# Patient Record
Sex: Female | Born: 1940 | Race: White | Hispanic: No | Smoking: Former smoker
Health system: Southern US, Community
[De-identification: ages and names within clinical notes are randomized; demographics above are authoritative.]

## PROBLEM LIST (undated history)

## (undated) DIAGNOSIS — IMO0001 Reserved for inherently not codable concepts without codable children: Secondary | ICD-10-CM

## (undated) DIAGNOSIS — J449 Chronic obstructive pulmonary disease, unspecified: Secondary | ICD-10-CM

## (undated) DIAGNOSIS — E785 Hyperlipidemia, unspecified: Secondary | ICD-10-CM

## (undated) DIAGNOSIS — I10 Essential (primary) hypertension: Secondary | ICD-10-CM

## (undated) DIAGNOSIS — IMO0002 Reserved for concepts with insufficient information to code with codable children: Secondary | ICD-10-CM

## (undated) DIAGNOSIS — I739 Peripheral vascular disease, unspecified: Secondary | ICD-10-CM

## (undated) DIAGNOSIS — I5022 Chronic systolic (congestive) heart failure: Secondary | ICD-10-CM

## (undated) DIAGNOSIS — R7611 Nonspecific reaction to tuberculin skin test without active tuberculosis: Secondary | ICD-10-CM

## (undated) DIAGNOSIS — E119 Type 2 diabetes mellitus without complications: Secondary | ICD-10-CM

## (undated) DIAGNOSIS — R911 Solitary pulmonary nodule: Secondary | ICD-10-CM

## (undated) DIAGNOSIS — I2510 Atherosclerotic heart disease of native coronary artery without angina pectoris: Secondary | ICD-10-CM

## (undated) DIAGNOSIS — H269 Unspecified cataract: Secondary | ICD-10-CM

## (undated) DIAGNOSIS — C801 Malignant (primary) neoplasm, unspecified: Secondary | ICD-10-CM

## (undated) DIAGNOSIS — E875 Hyperkalemia: Secondary | ICD-10-CM

## (undated) DIAGNOSIS — C3490 Malignant neoplasm of unspecified part of unspecified bronchus or lung: Secondary | ICD-10-CM

## (undated) DIAGNOSIS — D494 Neoplasm of unspecified behavior of bladder: Secondary | ICD-10-CM

## (undated) DIAGNOSIS — Z973 Presence of spectacles and contact lenses: Secondary | ICD-10-CM

## (undated) DIAGNOSIS — Z8719 Personal history of other diseases of the digestive system: Secondary | ICD-10-CM

## (undated) DIAGNOSIS — I255 Ischemic cardiomyopathy: Secondary | ICD-10-CM

## (undated) DIAGNOSIS — C679 Malignant neoplasm of bladder, unspecified: Secondary | ICD-10-CM

## (undated) HISTORY — DX: Chronic systolic (congestive) heart failure: I50.22

## (undated) HISTORY — DX: Ischemic cardiomyopathy: I25.5

## (undated) HISTORY — DX: Atherosclerotic heart disease of native coronary artery without angina pectoris: I25.10

## (undated) HISTORY — DX: Solitary pulmonary nodule: R91.1

## (undated) HISTORY — PX: WRIST SURGERY: SHX841

## (undated) HISTORY — PX: ABDOMINAL HYSTERECTOMY: SHX81

## (undated) HISTORY — DX: Chronic obstructive pulmonary disease, unspecified: J44.9

## (undated) HISTORY — PX: TONSILLECTOMY: SUR1361

## (undated) HISTORY — PX: OTHER SURGICAL HISTORY: SHX169

## (undated) HISTORY — DX: Hyperlipidemia, unspecified: E78.5

## (undated) HISTORY — DX: Reserved for inherently not codable concepts without codable children: IMO0001

## (undated) HISTORY — DX: Hyperkalemia: E87.5

## (undated) HISTORY — DX: Malignant (primary) neoplasm, unspecified: C80.1

## (undated) HISTORY — DX: Malignant neoplasm of bladder, unspecified: C67.9

## (undated) HISTORY — PX: CHOLECYSTECTOMY: SHX55

## (undated) HISTORY — DX: Peripheral vascular disease, unspecified: I73.9

## (undated) HISTORY — DX: Reserved for concepts with insufficient information to code with codable children: IMO0002

## (undated) HISTORY — DX: Nonspecific reaction to tuberculin skin test without active tuberculosis: R76.11

## (undated) HISTORY — PX: HAMMER TOE SURGERY: SHX385

## (undated) HISTORY — PX: APPENDECTOMY: SHX54

## (undated) HISTORY — PX: HIATAL HERNIA REPAIR: SHX195

## (undated) HISTORY — PX: CARDIAC CATHETERIZATION: SHX172

## (undated) HISTORY — PX: COLONOSCOPY: SHX174

---
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
## 1991-02-24 HISTORY — PX: LUMBAR DISC SURGERY: SHX700

## 2000-11-08 ENCOUNTER — Emergency Department (HOSPITAL_COMMUNITY): Admission: EM | Admit: 2000-11-08 | Discharge: 2000-11-08 | Payer: Self-pay | Admitting: *Deleted

## 2000-11-17 ENCOUNTER — Ambulatory Visit (HOSPITAL_COMMUNITY): Admission: RE | Admit: 2000-11-17 | Discharge: 2000-11-17 | Payer: Self-pay | Admitting: Internal Medicine

## 2000-11-17 ENCOUNTER — Encounter: Payer: Self-pay | Admitting: Internal Medicine

## 2001-02-25 ENCOUNTER — Encounter: Payer: Self-pay | Admitting: Podiatry

## 2001-02-28 ENCOUNTER — Ambulatory Visit (HOSPITAL_COMMUNITY): Admission: RE | Admit: 2001-02-28 | Discharge: 2001-02-28 | Payer: Self-pay | Admitting: Podiatry

## 2011-10-02 ENCOUNTER — Encounter: Payer: Self-pay | Admitting: Internal Medicine

## 2011-10-02 DIAGNOSIS — Z0000 Encounter for general adult medical examination without abnormal findings: Secondary | ICD-10-CM | POA: Insufficient documentation

## 2011-10-07 ENCOUNTER — Other Ambulatory Visit: Payer: Self-pay | Admitting: Internal Medicine

## 2011-10-07 ENCOUNTER — Ambulatory Visit (INDEPENDENT_AMBULATORY_CARE_PROVIDER_SITE_OTHER)
Admission: RE | Admit: 2011-10-07 | Discharge: 2011-10-07 | Disposition: A | Discharge status: Home / Self Care | Payer: Medicare Other | Source: Ambulatory Visit | Attending: Internal Medicine | Admitting: Internal Medicine

## 2011-10-07 ENCOUNTER — Encounter: Payer: Self-pay | Admitting: Internal Medicine

## 2011-10-07 ENCOUNTER — Other Ambulatory Visit (INDEPENDENT_AMBULATORY_CARE_PROVIDER_SITE_OTHER): Payer: Medicare Other

## 2011-10-07 ENCOUNTER — Ambulatory Visit (INDEPENDENT_AMBULATORY_CARE_PROVIDER_SITE_OTHER): Payer: Medicare Other | Admitting: Internal Medicine

## 2011-10-07 VITALS — BP 140/88 | HR 81 | Temp 97.4°F | Ht 62.0 in | Wt 122.2 lb

## 2011-10-07 DIAGNOSIS — J309 Allergic rhinitis, unspecified: Secondary | ICD-10-CM | POA: Insufficient documentation

## 2011-10-07 DIAGNOSIS — R059 Cough, unspecified: Secondary | ICD-10-CM

## 2011-10-07 DIAGNOSIS — Z0000 Encounter for general adult medical examination without abnormal findings: Secondary | ICD-10-CM

## 2011-10-07 DIAGNOSIS — J449 Chronic obstructive pulmonary disease, unspecified: Secondary | ICD-10-CM | POA: Insufficient documentation

## 2011-10-07 DIAGNOSIS — F17200 Nicotine dependence, unspecified, uncomplicated: Secondary | ICD-10-CM

## 2011-10-07 DIAGNOSIS — R7611 Nonspecific reaction to tuberculin skin test without active tuberculosis: Secondary | ICD-10-CM | POA: Insufficient documentation

## 2011-10-07 DIAGNOSIS — Z87891 Personal history of nicotine dependence: Secondary | ICD-10-CM | POA: Insufficient documentation

## 2011-10-07 DIAGNOSIS — IMO0001 Reserved for inherently not codable concepts without codable children: Secondary | ICD-10-CM

## 2011-10-07 DIAGNOSIS — Z23 Encounter for immunization: Secondary | ICD-10-CM

## 2011-10-07 DIAGNOSIS — R05 Cough: Secondary | ICD-10-CM

## 2011-10-07 LAB — CBC WITH DIFFERENTIAL/PLATELET
Basophils Absolute: 0.1 10*3/uL (ref 0.0–0.1)
Eosinophils Relative: 1.6 % (ref 0.0–5.0)
HCT: 46.2 % — ABNORMAL HIGH (ref 36.0–46.0)
Lymphocytes Relative: 31.1 % (ref 12.0–46.0)
Monocytes Relative: 6.6 % (ref 3.0–12.0)
Neutrophils Relative %: 60.2 % (ref 43.0–77.0)
Platelets: 265 10*3/uL (ref 150.0–400.0)
RDW: 13.9 % (ref 11.5–14.6)
WBC: 13.8 10*3/uL — ABNORMAL HIGH (ref 4.5–10.5)

## 2011-10-07 LAB — HEPATIC FUNCTION PANEL
ALT: 12 U/L (ref 0–35)
AST: 21 U/L (ref 0–37)
Albumin: 4.2 g/dL (ref 3.5–5.2)
Total Protein: 8 g/dL (ref 6.0–8.3)

## 2011-10-07 LAB — URINALYSIS, ROUTINE W REFLEX MICROSCOPIC
Ketones, ur: NEGATIVE
Leukocytes, UA: NEGATIVE
Specific Gravity, Urine: 1.01 (ref 1.000–1.030)
Urobilinogen, UA: 0.2 (ref 0.0–1.0)

## 2011-10-07 LAB — BASIC METABOLIC PANEL
BUN: 15 mg/dL (ref 6–23)
CO2: 28 mEq/L (ref 19–32)
GFR: 90.39 mL/min (ref >60.00)
Glucose, Bld: 173 mg/dL — ABNORMAL HIGH (ref 70–99)
Potassium: 4.8 mEq/L (ref 3.5–5.1)

## 2011-10-07 LAB — TSH: TSH: 2.8 u[IU]/mL (ref 0.35–5.50)

## 2011-10-07 LAB — LIPID PANEL: HDL: 55.4 mg/dL (ref >39.00)

## 2011-10-07 MED ORDER — METFORMIN HCL 500 MG PO TABS: 500.0000 mg | ORAL_TABLET | Freq: Two times a day (BID) | ORAL | Status: DC

## 2011-10-07 MED ORDER — AZITHROMYCIN 250 MG PO TABS: ORAL_TABLET | ORAL | Status: AC

## 2011-10-07 MED ORDER — ASPIRIN 81 MG PO TBEC: 81.0000 mg | DELAYED_RELEASE_TABLET | Freq: Every day | ORAL | Status: DC

## 2011-10-07 NOTE — Patient Instructions (Addendum)
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  Otherwise, you will receive a letter about your results with an explanation. Please return in 1 year for your yearly visit, or sooner if needed, with Lab testing done 3-5 days before

## 2011-10-07 NOTE — Assessment & Plan Note (Signed)

## 2011-10-07 NOTE — Progress Notes (Signed)
Subjective:    Patient ID: Tonya Valdez, female    DOB: 1940-06-12, 71 y.o.   MRN: [id number]  HPI  Here for wellness and f/u;  Overall doing ok;  Pt denies CP, worsening SOB, DOE, wheezing, orthopnea, PND, worsening LE edema, palpitations, dizziness or syncope.  Pt denies neurological change such as new Headache, facial or extremity weakness.  Pt denies polydipsia, polyuria, or low sugar symptoms. Pt states overall good compliance with treatment and medications, good tolerability, and trying to follow lower cholesterol diet.  Pt denies worsening depressive symptoms, suicidal ideation or panic. No fever, wt loss, night sweats, loss of appetite, or other constitutional symptoms.  Pt states good ability with ADL's, low fall risk, home safety reviewed and adequate, no significant changes in hearing or vision, and occasionally active with exercise.  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  Mother had MI, has seen [facility]. Past Medical History  Diagnosis Date  . Positive TB test   . Allergy    Past Surgical History  Procedure Date  . Left foot surgury jan 2003    podiatry  . Gall bladder 1964  . Appendectomy 1964  . Tonsillectomy 1959  . Back surgery 1993  . Hiatal hernia repair 1964    reports that she has been smoking.  She has never used smokeless tobacco. She reports that she drinks alcohol. She reports that she does not use illicit drugs. family history includes Cancer in her other; Diabetes in her other; Heart disease in her other; Hypertension in her other; and Kidney disease in her other. No Known Allergies No current outpatient prescriptions on file prior to visit.   Review of Systems Review of Systems  Constitutional: Negative for diaphoresis, activity change, appetite change and unexpected weight change.  HENT: Negative for hearing loss, ear pain, facial swelling, mouth sores and neck stiffness.     Eyes: Negative for pain, redness and visual disturbance.  Respiratory: Negative for shortness of breath and wheezing.   Cardiovascular: Negative for chest pain and palpitations.  Gastrointestinal: Negative for diarrhea, blood in stool, abdominal distention and rectal pain.  Genitourinary: Negative for hematuria, flank pain and decreased urine volume.  Musculoskeletal: Negative for myalgias and joint swelling.  Skin: Negative for color change and wound.  Neurological: Negative for syncope and numbness.  Hematological: Negative for adenopathy.  Psychiatric/Behavioral: Negative for hallucinations, self-injury, decreased concentration and agitation.     Objective:   Physical Exam BP 140/88  Pulse 81  Temp 97.4 F (36.3 C) (Oral)  Ht 5\' 2"  (1.575 m)  Wt 122 lb 4 oz (55.452 kg)  BMI 22.36 kg/m2  SpO2 96% Physical Exam  VS noted Constitutional: Pt is oriented to person, place, and time. Appears well-developed and well-nourished.  Head: Normocephalic and atraumatic.  Right Ear: External ear normal.  Left Ear: External ear normal.  Nose: Nose normal.  Mouth/Throat: Oropharynx is clear and moist.  Eyes: Conjunctivae and EOM are normal. Pupils are equal, round, and reactive to light.  Neck: Normal range of motion. Neck supple. No JVD present. No tracheal deviation present.  Cardiovascular: Normal rate, regular rhythm, normal heart sounds and intact distal pulses.   Pulmonary/Chest: Effort normal and breath sounds normal.  Abdominal: Soft. Bowel sounds are normal. There is no tenderness.  Musculoskeletal: Normal range of motion. Exhibits no edema.  Lymphadenopathy:  Has no cervical adenopathy.  Neurological: Pt is alert and oriented to  person, place, and time. Pt has normal reflexes. No cranial nerve deficit. Motor/dtr/gait intact Skin: Skin is warm and dry. No rash noted.  Psychiatric:  Has  normal mood and affect. Behavior is normal.     Assessment & Plan:

## 2011-10-07 NOTE — Assessment & Plan Note (Signed)
?   Related to alleriges and post nasal gtt? - for allegra otc prn, but also cxr today

## 2011-10-10 ENCOUNTER — Encounter: Payer: Self-pay | Admitting: Internal Medicine

## 2011-10-10 DIAGNOSIS — E119 Type 2 diabetes mellitus without complications: Secondary | ICD-10-CM | POA: Insufficient documentation

## 2011-10-10 NOTE — Assessment & Plan Note (Signed)
Urged to quit 

## 2011-10-16 ENCOUNTER — Telehealth: Payer: Self-pay | Admitting: Internal Medicine

## 2011-10-16 NOTE — Telephone Encounter (Signed)
Called the patient back and she wanted to  to go somewhere else for Diabetic education classes. INformed would be ok as long as she goes to the classes.

## 2011-11-04 ENCOUNTER — Telehealth: Payer: Self-pay | Admitting: Internal Medicine

## 2011-11-04 ENCOUNTER — Encounter: Payer: Medicare Other | Attending: Internal Medicine | Admitting: Dietician

## 2011-11-04 ENCOUNTER — Encounter: Payer: Self-pay | Admitting: Gastroenterology

## 2011-11-04 ENCOUNTER — Other Ambulatory Visit: Payer: Self-pay

## 2011-11-04 ENCOUNTER — Encounter: Payer: Self-pay | Admitting: Dietician

## 2011-11-04 VITALS — Ht 62.0 in | Wt 116.6 lb

## 2011-11-04 DIAGNOSIS — E119 Type 2 diabetes mellitus without complications: Secondary | ICD-10-CM | POA: Insufficient documentation

## 2011-11-04 DIAGNOSIS — Z713 Dietary counseling and surveillance: Secondary | ICD-10-CM | POA: Insufficient documentation

## 2011-11-04 MED ORDER — ACCU-CHEK FASTCLIX LANCETS MISC: 1.0000 | Freq: Four times a day (QID) | Status: DC

## 2011-11-04 MED ORDER — GLUCOSE BLOOD VI STRP: ORAL_STRIP | Status: DC

## 2011-11-04 MED ORDER — ACCU-CHEK SMARTVIEW CONTROL VI LIQD: 1.0000 | Status: DC | PRN

## 2011-11-04 NOTE — Progress Notes (Signed)
  Medical Nutrition Therapy:  Appt start time: 0800 end time:  0900.   Assessment:  Primary concerns today: New onset DM type 2.  Is concerned that she had no S/S of the DM until her lab work came back.  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 Enjoys her work and continues to enjoy traveling when possible. Wants to monitor blood glucose levels.  Will approach MD for this process.  Provided an Accu-Chek Smartview meter kit : lot=[id number] EXP=06/04/2014 with the Accu-Chek Softclix lancets/lancing device LOT= [id number] EXP= 2017/02. Has seen an RD in [facility], [place] who has placed her on a carb restricted diet.  Diet prescription was for 30-45 gm of CHO for meals and 15 gm for snacks and a calorie level at 1500 calories.  Of concern to me is the fact that she is losing weight.  Today she is at 116.6 lb with a loss of 5.6 lb since her MD appointment on 10/07/2011.  She notes that her cholesterol is slightly elevated and she is about limiting her fat intake.  She is missing some calories and at her recall she  is most days probably at 1200-1300 calories.  Her current  BMI is at 21.4 and I would like to see her stay there.  MEDICATIONS: Medication review completed   DIETARY INTAKE:  Usual eating pattern includes generally 3 meals and 1-2 snacks per day.  Everyday foods include meats, vegetables, fruits, some starchse.  Avoided foods include concentrated sweets.    24-hr recall: Work Day  B ( AM): 11:00 Coffee then frozen pancakes and sugar free syrup, OR bacon, 1-2 slice and egg beaters, and a pancake.   Snk ( AM): 1:00 =/- snack   L ( PM): 2: 30 maybe meal, chicken salad crackers 5 or veggies ( bussel sprouts or broccoli, or sweet potato or baked potato.  or chef salad. Snk ( PM): 12:00 popcorn, individual bag 100 calories or pack of the Lance crackers D ( PM): 6:3:00 shrimp, fried, salad part of baked potato and 2 biscuits.  Water Snk (  PM): 4:00 popcorn (rest) or fruit (apple/orange/1/2 of banana) aiming for 15 gm. Beverages: coffee, water, diet soda. On the day off will have the 3 meal pattern and breakfast will be an omelett, and maybe grits for the breakfast meal.   Usual physical activity: No set pattern for the exercise.  Estimated energy needs:  HT: 62 in  WT: 116.6 lb  BMI:  21.4 kg/m2   1500 calories 30-45 gm per meal carbohydrates 110-115 g protein 40-42 g fat  Progress Towards Goal(s):  In progress.   Nutritional Diagnosis:  [place]-2.1 Inpaired nutrition utilization As related to glucose.  As evidenced by diagnosis of type 2 diabetes with A1C at 8.3%..    Intervention:  Nutrition brief review of nutrition and reinforcement of previous content..  Handouts given during visit include:  Living Well with Diabetes  Controlling Blood Glucose  Monitoring/Evaluation:  Dietary intake, exercise, blood glucose levels, and body weight in 8-12 weeks.  She is to call with questions and for follow-up appointment.

## 2011-11-04 NOTE — Telephone Encounter (Signed)
Patient informed prescriptions requested have been sent in. 

## 2011-11-04 NOTE — Telephone Encounter (Signed)
Needs rx called into pharmacy, lancets for Accu-chek softclix and needs the solution for the meter. Please call into [facility]

## 2011-11-06 ENCOUNTER — Telehealth: Payer: Self-pay | Admitting: Internal Medicine

## 2011-11-06 NOTE — Telephone Encounter (Signed)
Faxed rx request to pharmacy.

## 2011-11-06 NOTE — Telephone Encounter (Signed)
The pt called the triage line hoping to get a shingles rx sent to [facility]. Thanks!

## 2011-11-06 NOTE — Telephone Encounter (Signed)
Ok - rx done hardcopy to [facility]

## 2011-11-30 ENCOUNTER — Ambulatory Visit (AMBULATORY_SURGERY_CENTER): Payer: Medicare Other | Admitting: *Deleted

## 2011-11-30 VITALS — Ht 62.0 in | Wt 116.2 lb

## 2011-11-30 DIAGNOSIS — Z1211 Encounter for screening for malignant neoplasm of colon: Secondary | ICD-10-CM

## 2011-11-30 MED ORDER — MOVIPREP 100 G PO SOLR: ORAL | Status: DC

## 2011-12-14 ENCOUNTER — Encounter: Payer: Medicare Other | Admitting: Gastroenterology

## 2011-12-21 ENCOUNTER — Encounter: Payer: Medicare Other | Admitting: Gastroenterology

## 2011-12-30 ENCOUNTER — Encounter: Payer: Medicare Other | Admitting: Gastroenterology

## 2012-01-01 ENCOUNTER — Ambulatory Visit (AMBULATORY_SURGERY_CENTER): Payer: Medicare Other | Admitting: Gastroenterology

## 2012-01-01 ENCOUNTER — Encounter: Payer: Self-pay | Admitting: Gastroenterology

## 2012-01-01 VITALS — BP 157/62 | HR 66 | Temp 96.7°F | Resp 14 | Ht 62.0 in | Wt 116.0 lb

## 2012-01-01 DIAGNOSIS — Z1211 Encounter for screening for malignant neoplasm of colon: Secondary | ICD-10-CM

## 2012-01-01 DIAGNOSIS — K5730 Diverticulosis of large intestine without perforation or abscess without bleeding: Secondary | ICD-10-CM

## 2012-01-01 DIAGNOSIS — D126 Benign neoplasm of colon, unspecified: Secondary | ICD-10-CM

## 2012-01-01 MED ORDER — SODIUM CHLORIDE 0.9 % IV SOLN: 500.0000 mL | INTRAVENOUS | Status: DC

## 2012-01-01 NOTE — Patient Instructions (Addendum)
YOU HAD AN ENDOSCOPIC PROCEDURE TODAY AT THE [facility]: Refer to the procedure report that was given to you for any specific questions about what was found during the examination.  If the procedure report does not answer your questions, please call your gastroenterologist to clarify.  If you requested that your care partner not be given the details of your procedure findings, then the procedure report has been included in a sealed envelope for you to review at your convenience later.  YOU SHOULD EXPECT: Some feelings of bloating in the abdomen. Passage of more gas than usual.  Walking can help get rid of the air that was put into your GI tract during the procedure and reduce the bloating. If you had a lower endoscopy (such as a colonoscopy or flexible sigmoidoscopy) you may notice spotting of blood in your stool or on the toilet paper. If you underwent a bowel prep for your procedure, then you may not have a normal bowel movement for a few days.  DIET: Your first meal following the procedure should be a light meal and then it is ok to progress to your normal diet.  A half-sandwich or bowl of soup is an example of a good first meal.  Heavy or fried foods are harder to digest and may make you feel nauseous or bloated.  Likewise meals heavy in dairy and vegetables can cause extra gas to form and this can also increase the bloating.  Drink plenty of fluids but you should avoid alcoholic beverages for 24 hours.  ACTIVITY: Your care partner should take you home directly after the procedure.  You should plan to take it easy, moving slowly for the rest of the day.  You can resume normal activity the day after the procedure however you should NOT DRIVE or use heavy machinery for 24 hours (because of the sedation medicines used during the test).    SYMPTOMS TO REPORT IMMEDIATELY: A gastroenterologist can be reached at any hour.  During normal business hours, 8:30 AM to 5:00 PM Monday through Friday,  call [phone].  After hours and on weekends, please call the GI answering service at [phone] who will take a message and have the physician on call contact you.   Following lower endoscopy (colonoscopy or flexible sigmoidoscopy):  Excessive amounts of blood in the stool  Significant tenderness or worsening of abdominal pains  Swelling of the abdomen that is new, acute  Fever of 100F or higher   FOLLOW UP: If any biopsies were taken you will be contacted by phone or by letter within the next 1-3 weeks.  Call your gastroenterologist if you have not heard about the biopsies in 3 weeks.  Our staff will call the home number listed on your records the next business day following your procedure to check on you and address any questions or concerns that you may have at that time regarding the information given to you following your procedure. This is a courtesy call and so if there is no answer at the home number and we have not heard from you through the emergency physician on call, we will assume that you have returned to your regular daily activities without incident.  SIGNATURES/CONFIDENTIALITY: You and/or your care partner have signed paperwork which will be entered into your electronic medical record.  These signatures attest to the fact that that the information above on your After Visit Summary has been reviewed and is understood.  Full responsibility of the confidentiality of   this discharge information lies with you and/or your care-partner.   Resume medications. Information given on polyps,diverticulosis and high fiber diet with discharge instructions. 

## 2012-01-01 NOTE — Progress Notes (Signed)
Patient did not experience any of the following events: a burn prior to discharge; a fall within the facility; wrong site/side/patient/procedure/implant event; or a hospital transfer or hospital admission upon discharge from the facility. (G8907) Patient did not have preoperative order for IV antibiotic SSI prophylaxis. (G8918)  

## 2012-01-01 NOTE — Op Note (Signed)
[facility] [address] [place], [address]   COLONOSCOPY PROCEDURE REPORT  PATIENT: Natika, Heyen  MR#: [id number] BIRTHDATE: 06/30/1940 , 71  yrs. old GENDER: Female ENDOSCOPIST: Mardella Layman, MD, [facility] REFERRED BY: PROCEDURE DATE:  01/01/2012 PROCEDURE:   Colonoscopy with snare polypectomy ASA CLASS:   Class II INDICATIONS:average risk patient for colon cancer. MEDICATIONS: propofol (Diprivan) 200mg  IV  DESCRIPTION OF PROCEDURE:   After the risks and benefits and of the procedure were explained, informed consent was obtained.  A digital rectal exam revealed no abnormalities of the rectum.    The LB [id number]  endoscope was introduced through the anus and advanced to the cecum, which was identified by both the appendix and ileocecal valve .  The quality of the prep was good, using MoviPrep .  The instrument was then slowly withdrawn as the colon was fully examined.     COLON FINDINGS: Moderate diverticulosis was noted in the descending colon and sigmoid colon.   Two polypoid shaped pedunculated polyps ranging between 5-38mm in size were found in the descending colon and sigmoid colon.  A polypectomy was performed using snare cautery.  The resection was complete and the polyp tissue was partially retrieved.     Retroflexed views revealed internal hemorrhoids.     The scope was then withdrawn from the patient and the procedure completed.  COMPLICATIONS: There were no complications. ENDOSCOPIC IMPRESSION: 1.   Moderate diverticulosis was noted in the descending colon and sigmoid colon 2.   Two pedunculated polyps ranging between 5-58mm in size were found in the descending colon and sigmoid colon; polypectomy was performed using snare cautery  RECOMMENDATIONS: 1.  Await pathology results 2.  High fiber diet 3.  Given your age, you will not need another colonoscopy for colon cancer screening or polyp surveillance.  These types of  tests usually stop around the age 10. 4.  Continue surveillance   REPEAT EXAM:  cc:  _______________________________ eSigned:  Mardella Layman, MD, [facility] 01/01/2012 2:33 PM     PATIENT NAME:  Maryline, Schmittou MR#: [id number]

## 2012-01-04 ENCOUNTER — Telehealth: Payer: Self-pay | Admitting: *Deleted

## 2012-01-04 NOTE — Telephone Encounter (Signed)
No answer, left message to call if questions or concerns. 

## 2012-01-06 ENCOUNTER — Ambulatory Visit: Payer: Medicare Other | Admitting: Internal Medicine

## 2012-01-08 ENCOUNTER — Encounter: Payer: Self-pay | Admitting: Gastroenterology

## 2012-01-11 ENCOUNTER — Other Ambulatory Visit (INDEPENDENT_AMBULATORY_CARE_PROVIDER_SITE_OTHER): Payer: Medicare Other

## 2012-01-11 ENCOUNTER — Telehealth: Payer: Self-pay | Admitting: *Deleted

## 2012-01-11 DIAGNOSIS — Z79899 Other long term (current) drug therapy: Secondary | ICD-10-CM

## 2012-01-11 DIAGNOSIS — Z0000 Encounter for general adult medical examination without abnormal findings: Secondary | ICD-10-CM

## 2012-01-11 LAB — HEMOGLOBIN A1C: Hgb A1c MFr Bld: 6.9 % — ABNORMAL HIGH (ref 4.6–6.5)

## 2012-01-11 LAB — HEPATIC FUNCTION PANEL
AST: 27 U/L (ref 0–37)
Albumin: 4 g/dL (ref 3.5–5.2)
Total Protein: 7.1 g/dL (ref 6.0–8.3)

## 2012-01-11 LAB — BASIC METABOLIC PANEL
BUN: 16 mg/dL (ref 6–23)
Calcium: 9.4 mg/dL (ref 8.4–10.5)
GFR: 73.81 mL/min (ref >60.00)
Glucose, Bld: 133 mg/dL — ABNORMAL HIGH (ref 70–99)

## 2012-01-11 LAB — LIPID PANEL
Cholesterol: 185 mg/dL (ref 0–200)
HDL: 44.9 mg/dL (ref >39.00)
VLDL: 36.8 mg/dL (ref 0.0–40.0)

## 2012-01-11 NOTE — Telephone Encounter (Signed)
Pt was told in 10/07/2011 letter to come back for 3 month f/u and labs-what labwork does she need done.  She is here today for labwork.

## 2012-01-11 NOTE — Telephone Encounter (Signed)
Put lab in °

## 2012-01-15 ENCOUNTER — Encounter: Payer: Self-pay | Admitting: Internal Medicine

## 2012-01-15 ENCOUNTER — Ambulatory Visit (INDEPENDENT_AMBULATORY_CARE_PROVIDER_SITE_OTHER)
Admission: RE | Admit: 2012-01-15 | Discharge: 2012-01-15 | Disposition: A | Discharge status: Home / Self Care | Payer: Medicare Other | Source: Ambulatory Visit | Attending: Internal Medicine | Admitting: Internal Medicine

## 2012-01-15 ENCOUNTER — Ambulatory Visit: Payer: Medicare Other | Admitting: Internal Medicine

## 2012-01-15 ENCOUNTER — Ambulatory Visit (INDEPENDENT_AMBULATORY_CARE_PROVIDER_SITE_OTHER): Payer: Medicare Other | Admitting: Internal Medicine

## 2012-01-15 VITALS — BP 170/70 | HR 76 | Temp 97.8°F | Ht 62.0 in | Wt 109.0 lb

## 2012-01-15 DIAGNOSIS — R634 Abnormal weight loss: Secondary | ICD-10-CM

## 2012-01-15 DIAGNOSIS — R030 Elevated blood-pressure reading, without diagnosis of hypertension: Secondary | ICD-10-CM

## 2012-01-15 DIAGNOSIS — J449 Chronic obstructive pulmonary disease, unspecified: Secondary | ICD-10-CM

## 2012-01-15 DIAGNOSIS — J4489 Other specified chronic obstructive pulmonary disease: Secondary | ICD-10-CM

## 2012-01-15 DIAGNOSIS — IMO0001 Reserved for inherently not codable concepts without codable children: Secondary | ICD-10-CM

## 2012-01-15 DIAGNOSIS — Z0000 Encounter for general adult medical examination without abnormal findings: Secondary | ICD-10-CM

## 2012-01-15 NOTE — Assessment & Plan Note (Signed)
Currently not taking any OHA, has recent wt loss and a1c improved, Continue all other medications as before Lab Results  Component Value Date   HGBA1C 6.9* 01/11/2012

## 2012-01-15 NOTE — Assessment & Plan Note (Signed)
stable overall by hx and exam, most recent data reviewed with pt, and pt to continue medical treatment as before SpO2 Readings from Last 3 Encounters:  01/15/12 98%  01/01/12 99%  10/07/11 96%

## 2012-01-15 NOTE — Progress Notes (Signed)
Subjective:    Patient ID: Tonya Valdez, female    DOB: 07/03/1940, 71 y.o.   MRN: [id number]  HPI  Here to f/u; overall doing ok,  Pt denies chest pain, increased sob or doe, wheezing, orthopnea, PND, increased LE swelling, palpitations, dizziness or syncope.  Pt denies new neurological symptoms such as new headache, or facial or extremity weakness or numbness   Pt denies polydipsia, polyuria, or low sugar symptoms such as weakness or confusion improved with po intake.  Pt states overall good compliance with meds, trying to follow lower cholesterol, diabetic diet, wt overall stable but little exercise however.  Could not tolerate metformin due to dizziness, fatigue.  Has had significant wt loss with better diet from 122 to 109 lbs, trying hard to avoid carbs after attending her nutrition class..  Not tobacco use, no increased ETOH, no GI symtpoms - Denies worsening reflux, dysphagia, abd pain, n/v, bowel change or blood.  Denies worsening depressive symptoms, suicidal ideation, or panic.  BP at home has been much better than today, very hesitant to consider further tx today.   Still smoking occasional cig. Past Medical History  Diagnosis Date  . Positive TB test   . Allergy   . COPD (chronic obstructive pulmonary disease) 10/07/2011  . Type II or unspecified type diabetes mellitus without mention of complication, uncontrolled 10/10/2011  . Cataract    Past Surgical History  Procedure Date  . Left foot surgury jan 2003    podiatry  . Gall bladder 1964  . Appendectomy 1964  . Tonsillectomy 1959  . Back surgery 1993  . Hiatal hernia repair 1964  . Cholecystectomy     reports that she has been smoking Cigarettes.  She has a 12.5 pack-year smoking history. She has never used smokeless tobacco. She reports that she drinks alcohol. She reports that she does not use illicit drugs. family history includes Cancer in her other; Cervical cancer in her mother; Colon cancer in her father; Diabetes in her  other; Heart disease in her other; Hypertension in her other; Kidney disease in her other; and Lung cancer (age of onset:54) in her father. Allergies  Allergen Reactions  . Metformin And Related Other (See Comments)    Dizzy and fatigue, lower appetitie  . Rice Nausea And Vomiting   Current Outpatient Prescriptions on File Prior to Visit  Medication Sig Dispense Refill  . ACCU-CHEK FASTCLIX LANCETS MISC Inject 1 each as directed 4 (four) times daily. Use as directed four times daily.  Diagnosis code 250.02Use as directed four times daily.  Diagnosis code 250.02  102 each  11  . aspirin 81 MG EC tablet Take 1 tablet (81 mg total) by mouth daily. Swallow whole.  30 tablet  12  . Blood Glucose Calibration (ACCU-CHEK SMARTVIEW CONTROL) LIQD 1 Bottle by Other route as needed. Use as directed Use as directed  1 each  0  . glucose blood (ACCU-CHEK SMARTVIEW) test strip Test four times daily as instructed.  Diagnosis  250.02  102 each  11  . metFORMIN (GLUCOPHAGE) 500 MG tablet Take 1 tablet (500 mg total) by mouth 2 (two) times daily with a meal.  60 tablet  11   Review of Systems  Constitutional: Negative for diaphoresis and unexpected weight change.  HENT: Negative for tinnitus.   Eyes: Negative for photophobia and visual disturbance.  Respiratory: Negative for choking and stridor.   Gastrointestinal: Negative for vomiting and blood in stool.  Genitourinary: Negative for hematuria and decreased  urine volume.  Musculoskeletal: Negative for gait problem.  Skin: Negative for color change and wound.  Neurological: Negative for tremors and numbness.  Psychiatric/Behavioral: Negative for decreased concentration. The patient is not hyperactive.      Objective:   Physical Exam BP 170/70  Pulse 76  Temp 97.8 F (36.6 C) (Oral)  Ht 5\' 2"  (1.575 m)  Wt 109 lb (49.442 kg)  BMI 19.94 kg/m2  SpO2 98% Physical Exam  VS noted Constitutional: Pt appears thin for age HENT: Head: Normocephalic.    Right Ear: External ear normal.  Left Ear: External ear normal.  Eyes: Conjunctivae and EOM are normal. Pupils are equal, round, and reactive to light.  Neck: Normal range of motion. Neck supple.  Cardiovascular: Normal rate and regular rhythm.   Pulmonary/Chest: Effort normal and breath sounds normal.  Abd:  Soft, NT, non-distended, + BS Neurological: Pt is alert. Not confused  Skin: Skin is warm. No erythema.  Psychiatric: Pt behavior is normal. Thought content normal.     Assessment & Plan:

## 2012-01-15 NOTE — Assessment & Plan Note (Signed)
Most likely seems to related to diligent dietary stringence with respect to cards, for f/u cxr however with hx of smoking, declines further labs today

## 2012-01-15 NOTE — Patient Instructions (Addendum)
OK to stay off medication for Diabetes for now Continue all other medications as before Please go to XRAY in the Basement for the x-ray test You will be contacted by phone if any changes need to be made immediately.  Otherwise, you will receive a letter about your results with an explanation, but please check with MyChart first. Thank you for enrolling in MyChart. Please follow the instructions below to securely access your online medical record. MyChart allows you to send messages to your doctor, view your test results, renew your prescriptions, schedule appointments, and more. To Log into MyChart, please go to [email or web address], and your Username is:  [email or web address] Please continue to monitor your blood pressure at home on a regular basis; your goal is to be less than 140/90 Please stop smoking completely Please return in 6 mo with Lab testing done 3-5 days before, or sooner if needed

## 2012-01-15 NOTE — Assessment & Plan Note (Signed)
Rather high today, pt adamant BP at home is far better, to cont monitor, decliens change in tx today

## 2012-01-27 ENCOUNTER — Encounter: Payer: Self-pay | Admitting: Dietician

## 2012-01-27 ENCOUNTER — Encounter: Payer: Medicare Other | Attending: Internal Medicine | Admitting: Dietician

## 2012-01-27 VITALS — Ht 62.0 in | Wt 106.5 lb

## 2012-01-27 DIAGNOSIS — E119 Type 2 diabetes mellitus without complications: Secondary | ICD-10-CM | POA: Insufficient documentation

## 2012-01-27 DIAGNOSIS — Z713 Dietary counseling and surveillance: Secondary | ICD-10-CM | POA: Insufficient documentation

## 2012-01-27 NOTE — Progress Notes (Signed)
  Medical Nutrition Therapy:  Appt start time: 1230 end time:  1300.  Assessment:  Primary concerns today: Has continued to lose weight.  Currently at 109 lbs.  This is a weight that she feels comfortable at and is not anxious to lose more weight.  This is a total of 7.6 lb weight loss since her appointment on 11/04/2011.  Her goal is to maintain her current weight. She saw Okey Regal the RD in [place] and has been trying to increase her intake of nuts and other healthy fats to help with maintaining this weight.  Today, given this weight, she feels she is on track.  Her most recent A1C is at 6.9% without the Metformin.    MEDICATIONS: Medication review completed.  Had some problems taking Metformin and has managed to maintain her blood glucose levels using diet, walking and weight loss.  BLOOD GLUCOSE LEVELS:    Fasting:  113,105,110,89,105  2 hours following a meal: 114,97,96120,135,135,123  HYPOGLYCEMIA:  C/O having the S/S of low blood glucose when taking the metformin.  Since stopping the medication has had none of the symptoms of low blood glucose.  HYPERGLYCEMIA:  Denies and S/S of high blood glucose.  DIETARY INTAKE:  24-hr recall:  B (11:30 AM): ham, egg, grits, bagel, (100 calories) and some cream cheese with blue berries.  Then walked.  Snk ( AM) :none  L (3:00 PM): veggies, cabbage, okra, and a meat.    Snk 4:00 A   M): Popcorn, 100 calorie bag. D (6:00 PM): veggies and hamburger steak, Maybe a salad.  Often has a salad with every meal  Snk (12:00 AM): nuts and maybe a pork rinds. Beverages: Water, coffee, milk.  Recent physical activity: Increasing activity when shopping, will park further away.  Daily walks to the mailbox which is 1/2 miles.      Estimated energy needs:  HT: 62 in  WT: 109 lb  BMI:19.5 kg/m2   1400-1500 calories  To maintain current weight 155-160 g carbohydrates 105-108 g protein 38-40 g fat  Progress Towards Goal(s):  In progress.   Nutritional  Diagnosis:  Keiser-2.1 Inpaired nutrition utilization As related to blood glucose.  As evidenced by diagnosis of type 2 diabetes..    Intervention:  Nutrition In her diet recall, she maybe coming up short on the calories on some days.  She is using more nuts and is using measures to help increase the calories without increasing the carb.  She is fearful that the increased walking will use up the energy needed for weight maintenance.  Agree with the interventions suggested by Okey Regal.  Advised to on occasion, do a 24 hour food diary to see just what she is doing.  Continue to weigh herself at least weekly, to keep an eye on her current weight.  Agree that she needs to have some reserves should she experience an illness or need to have a surgery.  Consider going to monitoring 1 time per day.  Vary testing times, check more often if diet or routine changes.  Never stop testing.   Monitoring/Evaluation:  Dietary intake, exercise, blood glucose levels, and body weight In 6 months.

## 2012-01-27 NOTE — Patient Instructions (Addendum)
   When checking post-meal blood glucose levels aim for: 1 hr after the first bite level of 140 mg or less and at 2 hr after the first bite of 120 mg or less.   Try to not lose more weight.  Keep aiming for 1500 calories each day.  The nuts and flax meal are good no carb sources.

## 2012-04-04 ENCOUNTER — Telehealth: Payer: Self-pay | Admitting: Internal Medicine

## 2012-04-04 MED ORDER — METFORMIN HCL 500 MG PO TABS: 500.0000 mg | ORAL_TABLET | Freq: Two times a day (BID) | ORAL | Status: DC

## 2012-04-04 MED ORDER — METFORMIN HCL 500 MG PO TABS: 500.0000 mg | ORAL_TABLET | Freq: Every day | ORAL | Status: DC

## 2012-04-04 NOTE — Telephone Encounter (Signed)
Medication refill

## 2012-04-04 NOTE — Telephone Encounter (Signed)
Ok to re-start metformin at 1 pill in the am, done erx to [facility]

## 2012-04-04 NOTE — Telephone Encounter (Signed)
Patient Information:  Caller Name: Tonya Valdez  Phone: [phone]  Patient: Tonya Valdez, Tonya Valdez  Gender: Female  DOB: [birth year]  Age: 72 Years  PCP: Oliver Barre (Adults only)  Office Follow Up:  Does the office need to follow up with this patient?: Yes  Instructions For The Office: Please inform patient if she needs to resume Metformin or if there are other instructions regarding her control of diabetes.  Thank you.  RN Note:  Tonya Valdez has been to Diabetes Clinic/ classes taught by Tonya Valdez Tonya Valdez and reports that she follows instructions given regarding her diet, medications and exercise.  [facility] is requested for any new Rx;  Phone [phone].    Symptoms  Reason For Call & Symptoms: Before breakfast  the past week, FSBG 120-125.  Formerly on Metformin.  Discussed with RPh and Dietitian who both suggested she Tonya Valdez need to be back on Metformin. Relates she was out of town when this started, she returned to her usual routines for a week and FSBG has not returned to baseline.  She denies any symptoms such as frequent urination, weakness or weight loss.  At conclusion of call FSBG 159.  Reviewed Health History In EMR: Yes  Reviewed Medications In EMR: Yes  Reviewed Allergies In EMR: Yes  Reviewed Surgeries / Procedures: Yes  Date of Onset of Symptoms: 03/27/2012  Treatments Tried: See above.  Treatments Tried Worked: No  Guideline(s) Used:  Diabetes - High Blood Sugar  Disposition Per Guideline:   Discuss with PCP and Callback by Nurse Today  Reason For Disposition Reached:   Caller has NON-URGENT medication question about med that PCP prescribed and triager unable to answer question  Advice Given:  General  Definition of hyperglycemia: - Fasting blood glucose more than 140 mg/dL (7.5 mmol/l) or random blood glucose more than 200 mg/dL (11 mmol/l).  General  Definition of hyperglycemia: - Fasting blood glucose more than 140 mg/dL (7.5 mmol/l) or random blood glucose more than 200  mg/dL (11 mmol/l).  Treatment - Liquids  Drink at least one glass (8 oz or 240 ml) of water per hour for the next 4 hours. (Reason: adequate hydration will reduce hyperglycemia).  Generally, you should try to drink 6-8 glasses of water each day.  Measure and Record Your Blood Glucose  Every day you should measure your blood glucose before breakfast and before going to bed.  Daily Blood Glucose Goals  Pre-prandial (before meal): 70-130 mg/dL (9.5-6.2 mmol/l)  Post-prandial (2-3 hours after a meal): Less than 180 mg/dL (10 mmol/l)

## 2012-04-09 ENCOUNTER — Other Ambulatory Visit: Payer: Self-pay

## 2012-04-25 ENCOUNTER — Ambulatory Visit: Payer: Self-pay | Admitting: Internal Medicine

## 2012-04-25 ENCOUNTER — Telehealth: Payer: Self-pay | Admitting: Internal Medicine

## 2012-04-25 NOTE — Telephone Encounter (Signed)
Patient Information:  Caller Name: Tonya Valdez  Phone: [phone]  Patient: Tonya Valdez  Gender: Female  DOB: Oct 18, 1940  Age: 72 Years  PCP: Oliver Barre (Adults only)  Office Follow Up:  Does the office need to follow up with this patient?: No  Instructions For The Office: N/A   Symptoms  Reason For Call & Symptoms: She states she thinks she has a UTI. Onset last night 04/24/12.  Slight burning and blood in urine.  Last UOP 1 hour ago.  No urgency or frequency noted  Reviewed Health History In EMR: Yes  Reviewed Medications In EMR: Yes  Reviewed Allergies In EMR: Yes  Reviewed Surgeries / Procedures: No  Date of Onset of Symptoms: 04/24/2012  Guideline(s) Used:  Urination Pain - Female  Disposition Per Guideline:   See Today in Office  Reason For Disposition Reached:   Age > 50 years  Advice Given:  Fluids:   Drink extra fluids. Drink 8-10 glasses of liquids a day (Reason: to produce a dilute, non-irritating urine).  Cranberry Juice:   Some people think that drinking cranberry juice may help in fighting urinary tract infections. However, there is no good research that has ever proved this.  Call Back If:  You become worse.  Appointment Scheduled:  04/25/2012 15:45:00 Appointment Scheduled Provider:  Oliver Barre (Adults only)

## 2012-04-25 NOTE — Telephone Encounter (Signed)
To call pt for re-do appt if not already called

## 2012-04-26 ENCOUNTER — Other Ambulatory Visit (INDEPENDENT_AMBULATORY_CARE_PROVIDER_SITE_OTHER): Payer: Medicare Other

## 2012-04-26 ENCOUNTER — Ambulatory Visit (INDEPENDENT_AMBULATORY_CARE_PROVIDER_SITE_OTHER): Payer: Medicare Other | Admitting: Internal Medicine

## 2012-04-26 ENCOUNTER — Encounter: Payer: Self-pay | Admitting: Internal Medicine

## 2012-04-26 ENCOUNTER — Other Ambulatory Visit: Payer: Medicare Other

## 2012-04-26 VITALS — BP 140/62 | HR 91 | Temp 97.1°F | Ht 62.0 in | Wt 105.1 lb

## 2012-04-26 LAB — URINALYSIS, ROUTINE W REFLEX MICROSCOPIC
Nitrite: NEGATIVE
Specific Gravity, Urine: 1.01 (ref 1.000–1.030)
pH: 6 (ref 5.0–8.0)

## 2012-04-26 LAB — POCT URINALYSIS DIPSTICK
Bilirubin, UA: NEGATIVE
Ketones, UA: NEGATIVE
Leukocytes, UA: NEGATIVE

## 2012-04-26 MED ORDER — CEPHALEXIN 500 MG PO CAPS: 500.0000 mg | ORAL_CAPSULE | Freq: Four times a day (QID) | ORAL | Status: DC

## 2012-04-26 NOTE — Assessment & Plan Note (Signed)
stable overall by history and exam, recent data reviewed with pt, and pt to continue medical treatment as before,  to f/u any worsening symptoms or concerns Lab Results  Component Value Date   HGBA1C 6.9* 01/11/2012

## 2012-04-26 NOTE — Assessment & Plan Note (Signed)
stable overall by history and exam, recent data reviewed with pt, and pt to continue medical treatment as before,  to f/u any worsening symptoms or concerns SpO2 Readings from Last 3 Encounters:  04/26/12 95%  01/15/12 98%  01/01/12 99%

## 2012-04-26 NOTE — Assessment & Plan Note (Signed)
If culture negative, will need urology referral for further evaluation

## 2012-04-26 NOTE — Assessment & Plan Note (Signed)
Again urged to quit

## 2012-04-26 NOTE — Assessment & Plan Note (Signed)
Mild, ? Clinical signficance, will give benefit for doubt may be UTI even though UA dip in office neg, for antibx, f/u cx results

## 2012-04-26 NOTE — Telephone Encounter (Signed)
PT HAS AN APPT TODAY.

## 2012-04-26 NOTE — Progress Notes (Signed)
Subjective:    Patient ID: Tonya Valdez, female    DOB: [birth year], 72 y.o.   MRN: [id number]  HPI  Here with 3 days onset gross hematuria that seemed worse the first day with rusty colored urine, now only few flecks of blood today with graudal improvement, also with mild discomfort on urination but no fever and Denies urinary symptoms such as dysuria, frequency, urgency, flank pain, or n/v, fever, chills.  Incidentally has re-started metformin wtihout side effect that she attributed to metformin previously, so wants to continue.  Has been able to decrease her smoking to 6 cigs per day.  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  Pt denies chest pain, increased sob or doe, wheezing, orthopnea, PND, increased LE swelling, palpitations, dizziness or syncope.   Past Medical History  Diagnosis Date  . Positive TB test   . Allergy   . COPD (chronic obstructive pulmonary disease) 10/07/2011  . Type II or unspecified type diabetes mellitus without mention of complication, uncontrolled 10/10/2011  . Cataract    Past Surgical History  Procedure Laterality Date  . Left foot surgury  jan 2003    podiatry  . Gall bladder  1964  . Appendectomy  1964  . Tonsillectomy  1959  . Back surgery  1993  . Hiatal hernia repair  1964  . Cholecystectomy      reports that she has been smoking Cigarettes.  She has a 12.5 pack-year smoking history. She has never used smokeless tobacco. She reports that  drinks alcohol. She reports that she does not use illicit drugs. family history includes Cancer in her other; Cervical cancer in her mother; Colon cancer in her father; Diabetes in her other; Heart disease in her other; Hypertension in her other; Kidney disease in her other; and Lung cancer (age of onset: 64) in her father. Allergies  Allergen Reactions  . Metformin And Related Other (See Comments)    Dizzy and fatigue, lower appetitie  . Rice Nausea And Vomiting   Current Outpatient Prescriptions on File Prior to Visit   Medication Sig Dispense Refill  . ACCU-CHEK FASTCLIX LANCETS MISC Inject 1 each as directed 4 (four) times daily. Use as directed four times daily.  Diagnosis code 250.02Use as directed four times daily.  Diagnosis code 250.02  102 each  11  . aspirin 81 MG EC tablet Take 1 tablet (81 mg total) by mouth daily. Swallow whole.  30 tablet  12  . Blood Glucose Calibration (ACCU-CHEK SMARTVIEW CONTROL) LIQD 1 Bottle by Other route as needed. Use as directed Use as directed  1 each  0  . glucose blood (ACCU-CHEK SMARTVIEW) test strip Test four times daily as instructed.  Diagnosis  250.02  102 each  11  . metFORMIN (GLUCOPHAGE) 500 MG tablet Take 1 tablet (500 mg total) by mouth daily with breakfast.  90 tablet  3   No current facility-administered medications on file prior to visit.   Review of Systems  Constitutional: Negative for unexpected weight change, or unusual diaphoresis  HENT: Negative for tinnitus.   Eyes: Negative for photophobia and visual disturbance.  Respiratory: Negative for choking and stridor.   Gastrointestinal: Negative for vomiting and blood in stool.  Genitourinary: Negative for decreased urine volume.  Musculoskeletal: Negative for acute joint swelling Skin: Negative for color change and wound.  Neurological: Negative for tremors and numbness other than noted  Psychiatric/Behavioral: Negative for decreased concentration or  hyperactivity.       Objective:   Physical Exam  BP 140/62  Pulse 91  Temp(Src) 97.1 F (36.2 C) (Oral)  Ht 5\' 2"  (1.575 m)  Wt 105 lb 2 oz (47.684 kg)  BMI 19.22 kg/m2  SpO2 95% VS noted, not ill appearing Constitutional: Pt appears well-developed and well-nourished.  HENT: Head: NCAT.  Right Ear: External ear normal.  Left Ear: External ear normal.  Eyes: Conjunctivae and EOM are normal. Pupils are equal, round, and reactive to light.  Neck: Normal range of motion. Neck supple.  Cardiovascular: Normal rate and regular rhythm.    Pulmonary/Chest: Effort normal and breath sounds normal.  Abd:  Soft, non-distended, + BS with mild right/mid low suprapubic tender Neurological: Pt is alert. Not confused , motor intact Skin: Skin is warm. No erythema.  Psychiatric: Pt behavior is normal. Thought content normal.         Assessment & Plan:

## 2012-04-26 NOTE — Patient Instructions (Signed)
Please take all new medication as prescribed Please continue all other medications as before, and refills have been done if requested. The urine will be sent for culture

## 2012-05-03 ENCOUNTER — Other Ambulatory Visit: Payer: Self-pay | Admitting: Internal Medicine

## 2012-05-03 DIAGNOSIS — R310 Gross hematuria: Secondary | ICD-10-CM

## 2012-06-16 ENCOUNTER — Other Ambulatory Visit: Payer: Self-pay | Admitting: Urology

## 2012-06-22 ENCOUNTER — Encounter (HOSPITAL_BASED_OUTPATIENT_CLINIC_OR_DEPARTMENT_OTHER): Payer: Self-pay | Admitting: *Deleted

## 2012-06-22 NOTE — Progress Notes (Signed)
NPO AFTER MN. ARRIVES AT 0900. NEEDS ISTAT. CURRENT CXR AND EKG IN EPIC AND CHART. PT IS FULL-TIME RN.

## 2012-06-24 ENCOUNTER — Ambulatory Visit (HOSPITAL_BASED_OUTPATIENT_CLINIC_OR_DEPARTMENT_OTHER): Payer: Medicare Other | Admitting: Anesthesiology

## 2012-06-24 ENCOUNTER — Ambulatory Visit (HOSPITAL_BASED_OUTPATIENT_CLINIC_OR_DEPARTMENT_OTHER)
Admission: RE | Admit: 2012-06-24 | Discharge: 2012-06-24 | Disposition: A | Discharge status: Home / Self Care | Payer: Medicare Other | Source: Ambulatory Visit | Attending: Urology | Admitting: Urology

## 2012-06-24 ENCOUNTER — Encounter (HOSPITAL_BASED_OUTPATIENT_CLINIC_OR_DEPARTMENT_OTHER)
Admission: RE | Disposition: A | Discharge status: Home / Self Care | Payer: Self-pay | Source: Ambulatory Visit | Attending: Urology

## 2012-06-24 ENCOUNTER — Encounter (HOSPITAL_BASED_OUTPATIENT_CLINIC_OR_DEPARTMENT_OTHER): Payer: Self-pay | Admitting: *Deleted

## 2012-06-24 ENCOUNTER — Encounter (HOSPITAL_BASED_OUTPATIENT_CLINIC_OR_DEPARTMENT_OTHER): Payer: Self-pay | Admitting: Anesthesiology

## 2012-06-24 DIAGNOSIS — Z800 Family history of malignant neoplasm of digestive organs: Secondary | ICD-10-CM | POA: Insufficient documentation

## 2012-06-24 DIAGNOSIS — E119 Type 2 diabetes mellitus without complications: Secondary | ICD-10-CM | POA: Insufficient documentation

## 2012-06-24 DIAGNOSIS — J449 Chronic obstructive pulmonary disease, unspecified: Secondary | ICD-10-CM | POA: Insufficient documentation

## 2012-06-24 DIAGNOSIS — R319 Hematuria, unspecified: Secondary | ICD-10-CM

## 2012-06-24 DIAGNOSIS — Z87891 Personal history of nicotine dependence: Secondary | ICD-10-CM | POA: Insufficient documentation

## 2012-06-24 DIAGNOSIS — R911 Solitary pulmonary nodule: Secondary | ICD-10-CM | POA: Insufficient documentation

## 2012-06-24 DIAGNOSIS — C674 Malignant neoplasm of posterior wall of bladder: Secondary | ICD-10-CM | POA: Insufficient documentation

## 2012-06-24 DIAGNOSIS — J4489 Other specified chronic obstructive pulmonary disease: Secondary | ICD-10-CM | POA: Insufficient documentation

## 2012-06-24 DIAGNOSIS — Z801 Family history of malignant neoplasm of trachea, bronchus and lung: Secondary | ICD-10-CM | POA: Insufficient documentation

## 2012-06-24 DIAGNOSIS — Z8249 Family history of ischemic heart disease and other diseases of the circulatory system: Secondary | ICD-10-CM | POA: Insufficient documentation

## 2012-06-24 DIAGNOSIS — Z79899 Other long term (current) drug therapy: Secondary | ICD-10-CM | POA: Insufficient documentation

## 2012-06-24 HISTORY — DX: Neoplasm of unspecified behavior of bladder: D49.4

## 2012-06-24 HISTORY — DX: Type 2 diabetes mellitus without complications: E11.9

## 2012-06-24 HISTORY — PX: TRANSURETHRAL RESECTION OF BLADDER TUMOR: SHX2575

## 2012-06-24 LAB — POCT I-STAT 4, (NA,K, GLUC, HGB,HCT)
Glucose, Bld: 147 mg/dL — ABNORMAL HIGH (ref 70–99)
HCT: 42 % (ref 36.0–46.0)
Hemoglobin: 14.3 g/dL (ref 12.0–15.0)
Potassium: 3.9 mEq/L (ref 3.5–5.1)
Sodium: 142 mEq/L (ref 135–145)

## 2012-06-24 LAB — GLUCOSE, CAPILLARY: Glucose-Capillary: 147 mg/dL — ABNORMAL HIGH (ref 70–99)

## 2012-06-24 SURGERY — TURBT (TRANSURETHRAL RESECTION OF BLADDER TUMOR)
Anesthesia: General | Site: Bladder | Wound class: Clean Contaminated

## 2012-06-24 MED ORDER — LACTATED RINGERS IV SOLN
INTRAVENOUS | Status: DC
  Administered 2012-06-24 (×3): via INTRAVENOUS
  Filled 2012-06-24: qty 1000

## 2012-06-24 MED ORDER — EPHEDRINE SULFATE 50 MG/ML IJ SOLN
INTRAMUSCULAR | Status: DC | PRN
  Administered 2012-06-24: 5 mg via INTRAVENOUS
  Administered 2012-06-24 (×2): 10 mg via INTRAVENOUS

## 2012-06-24 MED ORDER — CIPROFLOXACIN IN D5W 200 MG/100ML IV SOLN
200.0000 mg | INTRAVENOUS | Status: AC
  Administered 2012-06-24: 200 mg via INTRAVENOUS
  Filled 2012-06-24: qty 100

## 2012-06-24 MED ORDER — FENTANYL CITRATE 0.05 MG/ML IJ SOLN
25.0000 ug | INTRAMUSCULAR | Status: DC | PRN
  Filled 2012-06-24: qty 1

## 2012-06-24 MED ORDER — HYDROCODONE-ACETAMINOPHEN 10-325 MG PO TABS: 1.0000 | ORAL_TABLET | Freq: Four times a day (QID) | ORAL | Status: DC | PRN

## 2012-06-24 MED ORDER — SODIUM CHLORIDE 0.9 % IR SOLN
Status: DC | PRN
  Administered 2012-06-24: 9000 mL

## 2012-06-24 MED ORDER — FENTANYL CITRATE 0.05 MG/ML IJ SOLN
INTRAMUSCULAR | Status: DC | PRN
  Administered 2012-06-24 (×2): 25 ug via INTRAVENOUS
  Administered 2012-06-24: 50 ug via INTRAVENOUS

## 2012-06-24 MED ORDER — PHENAZOPYRIDINE HCL 200 MG PO TABS: 200.0000 mg | ORAL_TABLET | Freq: Three times a day (TID) | ORAL | Status: DC | PRN

## 2012-06-24 MED ORDER — PROPOFOL 10 MG/ML IV BOLUS
INTRAVENOUS | Status: DC | PRN
  Administered 2012-06-24: 180 mg via INTRAVENOUS

## 2012-06-24 MED ORDER — LACTATED RINGERS IV SOLN
INTRAVENOUS | Status: DC
  Filled 2012-06-24: qty 1000

## 2012-06-24 MED ORDER — LIDOCAINE HCL (CARDIAC) 20 MG/ML IV SOLN
INTRAVENOUS | Status: DC | PRN
  Administered 2012-06-24: 75 mg via INTRAVENOUS

## 2012-06-24 MED ORDER — MITOMYCIN CHEMO FOR BLADDER INSTILLATION 40 MG
40.0000 mg | Freq: Once | INTRAVENOUS | Status: AC
  Administered 2012-06-24: 40 mg via INTRAVESICAL
  Filled 2012-06-24: qty 40

## 2012-06-24 SURGICAL SUPPLY — 36 items
BAG DRAIN URO-CYSTO SKYTR STRL (DRAIN) ×2 IMPLANT
BAG DRN ANRFLXCHMBR STRAP LEK (BAG)
BAG DRN UROCATH (DRAIN) ×1
BAG URINE DRAINAGE (UROLOGICAL SUPPLIES) IMPLANT
BAG URINE LEG 19OZ MD ST LTX (BAG) IMPLANT
CANISTER SUCT LVC 12 LTR MEDI- (MISCELLANEOUS) ×2 IMPLANT
CATH FOLEY 2WAY SLVR  5CC 20FR (CATHETERS) ×1
CATH FOLEY 2WAY SLVR  5CC 22FR (CATHETERS)
CATH FOLEY 2WAY SLVR  5CC 24FR (CATHETERS) ×1
CATH FOLEY 2WAY SLVR 5CC 20FR (CATHETERS) IMPLANT
CATH FOLEY 2WAY SLVR 5CC 22FR (CATHETERS) IMPLANT
CATH FOLEY 2WAY SLVR 5CC 24FR (CATHETERS) ×1 IMPLANT
CLOTH BEACON ORANGE TIMEOUT ST (SAFETY) ×2 IMPLANT
DRAPE CAMERA CLOSED 9X96 (DRAPES) ×2 IMPLANT
ELECT BUTTON HF 24-28F 2 30DE (ELECTRODE) IMPLANT
ELECT LOOP MED HF 24F 12D (CUTTING LOOP) ×1 IMPLANT
ELECT REM PT RETURN 9FT ADLT (ELECTROSURGICAL) ×2
ELECT RESECT VAPORIZE 12D CBL (ELECTRODE) ×2 IMPLANT
ELECTRODE REM PT RTRN 9FT ADLT (ELECTROSURGICAL) ×1 IMPLANT
EVACUATOR MICROVAS BLADDER (UROLOGICAL SUPPLIES) ×1 IMPLANT
GLOVE BIO SURGEON STRL SZ 6.5 (GLOVE) ×2 IMPLANT
GLOVE BIO SURGEON STRL SZ8 (GLOVE) ×2 IMPLANT
GLOVE ECLIPSE 6.0 STRL STRAW (GLOVE) ×1 IMPLANT
GLOVE ECLIPSE 6.5 STRL STRAW (GLOVE) ×1 IMPLANT
GLOVE INDICATOR 6.5 STRL GRN (GLOVE) ×1 IMPLANT
GOWN PREVENTION PLUS LG XLONG (DISPOSABLE) ×3 IMPLANT
GOWN STRL REIN XL XLG (GOWN DISPOSABLE) ×2 IMPLANT
HOLDER FOLEY CATH W/STRAP (MISCELLANEOUS) ×1 IMPLANT
IV NS IRRIG 3000ML ARTHROMATIC (IV SOLUTION) ×3 IMPLANT
KIT ASPIRATION TUBING (SET/KITS/TRAYS/PACK) IMPLANT
NS IRRIG 500ML POUR BTL (IV SOLUTION) ×2 IMPLANT
PACK CYSTOSCOPY (CUSTOM PROCEDURE TRAY) ×2 IMPLANT
PLUG CATH AND CAP STER (CATHETERS) IMPLANT
SET ASPIRATION TUBING (TUBING) ×1 IMPLANT
WATER STERILE IRR 3000ML UROMA (IV SOLUTION) IMPLANT
WATER STERILE IRR 500ML POUR (IV SOLUTION) ×1 IMPLANT

## 2012-06-24 NOTE — H&P (Signed)
History of Present Illness            Gross hematuria: The patient reported having an episode of gross hematuria that may have been associated with mild dysuria but no other voiding symptoms.  She has, as a risk factor for urothelial carcinoma, a history of cigarette smoking.  She reports that she has never had gross hematuria previously. Her first episode occurred about 6 weeks ago and then a second time about a week after that and has not seen any further gross hematuria.   Interval history: She has seen no further gross hematuria. No new complaints are noted today.   Past Medical History Problems  1. History of  Chronic Obstructive Pulmonary Disease 496 2. History of  Diabetes Mellitus 250.00  Surgical History Problems  1. History of  Appendectomy 2. History of  Back Surgery 3. History of  Cholecystectomy 4. History of  Foot Surgery Left 5. History of  Hernia Repair 6. History of  Tonsillectomy  Current Meds 1. MetFORMIN HCl 500 MG Oral Tablet; Therapy: 10Feb2014 to  Allergies Medication  1. No Known Drug Allergies  Family History Problems  1. Family history of  Acute Myocardial Infarction V17.3 2. Family history of  Colon Cancer V16.0 3. Family history of  Lung Cancer V16.1  Social History Problems  1. Being A Social Drinker 2. Caffeine Use 3. Former Smoker V15.82 1/2 ppdx50 years 4. Marital History - Widowed 5. Occupation:   Review of Systems Genitourinary, constitutional, skin, eye, otolaryngeal, hematologic/lymphatic, cardiovascular, pulmonary, endocrine, musculoskeletal, gastrointestinal, neurological and psychiatric system(s) were reviewed and pertinent findings if present are noted.  Genitourinary: urinary urgency and dysuria.  Constitutional: recent weight loss.    Physical Exam Constitutional: Well nourished and well developed . No acute distress.  ENT:. The ears and nose are normal in appearance.  Neck: The appearance of the neck is normal and no  neck mass is present.  Pulmonary: No respiratory distress and normal respiratory rhythm and effort.  Cardiovascular: Heart rate and rhythm are normal . No peripheral edema.  Abdomen: The abdomen is soft and nontender. No masses are palpated. No CVA tenderness. No hernias are palpable. No hepatosplenomegaly noted.  Lymphatics: The femoral and inguinal nodes are not enlarged or tender.  Skin: Normal skin turgor, no visible rash and no visible skin lesions.  Neuro/Psych:. Mood and affect are appropriate.   Assessment Assessed  1. 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 Gross Hematuria 599.71 3. CT Lung Pulmonary Nodule Solitary, Right Only      I went over the results of the CT scan as well as my cystoscopic findings today which have revealed a bladder mass consistent with transitional cell carcinoma. She had one area of papillary tumor that was noted on the posterior wall of the bladder near the floor as well as a second area near the bladder neck in the trigonal region but not involving either of the ureteral orifices.   We discussed the fact that currently there is no evidence of extravesical extension or pelvic adenopathy based on CT scan findings. Further characterization of the lesion is required for grading and staging purposes. We discussed proceeding with evaluation using transurethral resection of the lesion. I have discussed the procedure in detail as well as the potential risks and complications associated with this form of surgery. We also discussed the probability of successful resection of the intravesical portion of this lesion. I have recommended, as long as  there is no contraindication at the time of surgery, the placement of intravesical mitomycin-C in order to reduce the risk of recurrence. We did discuss the potential side effects of this form of intravesical chemotherapy. The procedure will be performed under anesthesia as an  outpatient.  We also discussed the fact that her CT scan has revealed an 8 mm right pulmonary nodule. Because she has been a smoker this places her at increased risk for bronchogenic carcinoma and she will therefore need to have this followed up. I told her I would supply Dr. Jonny Ruiz with a copy of her CT scan report.   Plan    She is scheduled for an outpatient TURBT and instillation of mitomycin-C postoperatively.

## 2012-06-24 NOTE — Op Note (Signed)
PATIENT:  Tonya Valdez  PRE-OPERATIVE DIAGNOSIS: Bladder tumors  POST-OPERATIVE DIAGNOSIS: Same  PROCEDURE:  Procedure(s): 1. TRANSURETHRAL RESECTION OF BLADDER TUMOR (TURBT) (3cm.) 2. instillation of intravesical chemotherapy  SURGEON:  Surgeon(s): Garnett Farm  ANESTHESIA:   General  EBL:  Minimal  DRAINS: Urinary Catheter (20 Fr. Foley)   SPECIMEN:  Source of Specimen:  Bladder tumor  DISPOSITION OF SPECIMEN:  PATHOLOGY  Indication: [name] is a 72 year old female who expressed gross hematuria and was evaluated initially with a CT scan that revealed no abnormality of the upper tract. Cystoscopically I found bladder tumors and have discussed transurethral resectional with her. She understands and elected to proceed.  Description of operation: The patient was taken to the operating room and administered general anesthesia. She was then placed on the table and moved to the dorsal lithotomy position after which her genitalia was sterilely prepped and draped. An official timeout was then performed.  I initially placed the 22 French cystoscope with 12 lens and the bladder and did a full inspection of the bladder. I noted the ureteral orifices were of normal configuration and position. There was a fairly large tumor on the posterior wall the bladder as well as a second tumor on the floor of the bladder on the right-hand side near but not involving the bladder neck. I did identify 2 smaller tumors on the floor of the bladder on the left-hand side. These were well away from the ureteral orifices.  I then introduced the 26 [place] resectoscope with [place]lake obturator was then introduced into the bladder and the obturator was removed. The resectoscope element with 12 lens was then inserted and the bladder. I first began by resecting the bladder tumor on the posterior wall. I then resected the tumor on the floor of the bladder on the right-hand side and then those on the left-hand  side. I removed all of this resected material using the Microvasive evacuator and this was sent to pathology as bladder tumor. I then resected the base of the bladder tumor posteriorly and also from the right-hand side and sent the specimen as base of bladder tumor. Reinspection of the bladder revealed all obvious tumor had been fully resected and there was no evidence of perforation. I then removed the resectoscope.  A 20 French Foley catheter was then inserted in the bladder and irrigated. The irrigant returned clear. The patient was awakened and taken to the recovery room.  While in the recovery room 40 mg of mitomycin-C in 40 cc of water were instilled in the bladder through the catheter and the catheter was plugged. This will remain indwelling for approximately one hour. It will then be drained from the bladder and the catheter will be removed and the patient discharged home.  PLAN OF CARE: Discharge to home after PACU  PATIENT DISPOSITION:  PACU - hemodynamically stable.

## 2012-06-24 NOTE — Anesthesia Preprocedure Evaluation (Addendum)
Anesthesia Evaluation  Patient identified by MRN, date of birth, ID band Patient awake    Reviewed: Allergy & Precautions, H&P , NPO status , Patient's Chart, lab work & pertinent test results  Airway Mallampati: II TM Distance: >3 FB Neck ROM: full    Dental  (+) Edentulous Upper and Edentulous Lower   Pulmonary COPD Mild COPD breath sounds clear to auscultation  Pulmonary exam normal       Cardiovascular Exercise Tolerance: Good negative cardio ROS  Rhythm:regular Rate:Normal     Neuro/Psych negative neurological ROS  negative psych ROS   GI/Hepatic negative GI ROS, Neg liver ROS,   Endo/Other  diabetes, Well Controlled, Type 2, Oral Hypoglycemic AgentsMainly diet and exercise controlled  Renal/GU negative Renal ROS  negative genitourinary   Musculoskeletal   Abdominal   Peds  Hematology negative hematology ROS (+)   Anesthesia Other Findings   Reproductive/Obstetrics negative OB ROS                          Anesthesia Physical Anesthesia Plan  ASA: III  Anesthesia Plan: General   Post-op Pain Management:    Induction: Intravenous  Airway Management Planned: LMA  Additional Equipment:   Intra-op Plan:   Post-operative Plan:   Informed Consent: I have reviewed the patients History and Physical, chart, labs and discussed the procedure including the risks, benefits and alternatives for the proposed anesthesia with the patient or authorized representative who has indicated his/her understanding and acceptance.   Dental Advisory Given  Plan Discussed with: CRNA and Surgeon  Anesthesia Plan Comments:         Anesthesia Quick Evaluation

## 2012-06-24 NOTE — Anesthesia Postprocedure Evaluation (Signed)
  Anesthesia Post-op Note  Patient: Tonya Valdez  Procedure(s) Performed: Procedure(s) (LRB): TRANSURETHRAL RESECTION OF BLADDER TUMOR  WITH GYRUS AND INSTILLATION OF MITOMYCIN C  (TURBT) (N/A)  Patient Location: PACU  Anesthesia Type: General  Level of Consciousness: awake and alert   Airway and Oxygen Therapy: Patient Spontanous Breathing  Post-op Pain: mild  Post-op Assessment: Post-op Vital signs reviewed, Patient's Cardiovascular Status Stable, Respiratory Function Stable, Patent Airway and No signs of Nausea or vomiting  Last Vitals:  Filed Vitals:   06/24/12 1145  BP: 120/90  Pulse: 85  Temp:   Resp: 12    Post-op Vital Signs: stable   Complications: No apparent anesthesia complications

## 2012-06-24 NOTE — Transfer of Care (Signed)
Immediate Anesthesia Transfer of Care Note  Patient: Tonya Valdez  Procedure(s) Performed: Procedure(s) (LRB): TRANSURETHRAL RESECTION OF BLADDER TUMOR  WITH GYRUS AND INSTILLATION OF MITOMYCIN C  (TURBT) (N/A)  Patient Location: Patient transported to PACU with oxygen via face mask at 4 Liters / Min  Anesthesia Type: General  Level of Consciousness: awake and alert   Airway & Oxygen Therapy: Patient Spontanous Breathing and Patient connected to face mask oxygen  Post-op Assessment: Report given to PACU RN and Post -op Vital signs reviewed and stable  Post vital signs: Reviewed and stable  Dentition: Teeth and oropharynx remain in pre-op condition  Complications: No apparent anesthesia complications

## 2012-06-24 NOTE — Anesthesia Procedure Notes (Signed)
Procedure Name: LMA Insertion Date/Time: 06/24/2012 10:27 AM Performed by: Fran Lowes Pre-anesthesia Checklist: Patient identified, Emergency Drugs available, Suction available and Patient being monitored Patient Re-evaluated:Patient Re-evaluated prior to inductionOxygen Delivery Method: Circle System Utilized Preoxygenation: Pre-oxygenation with 100% oxygen Intubation Type: IV induction Ventilation: Mask ventilation without difficulty LMA: LMA inserted LMA Size: 3.0 Number of attempts: 1 Airway Equipment and Method: bite block Placement Confirmation: positive ETCO2 Tube secured with: Tape Dental Injury: Teeth and Oropharynx as per pre-operative assessment

## 2012-06-27 ENCOUNTER — Encounter (HOSPITAL_BASED_OUTPATIENT_CLINIC_OR_DEPARTMENT_OTHER): Payer: Self-pay | Admitting: Urology

## 2012-08-04 ENCOUNTER — Other Ambulatory Visit: Payer: Self-pay | Admitting: Internal Medicine

## 2012-08-04 ENCOUNTER — Ambulatory Visit (INDEPENDENT_AMBULATORY_CARE_PROVIDER_SITE_OTHER)
Admission: RE | Admit: 2012-08-04 | Discharge: 2012-08-04 | Disposition: A | Discharge status: Home / Self Care | Payer: Medicare Other | Source: Ambulatory Visit | Attending: Internal Medicine | Admitting: Internal Medicine

## 2012-08-04 ENCOUNTER — Encounter: Payer: Self-pay | Admitting: Internal Medicine

## 2012-08-04 ENCOUNTER — Other Ambulatory Visit (INDEPENDENT_AMBULATORY_CARE_PROVIDER_SITE_OTHER): Payer: Medicare Other

## 2012-08-04 ENCOUNTER — Ambulatory Visit (INDEPENDENT_AMBULATORY_CARE_PROVIDER_SITE_OTHER): Payer: Medicare Other | Admitting: Internal Medicine

## 2012-08-04 VITALS — BP 150/88 | HR 96 | Temp 98.3°F | Wt 105.0 lb

## 2012-08-04 DIAGNOSIS — R0989 Other specified symptoms and signs involving the circulatory and respiratory systems: Secondary | ICD-10-CM

## 2012-08-04 DIAGNOSIS — R609 Edema, unspecified: Secondary | ICD-10-CM

## 2012-08-04 DIAGNOSIS — IMO0001 Reserved for inherently not codable concepts without codable children: Secondary | ICD-10-CM

## 2012-08-04 DIAGNOSIS — R600 Localized edema: Secondary | ICD-10-CM | POA: Insufficient documentation

## 2012-08-04 DIAGNOSIS — R0609 Other forms of dyspnea: Secondary | ICD-10-CM

## 2012-08-04 DIAGNOSIS — R0600 Dyspnea, unspecified: Secondary | ICD-10-CM

## 2012-08-04 DIAGNOSIS — J4489 Other specified chronic obstructive pulmonary disease: Secondary | ICD-10-CM

## 2012-08-04 DIAGNOSIS — I5022 Chronic systolic (congestive) heart failure: Secondary | ICD-10-CM

## 2012-08-04 DIAGNOSIS — J449 Chronic obstructive pulmonary disease, unspecified: Secondary | ICD-10-CM

## 2012-08-04 DIAGNOSIS — I509 Heart failure, unspecified: Secondary | ICD-10-CM | POA: Insufficient documentation

## 2012-08-04 DIAGNOSIS — R911 Solitary pulmonary nodule: Secondary | ICD-10-CM

## 2012-08-04 HISTORY — DX: Chronic systolic (congestive) heart failure: I50.22

## 2012-08-04 LAB — BASIC METABOLIC PANEL
Calcium: 9.4 mg/dL (ref 8.4–10.5)
GFR: 96.71 mL/min (ref >60.00)
Potassium: 4.1 mEq/L (ref 3.5–5.1)
Sodium: 139 mEq/L (ref 135–145)

## 2012-08-04 LAB — CBC WITH DIFFERENTIAL/PLATELET
Basophils Absolute: 0.1 10*3/uL (ref 0.0–0.1)
Eosinophils Relative: 1.5 % (ref 0.0–5.0)
HCT: 39 % (ref 36.0–46.0)
Hemoglobin: 12.9 g/dL (ref 12.0–15.0)
Lymphs Abs: 2.8 10*3/uL (ref 0.7–4.0)
MCV: 93.7 fl (ref 78.0–100.0)
Monocytes Absolute: 0.8 10*3/uL (ref 0.1–1.0)
Monocytes Relative: 7.4 % (ref 3.0–12.0)
Neutro Abs: 6.7 10*3/uL (ref 1.4–7.7)
Platelets: 231 10*3/uL (ref 150.0–400.0)
RDW: 14.6 % (ref 11.5–14.6)

## 2012-08-04 LAB — HEMOGLOBIN A1C: Hgb A1c MFr Bld: 7.1 % — ABNORMAL HIGH (ref 4.6–6.5)

## 2012-08-04 LAB — HEPATIC FUNCTION PANEL
AST: 24 U/L (ref 0–37)
Alkaline Phosphatase: 80 U/L (ref 39–117)
Total Bilirubin: 0.9 mg/dL (ref 0.3–1.2)

## 2012-08-04 LAB — URINALYSIS, ROUTINE W REFLEX MICROSCOPIC
Bilirubin Urine: NEGATIVE
Ketones, ur: NEGATIVE
Specific Gravity, Urine: 1.01 (ref 1.000–1.030)
Urine Glucose: NEGATIVE
pH: 6 (ref 5.0–8.0)

## 2012-08-04 LAB — MICROALBUMIN / CREATININE URINE RATIO: Microalb, Ur: 9.9 mg/dL — ABNORMAL HIGH (ref 0.0–1.9)

## 2012-08-04 LAB — LIPID PANEL
HDL: 45.4 mg/dL (ref >39.00)
LDL Cholesterol: 60 mg/dL (ref 0–99)
Total CHOL/HDL Ratio: 3
VLDL: 16.8 mg/dL (ref 0.0–40.0)

## 2012-08-04 LAB — TSH: TSH: 3.26 u[IU]/mL (ref 0.35–5.50)

## 2012-08-04 MED ORDER — FUROSEMIDE 20 MG PO TABS: 20.0000 mg | ORAL_TABLET | Freq: Every day | ORAL | Status: DC | PRN

## 2012-08-04 NOTE — Assessment & Plan Note (Signed)
Unclear etiology, ? Venous insuff vs other, chest without rales, volume status unclear, for cxr, labs as ordered, for lasix 20 qd prn

## 2012-08-04 NOTE — Patient Instructions (Addendum)
Please take all new medication as prescribed - the lasix - Only as needed for swelling Please continue all other medications as before, and refills have been done if requested. Please have the pharmacy call with any other refills you may need. Please go to the XRAY Department in the Basement (go straight as you get off the elevator) for the x-ray testing Please go to the LAB in the Basement (turn left off the elevator) for the tests to be done today You will be contacted by phone if any changes need to be made immediately.  Otherwise, you will receive a letter about your results with an explanation, but please check with MyChart first.  Thank you for enrolling in MyChart. Please follow the instructions below to securely access your online medical record. MyChart allows you to send messages to your doctor, view your test results, renew your prescriptions, schedule appointments, and more.

## 2012-08-04 NOTE — Progress Notes (Signed)
Subjective:    Patient ID: RANITA STJULIEN, female    DOB: 03/05/1940, 72 y.o.   MRN: [id number]  HPI  Here prior to scheduled appt with 2-3 wks onset worsening ankle edema, slightly worse right than left, Pt denies  wheezing, orthopnea, PND, increased LE swelling, palpitations, dizziness or syncope, but also with occasional cough and doe.  No sob at rest.  ECG 2013 NSR. No prior hx of CHF. Has COPD, PA pressure unknown.  No other specific hx of CV disease, hepatorenal, anemia, thyroid or other.  Has mult LE varicosities.  Edema now will not resolve overnight as when it started.  Pt denies fever, wt loss, night sweats, loss of appetite, or other constitutional symptoms   Pt denies new neurological symptoms such as new headache, or facial or extremity weakness or numbness  Pt denies polydipsia, polyuria. Also reminds me today a RLL pulm nodule was found on Ct per urology, has been long term smoker > 50 yrs, will need f/u CT at 3-6 mo. Past Medical History  Diagnosis Date  . Positive TB test   . Bladder tumor   . COPD (chronic obstructive pulmonary disease)     PT DENIES SOB  . Diabetes mellitus TYPE II--  PER PCP NOTE DR Oliver Barre    PER PT DOES NOT TAKES METFORMIN PRESCRIBED, SHE WATCHES DIET AND EXERCISES  . Congestive heart failure, unspecified 08/04/2012   Past Surgical History  Procedure Laterality Date  . Tonsillectomy  1959  . Hiatal hernia repair  1964  (AGE 10)    CHOLECYSTECTOMY AND APPENDECTOMY  . Left elbow surgery    . Lumbar disc surgery  1993  . Abdominal hysterectomy  1981 (APPROX)    BILATERAL SALPINGO-OOPHORECTOMY WITH EXCEPTION A SMALL PIECE OF OVARY REMAINS  . Transurethral resection of bladder tumor N/A 06/24/2012    Procedure: TRANSURETHRAL RESECTION OF BLADDER TUMOR  WITH GYRUS AND INSTILLATION OF MITOMYCIN C  (TURBT);  Surgeon: Garnett Farm, MD;  Location: [facility];  Service: Urology;  Laterality: N/A;    reports that she has been smoking  Cigarettes.  She has a 10 pack-year smoking history. She has never used smokeless tobacco. She reports that  drinks alcohol. She reports that she does not use illicit drugs. family history includes Cancer in her other; Cervical cancer in her mother; Colon cancer in her father; Diabetes in her other; Heart disease in her other; Hypertension in her other; Kidney disease in her other; and Lung cancer (age of onset: 20) in her father. No Known Allergies Current Outpatient Prescriptions on File Prior to Visit  Medication Sig Dispense Refill  . aspirin 81 MG tablet Take 81 mg by mouth daily.      [name] HYDROcodone-acetaminophen (NORCO) 10-325 MG per tablet Take 1 tablet by mouth every 6 (six) hours as needed for pain.  30 tablet  0  . phenazopyridine (PYRIDIUM) 200 MG tablet Take 1 tablet (200 mg total) by mouth 3 (three) times daily as needed for pain.  30 tablet  0   No current facility-administered medications on file prior to visit.   Review of Systems  Constitutional: Negative for unexpected weight change, or unusual diaphoresis  HENT: Negative for tinnitus.   Eyes: Negative for photophobia and visual disturbance.  Respiratory: Negative for choking and stridor.   Gastrointestinal: Negative for vomiting and blood in stool.  Genitourinary: Negative for hematuria and decreased urine volume.  Musculoskeletal: Negative for acute joint swelling Skin: Negative for color  change and wound.  Neurological: Negative for tremors and numbness other than noted  Psychiatric/Behavioral: Negative for decreased concentration or  hyperactivity.       Objective:   Physical Exam BP 150/88  Pulse 96  Temp(Src) 98.3 F (36.8 C) (Oral)  Wt 105 lb (47.628 kg)  BMI 19.2 kg/m2  SpO2 97% VS noted,  Constitutional: Pt appears well-developed and well-nourished.  HENT: Head: NCAT.  Right Ear: External ear normal.  Left Ear: External ear normal.  Eyes: Conjunctivae and EOM are normal. Pupils are equal, round, and  reactive to light.  Neck: Normal range of motion. Neck supple.  Cardiovascular: Normal rate and regular rhythm.   Pulmonary/Chest: Effort normal and breath sounds decreased, no rales or wheezing  Abd:  Soft, NT, non-distended, + BS Neurological: Pt is alert. Not confused  Skin: Skin is warm. No erythema. 1+ ankle edema bialt Psychiatric: Pt behavior is normal. Thought content normal.   Aug 2013 ECG - NSR, no acute, pt declines repeat today    Assessment & Plan:

## 2012-08-04 NOTE — Assessment & Plan Note (Signed)
stable overall by history and exam, recent data reviewed with pt, and pt to continue medical treatment as before,  to f/u any worsening symptoms or concerns Lab Results  Component Value Date   HGBA1C 7.1* 08/04/2012

## 2012-08-04 NOTE — Assessment & Plan Note (Addendum)
Consider echo, o/w stable overall by history and exam, recent data reviewed with pt, and pt to continue medical treatment as before,  to f/u any worsening symptoms or concerns SpO2 Readings from Last 3 Encounters:  08/04/12 97%  06/24/12 94%  06/24/12 94%   Note:  Total time for pt hx, exam, review of record with pt in the room, determination of diagnoses and plan for further eval and tx is > 40 min, with over 50% spent in coordination and counseling of patient

## 2012-08-04 NOTE — Assessment & Plan Note (Signed)
Will plan on CT with next f/u appt

## 2012-08-17 ENCOUNTER — Ambulatory Visit (HOSPITAL_COMMUNITY): Payer: Medicare Other | Attending: Internal Medicine | Admitting: Radiology

## 2012-08-17 DIAGNOSIS — R0989 Other specified symptoms and signs involving the circulatory and respiratory systems: Secondary | ICD-10-CM | POA: Insufficient documentation

## 2012-08-17 DIAGNOSIS — J4489 Other specified chronic obstructive pulmonary disease: Secondary | ICD-10-CM | POA: Insufficient documentation

## 2012-08-17 DIAGNOSIS — E119 Type 2 diabetes mellitus without complications: Secondary | ICD-10-CM | POA: Insufficient documentation

## 2012-08-17 DIAGNOSIS — I079 Rheumatic tricuspid valve disease, unspecified: Secondary | ICD-10-CM | POA: Insufficient documentation

## 2012-08-17 DIAGNOSIS — R609 Edema, unspecified: Secondary | ICD-10-CM | POA: Insufficient documentation

## 2012-08-17 DIAGNOSIS — I509 Heart failure, unspecified: Secondary | ICD-10-CM

## 2012-08-17 DIAGNOSIS — I059 Rheumatic mitral valve disease, unspecified: Secondary | ICD-10-CM | POA: Insufficient documentation

## 2012-08-17 DIAGNOSIS — J449 Chronic obstructive pulmonary disease, unspecified: Secondary | ICD-10-CM | POA: Insufficient documentation

## 2012-08-17 DIAGNOSIS — R0609 Other forms of dyspnea: Secondary | ICD-10-CM | POA: Insufficient documentation

## 2012-08-17 DIAGNOSIS — I379 Nonrheumatic pulmonary valve disorder, unspecified: Secondary | ICD-10-CM | POA: Insufficient documentation

## 2012-08-17 NOTE — Progress Notes (Signed)
Echocardiogram performed.  

## 2012-08-22 ENCOUNTER — Telehealth: Payer: Self-pay

## 2012-08-22 NOTE — Telephone Encounter (Signed)
The patient would like results of her echo.  She is concerned as f/u with cardiology is not until August.

## 2012-08-22 NOTE — Telephone Encounter (Signed)
Patient informed and agreed to schedule appt.

## 2012-08-22 NOTE — Telephone Encounter (Signed)
Echo does show markedly reduced overall heart function with EF (ejection fraction) of 30% (normal is about 60-65%); so she should be taking her lasix every day to avoid worsening swelling;  Since her card appt is not until aug, we should see her this week to further go over her tx, and make med adjustment that may be needed

## 2012-08-24 ENCOUNTER — Encounter: Payer: Self-pay | Admitting: Internal Medicine

## 2012-08-24 ENCOUNTER — Ambulatory Visit (INDEPENDENT_AMBULATORY_CARE_PROVIDER_SITE_OTHER): Payer: Medicare Other | Admitting: Internal Medicine

## 2012-08-24 VITALS — BP 102/72 | HR 89 | Temp 98.8°F | Ht 62.0 in | Wt 100.2 lb

## 2012-08-24 DIAGNOSIS — I959 Hypotension, unspecified: Secondary | ICD-10-CM | POA: Insufficient documentation

## 2012-08-24 DIAGNOSIS — I509 Heart failure, unspecified: Secondary | ICD-10-CM

## 2012-08-24 NOTE — Patient Instructions (Signed)
Please continue all other medications as before Please keep your appointments with your specialists as you have planned No further testing for now  Please remember to sign up for My Chart if you have not done so, as this will be important to you in the future with finding out test results, communicating by private email, and scheduling acute appointments online when needed.

## 2012-08-24 NOTE — Progress Notes (Signed)
Subjective:    Patient ID: Tonya Valdez, female    DOB: 1940/11/16, 72 y.o.   MRN: [id number]  HPI   Here for 2 wk f/u after recent CHF; Wt down from 105 to 100.4 today; peak wt has been as much as 193 per pt years ago, concerned about ongoing wt loss recent years, Pt denies chest pain, increased sob or doe, wheezing, orthopnea, PND, increased LE swelling, palpitations, dizziness or syncope. Has been quite diligent about low sodium diet since last seen. Pt denies new neurological symptoms such as new headache, or facial or extremity weakness or numbness   Pt denies polydipsia, polyuria Still smoking but down to 4 cigs per day.  Has known incidental RLL 8 mm nodule per CT abd/pelvis per urology , due for planned f/u aug 2014.   Pt denies fever, wt loss, night sweats, loss of appetite, or other constitutional symptoms - Denies urinary symptoms such as dysuria, frequency, urgency, flank pain, hematuria or n/v, fever, chills.  Has lower BP today but states BP at home usually increased. Past Medical History  Diagnosis Date  . Positive TB test   . Bladder tumor   . COPD (chronic obstructive pulmonary disease)     PT DENIES SOB  . Diabetes mellitus TYPE II--  PER PCP NOTE DR Oliver Barre    PER PT DOES NOT TAKES METFORMIN PRESCRIBED, SHE WATCHES DIET AND EXERCISES  . Congestive heart failure, unspecified 08/04/2012   Past Surgical History  Procedure Laterality Date  . Tonsillectomy  1959  . Hiatal hernia repair  1964  (AGE 77)    CHOLECYSTECTOMY AND APPENDECTOMY  . Left elbow surgery    . Lumbar disc surgery  1993  . Abdominal hysterectomy  1981 (APPROX)    BILATERAL SALPINGO-OOPHORECTOMY WITH EXCEPTION A SMALL PIECE OF OVARY REMAINS  . Transurethral resection of bladder tumor N/A 06/24/2012    Procedure: TRANSURETHRAL RESECTION OF BLADDER TUMOR  WITH GYRUS AND INSTILLATION OF MITOMYCIN C  (TURBT);  Surgeon: Garnett Farm, MD;  Location: [facility];  Service: Urology;  Laterality:  N/A;    reports that she has been smoking Cigarettes.  She has a 10 pack-year smoking history. She has never used smokeless tobacco. She reports that  drinks alcohol. She reports that she does not use illicit drugs. family history includes Cancer in her other; Cervical cancer in her mother; Colon cancer in her father; Diabetes in her other; Heart disease in her other; Hypertension in her other; Kidney disease in her other; and Lung cancer (age of onset: 72) in her father. No Known Allergies Current Outpatient Prescriptions on File Prior to Visit  Medication Sig Dispense Refill  . aspirin 81 MG tablet Take 81 mg by mouth daily.      . furosemide (LASIX) 20 MG tablet Take 1 tablet (20 mg total) by mouth daily as needed.  90 tablet  3  . Multiple Vitamin (MULTIVITAMIN) tablet Take 1 tablet by mouth daily.      . Omega-3 Fatty Acids (FISH OIL) 1000 MG CAPS Take 1 each by mouth daily.       No current facility-administered medications on file prior to visit.   Review of Systems  Constitutional: Negative for unexpected weight change, or unusual diaphoresis  HENT: Negative for tinnitus.   Eyes: Negative for photophobia and visual disturbance.  Respiratory: Negative for choking and stridor.   Gastrointestinal: Negative for vomiting and blood in stool.  Genitourinary: Negative for hematuria and decreased urine  volume.  Musculoskeletal: Negative for acute joint swelling Skin: Negative for color change and wound.  Neurological: Negative for tremors and numbness other than noted  Psychiatric/Behavioral: Negative for decreased concentration or  hyperactivity.       Objective:   Physical Exam BP 102/72  Pulse 89  Temp(Src) 98.8 F (37.1 C) (Oral)  Ht 5\' 2"  (1.575 m)  Wt 100 lb 4 oz (45.473 kg)  BMI 18.33 kg/m2  SpO2 95% VS noted,  Constitutional: Pt appears well-developed and well-nourished.  HENT: Head: NCAT.  Right Ear: External ear normal.  Left Ear: External ear normal.  Eyes:  Conjunctivae and EOM are normal. Pupils are equal, round, and reactive to light.  Neck: Normal range of motion. Neck supple.  Cardiovascular: Normal rate and regular rhythm.   Pulmonary/Chest: Effort normal and breath sounds decreased, no rales or wheezing.  Neurological: Pt is alert. Not confused  Skin: Skin is warm. No erythema. No LE edema Psychiatric: Pt behavior is normal. Thought content normal.     Assessment & Plan:

## 2012-08-26 NOTE — Assessment & Plan Note (Signed)
stable overall by history and exam, recent data reviewed with pt, and pt to continue medical treatment as before,  to f/u any worsening symptoms or concerns Lab Results  Component Value Date   HGBA1C 7.1* 08/04/2012

## 2012-08-26 NOTE — Assessment & Plan Note (Signed)
Due to lower BP will hold on adding coreg today,  to f/u any worsening symptoms or concerns

## 2012-08-26 NOTE — Assessment & Plan Note (Addendum)
Volume improved now clinically euvolemic, to cont current lasix, echo reviewed with pt, f/u card as planned

## 2012-09-26 ENCOUNTER — Encounter: Payer: Self-pay | Admitting: *Deleted

## 2012-09-26 ENCOUNTER — Encounter: Payer: Self-pay | Admitting: Cardiology

## 2012-09-26 ENCOUNTER — Other Ambulatory Visit (HOSPITAL_COMMUNITY): Payer: Self-pay | Admitting: Cardiology

## 2012-09-26 ENCOUNTER — Ambulatory Visit (INDEPENDENT_AMBULATORY_CARE_PROVIDER_SITE_OTHER): Payer: Medicare Other | Admitting: Cardiology

## 2012-09-26 VITALS — BP 144/77 | HR 90 | Wt 99.0 lb

## 2012-09-26 DIAGNOSIS — I779 Disorder of arteries and arterioles, unspecified: Secondary | ICD-10-CM

## 2012-09-26 DIAGNOSIS — I428 Other cardiomyopathies: Secondary | ICD-10-CM

## 2012-09-26 DIAGNOSIS — I509 Heart failure, unspecified: Secondary | ICD-10-CM

## 2012-09-26 HISTORY — DX: Disorder of arteries and arterioles, unspecified: I77.9

## 2012-09-26 LAB — CBC WITH DIFFERENTIAL/PLATELET
Basophils Relative: 0.5 % (ref 0.0–3.0)
Eosinophils Absolute: 0.3 10*3/uL (ref 0.0–0.7)
Eosinophils Relative: 2.3 % (ref 0.0–5.0)
HCT: 41.5 % (ref 36.0–46.0)
Hemoglobin: 13.7 g/dL (ref 12.0–15.0)
Lymphs Abs: 3.5 10*3/uL (ref 0.7–4.0)
MCHC: 32.9 g/dL (ref 30.0–36.0)
MCV: 91.6 fl (ref 78.0–100.0)
Monocytes Absolute: 0.7 10*3/uL (ref 0.1–1.0)
Neutro Abs: 6.9 10*3/uL (ref 1.4–7.7)
RBC: 4.53 Mil/uL (ref 3.87–5.11)
WBC: 11.4 10*3/uL — ABNORMAL HIGH (ref 4.5–10.5)

## 2012-09-26 LAB — PROTIME-INR: INR: 1 ratio (ref 0.8–1.0)

## 2012-09-26 LAB — BASIC METABOLIC PANEL
CO2: 30 mEq/L (ref 19–32)
Chloride: 102 mEq/L (ref 96–112)
Creatinine, Ser: 0.8 mg/dL (ref 0.4–1.2)
Potassium: 4.4 mEq/L (ref 3.5–5.1)

## 2012-09-26 MED ORDER — PRAVASTATIN SODIUM 40 MG PO TABS: 40.0000 mg | ORAL_TABLET | Freq: Every evening | ORAL | Status: DC

## 2012-09-26 MED ORDER — CARVEDILOL 3.125 MG PO TABS: 3.1250 mg | ORAL_TABLET | Freq: Two times a day (BID) | ORAL | Status: DC

## 2012-09-26 NOTE — Assessment & Plan Note (Signed)
Patient counseled on discontinuing. 

## 2012-09-26 NOTE — Progress Notes (Signed)
HPI: 72 year old female for evaluation of new onset systolic congestive heart failure. Abdominal CT in April of 2014 showed a filling defect in the bladder, bilateral pleural effusions and a right lower lobe nodule. followup recommended. Recently seen by primary care with complaints of lower extremity edema. Echocardiogram in June of 2014 showed an ejection fraction of 30% and severe left ventricular enlargement. There was mild biatrial enlargement and mild mitral regurgitation. Patient first noted mild pedal edema in May. There is no dyspnea on exertion, orthopnea, PND, chest pain, palpitations or syncope.  Current Outpatient Prescriptions  Medication Sig Dispense Refill  . aspirin 81 MG tablet Take 81 mg by mouth daily.      . furosemide (LASIX) 20 MG tablet Take 20 mg by mouth daily.      . metFORMIN (GLUCOPHAGE) 500 MG tablet Take 1 tablet by mouth as needed.      . Multiple Vitamin (MULTIVITAMIN) tablet Take 1 tablet by mouth daily.      . Omega-3 Fatty Acids (FISH OIL) 1000 MG CAPS Take 1 each by mouth daily.       No current facility-administered medications for this visit.    No Known Allergies  Past Medical History  Diagnosis Date  . Positive TB test   . Bladder tumor   . COPD (chronic obstructive pulmonary disease)   . Diabetes mellitus TYPE II--  PER PCP NOTE DR Oliver Barre    PER PT DOES NOT TAKES METFORMIN PRESCRIBED, SHE WATCHES DIET AND EXERCISES  . Congestive heart failure, unspecified 08/04/2012    Past Surgical History  Procedure Laterality Date  . Tonsillectomy  1959  . Hiatal hernia repair  1964  (AGE 66)    CHOLECYSTECTOMY AND APPENDECTOMY  . Left elbow surgery    . Lumbar disc surgery  1993  . Abdominal hysterectomy  1981 (APPROX)    BILATERAL SALPINGO-OOPHORECTOMY WITH EXCEPTION A SMALL PIECE OF OVARY REMAINS  . Transurethral resection of bladder tumor N/A 06/24/2012    Procedure: TRANSURETHRAL RESECTION OF BLADDER TUMOR  WITH GYRUS AND INSTILLATION OF  MITOMYCIN C  (TURBT);  Surgeon: Garnett Farm, MD;  Location: [facility];  Service: Urology;  Laterality: N/A;    History   Social History  . Marital Status: Widowed    Spouse Name: N/A    Number of Children: 2  . Years of Education: 16   Occupational History  . RN, BSN    Social History Main Topics  . Smoking status: Current Every Day Smoker -- 0.20 packs/day for 50 years    Types: Cigarettes  . Smokeless tobacco: Never Used     Comment: PT HAS BEEN QUITTING--  SMOKES 4 CIG'S PER DAY,  DOWN FROM 1PPD  . Alcohol Use: Yes     Comment: occasional wine not often  . Drug Use: No  . Sexually Active: Not on file   Other Topics Concern  . Not on file   Social History Narrative  . No narrative on file    Family History  Problem Relation Age of Onset  . Cancer Other     colon cancer  . Heart disease Mother     MI at age 46  . Hypertension Other   . Kidney disease Other   . Diabetes Other   . Cervical cancer Mother   . Lung cancer Father 57    primary site lung CA then colon and bone  . Colon cancer Father     ROS: no fevers  or chills, productive cough, hemoptysis, dysphasia, odynophagia, melena, hematochezia, dysuria, hematuria, rash, seizure activity, orthopnea, PND, pedal edema, claudication. Remaining systems are negative.  Physical Exam:   Blood pressure 144/77, pulse 90, weight 99 lb (44.906 kg).  General:  Well developed/thin in NAD Skin warm/dry Patient not depressed No peripheral clubbing Back-normal HEENT-normal/normal eyelids Neck supple/normal carotid upstroke bilaterally; bilateral bruits; no JVD; no thyromegaly chest - CTA/ normal expansion CV - RRR/normal S1 and S2; no rubs or gallops;  PMI nondisplaced, 1/6 systolic ejection murmur Abdomen -NT/ND, no HSM, no mass, + bowel sounds, positive bruit 2+ femoral pulse on the left and 1+ on the right, bilateral bruits Ext-no edema, chords, 2+ DP Neuro-grossly nonfocal  ECG Sinus rhythm,  nonspecific ST changes

## 2012-09-26 NOTE — Assessment & Plan Note (Addendum)
Patient presents with newly diagnosed congestive heart failure. Her ejection fraction is 30%. Etiology of cardiomyopathy unclear. No history of alcohol use. Recent TSH normal. She does not have a history of uncontrolled hypertension. She has diffuse bruits and I am concerned about the possibility of coronary artery disease. Plan to schedule left and right cardiac catheterization. The risks and benefits were discussed and the patient agrees to proceed. Note diminished pulse in the right femoral area. Continue aspirin. Add Pravachol 40 mg daily. Check lipids and liver in 6 weeks. Add carvedilol 3.125 mg by mouth twice a day. Check potassium and renal function given the recent addition of Lasix. If renal function preserved add lisinopril. Titrate medications as tolerated by pulse and blood pressure. Hold Lasix the day before her procedure and day of procedure. Hold Glucophage for 48 hours following procedure. Once medications are fully titrated she will need a repeat echocardiogram. His ejection fraction less than 35% would consider ICD.

## 2012-09-26 NOTE — Assessment & Plan Note (Signed)
She will need followup CT as arranged by primary care.

## 2012-09-26 NOTE — Assessment & Plan Note (Signed)
Patient has bilateral carotid bruits, abdominal bruit and femoral bruits. She's not having claudication. Check carotid Dopplers and abdominal ultrasound to exclude aneurysm.

## 2012-09-26 NOTE — Patient Instructions (Addendum)
Your physician recommends that you schedule a follow-up appointment in: 6 WEEKS WITH DR Jens Som  Your physician has requested that you have a cardiac catheterization. Cardiac catheterization is used to diagnose and/or treat various heart conditions. Doctors may recommend this procedure for a number of different reasons. The most common reason is to evaluate chest pain. Chest pain can be a symptom of coronary artery disease (CAD), and cardiac catheterization can show whether plaque is narrowing or blocking your heart's arteries. This procedure is also used to evaluate the valves, as well as measure the blood flow and oxygen levels in different parts of your heart. For further information please visit [email or web address] Please follow instruction sheet, as given.   Your physician has requested that you have a carotid duplex. This test is an ultrasound of the carotid arteries in your neck. It looks at blood flow through these arteries that supply the brain with blood. Allow one hour for this exam. There are no restrictions or special instructions.   Your physician has requested that you have an abdominal aorta duplex. During this test, an ultrasound is used to evaluate the aorta. Allow 30 minutes for this exam. Do not eat after midnight the day before and avoid carbonated beverages   START PRAVACHOL 40 MG ONCE DAILY  Your physician recommends that you return for lab work TODAY AND IN 6 WEEKS  START CARVEDILOL 3.125 MG ONE TABLET TWICE DAILY

## 2012-09-29 ENCOUNTER — Telehealth: Payer: Self-pay | Admitting: Cardiology

## 2012-09-29 ENCOUNTER — Encounter (INDEPENDENT_AMBULATORY_CARE_PROVIDER_SITE_OTHER): Payer: Medicare Other

## 2012-09-29 DIAGNOSIS — I700 Atherosclerosis of aorta: Secondary | ICD-10-CM

## 2012-09-29 DIAGNOSIS — I509 Heart failure, unspecified: Secondary | ICD-10-CM

## 2012-09-29 DIAGNOSIS — R0989 Other specified symptoms and signs involving the circulatory and respiratory systems: Secondary | ICD-10-CM

## 2012-09-29 NOTE — Telephone Encounter (Signed)
New prob  Pt rtned call about lab work

## 2012-09-29 NOTE — Telephone Encounter (Signed)
Spoke with pt, aware of normal labs 

## 2012-10-05 ENCOUNTER — Encounter (INDEPENDENT_AMBULATORY_CARE_PROVIDER_SITE_OTHER): Payer: Medicare Other

## 2012-10-05 DIAGNOSIS — I509 Heart failure, unspecified: Secondary | ICD-10-CM

## 2012-10-05 DIAGNOSIS — I6529 Occlusion and stenosis of unspecified carotid artery: Secondary | ICD-10-CM

## 2012-10-05 DIAGNOSIS — R0989 Other specified symptoms and signs involving the circulatory and respiratory systems: Secondary | ICD-10-CM

## 2012-10-06 ENCOUNTER — Other Ambulatory Visit: Payer: Self-pay | Admitting: *Deleted

## 2012-10-06 ENCOUNTER — Telehealth: Payer: Self-pay | Admitting: Cardiology

## 2012-10-06 ENCOUNTER — Ambulatory Visit (INDEPENDENT_AMBULATORY_CARE_PROVIDER_SITE_OTHER): Payer: Medicare Other | Admitting: Internal Medicine

## 2012-10-06 ENCOUNTER — Encounter: Payer: Self-pay | Admitting: Internal Medicine

## 2012-10-06 ENCOUNTER — Encounter: Payer: Self-pay | Admitting: *Deleted

## 2012-10-06 ENCOUNTER — Ambulatory Visit (INDEPENDENT_AMBULATORY_CARE_PROVIDER_SITE_OTHER)
Admission: RE | Admit: 2012-10-06 | Discharge: 2012-10-06 | Disposition: A | Discharge status: Home / Self Care | Payer: Medicare Other | Source: Ambulatory Visit | Attending: Internal Medicine | Admitting: Internal Medicine

## 2012-10-06 VITALS — BP 120/82 | HR 74 | Temp 98.9°F | Ht 62.0 in | Wt 102.4 lb

## 2012-10-06 DIAGNOSIS — IMO0001 Reserved for inherently not codable concepts without codable children: Secondary | ICD-10-CM

## 2012-10-06 DIAGNOSIS — I2510 Atherosclerotic heart disease of native coronary artery without angina pectoris: Secondary | ICD-10-CM

## 2012-10-06 DIAGNOSIS — F17200 Nicotine dependence, unspecified, uncomplicated: Secondary | ICD-10-CM

## 2012-10-06 DIAGNOSIS — R911 Solitary pulmonary nodule: Secondary | ICD-10-CM

## 2012-10-06 DIAGNOSIS — Z0000 Encounter for general adult medical examination without abnormal findings: Secondary | ICD-10-CM

## 2012-10-06 NOTE — Telephone Encounter (Signed)
New problem   Michelle/[facility] need to know what the diagnosis is for the scan. Please call  Marcelino Duster

## 2012-10-06 NOTE — Assessment & Plan Note (Signed)
For f/u CT today if possible, since she has other eval and tx for vascular dz starting tomorrow, urged to quit smoking

## 2012-10-06 NOTE — Telephone Encounter (Signed)
Office is currently closed 

## 2012-10-06 NOTE — Patient Instructions (Addendum)
Please remember to followup with your GYN for the mammogram (please call [facility] on [address], or [facility] on [address]) Please continue all other medications as before, and refills have been done if requested. Please have the pharmacy call with any other refills you may need. Please keep your appointments with your specialists as you have planned You will be contacted regarding the referral for: CT chest (to see [facility] now) Please continue your efforts at being more active, low cholesterol diet, and weight control. Please stop smoking completely You are otherwise up to date with prevention measures today. No further lab work needed today  Please return in 6 months, or sooner if needed, with Lab testing done 3-5 days before

## 2012-10-06 NOTE — Assessment & Plan Note (Signed)

## 2012-10-06 NOTE — Assessment & Plan Note (Signed)
stable overall by history and exam, recent data reviewed with pt, and pt to continue medical treatment as before,  to f/u any worsening symptoms or concerns Lab Results  Component Value Date   HGBA1C 7.1* 08/04/2012   F/u 6 mo

## 2012-10-06 NOTE — Assessment & Plan Note (Signed)
Urged to quit 

## 2012-10-06 NOTE — Progress Notes (Signed)
Subjective:    Patient ID: Tonya Valdez, female    DOB: [birth year], 72 y.o.   MRN: [id number]  HPI  Here for wellness and f/u;  Overall doing ok;  Pt denies CP, worsening SOB, DOE, wheezing, orthopnea, PND, worsening LE edema, palpitations, dizziness or syncope.  Pt denies neurological change such as new headache, facial or extremity weakness.  Pt denies polydipsia, polyuria, or low sugar symptoms. Pt states overall good compliance with treatment and medications, good tolerability, and has been trying to follow lower cholesterol diet.  Pt denies worsening depressive symptoms, suicidal ideation or panic. No fever, night sweats, wt loss, loss of appetite, or other constitutional symptoms.  Pt states good ability with ADL's, has low fall risk, home safety reviewed and adequate, no other significant changes in hearing or vision, and only occasionally active with exercise. No claudications symptoms or any other pain.  Still smoking but trying to cut back to few cigs per day.   Declines tetanus today.  Due for f/u cysto with urology soon, sp BCG tx (weekly for 6 wks) per Dr Vernie Ammons.  CBG's in the AM ave about 118.  Due for cath tomorrow in prep for carotid dz tx.  Reminds me today she had 8 mm pulm nodule noted RLL on Ct per urology in April 2014, due now for f/u.  Past Medical History  Diagnosis Date  . Positive TB test   . Bladder tumor   . COPD (chronic obstructive pulmonary disease)   . Diabetes mellitus TYPE II--  PER PCP NOTE DR Oliver Barre    PER PT DOES NOT TAKES METFORMIN PRESCRIBED, SHE WATCHES DIET AND EXERCISES  . Congestive heart failure, unspecified 08/04/2012   Past Surgical History  Procedure Laterality Date  . Tonsillectomy  1959  . Hiatal hernia repair  1964  (AGE 57)    CHOLECYSTECTOMY AND APPENDECTOMY  . Left elbow surgery    . Lumbar disc surgery  1993  . Abdominal hysterectomy  1981 (APPROX)    BILATERAL SALPINGO-OOPHORECTOMY WITH EXCEPTION A SMALL PIECE OF OVARY REMAINS  .  Transurethral resection of bladder tumor N/A 06/24/2012    Procedure: TRANSURETHRAL RESECTION OF BLADDER TUMOR  WITH GYRUS AND INSTILLATION OF MITOMYCIN C  (TURBT);  Surgeon: Garnett Farm, MD;  Location: [facility];  Service: Urology;  Laterality: N/A;    reports that she has been smoking Cigarettes.  She has a 10 pack-year smoking history. She has never used smokeless tobacco. She reports that  drinks alcohol. She reports that she does not use illicit drugs. family history includes Cancer in her other; Cervical cancer in her mother; Colon cancer in her father; Diabetes in her other; Heart disease in her mother; Hypertension in her other; Kidney disease in her other; Lung cancer (age of onset: 7) in her father. No Known Allergies Current Outpatient Prescriptions on File Prior to Visit  Medication Sig Dispense Refill  . aspirin 81 MG tablet Take 81 mg by mouth daily.      . carvedilol (COREG) 3.125 MG tablet Take 1 tablet (3.125 mg total) by mouth 2 (two) times daily.  60 tablet  12  . furosemide (LASIX) 20 MG tablet Take 20 mg by mouth daily.      . metFORMIN (GLUCOPHAGE) 500 MG tablet Take 1 tablet by mouth as needed.      . Multiple Vitamin (MULTIVITAMIN) tablet Take 1 tablet by mouth daily.      . Omega-3 Fatty Acids (FISH OIL) 1000  MG CAPS Take 1 each by mouth daily.      . pravastatin (PRAVACHOL) 40 MG tablet Take 1 tablet (40 mg total) by mouth every evening.  30 tablet  11   No current facility-administered medications on file prior to visit.   Review of Systems Constitutional: Negative for diaphoresis, activity change, appetite change or unexpected weight change.  HENT: Negative for hearing loss, ear pain, facial swelling, mouth sores and neck stiffness.   Eyes: Negative for pain, redness and visual disturbance.  Respiratory: Negative for shortness of breath and wheezing.   Cardiovascular: Negative for chest pain and palpitations.  Gastrointestinal: Negative for  diarrhea, blood in stool, abdominal distention or other pain Genitourinary: Negative for hematuria, flank pain or change in urine volume.  Musculoskeletal: Negative for myalgias and joint swelling.  Skin: Negative for color change and wound.  Neurological: Negative for syncope and numbness. other than noted Hematological: Negative for adenopathy.  Psychiatric/Behavioral: Negative for hallucinations, self-injury, decreased concentration and agitation.      Objective:   Physical Exam BP 120/82  Pulse 74  Temp(Src) 98.9 F (37.2 C) (Oral)  Ht 5\' 2"  (1.575 m)  Wt 102 lb 6 oz (46.437 kg)  BMI 18.72 kg/m2  SpO2 98% VS noted, thin but not frail, seems vigorous today Constitutional: Pt is oriented to person, place, and time. Appears well-developed and well-nourished.  Head: Normocephalic and atraumatic.  Right Ear: External ear normal.  Left Ear: External ear normal.  Nose: Nose normal.  Mouth/Throat: Oropharynx is clear and moist.  Eyes: Conjunctivae and EOM are normal. Pupils are equal, round, and reactive to light.  Neck: Normal range of motion. Neck supple. No JVD present. No tracheal deviation present.  Cardiovascular: Normal rate, regular rhythm, normal heart sounds and intact distal pulses.   Pulmonary/Chest: Effort normal and breath sounds decreased.  Abdominal: Soft. Bowel sounds are normal. There is no tenderness. No HSM  Musculoskeletal: Normal range of motion. Exhibits no edema.  Lymphadenopathy:  Has no cervical adenopathy.  Neurological: Pt is alert and oriented to person, place, and time. Pt has normal reflexes. No cranial nerve deficit.  Skin: Skin is warm and dry. No rash noted.  Psychiatric:  Has  normal mood and affect. Behavior is normal. Concerned about her health but not dysphoric or overly nervous     Assessment & Plan:

## 2012-10-07 ENCOUNTER — Encounter (HOSPITAL_BASED_OUTPATIENT_CLINIC_OR_DEPARTMENT_OTHER): Payer: Self-pay

## 2012-10-07 ENCOUNTER — Encounter (HOSPITAL_BASED_OUTPATIENT_CLINIC_OR_DEPARTMENT_OTHER)
Admission: RE | Disposition: A | Discharge status: Home / Self Care | Payer: Self-pay | Source: Ambulatory Visit | Attending: Internal Medicine

## 2012-10-07 ENCOUNTER — Encounter: Payer: Self-pay | Admitting: Surgery

## 2012-10-07 ENCOUNTER — Other Ambulatory Visit: Payer: Self-pay | Admitting: *Deleted

## 2012-10-07 ENCOUNTER — Encounter: Payer: Medicare Other | Admitting: Internal Medicine

## 2012-10-07 ENCOUNTER — Inpatient Hospital Stay (HOSPITAL_BASED_OUTPATIENT_CLINIC_OR_DEPARTMENT_OTHER)
Admission: RE | Admit: 2012-10-07 | Discharge: 2012-10-07 | Disposition: A | Discharge status: Home / Self Care | Payer: Medicare Other | Source: Ambulatory Visit | Attending: Internal Medicine | Admitting: Internal Medicine

## 2012-10-07 DIAGNOSIS — I2510 Atherosclerotic heart disease of native coronary artery without angina pectoris: Secondary | ICD-10-CM

## 2012-10-07 DIAGNOSIS — I6529 Occlusion and stenosis of unspecified carotid artery: Secondary | ICD-10-CM | POA: Insufficient documentation

## 2012-10-07 DIAGNOSIS — I5020 Unspecified systolic (congestive) heart failure: Secondary | ICD-10-CM | POA: Insufficient documentation

## 2012-10-07 DIAGNOSIS — F17200 Nicotine dependence, unspecified, uncomplicated: Secondary | ICD-10-CM | POA: Insufficient documentation

## 2012-10-07 DIAGNOSIS — I2589 Other forms of chronic ischemic heart disease: Secondary | ICD-10-CM | POA: Insufficient documentation

## 2012-10-07 DIAGNOSIS — I658 Occlusion and stenosis of other precerebral arteries: Secondary | ICD-10-CM | POA: Insufficient documentation

## 2012-10-07 DIAGNOSIS — I428 Other cardiomyopathies: Secondary | ICD-10-CM

## 2012-10-07 DIAGNOSIS — R911 Solitary pulmonary nodule: Secondary | ICD-10-CM | POA: Insufficient documentation

## 2012-10-07 DIAGNOSIS — I739 Peripheral vascular disease, unspecified: Secondary | ICD-10-CM

## 2012-10-07 DIAGNOSIS — E119 Type 2 diabetes mellitus without complications: Secondary | ICD-10-CM | POA: Insufficient documentation

## 2012-10-07 DIAGNOSIS — J4489 Other specified chronic obstructive pulmonary disease: Secondary | ICD-10-CM | POA: Insufficient documentation

## 2012-10-07 DIAGNOSIS — I509 Heart failure, unspecified: Secondary | ICD-10-CM | POA: Insufficient documentation

## 2012-10-07 DIAGNOSIS — I2789 Other specified pulmonary heart diseases: Secondary | ICD-10-CM | POA: Insufficient documentation

## 2012-10-07 DIAGNOSIS — J449 Chronic obstructive pulmonary disease, unspecified: Secondary | ICD-10-CM | POA: Insufficient documentation

## 2012-10-07 LAB — POCT I-STAT 3, VENOUS BLOOD GAS (G3P V)
Acid-base deficit: 2 mmol/L (ref 0.0–2.0)
Bicarbonate: 23.9 mEq/L (ref 20.0–24.0)
O2 Saturation: 50 %
TCO2: 25 mmol/L (ref 0–100)
TCO2: 25 mmol/L (ref 0–100)
pCO2, Ven: 42.6 mmHg — ABNORMAL LOW (ref 45.0–50.0)
pO2, Ven: 27 mmHg — CL (ref 30.0–45.0)

## 2012-10-07 LAB — POCT I-STAT GLUCOSE: Glucose, Bld: 123 mg/dL — ABNORMAL HIGH (ref 70–99)

## 2012-10-07 LAB — POCT I-STAT 3, ART BLOOD GAS (G3+)
Bicarbonate: 23.5 mEq/L (ref 20.0–24.0)
O2 Saturation: 94 %
TCO2: 25 mmol/L (ref 0–100)
pCO2 arterial: 37.1 mmHg (ref 35.0–45.0)
pO2, Arterial: 69 mmHg — ABNORMAL LOW (ref 80.0–100.0)

## 2012-10-07 SURGERY — JV LEFT AND RIGHT HEART CATHETERIZATION WITH CORONARY ANGIOGRAM
Anesthesia: Moderate Sedation

## 2012-10-07 MED ORDER — SODIUM CHLORIDE 0.9 % IV SOLN: INTRAVENOUS | Status: DC

## 2012-10-07 MED ORDER — ONDANSETRON HCL 4 MG/2ML IJ SOLN: 4.0000 mg | Freq: Four times a day (QID) | INTRAMUSCULAR | Status: DC | PRN

## 2012-10-07 MED ORDER — SODIUM CHLORIDE 0.9 % IJ SOLN: 3.0000 mL | INTRAMUSCULAR | Status: DC | PRN

## 2012-10-07 MED ORDER — SODIUM CHLORIDE 0.9 % IV SOLN: 1.0000 mL/kg/h | INTRAVENOUS | Status: DC

## 2012-10-07 MED ORDER — SODIUM CHLORIDE 0.9 % IJ SOLN: 3.0000 mL | Freq: Two times a day (BID) | INTRAMUSCULAR | Status: DC

## 2012-10-07 MED ORDER — ASPIRIN 81 MG PO CHEW: 324.0000 mg | CHEWABLE_TABLET | ORAL | Status: DC

## 2012-10-07 MED ORDER — ACETAMINOPHEN 325 MG PO TABS: 650.0000 mg | ORAL_TABLET | ORAL | Status: DC | PRN

## 2012-10-07 MED ORDER — SODIUM CHLORIDE 0.9 % IV SOLN: 250.0000 mL | INTRAVENOUS | Status: DC | PRN

## 2012-10-07 MED ORDER — POTASSIUM CHLORIDE ER 10 MEQ PO TBCR: 20.0000 meq | EXTENDED_RELEASE_TABLET | Freq: Two times a day (BID) | ORAL | Status: DC

## 2012-10-07 MED ORDER — FUROSEMIDE 20 MG PO TABS: 40.0000 mg | ORAL_TABLET | Freq: Every day | ORAL | Status: DC

## 2012-10-07 MED ORDER — DIAZEPAM 2 MG PO TABS
2.0000 mg | ORAL_TABLET | ORAL | Status: AC
  Administered 2012-10-07: 2 mg via ORAL

## 2012-10-07 NOTE — Progress Notes (Signed)
7FR sheath removed form right femoral vein and 4 fr sheath removed from the right femoral artery.  Pt alert and denies any discomfort at this time.

## 2012-10-07 NOTE — OR Nursing (Signed)
Discharge instructions reviewed and signed, pt stated understanding, ambulated in hall without difficulty, site level 0, transported to friend's car via wheelchair 

## 2012-10-07 NOTE — OR Nursing (Signed)
Tegaderm dressing applied, site level 0, bedrest begins at 1135 

## 2012-10-07 NOTE — OR Nursing (Signed)
Dr Bensimhon at bedside to discuss results and treatment plan with pt and family 

## 2012-10-07 NOTE — Interval H&P Note (Signed)
History and Physical Interval Note:  10/07/2012 9:42 AM  Tonya Valdez  has presented today for surgery, with the diagnosis of CHF.  The various methods of treatment have been discussed with the patient and family. After consideration of risks, benefits and other options for treatment, the patient has consented to  Procedure(s): JV LEFT AND RIGHT HEART CATHETERIZATION WITH CORONARY ANGIOGRAM (N/A) as a surgical intervention .  The patient's history has been reviewed, patient examined, no change in status, stable for surgery.  I have reviewed the patient's chart and labs.  Questions were answered to the patient's satisfaction.     Daniel Bensimhon

## 2012-10-07 NOTE — CV Procedure (Addendum)
Cardiac Cath Procedure Note  Indication: Heart failure  Procedures performed:  1) Right heart cathererization 2) Selective coronary angiography 3) Left heart catheterization 4) Left ventriculogram 5) Abdominal aortogram 6) L subclavian angiography (needs CABG)   Description of procedure:     The risks and indication of the procedure were explained. Consent was signed and placed on the chart. An appropriate timeout was taken prior to the procedure. The right groin was prepped and draped in the routine sterile fashion and anesthetized with 1% local lidocaine.    A 5 FR arterial sheath was placed in the right femoral artery using a modified Seldinger technique.  The femoral artery was heavily calcified and required a Wholley wire to navigate into the central aorta. We did high wire exchanges after that.Standard catheters including a JL4, JR4 and angled pigtail were used. All catheter exchanges were made over a wire. A 7 FR venous sheath was placed in the right femoral vein using a modified Seldinger technique. A standard Swan-Ganz catheter was used for the procedure.   Complications:  None apparent  Findings:  RA =  5 RV = 55/6/7 PA =  60/22 (39) PCW = 30/41/27 Fick cardiac output/index = 2.1/1.5 Thermo CO/CI = 3.0/2.1 PVR = 5.8 FA sat = 94% PA sat = 46% 50%  Ao Pressure: 150/59/96 LV Pressure: 154/33/20 There was no signficant gradient across the aortic valve on pullback.  Left main: Heavily calcified with 60% ostial lesion. Probably 40-50% distal  LAD: Heavily calcified throughout with probable 40-50% ostial lesion contiguous with ML plaque. Diffuse 40-50% throughout with 70-80% mid . Severe disease at the apex. Extensive septal arcade providing collaterals to distal RCA.   LCX:. Heavily caclified. Subtotal occlusion proximally. Mid LCX diffuse 80-90 lesion. Distal vessel fills with TIMI-1 flow  RCA: Totally occluded ostially. Mid to distal vessel fills well from LAD  collaterals  L subclavian: 40% ostially with moderate diffuse disease  LV-gram done in the RAO projection: Ejection fraction = 25-30% diffuse HK  Abdomina aorta: Abdominal aorta is diffusely disease with severe ulcerated plaquing. Both iliacs are diseased. R>L. The R iliac has ostial 95% lesion. Left renal long 60-70% lesion. Right ok.   Assessment: 1. 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 Severe PAD as described above 4. 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 COPD with ongoing tobacco use and pulmonary nodule 6. Recently discovered bilateral carotid stenosis > 90%  Plan/Discussion:  She has severe 3-V CAD (and PAD) with DM2 and significant LV dysfunction. Ideally will need CABG but I am not sure she will be a candidate due to comorbidities. Will increase lasix to 40mg  daily and add KCl 20 daily. Given that she is symptom-free, I think she is table for d/c home with close outpatient f/u. I have called for outpatient [facility] referral.  Reuel Boom Tyra Gural,MD 11:06 AM

## 2012-10-07 NOTE — Telephone Encounter (Signed)
Spoke with michelle, she was questioning what the diagnosis for the pt having a carotid doppler in their office. Explained the pt does not need a doppler the pt needs consult for carotid stenosis. The doppler should have been faxed to their office. She voiced understanding.

## 2012-10-07 NOTE — H&P (View-Only) (Signed)
HPI: 72-year-old female for evaluation of new onset systolic congestive heart failure. Abdominal CT in April of 2014 showed a filling defect in the bladder, bilateral pleural effusions and a right lower lobe nodule. followup recommended. Recently seen by primary care with complaints of lower extremity edema. Echocardiogram in June of 2014 showed an ejection fraction of 30% and severe left ventricular enlargement. There was mild biatrial enlargement and mild mitral regurgitation. Patient first noted mild pedal edema in May. There is no dyspnea on exertion, orthopnea, PND, chest pain, palpitations or syncope.  Current Outpatient Prescriptions  Medication Sig Dispense Refill  . aspirin 81 MG tablet Take 81 mg by mouth daily.      . furosemide (LASIX) 20 MG tablet Take 20 mg by mouth daily.      . metFORMIN (GLUCOPHAGE) 500 MG tablet Take 1 tablet by mouth as needed.      . Multiple Vitamin (MULTIVITAMIN) tablet Take 1 tablet by mouth daily.      . Omega-3 Fatty Acids (FISH OIL) 1000 MG CAPS Take 1 each by mouth daily.       No current facility-administered medications for this visit.    No Known Allergies  Past Medical History  Diagnosis Date  . Positive TB test   . Bladder tumor   . COPD (chronic obstructive pulmonary disease)   . Diabetes mellitus TYPE II--  PER PCP NOTE DR JAMES JOHN    PER PT DOES NOT TAKES METFORMIN PRESCRIBED, SHE WATCHES DIET AND EXERCISES  . Congestive heart failure, unspecified 08/04/2012    Past Surgical History  Procedure Laterality Date  . Tonsillectomy  1959  . Hiatal hernia repair  1964  (AGE 22)    CHOLECYSTECTOMY AND APPENDECTOMY  . Left elbow surgery    . Lumbar disc surgery  1993  . Abdominal hysterectomy  1981 (APPROX)    BILATERAL SALPINGO-OOPHORECTOMY WITH EXCEPTION A SMALL PIECE OF OVARY REMAINS  . Transurethral resection of bladder tumor N/A 06/24/2012    Procedure: TRANSURETHRAL RESECTION OF BLADDER TUMOR  WITH GYRUS AND INSTILLATION OF  MITOMYCIN C  (TURBT);  Surgeon: Mark C Ottelin, MD;  Location: [facility];  Service: Urology;  Laterality: N/A;    History   Social History  . Marital Status: Widowed    Spouse Name: N/A    Number of Children: 2  . Years of Education: 16   Occupational History  . RN, BSN    Social History Main Topics  . Smoking status: Current Every Day Smoker -- 0.20 packs/day for 50 years    Types: Cigarettes  . Smokeless tobacco: Never Used     Comment: PT HAS BEEN QUITTING--  SMOKES 4 CIG'S PER DAY,  DOWN FROM 1PPD  . Alcohol Use: Yes     Comment: occasional wine not often  . Drug Use: No  . Sexually Active: Not on file   Other Topics Concern  . Not on file   Social History Narrative  . No narrative on file    Family History  Problem Relation Age of Onset  . Cancer Other     colon cancer  . Heart disease Mother     MI at age 70  . Hypertension Other   . Kidney disease Other   . Diabetes Other   . Cervical cancer Mother   . Lung cancer Father 54    primary site lung CA then colon and bone  . Colon cancer Father     ROS: no fevers   or chills, productive cough, hemoptysis, dysphasia, odynophagia, melena, hematochezia, dysuria, hematuria, rash, seizure activity, orthopnea, PND, pedal edema, claudication. Remaining systems are negative.  Physical Exam:   Blood pressure 144/77, pulse 90, weight 99 lb (44.906 kg).  General:  Well developed/thin in NAD Skin warm/dry Patient not depressed No peripheral clubbing Back-normal HEENT-normal/normal eyelids Neck supple/normal carotid upstroke bilaterally; bilateral bruits; no JVD; no thyromegaly chest - CTA/ normal expansion CV - RRR/normal S1 and S2; no rubs or gallops;  PMI nondisplaced, 1/6 systolic ejection murmur Abdomen -NT/ND, no HSM, no mass, + bowel sounds, positive bruit 2+ femoral pulse on the left and 1+ on the right, bilateral bruits Ext-no edema, chords, 2+ DP Neuro-grossly nonfocal  ECG Sinus rhythm,  nonspecific ST changes   

## 2012-10-10 ENCOUNTER — Telehealth: Payer: Self-pay | Admitting: Cardiology

## 2012-10-10 ENCOUNTER — Other Ambulatory Visit: Payer: Medicare Other

## 2012-10-10 ENCOUNTER — Encounter: Payer: Medicare Other | Admitting: Surgery

## 2012-10-10 ENCOUNTER — Other Ambulatory Visit: Payer: Self-pay | Admitting: *Deleted

## 2012-10-10 NOTE — Telephone Encounter (Signed)
error 

## 2012-10-11 ENCOUNTER — Encounter: Payer: Self-pay | Admitting: Surgery

## 2012-10-11 ENCOUNTER — Institutional Professional Consult (permissible substitution) (INDEPENDENT_AMBULATORY_CARE_PROVIDER_SITE_OTHER): Payer: Medicare Other | Admitting: Surgery

## 2012-10-11 VITALS — BP 123/74 | HR 79 | Resp 16 | Ht 62.0 in | Wt 99.0 lb

## 2012-10-11 DIAGNOSIS — I2510 Atherosclerotic heart disease of native coronary artery without angina pectoris: Secondary | ICD-10-CM

## 2012-10-11 DIAGNOSIS — I779 Disorder of arteries and arterioles, unspecified: Secondary | ICD-10-CM

## 2012-10-12 ENCOUNTER — Encounter: Payer: Self-pay | Admitting: Surgery

## 2012-10-12 NOTE — Progress Notes (Signed)
[address]       [name] [address]             [phone]      Cardiothoracic Surgical Consultation:   PCP is Oliver Barre, MD Referring Provider is Arvilla Meres, MD  Chief Complaint  Patient presents with  . Coronary Artery Disease    eval for CABG....referred by Dr. 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 She underwent TURBT by Dr. 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 She has been undergoing BCG treatments. She also developed some swelling in her feet and was treated for CHF. An echo showed and LVEF of 30% with severe LV dilatation. She was referred to Dr. Jens Som and underwent cath by Dr. Gala Romney recently showing moderate pulmonary HTN with low cardiac index, moderate LM and severe 3-vessel coronary disease with an EF of 25-30%. She has severe aorto-iliac disease. Carotid dopplers showed bilateral high grade carotid stenosis with 80-99% RICA (high end of range) and 59-80% (high end of range) LICA stenosis. She also was found to have a right lower lobe nodule on a CT of the abdomen in April and a CT of the chest on 10/06/2012 shows bilateral pulmonary nodules. She says she feels fine and denies any symptoms and is very active, still working as a Patent examiner.  Past Medical History  Diagnosis Date  . Positive TB test   . Bladder tumor   . COPD (chronic obstructive pulmonary disease)   . Diabetes mellitus TYPE II--  PER PCP NOTE DR Oliver Barre    PER PT DOES NOT TAKES METFORMIN PRESCRIBED, SHE WATCHES DIET AND EXERCISES  . Congestive heart failure, unspecified 08/04/2012  . Bilateral carotid artery disease 09/26/2012    Past Surgical History  Procedure Laterality Date  . Tonsillectomy  1959  . Hiatal hernia repair  1964  (AGE 39)    CHOLECYSTECTOMY AND  APPENDECTOMY  . Left elbow surgery    . Lumbar disc surgery  1993  . Abdominal hysterectomy  1981 (APPROX)    BILATERAL SALPINGO-OOPHORECTOMY WITH EXCEPTION A SMALL PIECE OF OVARY REMAINS  . Transurethral resection of bladder tumor N/A 06/24/2012    Procedure: TRANSURETHRAL RESECTION OF BLADDER TUMOR  WITH GYRUS AND INSTILLATION OF MITOMYCIN C  (TURBT);  Surgeon: Garnett Farm, MD;  Location: [facility];  Service: Urology;  Laterality: N/A;    Family History  Problem Relation Age of Onset  . Cancer Other     colon cancer  . Heart disease Mother     MI at age 82  . Hypertension Other   . Kidney disease Other   . Diabetes Other   . Cervical cancer Mother   . Lung cancer Father 66    primary site lung CA then colon and bone  . Colon cancer Father     Social History History  Substance Use Topics  . Smoking status: Current Every Day Smoker -- 0.20 packs/day for 50 years    Types: Cigarettes  . Smokeless tobacco: Never Used     Comment: PT HAS BEEN QUITTING--  SMOKES 4 CIG'S PER DAY,  DOWN FROM 1PPD  . Alcohol Use: Yes     Comment: occasional wine not often    Current Outpatient Prescriptions  Medication Sig Dispense Refill  .  aspirin 81 MG tablet Take 81 mg by mouth daily.      . carvedilol (COREG) 3.125 MG tablet Take 1 tablet (3.125 mg total) by mouth 2 (two) times daily.  60 tablet  12  . furosemide (LASIX) 20 MG tablet Take 2 tablets (40 mg total) by mouth daily.  30 tablet  3  . metFORMIN (GLUCOPHAGE) 500 MG tablet Take 1 tablet by mouth as needed.      . Multiple Vitamin (MULTIVITAMIN) tablet Take 1 tablet by mouth daily.      . Omega-3 Fatty Acids (FISH OIL) 1000 MG CAPS Take 1 each by mouth daily.      . potassium chloride (K-DUR) 10 MEQ tablet Take 2 tablets (20 mEq total) by mouth 2 (two) times daily.  30 tablet  3  . pravastatin (PRAVACHOL) 40 MG tablet Take 1 tablet (40 mg total) by mouth every evening.  30 tablet  11   No current  facility-administered medications for this visit.    No Known Allergies  Review of Systems  Constitutional: Positive for unexpected weight change. Negative for fever, appetite change and fatigue.       Lost 25 lbs over the past year but says she has been watching her diet closely.  HENT: Negative.        Wears dentures.  Eyes: Negative.        Wears glasses  Respiratory: Negative for cough, chest tightness and shortness of breath.   Cardiovascular: Positive for leg swelling. Negative for chest pain and palpitations.       Had leg swelling but that has resolved with treatment.  Gastrointestinal: Negative.   Endocrine: Negative.   Genitourinary: Positive for hematuria.       Bladder cancer  No hematuria since TURBT  Musculoskeletal: Negative.   Skin: Negative.   Allergic/Immunologic: Negative.   Neurological: Negative.   Hematological: Negative.   Psychiatric/Behavioral: Negative.     BP 123/74  Pulse 79  Resp 16  Ht 5\' 2"  (1.575 m)  Wt 99 lb (44.906 kg)  BMI 18.1 kg/m2  SpO2 98% Physical Exam  Constitutional: She is oriented to person, place, and time.  Thin WF in no distress.  HENT:  Head: Normocephalic and atraumatic.  Mouth/Throat: Oropharynx is clear and moist.  Eyes: Conjunctivae and EOM are normal. Pupils are equal, round, and reactive to light.  Neck: Normal range of motion. Neck supple. No JVD present. No tracheal deviation present. No thyromegaly present.  Bilateral cervical bruits  Cardiovascular: Normal rate and regular rhythm.   Murmur heard. 1/6 systolic murmur over aorta  Bilateral femoral bruits.  Pedal pulses palpable but diminished.  Pulmonary/Chest: Effort normal. She has no wheezes. She has no rales.  Distant breath sounds throughout  Abdominal: Soft. Bowel sounds are normal. She exhibits no distension and no mass. There is no tenderness.  Abdominal bruit over aorta  Musculoskeletal: Normal range of motion. She exhibits no edema and no  tenderness.  Lymphadenopathy:    She has no cervical adenopathy.  Neurological: She is alert and oriented to person, place, and time. She has normal strength. No cranial nerve deficit or sensory deficit.  Skin: Skin is warm and dry.  Psychiatric: She has a normal mood and affect.     Diagnostic Tests:   [facility]                    [address]  [place], [place] [address]                         [phone]   ------------------------------------------------------------ Transthoracic Echocardiography  Patient:    Shanaia, Sievers MR #:       [id number] Study Date: 08/17/2012 Gender:     F Age:        71 Height:     157.5cm Weight:     47.6kg BSA:        1.76m^2 Pt. Status: Room:    SONOGRAPHER  Luvenia Redden, RDCS  ATTENDING    Mariann Laster  REFERRING    Corwin Levins  PERFORMING   [facility] cc:  ------------------------------------------------------------ LV EF: 30%  ------------------------------------------------------------ Indications:      Dyspnea 786.09.  ------------------------------------------------------------ History:   PMH:  Acquired from the patient and from the patient's chart.  Dyspnea and bilateral lower extremity edema.  Chronic obstructive pulmonary disease.  Risk factors:  Diabetes mellitus.  ------------------------------------------------------------ Study Conclusions  - Left ventricle: The cavity size was severely dilated. Wall   thickness was normal. The estimated ejection fraction was   30%. Diffuse hypokinesis. - Mitral valve: Mild regurgitation. - Left atrium: The atrium was mildly dilated. - Right atrium: The atrium was mildly dilated. - Atrial septum: No defect or patent foramen ovale was   identified. - Pulmonary arteries: PA peak pressure: 31mm Hg (S). Transthoracic echocardiography.  M-mode, complete 2D, spectral Doppler, and color Doppler.  Height:   Height: 157.5cm. Height: 62in.  Weight:  Weight: 47.6kg. Weight: 104.8lb.  Body mass index:  BMI: 19.2kg/m^2.  Body surface area:    BSA: 1.74m^2.  Blood pressure:     150/88.  Patient status:  Outpatient.  Location:  [facility]  ------------------------------------------------------------  ------------------------------------------------------------ Left ventricle:  The cavity size was severely dilated. Wall thickness was normal. The estimated ejection fraction was 30%. Diffuse hypokinesis.  ------------------------------------------------------------ Aortic valve:   Mildly calcified leaflets.  ------------------------------------------------------------ Mitral valve:   Doppler:   Mild regurgitation.    Peak gradient: 7mm Hg (D).  ------------------------------------------------------------ Left atrium:  The atrium was mildly dilated.  ------------------------------------------------------------ Atrial septum:  No defect or patent foramen ovale was identified.  ------------------------------------------------------------ Right ventricle:  The cavity size was normal. Wall thickness was normal. Systolic function was normal.  ------------------------------------------------------------ Pulmonic valve:    Doppler:   Mild regurgitation.  ------------------------------------------------------------ Tricuspid valve:   Doppler:   Mild regurgitation.  ------------------------------------------------------------ Right atrium:  The atrium was mildly dilated.  ------------------------------------------------------------ Pericardium:  The pericardium was normal in appearance.  ------------------------------------------------------------  2D measurements        Normal  Doppler               Normal Left ventricle                 measurements LVID ED,   49.5 mm     43-52   Main pulmonary chord,                         artery PLAX                           Pressure, S    31 mm   =30 LVID ES,   44.4 mm     23-38  Hg chord,                         Left ventricle PLAX                           Ea, lat      4.61 cm/ ------- FS, chord,   10 %      >29     ann, tiss         s PLAX                           DP LVPW, ED    8.9 mm     ------  E/Ea, lat   28.63     ------- IVS/LVPW   0.97        <1.3    ann, tiss ratio, ED                      DP Ventricular septum             Ea, med      3.51 cm/ ------- IVS, ED    8.65 mm     ------  ann, tiss         s Aorta                          DP Root diam,   30 mm     ------  E/Ea, med   37.61     ------- ED                             ann, tiss Left atrium                    DP AP dim       30 mm     ------  LVOT AP dim     2.07 cm/m^2 <2.2    Peak vel, S  51.1 cm/ ------- index                                            s                                VTI, S       8.89 cm  -------                                Mitral valve                                Peak E vel    132 cm/ -------                                                  s  Peak A vel   44.4 cm/ -------                                                  s                                Deceleratio   155 ms  150-230                                n time                                Peak            7 mm  -------                                gradient, D       Hg                                Peak E/A        3     -------                                ratio                                Regurg       23.1 cm/ -------                                alias vel,        s                                PISA                                Max regurg    499 cm/ -------                                vel               s                                Regurg VTI    148 cm  -------                                ERO, PISA    0.14 cm^ -------  2                                 Regurg vol,    21 ml  -------                                PISA                                Tricuspid valve                                Regurg peak   255 cm/ -------                                vel               s                                Peak RV-RA     26 mm  -------                                gradient, S       Hg                                Systemic veins                                Estimated       5 mm  -------                                CVP               Hg                                Right ventricle                                Pressure, S    31 mm  <30                                                  Hg                                Sa vel, lat   9.1 cm/ -------                                ann, tiss         s  DP   ------------------------------------------------------------ Prepared and Electronically Authenticated by  Charlton Haws 2014-06-25T15:13:20.560      Cardiac Cath:  Findings:  RA =  5 RV = 55/6/7 PA =  60/22 (39) PCW = 30/41/27 Fick cardiac output/index = 2.1/1.5 Thermo CO/CI = 3.0/2.1 PVR = 5.8 FA sat = 94% PA sat = 46% 50%  Ao Pressure: 150/59/96 LV Pressure: 154/33/20 There was no signficant gradient across the aortic valve on pullback.  Left main: Heavily calcified with 60% ostial lesion. Probably 40-50% distal  LAD: Heavily calcified throughout with probable 40-50% ostial lesion contiguous with ML plaque. Diffuse 40-50% throughout with 70-80% mid . Severe disease at the apex. Extensive septal arcade providing collaterals to distal RCA.   LCX:. Heavily caclified. Subtotal occlusion proximally. Mid LCX diffuse 80-90 lesion. Distal vessel fills with TIMI-1 flow  RCA: Totally occluded ostially. Mid to distal vessel fills well from LAD collaterals  L subclavian: 40% ostially with moderate diffuse disease  LV-gram done in the RAO projection: Ejection fraction = 25-30%  diffuse HK  Abdomina aorta: Abdominal aorta is diffusely disease with severe ulcerated plaquing. Both iliacs are diseased. R>L. The R iliac has ostial 95% lesion. Left renal long 60-70% lesion. Right ok.   Assessment: 1. 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 Severe PAD as described above 4. 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 COPD with ongoing tobacco use and pulmonary nodule 6. Recently discovered bilateral carotid stenosis > 90%  Plan/Discussion:  She has severe 3-V CAD (and PAD) with DM2 and significant LV dysfunction. Ideally will need CABG but I am not sure she will be a candidate due to comorbidities. Will increase lasix to 40mg  daily and add KCl 20 daily. Given that she is symptom-free, I think she is table for d/c home with close outpatient f/u. I have called for outpatient [facility] referral.    Impression:  She has significant LM and severe 3-vessel coronary artery disease with severe LV dysfunction presenting with mild lower extremity edema but no other symptoms. With an EF of 25-30% I think CABG is probably indicated to prevent further deterioration of cardiac function and progressive congestive heart failure. She also has other problems including bilateral carotid disease that may be critical on the right. This will need to be evaluated by vascular surgery. She has severe aortoiliac disease that may preclude use of a IABP if needed. She has bilateral pulmonary nodules that are too small to biopsy at this time and I think a PET is probably premature at this point. She is at increased risk for lung cancer given her smoking history so these will require close followup with a repeat CT scan in 4-6 months. I discussed CABG surgery with her and her daughter including alternatives, benefits and risks. I answered all of their questions. She would like to see the vascular surgeon and think about this a little more before deciding  about surgery. I stressed the importance of quitting smoking.  Plan:  She will keep her appt with vascular surgery and will let me know what she decides about CABG after that. She may require concomitant CEA and CABG. I will arrange follow-up of the bilateral lung nodules with CT scan in 6 months.

## 2012-10-17 ENCOUNTER — Encounter: Payer: Self-pay | Admitting: Vascular Surgery

## 2012-10-18 ENCOUNTER — Other Ambulatory Visit: Payer: Self-pay

## 2012-10-18 ENCOUNTER — Other Ambulatory Visit: Payer: Self-pay | Admitting: *Deleted

## 2012-10-18 ENCOUNTER — Encounter: Payer: Self-pay | Admitting: Vascular Surgery

## 2012-10-18 ENCOUNTER — Ambulatory Visit (INDEPENDENT_AMBULATORY_CARE_PROVIDER_SITE_OTHER): Payer: Medicare Other | Admitting: Vascular Surgery

## 2012-10-18 DIAGNOSIS — I6529 Occlusion and stenosis of unspecified carotid artery: Secondary | ICD-10-CM | POA: Insufficient documentation

## 2012-10-18 DIAGNOSIS — I2510 Atherosclerotic heart disease of native coronary artery without angina pectoris: Secondary | ICD-10-CM

## 2012-10-18 NOTE — Progress Notes (Signed)
[facility]   Patient name: Tonya Valdez MRN: [id number] DOB: 03/09/1940 Sex: female   Referred by: John  Reason for referral:  Chief Complaint  Patient presents with  . New Evaluation    Dr. Bartle wants to do combination surgery for carotid stenosis     HISTORY OF PRESENT ILLNESS: The patient presents today for evaluation of asymptomatic severe carotid disease. 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 She was found to have some pulmonary symptoms of congestive failure and underwent evaluation to include the stress test and eventual cardiac catheterization. She was found to have severe coronary disease and is scheduled for coronary bypass grafting. She does not have any history of prior stroke amaurosis fugax or transient ischemic attack. She underwent carotid duplex at her cardiology office and this showed severe right and moderate to severe left carotid stenosis. She is right-handed. The internal carotid artery becomes normal above the level of the plaque. She also was found to have calcified iliac arteries with moderate distal aortic stenosis and severe bilateral common iliac artery stenoses. She has no symptoms of claudication and no history of prior atheroemboli or tissue loss.  Past Medical History  Diagnosis Date  . Positive TB test   . Bladder tumor   . COPD (chronic obstructive pulmonary disease)   . Diabetes mellitus TYPE II--  PER PCP NOTE DR JAMES JOHN    PER PT DOES NOT TAKES METFORMIN PRESCRIBED, SHE WATCHES DIET AND EXERCISES  . Congestive heart failure, unspecified 08/04/2012  . Bilateral carotid artery disease 09/26/2012  . CAD (coronary artery disease)   . Cancer     bladder    Past Surgical History  Procedure Laterality Date  . Tonsillectomy  1959  . Hiatal hernia repair  1964  (AGE 22)    CHOLECYSTECTOMY AND APPENDECTOMY  . Left elbow surgery    . Lumbar disc surgery  1993   . Abdominal hysterectomy  1981 (APPROX)    BILATERAL SALPINGO-OOPHORECTOMY WITH EXCEPTION A SMALL PIECE OF OVARY REMAINS  . Transurethral resection of bladder tumor N/A 06/24/2012    Procedure: TRANSURETHRAL RESECTION OF BLADDER TUMOR  WITH GYRUS AND INSTILLATION OF MITOMYCIN C  (TURBT);  Surgeon: Mark C Ottelin, MD;  Location: [facility];  Service: Urology;  Laterality: N/A;  . Appendectomy    . Wrist surgery      History   Social History  . Marital Status: Widowed    Spouse Name: N/A    Number of Children: 2  . Years of Education: 16   Occupational History  . RN, BSN    Social History Main Topics  . Smoking status: Current Every Day Smoker -- 0.20 packs/day for 50 years    Types: Cigarettes  . Smokeless tobacco: Never Used     Comment: PT HAS BEEN QUITTING--  SMOKES 4 CIG'S PER DAY,  DOWN FROM 1PPD  . Alcohol Use: No     Comment: occasional wine not often  . Drug Use: No  . Sexual Activity: Not on file   Other Topics Concern  . Not on file   Social History Narrative  . No narrative on file    Family History  Problem Relation Age of Onset  . Cancer Other     colon cancer  . Hypertension Other   . Kidney disease Other   . Diabetes Other   . Heart disease   Mother     MI at age 70  . Cervical cancer Mother   . Cancer Mother   . Diabetes Mother   . Hypertension Mother   . Lung cancer Father 54    primary site lung CA then colon and bone  . Colon cancer Father   . Cancer Father   . Hypertension Father     Allergies as of 10/18/2012  . (No Known Allergies)    Current Outpatient Prescriptions on File Prior to Visit  Medication Sig Dispense Refill  . aspirin 81 MG tablet Take 81 mg by mouth daily.      . carvedilol (COREG) 3.125 MG tablet Take 1 tablet (3.125 mg total) by mouth 2 (two) times daily.  60 tablet  12  . furosemide (LASIX) 20 MG tablet Take 2 tablets (40 mg total) by mouth daily.  30 tablet  3  . metFORMIN (GLUCOPHAGE) 500 MG tablet  Take 1 tablet by mouth as needed.      . Multiple Vitamin (MULTIVITAMIN) tablet Take 1 tablet by mouth daily.      . Omega-3 Fatty Acids (FISH OIL) 1000 MG CAPS Take 1 each by mouth daily.      . potassium chloride (K-DUR) 10 MEQ tablet Take 2 tablets (20 mEq total) by mouth 2 (two) times daily.  30 tablet  3  . pravastatin (PRAVACHOL) 40 MG tablet Take 1 tablet (40 mg total) by mouth every evening.  30 tablet  11   No current facility-administered medications on file prior to visit.     REVIEW OF SYSTEMS:  Positives indicated with an "X"  CARDIOVASCULAR:  [ ] chest pain   [ ] chest pressure   [ ] palpitations   [ ] orthopnea   [ ] dyspnea on exertion   [ ] claudication   [ ] rest pain   [ ] DVT   [ ] phlebitis PULMONARY:   [ ] productive cough   [ ] asthma   [ ] wheezing NEUROLOGIC:   [ ] weakness  [ ] paresthesias  [ ] aphasia  [ ] amaurosis  [ ] dizziness HEMATOLOGIC:   [ ] bleeding problems   [ ] clotting disorders MUSCULOSKELETAL:  [ ] joint pain   [ ] joint swelling GASTROINTESTINAL: [ ]  blood in stool  [ ]  hematemesis GENITOURINARY:  [ ]  dysuria  [x ]  hematuria PSYCHIATRIC:  [ ] history of major depression INTEGUMENTARY:  [ ] rashes  [ ] ulcers CONSTITUTIONAL:  [ ] fever   [ ] chills  PHYSICAL EXAMINATION:  General: The patient is a well-nourished female, in no acute distress. Vital signs are BP 153/53  Pulse 72  Ht 5' 2" (1.575 m)  Wt 97 lb 6.4 oz (44.18 kg)  BMI 17.81 kg/m2  SpO2 100% Pulmonary: There is a good air exchange bilaterally without wheezing or rales. Abdomen: Soft and non-tender with normal pitch bowel sounds. No aneurysm is palpable Musculoskeletal: There are no major deformities.  There is no significant extremity pain. Neurologic: No focal weakness or paresthesias are detected, Skin: There are no ulcer or rashes noted. Psychiatric: The patient has normal affect. Cardiovascular: There is a regular rate and rhythm without significant murmur  appreciated. Carotid arteries without bruits bilaterally 2+ radial and 2+ femoral pulses. 1+ dorsalis pedis pulses bilaterally  Vascular Lab Studies: Severe carotid disease with velocities of 620/220 diastolic on the right internal carotid artery. Impression and Plan:  Severe right   and moderate to severe left carotid stenosis in the setting of coronary artery disease including coronary bypass grafting. I discussed this at length with the patient and her family present. I have recommended combined right carotid endarterectomy and coronary bypass grafting with Dr. Bartle. I explained the procedure and potential risk to include stroke with surgery at about 1-1-1/2% and also the very slight risk of cranial nerve injury. She understands and wishes to proceed with one-week coordinate this    Arie Powell [facility] Office: [phone]         

## 2012-11-01 ENCOUNTER — Encounter (HOSPITAL_COMMUNITY): Payer: Self-pay

## 2012-11-07 ENCOUNTER — Other Ambulatory Visit: Payer: Self-pay | Admitting: *Deleted

## 2012-11-07 ENCOUNTER — Other Ambulatory Visit: Payer: Self-pay | Admitting: Internal Medicine

## 2012-11-07 MED ORDER — GLUCOSE BLOOD VI STRP: ORAL_STRIP | Status: DC

## 2012-11-07 NOTE — Telephone Encounter (Signed)
A user error has taken place open by mistake

## 2012-11-07 NOTE — Telephone Encounter (Signed)
Received fax stating need dx code on bs strips for insurance purpose...lmb

## 2012-11-08 ENCOUNTER — Other Ambulatory Visit: Payer: Self-pay | Admitting: Internal Medicine

## 2012-11-09 ENCOUNTER — Encounter (HOSPITAL_COMMUNITY): Payer: Self-pay

## 2012-11-09 ENCOUNTER — Ambulatory Visit (HOSPITAL_COMMUNITY)
Admission: RE | Admit: 2012-11-09 | Discharge: 2012-11-09 | Disposition: A | Discharge status: Home / Self Care | Payer: Medicare Other | Source: Ambulatory Visit | Attending: Surgery | Admitting: Surgery

## 2012-11-09 ENCOUNTER — Encounter: Payer: Self-pay | Admitting: Surgery

## 2012-11-09 ENCOUNTER — Encounter (HOSPITAL_COMMUNITY)
Admission: RE | Admit: 2012-11-09 | Discharge: 2012-11-09 | Disposition: A | Discharge status: Home / Self Care | Payer: Medicare Other | Source: Ambulatory Visit | Attending: Surgery | Admitting: Surgery

## 2012-11-09 ENCOUNTER — Ambulatory Visit (INDEPENDENT_AMBULATORY_CARE_PROVIDER_SITE_OTHER): Payer: Medicare Other | Admitting: Surgery

## 2012-11-09 VITALS — BP 158/68 | HR 62 | Temp 97.9°F | Resp 20 | Ht 62.0 in | Wt 96.3 lb

## 2012-11-09 VITALS — BP 125/63 | HR 72 | Resp 20 | Ht 62.0 in | Wt 95.0 lb

## 2012-11-09 DIAGNOSIS — I2510 Atherosclerotic heart disease of native coronary artery without angina pectoris: Secondary | ICD-10-CM

## 2012-11-09 DIAGNOSIS — Z01812 Encounter for preprocedural laboratory examination: Secondary | ICD-10-CM | POA: Insufficient documentation

## 2012-11-09 DIAGNOSIS — J449 Chronic obstructive pulmonary disease, unspecified: Secondary | ICD-10-CM | POA: Insufficient documentation

## 2012-11-09 DIAGNOSIS — Z01810 Encounter for preprocedural cardiovascular examination: Secondary | ICD-10-CM | POA: Insufficient documentation

## 2012-11-09 DIAGNOSIS — E119 Type 2 diabetes mellitus without complications: Secondary | ICD-10-CM | POA: Insufficient documentation

## 2012-11-09 DIAGNOSIS — J4489 Other specified chronic obstructive pulmonary disease: Secondary | ICD-10-CM | POA: Insufficient documentation

## 2012-11-09 DIAGNOSIS — Z01811 Encounter for preprocedural respiratory examination: Secondary | ICD-10-CM | POA: Insufficient documentation

## 2012-11-09 HISTORY — DX: Personal history of other diseases of the digestive system: Z87.19

## 2012-11-09 LAB — APTT: aPTT: 27 seconds (ref 24–37)

## 2012-11-09 LAB — BLOOD GAS, ARTERIAL
Acid-Base Excess: 1.3 mmol/L (ref 0.0–2.0)
Drawn by: 181601
O2 Saturation: 96.3 %
Patient temperature: 98.6
TCO2: 26.7 mmol/L (ref 0–100)

## 2012-11-09 LAB — URINE MICROSCOPIC-ADD ON

## 2012-11-09 LAB — URINALYSIS, ROUTINE W REFLEX MICROSCOPIC
Bilirubin Urine: NEGATIVE
Glucose, UA: NEGATIVE mg/dL
Ketones, ur: NEGATIVE mg/dL
pH: 5 (ref 5.0–8.0)

## 2012-11-09 LAB — HEMOGLOBIN A1C
Hgb A1c MFr Bld: 7.7 % — ABNORMAL HIGH (ref <5.7)
Mean Plasma Glucose: 174 mg/dL — ABNORMAL HIGH (ref <117)

## 2012-11-09 LAB — PROTIME-INR: Prothrombin Time: 12.8 seconds (ref 11.6–15.2)

## 2012-11-09 LAB — COMPREHENSIVE METABOLIC PANEL
Albumin: 3.8 g/dL (ref 3.5–5.2)
BUN: 15 mg/dL (ref 6–23)
Calcium: 10.1 mg/dL (ref 8.4–10.5)
Chloride: 96 mEq/L (ref 96–112)
Creatinine, Ser: 0.64 mg/dL (ref 0.50–1.10)
Total Bilirubin: 0.4 mg/dL (ref 0.3–1.2)

## 2012-11-09 LAB — CBC
HCT: 37.9 % (ref 36.0–46.0)
MCH: 31.3 pg (ref 26.0–34.0)
MCHC: 36.1 g/dL — ABNORMAL HIGH (ref 30.0–36.0)
MCV: 86.5 fL (ref 78.0–100.0)
Platelets: 255 10*3/uL (ref 150–400)
RDW: 14.2 % (ref 11.5–15.5)

## 2012-11-09 LAB — SURGICAL PCR SCREEN: MRSA, PCR: NEGATIVE

## 2012-11-09 LAB — PULMONARY FUNCTION TEST

## 2012-11-09 MED ORDER — ALBUTEROL SULFATE (5 MG/ML) 0.5% IN NEBU
2.5000 mg | INHALATION_SOLUTION | Freq: Once | RESPIRATORY_TRACT | Status: AC
  Administered 2012-11-09: 2.5 mg via RESPIRATORY_TRACT

## 2012-11-09 NOTE — Progress Notes (Signed)
      [address]       [name] [address]             [phone]        HPI:  The patient returns today to review the operative plans for her combined right carotid endarterectomy by Dr. Arbie Cookey and CABG by me scheduled for 11/14/2012. She says she continues to do well and denies any symptoms. Her PFT's show a mild reversible obstructive defect with a severe diffusion defect, although her room air ABG looks ok. She denies any dyspnea.  Current Outpatient Prescriptions  Medication Sig Dispense Refill  . ACCU-CHEK FASTCLIX LANCETS MISC USE TO CHECK BLOOD SUGAR FOUR TIMES DAILY  102 each  11  . ACCU-CHEK SMARTVIEW test strip USE TO CHECK BLOOD SUGAR FOUR TIMES DAILY AS DIRECTED  100 each  11  . aspirin 81 MG tablet Take 81 mg by mouth daily.      . carvedilol (COREG) 3.125 MG tablet Take 1 tablet (3.125 mg total) by mouth 2 (two) times daily.  60 tablet  12  . furosemide (LASIX) 20 MG tablet Take 2 tablets (40 mg total) by mouth daily.  30 tablet  3  . metFORMIN (GLUCOPHAGE) 500 MG tablet Take 1 tablet by mouth 2 (two) times daily with a meal.       . Multiple Vitamin (MULTIVITAMIN) tablet Take 1 tablet by mouth daily.      . Omega-3 Fatty Acids (FISH OIL) 1000 MG CAPS Take 1 each by mouth daily.      . potassium chloride (K-DUR) 10 MEQ tablet Take 2 tablets (20 mEq total) by mouth 2 (two) times daily.  30 tablet  3  . pravastatin (PRAVACHOL) 40 MG tablet Take 1 tablet (40 mg total) by mouth every evening.  30 tablet  11   No current facility-administered medications for this visit.     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  Plan:  Combined right CEA and CABG on Monday next week with Dr. Arbie Cookey.  I discussed the operative procedure with the patient including alternatives, benefits and risks; including but not limited to bleeding, blood transfusion, infection, stroke, myocardial infarction, graft failure, heart block requiring a permanent pacemaker, organ dysfunction, and death.  Tonya Valdez [facility] understands and agrees to proceed.

## 2012-11-09 NOTE — Pre-Procedure Instructions (Addendum)
Tonya Valdez [facility]  11/09/2012   Your procedure is scheduled on:  9.22.14  Report to [facility] at 530 AM.  Call this number if you have problems the morning of surgery: [phone]   Remember:   Do not eat food or drink liquids after midnight.   Take these medicines the morning of surgery with A SIP OF WATER: carvediolol          STOP omega 3 now   Do not wear jewelry, make-up or nail polish.  Do not wear lotions, powders, or perfumes. You may wear deodorant.  Do not shave 48 hours prior to surgery. Men may shave face and neck.  Do not bring valuables to the hospital.  [facility] is not responsible                   for any belongings or valuables.  Contacts, dentures or bridgework may not be worn into surgery.  Leave suitcase in the car. After surgery it may be brought to your room.  For patients admitted to the hospital, checkout time is 11:00 AM the day of  discharge.   Patients discharged the day of surgery will not be allowed to drive  home.  Name and phone number of your driver:   Special Instructions: Incentive Spirometry - Practice and bring it with you on the day of surgery. Shower using CHG 2 nights before surgery and the night before surgery.  If you shower the day of surgery use CHG.  Use special wash - you have one bottle of CHG for all showers.  You should use approximately 1/3 of the bottle for each shower.   Please read over the following fact sheets that you were given: Pain Booklet, Coughing and Deep Breathing, Blood Transfusion Information, Open Heart Packet, MRSA Information and Surgical Site Infection Prevention

## 2012-11-09 NOTE — Progress Notes (Addendum)
Patient refused wear bracelet for type and screen. Draw dos  Wbc count called to ryan at office also ? uti

## 2012-11-09 NOTE — Progress Notes (Signed)
VASCULAR LAB PRELIMINARY  PRELIMINARY  PRELIMINARY  PRELIMINARY  Pre-op Cardiac Surgery  Carotid Findings:    Upper Extremity Right Left  Brachial Pressures 159 biphasic 162 biphasic  Radial Waveforms biphasic biphasic  Ulnar Waveforms monophasic biphasic  Palmar Arch (Allen's Test) * **   Findings:  *Right:  Doppler waveforms remain normal with ulnar and radial compressions.  **Left:  Doppler waveforms decrease 50% with ulnar and remain normal with radial compressions.    Lower  Extremity Right Left  Dorsalis Pedis    Anterior Tibial 153 Severely dampened monophasic 140 severely dampened monophasic  Posterior Tibial 152 biphasic 151 biphasic  Ankle/Brachial Indices      Findings:  ABI is within normal limits with abnormal Doppler waveforms bilaterally.   Tonya Valdez, RVT 11/09/2012, 11:13 AM

## 2012-11-10 ENCOUNTER — Ambulatory Visit (INDEPENDENT_AMBULATORY_CARE_PROVIDER_SITE_OTHER): Payer: Medicare Other | Admitting: Cardiology

## 2012-11-10 ENCOUNTER — Other Ambulatory Visit (INDEPENDENT_AMBULATORY_CARE_PROVIDER_SITE_OTHER): Payer: Medicare Other

## 2012-11-10 ENCOUNTER — Encounter: Payer: Self-pay | Admitting: Cardiology

## 2012-11-10 VITALS — BP 142/62 | HR 66 | Ht 62.0 in | Wt 98.0 lb

## 2012-11-10 DIAGNOSIS — I739 Peripheral vascular disease, unspecified: Secondary | ICD-10-CM

## 2012-11-10 DIAGNOSIS — I255 Ischemic cardiomyopathy: Secondary | ICD-10-CM | POA: Insufficient documentation

## 2012-11-10 DIAGNOSIS — I509 Heart failure, unspecified: Secondary | ICD-10-CM

## 2012-11-10 DIAGNOSIS — I2589 Other forms of chronic ischemic heart disease: Secondary | ICD-10-CM

## 2012-11-10 DIAGNOSIS — I779 Disorder of arteries and arterioles, unspecified: Secondary | ICD-10-CM

## 2012-11-10 DIAGNOSIS — I2510 Atherosclerotic heart disease of native coronary artery without angina pectoris: Secondary | ICD-10-CM | POA: Insufficient documentation

## 2012-11-10 DIAGNOSIS — F17200 Nicotine dependence, unspecified, uncomplicated: Secondary | ICD-10-CM

## 2012-11-10 LAB — LIPID PANEL
Total CHOL/HDL Ratio: 2
Triglycerides: 84 mg/dL (ref 0.0–149.0)

## 2012-11-10 LAB — HEPATIC FUNCTION PANEL
AST: 30 U/L (ref 0–37)
Alkaline Phosphatase: 86 U/L (ref 39–117)
Total Bilirubin: 0.4 mg/dL (ref 0.3–1.2)

## 2012-11-10 MED ORDER — LISINOPRIL 2.5 MG PO TABS: 2.5000 mg | ORAL_TABLET | Freq: Every day | ORAL | Status: DC

## 2012-11-10 MED ORDER — CARVEDILOL 6.25 MG PO TABS: 6.2500 mg | ORAL_TABLET | Freq: Two times a day (BID) | ORAL | Status: DC

## 2012-11-10 NOTE — Assessment & Plan Note (Signed)
Symptoms improved. Continue present dose of Lasix.

## 2012-11-10 NOTE — Assessment & Plan Note (Signed)
Continue aspirin and statin. Scheduled for right carotid endarterectomy next week.

## 2012-11-10 NOTE — Assessment & Plan Note (Signed)
Patient counseled on discontinuing. 

## 2012-11-10 NOTE — Progress Notes (Signed)
HPI: fu CAD and CHF. Abdominal CT in April of 2014 showed a filling defect in the bladder, bilateral pleural effusions and a right lower lobe nodule. Followup recommended. Echocardiogram in June of 2014 showed an ejection fraction of 30% and severe left ventricular enlargement. There was mild biatrial enlargement and mild mitral regurgitation. Abdominal ultrasound in August of 2014 showed moderate stenosis of the mid to distal aorta and severe bilateral common iliac disease. Carotid Dopplers in August of 2014 showed 80-99% right and 60-79% left stenosis. Cardiac catheterization in August of 2014 showed a pulmonary capillary wedge pressure of 30. There was a 60% ostial left main and 40-50% distal. There was a 40-50% ostial LAD and 70-80% mid. There was severe disease at the apex of the LAD. There was an 80-90% mid circumflex. The right coronary was totally occluded. Ejection fraction 25-30%. There was a 95% right iliac and a 60-70% left renal.patient has been scheduled for coronary artery bypassing graft and right carotid endarterectomy on 11/14/2012. Since I last saw her, the patient denies any dyspnea on exertion, orthopnea, PND, pedal edema, palpitations, syncope or chest pain.    Current Outpatient Prescriptions  Medication Sig Dispense Refill  . ACCU-CHEK FASTCLIX LANCETS MISC USE TO CHECK BLOOD SUGAR FOUR TIMES DAILY  102 each  11  . ACCU-CHEK SMARTVIEW test strip USE TO CHECK BLOOD SUGAR FOUR TIMES DAILY AS DIRECTED  100 each  11  . aspirin 81 MG tablet Take 81 mg by mouth daily.      . carvedilol (COREG) 3.125 MG tablet Take 1 tablet (3.125 mg total) by mouth 2 (two) times daily.  60 tablet  12  . furosemide (LASIX) 20 MG tablet Take 2 tablets (40 mg total) by mouth daily.  30 tablet  3  . metFORMIN (GLUCOPHAGE) 500 MG tablet Take 1 tablet by mouth 2 (two) times daily with a meal.       . Multiple Vitamin (MULTIVITAMIN) tablet Take 1 tablet by mouth daily.      . Omega-3 Fatty Acids (FISH  OIL) 1000 MG CAPS Take 1 each by mouth daily.      . potassium chloride (K-DUR) 10 MEQ tablet Take 2 tablets (20 mEq total) by mouth 2 (two) times daily.  30 tablet  3  . pravastatin (PRAVACHOL) 40 MG tablet Take 1 tablet (40 mg total) by mouth every evening.  30 tablet  11   No current facility-administered medications for this visit.     Past Medical History  Diagnosis Date  . Positive TB test   . Bladder tumor   . COPD (chronic obstructive pulmonary disease)   . Diabetes mellitus TYPE II--  PER PCP NOTE DR Oliver Barre    PER PT DOES NOT TAKES METFORMIN PRESCRIBED, SHE WATCHES DIET AND EXERCISES  . Congestive heart failure, unspecified 08/04/2012  . Bilateral carotid artery disease 09/26/2012  . CAD (coronary artery disease)   . Cancer     bladder  . H/O hiatal hernia     s/p  72 yrs old    Past Surgical History  Procedure Laterality Date  . Tonsillectomy  1959  . Hiatal hernia repair  1964  (AGE 7)    CHOLECYSTECTOMY AND APPENDECTOMY  . Left elbow surgery    . Lumbar disc surgery  1993  . Abdominal hysterectomy  1981 (APPROX)    BILATERAL SALPINGO-OOPHORECTOMY WITH EXCEPTION A SMALL PIECE OF OVARY REMAINS  . Transurethral resection of bladder tumor N/A 06/24/2012    Procedure:  TRANSURETHRAL RESECTION OF BLADDER TUMOR  WITH GYRUS AND INSTILLATION OF MITOMYCIN C  (TURBT);  Surgeon: Garnett Farm, MD;  Location: [facility];  Service: Urology;  Laterality: N/A;  . Appendectomy    . Wrist surgery Left   . Cholecystectomy    . Tonsillectomy    . Cardiac catheterization    . Hammer toe surgery Left     History   Social History  . Marital Status: Widowed    Spouse Name: N/A    Number of Children: 2  . Years of Education: 16   Occupational History  . RN, BSN    Social History Main Topics  . Smoking status: Current Every Day Smoker -- 0.20 packs/day for 50 years    Types: Cigarettes  . Smokeless tobacco: Never Used     Comment: PT HAS BEEN QUITTING--   SMOKES 4 CIG'S PER DAY,  DOWN FROM 1PPD  . Alcohol Use: No     Comment: occasional wine not often  . Drug Use: No  . Sexual Activity: Not on file   Other Topics Concern  . Not on file   Social History Narrative  . No narrative on file    ROS: no fevers or chills, productive cough, hemoptysis, dysphasia, odynophagia, melena, hematochezia, dysuria, hematuria, rash, seizure activity, orthopnea, PND, pedal edema, claudication. Remaining systems are negative.  Physical Exam: Well-developed well-nourished in no acute distress.  Skin is warm and dry.  HEENT is normal.  Neck is supple.  Chest is clear to auscultation with normal expansion.  Cardiovascular exam is regular rate and rhythm.  Abdominal exam nontender or distended. No masses palpated. Extremities show no edema. neuro grossly intact

## 2012-11-10 NOTE — Assessment & Plan Note (Signed)
Continue aspirin and statin. 

## 2012-11-10 NOTE — Patient Instructions (Addendum)
Your physician recommends that you schedule a follow-up appointment in: TBD  INCREASE CARVEDILOL 6.25 MG ONE TABLET TWICE DAILY  START LISINOPRIL 2.5 MG ONCE DAILY

## 2012-11-10 NOTE — Assessment & Plan Note (Signed)
Continue aspirin and statin. For coronary artery bypassing graft next week.

## 2012-11-10 NOTE — Assessment & Plan Note (Signed)
Increase carvedilol to 6.25 mg by mouth twice a day. Add lisinopril 2.5 mg daily. She will need a followup echocardiogram 3 months following her surgery to see if LV function has improved. Otherwise we would need to consider ICD.

## 2012-11-13 MED ORDER — VANCOMYCIN HCL 1000 MG IV SOLR
1000.0000 mg | INTRAVENOUS | Status: AC
  Administered 2012-11-14: 1000 mg via INTRAVENOUS
  Filled 2012-11-13: qty 1000

## 2012-11-13 MED ORDER — NITROGLYCERIN IN D5W 200-5 MCG/ML-% IV SOLN
2.0000 ug/min | INTRAVENOUS | Status: DC
  Filled 2012-11-13: qty 250

## 2012-11-13 MED ORDER — SODIUM CHLORIDE 0.9 % IV SOLN
INTRAVENOUS | Status: AC
  Administered 2012-11-14: 1 [IU]/h via INTRAVENOUS
  Filled 2012-11-13: qty 1

## 2012-11-13 MED ORDER — PLASMA-LYTE 148 IV SOLN
INTRAVENOUS | Status: AC
  Administered 2012-11-14: 09:00:00
  Filled 2012-11-13: qty 2.5

## 2012-11-13 MED ORDER — PHENYLEPHRINE HCL 10 MG/ML IJ SOLN
30.0000 ug/min | INTRAVENOUS | Status: DC
  Filled 2012-11-13: qty 2

## 2012-11-13 MED ORDER — DEXMEDETOMIDINE HCL IN NACL 400 MCG/100ML IV SOLN
0.1000 ug/kg/h | INTRAVENOUS | Status: AC
  Administered 2012-11-14: 0.3 ug/kg/h via INTRAVENOUS
  Filled 2012-11-13: qty 100

## 2012-11-13 MED ORDER — DOPAMINE-DEXTROSE 3.2-5 MG/ML-% IV SOLN
2.0000 ug/kg/min | INTRAVENOUS | Status: DC
  Filled 2012-11-13: qty 250

## 2012-11-13 MED ORDER — SODIUM CHLORIDE 0.9 % IV SOLN
INTRAVENOUS | Status: DC
  Filled 2012-11-13: qty 30

## 2012-11-13 MED ORDER — EPINEPHRINE HCL 1 MG/ML IJ SOLN
0.5000 ug/min | INTRAVENOUS | Status: DC
  Filled 2012-11-13: qty 4

## 2012-11-13 MED ORDER — POTASSIUM CHLORIDE 2 MEQ/ML IV SOLN
80.0000 meq | INTRAVENOUS | Status: DC
  Filled 2012-11-13: qty 40

## 2012-11-13 MED ORDER — MAGNESIUM SULFATE 50 % IJ SOLN
40.0000 meq | INTRAMUSCULAR | Status: DC
  Filled 2012-11-13: qty 10

## 2012-11-13 MED ORDER — DEXTROSE 5 % IV SOLN
1.5000 g | INTRAVENOUS | Status: AC
  Administered 2012-11-14: 1.5 g via INTRAVENOUS
  Administered 2012-11-14: .75 g via INTRAVENOUS
  Filled 2012-11-13: qty 1.5

## 2012-11-13 MED ORDER — DEXTROSE 5 % IV SOLN
750.0000 mg | INTRAVENOUS | Status: DC
  Filled 2012-11-13: qty 750

## 2012-11-13 MED ORDER — SODIUM CHLORIDE 0.9 % IV SOLN
INTRAVENOUS | Status: DC
  Filled 2012-11-13: qty 40

## 2012-11-14 ENCOUNTER — Encounter (HOSPITAL_COMMUNITY): Payer: Self-pay | Admitting: *Deleted

## 2012-11-14 ENCOUNTER — Encounter (HOSPITAL_COMMUNITY)
Admission: RE | Disposition: A | Discharge status: Home / Self Care | Payer: Medicare Other | Source: Ambulatory Visit | Attending: Surgery

## 2012-11-14 ENCOUNTER — Inpatient Hospital Stay (HOSPITAL_COMMUNITY): Payer: Medicare Other

## 2012-11-14 ENCOUNTER — Encounter (HOSPITAL_COMMUNITY): Payer: Self-pay | Admitting: Anesthesiology

## 2012-11-14 ENCOUNTER — Ambulatory Visit (HOSPITAL_COMMUNITY): Payer: Medicare Other | Admitting: Anesthesiology

## 2012-11-14 ENCOUNTER — Encounter: Payer: Self-pay | Admitting: *Deleted

## 2012-11-14 ENCOUNTER — Inpatient Hospital Stay (HOSPITAL_COMMUNITY)
Admission: RE | Admit: 2012-11-14 | Discharge: 2012-11-19 | DRG: 236 | Disposition: A | Discharge status: Home / Self Care | Payer: Medicare Other | Source: Ambulatory Visit | Attending: Surgery | Admitting: Surgery

## 2012-11-14 DIAGNOSIS — I2510 Atherosclerotic heart disease of native coronary artery without angina pectoris: Secondary | ICD-10-CM

## 2012-11-14 DIAGNOSIS — I2589 Other forms of chronic ischemic heart disease: Secondary | ICD-10-CM | POA: Diagnosis present

## 2012-11-14 DIAGNOSIS — I255 Ischemic cardiomyopathy: Secondary | ICD-10-CM

## 2012-11-14 DIAGNOSIS — Z841 Family history of disorders of kidney and ureter: Secondary | ICD-10-CM

## 2012-11-14 DIAGNOSIS — J9819 Other pulmonary collapse: Secondary | ICD-10-CM | POA: Diagnosis not present

## 2012-11-14 DIAGNOSIS — Z7982 Long term (current) use of aspirin: Secondary | ICD-10-CM

## 2012-11-14 DIAGNOSIS — I739 Peripheral vascular disease, unspecified: Secondary | ICD-10-CM | POA: Diagnosis present

## 2012-11-14 DIAGNOSIS — Z9089 Acquired absence of other organs: Secondary | ICD-10-CM

## 2012-11-14 DIAGNOSIS — R911 Solitary pulmonary nodule: Secondary | ICD-10-CM | POA: Diagnosis present

## 2012-11-14 DIAGNOSIS — I6529 Occlusion and stenosis of unspecified carotid artery: Secondary | ICD-10-CM

## 2012-11-14 DIAGNOSIS — Z8249 Family history of ischemic heart disease and other diseases of the circulatory system: Secondary | ICD-10-CM

## 2012-11-14 DIAGNOSIS — Z833 Family history of diabetes mellitus: Secondary | ICD-10-CM

## 2012-11-14 DIAGNOSIS — F17200 Nicotine dependence, unspecified, uncomplicated: Secondary | ICD-10-CM | POA: Diagnosis present

## 2012-11-14 DIAGNOSIS — J988 Other specified respiratory disorders: Secondary | ICD-10-CM | POA: Diagnosis not present

## 2012-11-14 DIAGNOSIS — Z800 Family history of malignant neoplasm of digestive organs: Secondary | ICD-10-CM

## 2012-11-14 DIAGNOSIS — Z79899 Other long term (current) drug therapy: Secondary | ICD-10-CM

## 2012-11-14 DIAGNOSIS — E8779 Other fluid overload: Secondary | ICD-10-CM | POA: Diagnosis not present

## 2012-11-14 DIAGNOSIS — J449 Chronic obstructive pulmonary disease, unspecified: Secondary | ICD-10-CM | POA: Diagnosis present

## 2012-11-14 DIAGNOSIS — E785 Hyperlipidemia, unspecified: Secondary | ICD-10-CM | POA: Diagnosis present

## 2012-11-14 DIAGNOSIS — J4489 Other specified chronic obstructive pulmonary disease: Secondary | ICD-10-CM | POA: Diagnosis present

## 2012-11-14 DIAGNOSIS — I658 Occlusion and stenosis of other precerebral arteries: Secondary | ICD-10-CM | POA: Diagnosis present

## 2012-11-14 DIAGNOSIS — D62 Acute posthemorrhagic anemia: Secondary | ICD-10-CM | POA: Diagnosis not present

## 2012-11-14 DIAGNOSIS — Z8049 Family history of malignant neoplasm of other genital organs: Secondary | ICD-10-CM

## 2012-11-14 DIAGNOSIS — Z801 Family history of malignant neoplasm of trachea, bronchus and lung: Secondary | ICD-10-CM

## 2012-11-14 DIAGNOSIS — Z951 Presence of aortocoronary bypass graft: Secondary | ICD-10-CM

## 2012-11-14 DIAGNOSIS — E119 Type 2 diabetes mellitus without complications: Secondary | ICD-10-CM | POA: Diagnosis present

## 2012-11-14 DIAGNOSIS — I2789 Other specified pulmonary heart diseases: Secondary | ICD-10-CM | POA: Diagnosis present

## 2012-11-14 DIAGNOSIS — J309 Allergic rhinitis, unspecified: Secondary | ICD-10-CM

## 2012-11-14 HISTORY — PX: INTRAOPERATIVE TRANSESOPHAGEAL ECHOCARDIOGRAM: SHX5062

## 2012-11-14 HISTORY — PX: CORONARY ARTERY BYPASS GRAFT: SHX141

## 2012-11-14 HISTORY — PX: ENDARTERECTOMY: SHX5162

## 2012-11-14 LAB — CBC
Hemoglobin: 9.7 g/dL — ABNORMAL LOW (ref 12.0–15.0)
MCH: 30.6 pg (ref 26.0–34.0)
MCHC: 34.9 g/dL (ref 30.0–36.0)
MCV: 86.1 fL (ref 78.0–100.0)
Platelets: 130 10*3/uL — ABNORMAL LOW (ref 150–400)
RBC: 3.5 MIL/uL — ABNORMAL LOW (ref 3.87–5.11)
RBC: 3.17 MIL/uL — ABNORMAL LOW (ref 3.87–5.11)
RDW: 14.6 % (ref 11.5–15.5)
WBC: 16.4 10*3/uL — ABNORMAL HIGH (ref 4.0–10.5)

## 2012-11-14 LAB — POCT I-STAT 4, (NA,K, GLUC, HGB,HCT)
Glucose, Bld: 86 mg/dL (ref 70–99)
Glucose, Bld: 89 mg/dL (ref 70–99)
Glucose, Bld: 132 mg/dL — ABNORMAL HIGH (ref 70–99)
Glucose, Bld: 66 mg/dL — ABNORMAL LOW (ref 70–99)
HCT: 31 % — ABNORMAL LOW (ref 36.0–46.0)
HCT: 28 % — ABNORMAL LOW (ref 36.0–46.0)
HCT: 22 % — ABNORMAL LOW (ref 36.0–46.0)
HCT: 21 % — ABNORMAL LOW (ref 36.0–46.0)
Hemoglobin: 10.5 g/dL — ABNORMAL LOW (ref 12.0–15.0)
Hemoglobin: 9.5 g/dL — ABNORMAL LOW (ref 12.0–15.0)
Hemoglobin: 6.8 g/dL — CL (ref 12.0–15.0)
Hemoglobin: 7.5 g/dL — ABNORMAL LOW (ref 12.0–15.0)
Hemoglobin: 7.1 g/dL — ABNORMAL LOW (ref 12.0–15.0)
Hemoglobin: 8.8 g/dL — ABNORMAL LOW (ref 12.0–15.0)
Potassium: 4.2 mEq/L (ref 3.5–5.1)
Potassium: 4.9 mEq/L (ref 3.5–5.1)
Potassium: 5 mEq/L (ref 3.5–5.1)
Sodium: 138 mEq/L (ref 135–145)
Sodium: 138 mEq/L (ref 135–145)
Sodium: 131 mEq/L — ABNORMAL LOW (ref 135–145)
Sodium: 133 mEq/L — ABNORMAL LOW (ref 135–145)
Sodium: 134 mEq/L — ABNORMAL LOW (ref 135–145)
Sodium: 138 mEq/L (ref 135–145)

## 2012-11-14 LAB — POCT I-STAT 3, ART BLOOD GAS (G3+)
Acid-base deficit: 3 mmol/L — ABNORMAL HIGH (ref 0.0–2.0)
Acid-base deficit: 4 mmol/L — ABNORMAL HIGH (ref 0.0–2.0)
Bicarbonate: 24.6 mEq/L — ABNORMAL HIGH (ref 20.0–24.0)
Bicarbonate: 22.4 mEq/L (ref 20.0–24.0)
O2 Saturation: 100 %
TCO2: 25 mmol/L (ref 0–100)
TCO2: 24 mmol/L (ref 0–100)
TCO2: 24 mmol/L (ref 0–100)
pCO2 arterial: 40.6 mmHg (ref 35.0–45.0)
pCO2 arterial: 47.5 mmHg — ABNORMAL HIGH (ref 35.0–45.0)
pCO2 arterial: 44.8 mmHg (ref 35.0–45.0)
pCO2 arterial: 48.6 mmHg — ABNORMAL HIGH (ref 35.0–45.0)
pH, Arterial: 7.39 (ref 7.350–7.450)
pH, Arterial: 7.303 — ABNORMAL LOW (ref 7.350–7.450)
pH, Arterial: 7.305 — ABNORMAL LOW (ref 7.350–7.450)
pH, Arterial: 7.275 — ABNORMAL LOW (ref 7.350–7.450)
pO2, Arterial: 239 mmHg — ABNORMAL HIGH (ref 80.0–100.0)
pO2, Arterial: 185 mmHg — ABNORMAL HIGH (ref 80.0–100.0)

## 2012-11-14 LAB — POCT I-STAT, CHEM 8
BUN: 14 mg/dL (ref 6–23)
Calcium, Ion: 1.2 mmol/L (ref 1.13–1.30)
Chloride: 103 mEq/L (ref 96–112)
Potassium: 4.5 mEq/L (ref 3.5–5.1)
Sodium: 137 mEq/L (ref 135–145)

## 2012-11-14 LAB — GLUCOSE, CAPILLARY
Glucose-Capillary: 127 mg/dL — ABNORMAL HIGH (ref 70–99)
Glucose-Capillary: 61 mg/dL — ABNORMAL LOW (ref 70–99)
Glucose-Capillary: 102 mg/dL — ABNORMAL HIGH (ref 70–99)

## 2012-11-14 LAB — PROTIME-INR
INR: 1.47 (ref 0.00–1.49)
Prothrombin Time: 17.4 seconds — ABNORMAL HIGH (ref 11.6–15.2)

## 2012-11-14 LAB — CREATININE, SERUM: GFR calc Af Amer: >90 mL/min (ref >90)

## 2012-11-14 LAB — APTT: aPTT: 36 seconds (ref 24–37)

## 2012-11-14 LAB — PLATELET COUNT: Platelets: 129 10*3/uL — ABNORMAL LOW (ref 150–400)

## 2012-11-14 LAB — POCT ACTIVATED CLOTTING TIME: Activated Clotting Time: 140 seconds

## 2012-11-14 LAB — PREPARE RBC (CROSSMATCH)

## 2012-11-14 LAB — HEMOGLOBIN AND HEMATOCRIT, BLOOD: HCT: 18.6 % — ABNORMAL LOW (ref 36.0–46.0)

## 2012-11-14 SURGERY — CORONARY ARTERY BYPASS GRAFTING (CABG)
Anesthesia: General | Site: Chest | Laterality: Right | Wound class: Clean

## 2012-11-14 MED ORDER — SODIUM CHLORIDE 0.45 % IV SOLN: INTRAVENOUS | Status: DC

## 2012-11-14 MED ORDER — PROTAMINE SULFATE 10 MG/ML IV SOLN
INTRAVENOUS | Status: DC | PRN
  Administered 2012-11-14: 20 mg via INTRAVENOUS
  Administered 2012-11-14: 50 mg via INTRAVENOUS
  Administered 2012-11-14: 30 mg via INTRAVENOUS
  Administered 2012-11-14 (×2): 50 mg via INTRAVENOUS

## 2012-11-14 MED ORDER — PHENYLEPHRINE HCL 10 MG/ML IJ SOLN
10.0000 mg | INTRAVENOUS | Status: DC | PRN
  Administered 2012-11-14: 10 ug/min via INTRAVENOUS

## 2012-11-14 MED ORDER — BISACODYL 5 MG PO TBEC
10.0000 mg | DELAYED_RELEASE_TABLET | Freq: Every day | ORAL | Status: DC
  Administered 2012-11-15: 10 mg via ORAL
  Filled 2012-11-14: qty 2

## 2012-11-14 MED ORDER — INSULIN REGULAR BOLUS VIA INFUSION
0.0000 [IU] | Freq: Three times a day (TID) | INTRAVENOUS | Status: DC
  Filled 2012-11-14: qty 10

## 2012-11-14 MED ORDER — VANCOMYCIN HCL IN DEXTROSE 1-5 GM/200ML-% IV SOLN
1000.0000 mg | Freq: Once | INTRAVENOUS | Status: AC
  Administered 2012-11-14: 1000 mg via INTRAVENOUS
  Filled 2012-11-14: qty 200

## 2012-11-14 MED ORDER — DEXTROSE 50 % IV SOLN
INTRAVENOUS | Status: AC
  Administered 2012-11-14: 14 mL
  Filled 2012-11-14: qty 50

## 2012-11-14 MED ORDER — ASPIRIN 81 MG PO CHEW: 324.0000 mg | CHEWABLE_TABLET | Freq: Every day | ORAL | Status: DC

## 2012-11-14 MED ORDER — POTASSIUM CHLORIDE 10 MEQ/50ML IV SOLN: 10.0000 meq | INTRAVENOUS | Status: AC

## 2012-11-14 MED ORDER — PHENYLEPHRINE HCL 10 MG/ML IJ SOLN
INTRAMUSCULAR | Status: DC | PRN
  Administered 2012-11-14: 80 ug via INTRAVENOUS
  Administered 2012-11-14: 40 ug via INTRAVENOUS
  Administered 2012-11-14: 80 ug via INTRAVENOUS
  Administered 2012-11-14: 40 ug via INTRAVENOUS
  Administered 2012-11-14: 80 ug via INTRAVENOUS
  Administered 2012-11-14: 40 ug via INTRAVENOUS

## 2012-11-14 MED ORDER — ACETAMINOPHEN 160 MG/5ML PO SOLN
1000.0000 mg | Freq: Four times a day (QID) | ORAL | Status: DC
  Filled 2012-11-14: qty 40

## 2012-11-14 MED ORDER — SODIUM CHLORIDE 0.9 % IV SOLN: INTRAVENOUS | Status: DC

## 2012-11-14 MED ORDER — SODIUM CHLORIDE 0.9 % IV SOLN
INTRAVENOUS | Status: DC
  Filled 2012-11-14 (×2): qty 1

## 2012-11-14 MED ORDER — THROMBIN 20000 UNITS EX SOLR
OROMUCOSAL | Status: DC | PRN
  Administered 2012-11-14: 09:00:00 via TOPICAL

## 2012-11-14 MED ORDER — METOPROLOL TARTRATE 12.5 MG HALF TABLET: 12.5000 mg | ORAL_TABLET | Freq: Once | ORAL | Status: DC

## 2012-11-14 MED ORDER — OXYCODONE HCL 5 MG PO TABS
5.0000 mg | ORAL_TABLET | ORAL | Status: DC | PRN
  Administered 2012-11-16: 5 mg via ORAL
  Filled 2012-11-14 (×2): qty 1
  Filled 2012-11-14: qty 2

## 2012-11-14 MED ORDER — SODIUM CHLORIDE 0.9 % IJ SOLN
3.0000 mL | Freq: Two times a day (BID) | INTRAMUSCULAR | Status: DC
  Administered 2012-11-15 – 2012-11-16 (×3): 3 mL via INTRAVENOUS

## 2012-11-14 MED ORDER — MIDAZOLAM HCL 5 MG/5ML IJ SOLN
INTRAMUSCULAR | Status: DC | PRN
  Administered 2012-11-14 (×5): 1 mg via INTRAVENOUS
  Administered 2012-11-14: 3 mg via INTRAVENOUS
  Administered 2012-11-14: 2 mg via INTRAVENOUS

## 2012-11-14 MED ORDER — SODIUM CHLORIDE 0.9 % IV SOLN
10.0000 g | INTRAVENOUS | Status: DC | PRN
  Administered 2012-11-14: 5 g/h via INTRAVENOUS

## 2012-11-14 MED ORDER — BISACODYL 10 MG RE SUPP: 10.0000 mg | Freq: Every day | RECTAL | Status: DC

## 2012-11-14 MED ORDER — HEMOSTATIC AGENTS (NO CHARGE) OPTIME
TOPICAL | Status: DC | PRN
  Administered 2012-11-14 (×2): 1 via TOPICAL

## 2012-11-14 MED ORDER — FAMOTIDINE IN NACL 20-0.9 MG/50ML-% IV SOLN
20.0000 mg | Freq: Two times a day (BID) | INTRAVENOUS | Status: AC
  Administered 2012-11-14: 20 mg via INTRAVENOUS

## 2012-11-14 MED ORDER — HYDROMORPHONE HCL PF 1 MG/ML IJ SOLN: 0.2500 mg | INTRAMUSCULAR | Status: DC | PRN

## 2012-11-14 MED ORDER — LACTATED RINGERS IV SOLN: 500.0000 mL | Freq: Once | INTRAVENOUS | Status: AC | PRN

## 2012-11-14 MED ORDER — LACTATED RINGERS IV SOLN
INTRAVENOUS | Status: DC | PRN
  Administered 2012-11-14 (×2): via INTRAVENOUS

## 2012-11-14 MED ORDER — 0.9 % SODIUM CHLORIDE (POUR BTL) OPTIME
TOPICAL | Status: DC | PRN
  Administered 2012-11-14: 1000 mL

## 2012-11-14 MED ORDER — ACETAMINOPHEN 160 MG/5ML PO SOLN: 650.0000 mg | Freq: Once | ORAL | Status: AC

## 2012-11-14 MED ORDER — MIDAZOLAM HCL 2 MG/2ML IJ SOLN: 2.0000 mg | INTRAMUSCULAR | Status: DC | PRN

## 2012-11-14 MED ORDER — ACETAMINOPHEN 500 MG PO TABS
1000.0000 mg | ORAL_TABLET | Freq: Four times a day (QID) | ORAL | Status: DC
  Administered 2012-11-15 (×4): 1000 mg via ORAL
  Filled 2012-11-14 (×9): qty 2

## 2012-11-14 MED ORDER — ONDANSETRON HCL 4 MG/2ML IJ SOLN
4.0000 mg | Freq: Four times a day (QID) | INTRAMUSCULAR | Status: DC | PRN
  Administered 2012-11-15 – 2012-11-16 (×2): 4 mg via INTRAVENOUS
  Filled 2012-11-14 (×2): qty 2

## 2012-11-14 MED ORDER — HEPARIN SODIUM (PORCINE) 1000 UNIT/ML IJ SOLN
INTRAMUSCULAR | Status: DC | PRN
  Administered 2012-11-14: 22000 [IU] via INTRAVENOUS
  Administered 2012-11-14: 5000 [IU] via INTRAVENOUS

## 2012-11-14 MED ORDER — SODIUM CHLORIDE 0.9 % IR SOLN
Status: DC | PRN
  Administered 2012-11-14: 09:00:00

## 2012-11-14 MED ORDER — EPHEDRINE SULFATE 50 MG/ML IJ SOLN
INTRAMUSCULAR | Status: DC | PRN
  Administered 2012-11-14: 10 mg via INTRAVENOUS
  Administered 2012-11-14 (×3): 5 mg via INTRAVENOUS

## 2012-11-14 MED ORDER — DEXMEDETOMIDINE HCL IN NACL 200 MCG/50ML IV SOLN: 0.1000 ug/kg/h | INTRAVENOUS | Status: DC

## 2012-11-14 MED ORDER — PANTOPRAZOLE SODIUM 40 MG PO TBEC
40.0000 mg | DELAYED_RELEASE_TABLET | Freq: Every day | ORAL | Status: DC
  Filled 2012-11-14: qty 1

## 2012-11-14 MED ORDER — DEXTROSE 50 % IV SOLN
15.0000 mL | Freq: Once | INTRAVENOUS | Status: AC
  Administered 2012-11-14 – 2012-11-15 (×2): 15 mL via INTRAVENOUS

## 2012-11-14 MED ORDER — NITROGLYCERIN IN D5W 200-5 MCG/ML-% IV SOLN: 0.0000 ug/min | INTRAVENOUS | Status: DC

## 2012-11-14 MED ORDER — METOPROLOL TARTRATE 25 MG/10 ML ORAL SUSPENSION
12.5000 mg | Freq: Two times a day (BID) | ORAL | Status: DC
  Filled 2012-11-14 (×3): qty 5

## 2012-11-14 MED ORDER — ROCURONIUM BROMIDE 100 MG/10ML IV SOLN
INTRAVENOUS | Status: DC | PRN
  Administered 2012-11-14: 50 mg via INTRAVENOUS

## 2012-11-14 MED ORDER — METOCLOPRAMIDE HCL 5 MG/ML IJ SOLN
10.0000 mg | Freq: Four times a day (QID) | INTRAMUSCULAR | Status: AC
  Administered 2012-11-14 – 2012-11-15 (×4): 10 mg via INTRAVENOUS
  Filled 2012-11-14 (×5): qty 2

## 2012-11-14 MED ORDER — ETOMIDATE 2 MG/ML IV SOLN
INTRAVENOUS | Status: DC | PRN
  Administered 2012-11-14: 12 mg via INTRAVENOUS

## 2012-11-14 MED ORDER — ACETAMINOPHEN 650 MG RE SUPP
650.0000 mg | Freq: Once | RECTAL | Status: AC
  Administered 2012-11-14: 650 mg via RECTAL

## 2012-11-14 MED ORDER — THROMBIN 20000 UNITS EX KIT
PACK | CUTANEOUS | Status: DC | PRN
  Administered 2012-11-14: 20000 [IU] via TOPICAL

## 2012-11-14 MED ORDER — METOPROLOL TARTRATE 12.5 MG HALF TABLET
12.5000 mg | ORAL_TABLET | Freq: Two times a day (BID) | ORAL | Status: DC
  Filled 2012-11-14 (×3): qty 1

## 2012-11-14 MED ORDER — ASPIRIN EC 325 MG PO TBEC
325.0000 mg | DELAYED_RELEASE_TABLET | Freq: Every day | ORAL | Status: DC
  Administered 2012-11-15: 325 mg via ORAL
  Filled 2012-11-14 (×2): qty 1

## 2012-11-14 MED ORDER — ALBUMIN HUMAN 5 % IV SOLN
250.0000 mL | INTRAVENOUS | Status: AC | PRN
  Administered 2012-11-14 (×4): 250 mL via INTRAVENOUS
  Filled 2012-11-14 (×2): qty 250

## 2012-11-14 MED ORDER — METOPROLOL TARTRATE 1 MG/ML IV SOLN: 2.5000 mg | INTRAVENOUS | Status: DC | PRN

## 2012-11-14 MED ORDER — MORPHINE SULFATE 2 MG/ML IJ SOLN
2.0000 mg | INTRAMUSCULAR | Status: DC | PRN
  Administered 2012-11-14 – 2012-11-15 (×6): 4 mg via INTRAVENOUS
  Filled 2012-11-14 (×4): qty 2
  Filled 2012-11-14: qty 1
  Filled 2012-11-14: qty 2
  Filled 2012-11-14: qty 1

## 2012-11-14 MED ORDER — THROMBIN 20000 UNITS EX SOLR
CUTANEOUS | Status: AC
  Filled 2012-11-14: qty 20000

## 2012-11-14 MED ORDER — SIMVASTATIN 20 MG PO TABS
20.0000 mg | ORAL_TABLET | Freq: Every day | ORAL | Status: DC
  Administered 2012-11-15 – 2012-11-18 (×3): 20 mg via ORAL
  Filled 2012-11-14 (×6): qty 1

## 2012-11-14 MED ORDER — SODIUM CHLORIDE 0.9 % IJ SOLN
3.0000 mL | INTRAMUSCULAR | Status: DC | PRN
  Administered 2012-11-15: 3 mL via INTRAVENOUS

## 2012-11-14 MED ORDER — SODIUM CHLORIDE 0.9 % IV SOLN
250.0000 mL | INTRAVENOUS | Status: DC
  Administered 2012-11-15: 250 mL via INTRAVENOUS

## 2012-11-14 MED ORDER — DEXTROSE 50 % IV SOLN
INTRAVENOUS | Status: AC
  Filled 2012-11-14: qty 50

## 2012-11-14 MED ORDER — MORPHINE SULFATE 2 MG/ML IJ SOLN: 1.0000 mg | INTRAMUSCULAR | Status: AC | PRN

## 2012-11-14 MED ORDER — DEXTROSE 5 % IV SOLN
1.5000 g | Freq: Two times a day (BID) | INTRAVENOUS | Status: AC
  Administered 2012-11-14 – 2012-11-16 (×4): 1.5 g via INTRAVENOUS
  Filled 2012-11-14 (×4): qty 1.5

## 2012-11-14 MED ORDER — MAGNESIUM SULFATE 40 MG/ML IJ SOLN
4.0000 g | Freq: Once | INTRAMUSCULAR | Status: AC
  Administered 2012-11-14: 4 g via INTRAVENOUS
  Filled 2012-11-14: qty 100

## 2012-11-14 MED ORDER — GLYCOPYRROLATE 0.2 MG/ML IJ SOLN
INTRAMUSCULAR | Status: DC | PRN
  Administered 2012-11-14: 0.2 mg via INTRAVENOUS

## 2012-11-14 MED ORDER — FENTANYL CITRATE 0.05 MG/ML IJ SOLN
INTRAMUSCULAR | Status: DC | PRN
  Administered 2012-11-14: 50 ug via INTRAVENOUS
  Administered 2012-11-14: 100 ug via INTRAVENOUS
  Administered 2012-11-14: 25 ug via INTRAVENOUS
  Administered 2012-11-14: 50 ug via INTRAVENOUS
  Administered 2012-11-14: 250 ug via INTRAVENOUS
  Administered 2012-11-14: 100 ug via INTRAVENOUS
  Administered 2012-11-14: 50 ug via INTRAVENOUS
  Administered 2012-11-14: 100 ug via INTRAVENOUS
  Administered 2012-11-14: 250 ug via INTRAVENOUS
  Administered 2012-11-14: 100 ug via INTRAVENOUS
  Administered 2012-11-14: 250 ug via INTRAVENOUS
  Administered 2012-11-14: 150 ug via INTRAVENOUS
  Administered 2012-11-14: 25 ug via INTRAVENOUS

## 2012-11-14 MED ORDER — PHENYLEPHRINE HCL 10 MG/ML IJ SOLN
0.0000 ug/min | INTRAVENOUS | Status: DC
  Administered 2012-11-14: 45 ug/min via INTRAVENOUS
  Administered 2012-11-15: 20 ug/min via INTRAVENOUS
  Administered 2012-11-15 (×2): 60 ug/min via INTRAVENOUS
  Filled 2012-11-14 (×5): qty 2

## 2012-11-14 MED ORDER — LACTATED RINGERS IV SOLN: INTRAVENOUS | Status: DC

## 2012-11-14 MED ORDER — DOCUSATE SODIUM 100 MG PO CAPS
200.0000 mg | ORAL_CAPSULE | Freq: Every day | ORAL | Status: DC
  Administered 2012-11-15: 200 mg via ORAL
  Filled 2012-11-14: qty 2

## 2012-11-14 MED ORDER — ARTIFICIAL TEARS OP OINT
TOPICAL_OINTMENT | OPHTHALMIC | Status: DC | PRN
  Administered 2012-11-14: 1 via OPHTHALMIC

## 2012-11-14 MED ORDER — ONDANSETRON HCL 4 MG/2ML IJ SOLN: 4.0000 mg | Freq: Once | INTRAMUSCULAR | Status: DC | PRN

## 2012-11-14 MED ORDER — VECURONIUM BROMIDE 10 MG IV SOLR
INTRAVENOUS | Status: DC | PRN
  Administered 2012-11-14: 5 mg via INTRAVENOUS
  Administered 2012-11-14: 10 mg via INTRAVENOUS

## 2012-11-14 MED FILL — Heparin Sodium (Porcine) Inj 1000 Unit/ML: INTRAMUSCULAR | Qty: 10 | Status: AC

## 2012-11-14 MED FILL — Electrolyte-R (PH 7.4) Solution: INTRAVENOUS | Qty: 3000 | Status: AC

## 2012-11-14 MED FILL — Sodium Chloride Irrigation Soln 0.9%: Qty: 3000 | Status: AC

## 2012-11-14 MED FILL — Mannitol IV Soln 20%: INTRAVENOUS | Qty: 500 | Status: AC

## 2012-11-14 MED FILL — Lidocaine HCl IV Inj 20 MG/ML: INTRAVENOUS | Qty: 5 | Status: AC

## 2012-11-14 MED FILL — Sodium Bicarbonate IV Soln 8.4%: INTRAVENOUS | Qty: 50 | Status: AC

## 2012-11-14 SURGICAL SUPPLY — 129 items
APL SKNCLS STERI-STRIP NONHPOA (GAUZE/BANDAGES/DRESSINGS) ×3
ATTRACTOMAT 16X20 MAGNETIC DRP (DRAPES) ×4 IMPLANT
BAG DECANTER FOR FLEXI CONT (MISCELLANEOUS) ×5 IMPLANT
BANDAGE ELASTIC 4 VELCRO ST LF (GAUZE/BANDAGES/DRESSINGS) ×4 IMPLANT
BANDAGE ELASTIC 6 VELCRO ST LF (GAUZE/BANDAGES/DRESSINGS) ×4 IMPLANT
BANDAGE GAUZE ELAST BULKY 4 IN (GAUZE/BANDAGES/DRESSINGS) ×4 IMPLANT
BASKET HEART (ORDER IN 25'S) (MISCELLANEOUS) ×1
BASKET HEART (ORDER IN 25S) (MISCELLANEOUS) ×3 IMPLANT
BENZOIN TINCTURE PRP APPL 2/3 (GAUZE/BANDAGES/DRESSINGS) ×4 IMPLANT
BLADE STERNUM SYSTEM 6 (BLADE) ×4 IMPLANT
CANISTER SUCTION 2500CC (MISCELLANEOUS) ×8 IMPLANT
CANNULA VENOUS LOW PROF 34X46 (CANNULA) ×4 IMPLANT
CANNULA VESSEL 3MM 2 BLNT TIP (CANNULA) ×1 IMPLANT
CATH ROBINSON RED A/P 18FR (CATHETERS) ×12 IMPLANT
CATH THORACIC 28FR (CATHETERS) ×5 IMPLANT
CATH THORACIC 28FR RT ANG (CATHETERS) ×1 IMPLANT
CATH THORACIC 36FR (CATHETERS) ×4 IMPLANT
CATH THORACIC 36FR RT ANG (CATHETERS) ×4 IMPLANT
CLIP LIGATING EXTRA MED SLVR (CLIP) ×4 IMPLANT
CLIP LIGATING EXTRA SM BLUE (MISCELLANEOUS) ×4 IMPLANT
CLIP TI MEDIUM 24 (CLIP) IMPLANT
CLIP TI WIDE RED SMALL 24 (CLIP) IMPLANT
CLSR STERI-STRIP ANTIMIC 1/2X4 (GAUZE/BANDAGES/DRESSINGS) ×1 IMPLANT
CONN Y 3/8X3/8X3/8  BEN (MISCELLANEOUS) ×1
CONN Y 3/8X3/8X3/8 BEN (MISCELLANEOUS) IMPLANT
COVER SURGICAL LIGHT HANDLE (MISCELLANEOUS) ×8 IMPLANT
CRADLE DONUT ADULT HEAD (MISCELLANEOUS) ×8 IMPLANT
DECANTER SPIKE VIAL GLASS SM (MISCELLANEOUS) IMPLANT
DRAIN HEMOVAC 1/8 X 5 (WOUND CARE) IMPLANT
DRAIN WOUND SNY 15 RND (WOUND CARE) ×1 IMPLANT
DRAPE CARDIOVASCULAR INCISE (DRAPES) ×4
DRAPE INCISE IOBAN 66X45 STRL (DRAPES) ×1 IMPLANT
DRAPE SLUSH/WARMER DISC (DRAPES) IMPLANT
DRAPE SRG 135X102X78XABS (DRAPES) ×3 IMPLANT
DRAPE WARM FLUID 44X44 (DRAPE) ×4 IMPLANT
DRSG COVADERM 4X14 (GAUZE/BANDAGES/DRESSINGS) ×4 IMPLANT
DRSG COVADERM 4X6 (GAUZE/BANDAGES/DRESSINGS) ×1 IMPLANT
ELECT CAUTERY BLADE 6.4 (BLADE) ×4 IMPLANT
ELECT REM PT RETURN 9FT ADLT (ELECTROSURGICAL) ×12
ELECTRODE REM PT RTRN 9FT ADLT (ELECTROSURGICAL) ×9 IMPLANT
EVACUATOR SILICONE 100CC (DRAIN) ×1 IMPLANT
GEL ULTRASOUND 20GR AQUASONIC (MISCELLANEOUS) IMPLANT
GLOVE BIO SURGEON STRL SZ 6 (GLOVE) IMPLANT
GLOVE BIO SURGEON STRL SZ 6.5 (GLOVE) ×7 IMPLANT
GLOVE BIO SURGEON STRL SZ7 (GLOVE) IMPLANT
GLOVE BIO SURGEON STRL SZ7.5 (GLOVE) IMPLANT
GLOVE BIOGEL PI IND STRL 6 (GLOVE) IMPLANT
GLOVE BIOGEL PI IND STRL 6.5 (GLOVE) IMPLANT
GLOVE BIOGEL PI IND STRL 7.0 (GLOVE) IMPLANT
GLOVE BIOGEL PI INDICATOR 6 (GLOVE) ×1
GLOVE BIOGEL PI INDICATOR 6.5 (GLOVE) ×4
GLOVE BIOGEL PI INDICATOR 7.0 (GLOVE)
GLOVE EUDERMIC 7 POWDERFREE (GLOVE) ×8 IMPLANT
GLOVE ORTHO TXT STRL SZ7.5 (GLOVE) IMPLANT
GLOVE SS BIOGEL STRL SZ 7.5 (GLOVE) ×3 IMPLANT
GLOVE SUPERSENSE BIOGEL SZ 7.5 (GLOVE) ×1
GOWN PREVENTION PLUS XLARGE (GOWN DISPOSABLE) ×5 IMPLANT
GOWN STRL NON-REIN LRG LVL3 (GOWN DISPOSABLE) ×28 IMPLANT
HEMOSTAT POWDER SURGIFOAM 1G (HEMOSTASIS) ×12 IMPLANT
HEMOSTAT SURGICEL 2X14 (HEMOSTASIS) ×4 IMPLANT
INSERT FOGARTY 61MM (MISCELLANEOUS) IMPLANT
INSERT FOGARTY XLG (MISCELLANEOUS) IMPLANT
KIT BASIN OR (CUSTOM PROCEDURE TRAY) ×8 IMPLANT
KIT CATH CPB BARTLE (MISCELLANEOUS) ×4 IMPLANT
KIT ROOM TURNOVER OR (KITS) ×8 IMPLANT
KIT SUCTION CATH 14FR (SUCTIONS) ×4 IMPLANT
KIT VASOVIEW W/TROCAR VH 2000 (KITS) ×4 IMPLANT
LOOP VESSEL MINI RED (MISCELLANEOUS) ×1 IMPLANT
NEEDLE 22X1 1/2 (OR ONLY) (NEEDLE) IMPLANT
NS IRRIG 1000ML POUR BTL (IV SOLUTION) ×28 IMPLANT
PACK CAROTID (CUSTOM PROCEDURE TRAY) ×4 IMPLANT
PACK OPEN HEART (CUSTOM PROCEDURE TRAY) ×4 IMPLANT
PAD ARMBOARD 7.5X6 YLW CONV (MISCELLANEOUS) ×16 IMPLANT
PAD ELECT DEFIB RADIOL ZOLL (MISCELLANEOUS) ×4 IMPLANT
PATCH HEMASHIELD 8X75 (Vascular Products) ×1 IMPLANT
PENCIL BUTTON HOLSTER BLD 10FT (ELECTRODE) ×4 IMPLANT
PUNCH AORTIC ROTATE 4.0MM (MISCELLANEOUS) ×1 IMPLANT
PUNCH AORTIC ROTATE 4.5MM 8IN (MISCELLANEOUS) ×4 IMPLANT
PUNCH AORTIC ROTATE 5MM 8IN (MISCELLANEOUS) IMPLANT
SET CARDIOPLEGIA MPS 5001102 (MISCELLANEOUS) ×1 IMPLANT
SHUNT CAROTID BYPASS 10 (VASCULAR PRODUCTS) ×1 IMPLANT
SHUNT CAROTID BYPASS 12FRX15.5 (VASCULAR PRODUCTS) IMPLANT
SPONGE GAUZE 4X4 12PLY (GAUZE/BANDAGES/DRESSINGS) ×9 IMPLANT
SPONGE INTESTINAL PEANUT (DISPOSABLE) IMPLANT
SPONGE LAP 18X18 X RAY DECT (DISPOSABLE) ×1 IMPLANT
SPONGE LAP 4X18 X RAY DECT (DISPOSABLE) ×4 IMPLANT
STRIP CLOSURE SKIN 1/2X4 (GAUZE/BANDAGES/DRESSINGS) ×5 IMPLANT
SUT BONE WAX W31G (SUTURE) ×4 IMPLANT
SUT ETHILON 3 0 FSL (SUTURE) ×1 IMPLANT
SUT ETHILON 3 0 PS 1 (SUTURE) ×1 IMPLANT
SUT MNCRL AB 4-0 PS2 18 (SUTURE) ×1 IMPLANT
SUT PROLENE 3 0 SH DA (SUTURE) IMPLANT
SUT PROLENE 3 0 SH1 36 (SUTURE) ×4 IMPLANT
SUT PROLENE 4 0 RB 1 (SUTURE)
SUT PROLENE 4 0 SH DA (SUTURE) IMPLANT
SUT PROLENE 4-0 RB1 .5 CRCL 36 (SUTURE) IMPLANT
SUT PROLENE 5 0 C 1 36 (SUTURE) IMPLANT
SUT PROLENE 6 0 C 1 30 (SUTURE) ×1 IMPLANT
SUT PROLENE 6 0 CC (SUTURE) ×5 IMPLANT
SUT PROLENE 7 0 BV 1 (SUTURE) ×2 IMPLANT
SUT PROLENE 7 0 BV1 MDA (SUTURE) ×4 IMPLANT
SUT PROLENE 8 0 BV175 6 (SUTURE) ×1 IMPLANT
SUT SILK  1 MH (SUTURE) ×1
SUT SILK 1 MH (SUTURE) IMPLANT
SUT SILK 3 0 (SUTURE) ×4
SUT SILK 3-0 18XBRD TIE 12 (SUTURE) IMPLANT
SUT STEEL STERNAL CCS#1 18IN (SUTURE) IMPLANT
SUT STEEL SZ 6 DBL 3X14 BALL (SUTURE) IMPLANT
SUT VIC AB 1 CTX 36 (SUTURE) ×8
SUT VIC AB 1 CTX36XBRD ANBCTR (SUTURE) ×6 IMPLANT
SUT VIC AB 2-0 CT1 27 (SUTURE) ×4
SUT VIC AB 2-0 CT1 TAPERPNT 27 (SUTURE) IMPLANT
SUT VIC AB 2-0 CTX 27 (SUTURE) IMPLANT
SUT VIC AB 3-0 SH 27 (SUTURE) ×16
SUT VIC AB 3-0 SH 27X BRD (SUTURE) ×6 IMPLANT
SUT VIC AB 3-0 X1 27 (SUTURE) IMPLANT
SUT VICRYL 4-0 PS2 18IN ABS (SUTURE) ×5 IMPLANT
SUTURE E-PAK OPEN HEART (SUTURE) ×4 IMPLANT
SYR CONTROL 10ML LL (SYRINGE) IMPLANT
SYSTEM SAHARA CHEST DRAIN ATS (WOUND CARE) ×4 IMPLANT
TAPE CLOTH SURG 4X10 WHT LF (GAUZE/BANDAGES/DRESSINGS) ×1 IMPLANT
TAPE STRIPS DRAPE STRL (GAUZE/BANDAGES/DRESSINGS) ×1 IMPLANT
TOWEL OR 17X24 6PK STRL BLUE (TOWEL DISPOSABLE) ×8 IMPLANT
TOWEL OR 17X26 10 PK STRL BLUE (TOWEL DISPOSABLE) ×8 IMPLANT
TRAY FOLEY IC TEMP SENS 14FR (CATHETERS) ×4 IMPLANT
TUBE SUCT INTRACARD DLP 20F (MISCELLANEOUS) ×4 IMPLANT
TUBING INSUFFLATION 10FT LAP (TUBING) ×4 IMPLANT
UNDERPAD 30X30 INCONTINENT (UNDERPADS AND DIAPERS) ×4 IMPLANT
WATER STERILE IRR 1000ML POUR (IV SOLUTION) ×12 IMPLANT

## 2012-11-14 NOTE — Progress Notes (Signed)
[facility] BRIEF SICU PROGRESS NOTE  Day of Surgery  S/P Procedure(s) (LRB): CORONARY ARTERY BYPASS GRAFTING (CABG) times three using left internal mammary artery and left saphenous vein (N/A) INTRAOPERATIVE TRANSESOPHAGEAL ECHOCARDIOGRAM (N/A) RIGHT ENDARTERECTOMY CAROTID with patch angioplasty. (Right)   Starting to wake on vent NSR/AAI paced w/ stable hemodynamics on low dose Neo Chest tube output low UOP excellent Labs okay  Plan: Continue routine early postop  OWEN,CLARENCE H 11/14/2012 6:11 PM

## 2012-11-14 NOTE — Op Note (Signed)
[facility]  Patient name: Tonya Valdez MRN: [id number] DOB: 01-04-1941 Sex: female  11/14/2012 Pre-operative Diagnosis: Asymptomatic right carotid stenosis Post-operative diagnosis:  Same Surgeon:  Larina Earthly, M.D. Assistants:  Vedia Pereyra, [facility] Procedure:    right carotid Endarterectomy with Dacron patch angioplasty Anesthesia:  General Blood Loss:  See anesthesia record   Indications for surgery:  Severe asymptomatic right internal carotid artery stenosis  Procedure in detail:  The patient was taken to the operating and placed in the supine position. The neck was prepped and draped in the usual sterile fashion. She is going take time to mid coronary bypass grafting was also prepped in the usual fashion for this. An incision was made anterior to the sternocleidomastoid muscle and continued with electrocautery through the platysma muscle. The muscle was retracted posteriorly and the carotid sheath was opened. The facial vein was ligated with 2-0 silk ties and divided. The common carotid artery was encircled with an umbilical tape and Rummel tourniquet. Dissection was continued onto the carotid bifurcation. The superior thyroid artery was controlled with a 2-0 silk Potts tie. The external carotid organ was encircled with a vessel loop and the internal carotid was encircled with umbilical tape and Rummel tourniquet. The hypoglossal and vagus nerves were identified and preserved.  The patient was given systemic heparinization. After adequate circulation time, the internal,external and common carotid arteries were occluded. The common carotid was opened with an 11 blade and the arteriotomy was continued with Potts scissors onto the internal carotid artery. A 10 shunt was passed up the internal carotid artery, allowed to back bleed, and then passed down the common carotid artery. The shunt was secured with Rummel tourniquet. The endarterectomy was begun on the  common carotid artery  plaque was divided proximally with Potts scissors. The endarterectomy was continued onto the carotid bifurcation. The external carotid was endarterectomized by eversion technique and the internal carotid artery was endarterectomized in an open fashion. Remaining debris was removed from the endarterectomy plane. A Dacron patch was brought to the field and sewn as a patch angioplasty. Prior to completing the anastomosis, the shunt was removed and the usual flushing maneuvers were undertaken. The anastomosis was then completed and flow was restored first to the external and then the internal carotid artery. Excellent flow characteristics were noted with hand-held Doppler in the internal and external carotid arteries.  The wound is not formally closed. Surgicel and a Ray-Tec gauze were placed in the base of the wound and 3-0 nylon sutures were used for temporary closure. The patient does and undergo coronary artery bypass and will have a closure at the end of this procedure. This procedure will be dictated as a separate note by Dr. Laneta Simmers  Carotid stenosis at surgery: Greater than 80  Disposition:  To PACU in stable condition,neurologically intact  Relevant Operative Details:  Bifurcation at the mid neck  Larina Earthly, M.D. [facility] Office: [phone] Pager:  [phone]

## 2012-11-14 NOTE — Progress Notes (Signed)
11/14/12 1330  Vitals  Temp 97.2 F (36.2 C)  Temp src Core  BP ! 75/49 mmHg  MAP (mmHg) 55  BP Location Right arm  BP Method Automatic  Patient Position, if appropriate Lying  Pulse Rate 80  Pulse Rate Source Monitor  ECG Heart Rate 80  Cardiac Rhythm Atrial paced  Resp 14  Oxygen Therapy  SpO2 99 %  O2 Device Ventilator  Pre-WUA / WUA Start  [place] Agitation Sedation Scale (RASS) -3  RASS Goal -3  Art Line  Arterial Line BP 64/37 mmHg  Arterial Line MAP (mmHg) 46 mmHg  Invasive Hemodynamic Monitoring  PAP 16/8 mmHg  PAP (Mean) 11 mmHg  PCA/Epidural/Spinal Assessment  Respiratory Pattern Regular;Unlabored  Glasgow Coma Scale  Unable to Complete GCS due to  Medication  Height and Weight  Height 5\' 2"  (1.575 m)  Weight 44 kg (97 lb)  BSA (Calculated - sq m) 1.39 sq meters  BMI (Calculated) 17.8  Weight in (lb) to have BMI = 25 136.4  Albumin 5% 250 ml administered

## 2012-11-14 NOTE — Progress Notes (Signed)
11/14/12 1415  Vitals  Temp ! 96.8 F (36 C)  Temp src Core  Pulse Rate 79  Pulse Rate Source Monitor  ECG Heart Rate 80  Cardiac Rhythm Atrial paced  Resp 14  Oxygen Therapy  SpO2 100 %  O2 Device Ventilator  Art Line  Arterial Line BP 103/46 mmHg  Arterial Line MAP (mmHg) 63 mmHg  Invasive Hemodynamic Monitoring  PAP 19/9 mmHg  PAP (Mean) 13 mmHg  CO (L/min) 2.1 L/min  CI (L/min/m2) 1.5 L/min/m2  Albumin 5% 250 ml administered

## 2012-11-14 NOTE — OR Nursing (Signed)
2nd call SICU. 1245

## 2012-11-14 NOTE — Procedures (Signed)
Extubation Procedure Note  Patient Details:   Name: Tonya Valdez DOB: 02-27-1940 MRN: [id number]   Airway Documentation:     Evaluation  O2 sats: stable throughout Complications: No apparent complications Patient did tolerate procedure well. Bilateral Breath Sounds: Clear   Yes  Pt tolerated SICU rapid wean, achieved 1.7L VC, -40 NIF, was positive for cuff leak and extubated to 4lpm [place]. No dyspnea or stridor noted after extubation. Pt appears more comfortable and all vitals are within normal limits at this time. RT Will continue to monitor.   Beatris Si 11/14/2012, 7:16 PM

## 2012-11-14 NOTE — OR Nursing (Signed)
1st call to [facility] SICU 1230.

## 2012-11-14 NOTE — Transfer of Care (Signed)
Immediate Anesthesia Transfer of Care Note  Patient: Tonya Valdez  Procedure(s) Performed: Procedure(s): CORONARY ARTERY BYPASS GRAFTING (CABG) times three using left internal mammary artery and left saphenous vein (N/A) INTRAOPERATIVE TRANSESOPHAGEAL ECHOCARDIOGRAM (N/A) RIGHT ENDARTERECTOMY CAROTID with patch angioplasty. (Right)  Patient Location: SICU  Anesthesia Type:General  Level of Consciousness: Patient remains intubated per anesthesia plan  Airway & Oxygen Therapy: Patient remains intubated per anesthesia plan and Patient placed on Ventilator (see vital sign flow sheet for setting)  Post-op Assessment: Report given to PACU RN  Post vital signs: Reviewed and stable  Complications: No apparent anesthesia complications

## 2012-11-14 NOTE — Progress Notes (Signed)
11/14/12 1800  Vitals  Temp 97.7 F (36.5 C)  Temp src Core  BP ! 81/48 mmHg  MAP (mmHg) 55  BP Location Right arm  BP Method Automatic  Patient Position, if appropriate Lying  Pulse Rate 80  Pulse Rate Source Monitor  ECG Heart Rate 80  Cardiac Rhythm Atrial paced  Resp 11  Oxygen Therapy  SpO2 100 %  O2 Device Ventilator  Pre-WUA / WUA Start  [place] Agitation Sedation Scale (RASS) -1  Art Line  Arterial Line BP 86/42 mmHg  Arterial Line MAP (mmHg) 56 mmHg  Invasive Hemodynamic Monitoring  PAP 31/18 mmHg  PAP (Mean) 23 mmHg  CO (L/min) 2.2 L/min  CI (L/min/m2) 1.6 L/min/m2  Pain Assessment  Pain Assessment No/denies pain  Glasgow Coma Scale  Eye Opening 4  Best Verbal Response 0  Modified Verbal Response 5  Best Motor Response 6  Glasgow Coma Scale Score 15  Albumin 5% 250 ml administered

## 2012-11-14 NOTE — Interval H&P Note (Signed)
History and Physical Interval Note:  11/14/2012 6:44 AM  Tonya Valdez  has presented today for surgery, with the diagnosis of cad  The various methods of treatment have been discussed with the patient and family. After consideration of risks, benefits and other options for treatment, the patient has consented to  Procedure(s): CORONARY ARTERY BYPASS GRAFTING (CABG) (N/A) INTRAOPERATIVE TRANSESOPHAGEAL ECHOCARDIOGRAM (N/A) RIGHT ENDARTERECTOMY CAROTID (Right) as a surgical intervention .  The patient's history has been reviewed, patient examined, no change in status, stable for surgery.  I have reviewed the patient's chart and labs.  Questions were answered to the patient's satisfaction.     Mischa Brittingham

## 2012-11-14 NOTE — CV Procedure (Signed)
Intra-operative Transesophageal Echocardiography Report:  Tonya Valdez is a 72 year old female with a history of bladder cancer who developed signs of CHF. She subsequently underwent cardiac catheterization which revealed severe three-vessel coronary disease and moderate left main stenosis with left ventricular dysfunction. The EF was 30% by echo. She was also found to have bilateral high-grade carotid stenosis with 80-99% right carotid stenosis. She is now scheduled to undergo coronary artery bypass grafting by Dr. Laneta Simmers and right carotid endarterectomy by Dr. Arbie Cookey. Intraoperative transesophageal echocardiography was indicated to evaluate the left and right ventricular function , to determine if there was any valvular pathology, and to assess for intracardiac air.  The patient was brought to the operating room at [facility] and general anesthesia was induced without difficulty. Following endotracheal intubation and orogastric suctioning, the transesophageal echocardiography probe was inserted into the esophagus without difficulty.  Impression: Pre-bypass findings:  1. Aortic valve: The aortic valve was trileaflet. The leaflets were mildly thickened but opened normally. There was no aortic insufficiency.  2. Mitral Valve: There was moderate mitral annular calcification. However the leaflets opened normally and coapted well without prolapsing, billowing or flail segments noted. There was trace mitral insufficiency  3. Left ventricle: The Left ventricular cavity was dilated and measured 4.5 cm at end diastole at the mid-papillary level in the transgastric short axis view. Left ventricular wall thickness measured 0.80 cm of the inferior wall and 1.12 cm of the anterior wall. There appeared to be inferior wall akinesis at the basilar and mid levels. There appeared to be normal contractility of the anterior wall, anterior septum, and lateral wall. The inferior wall was  thinned compared to the  anterior and lateral walls but was not dyskinetic. There was no thrombus noted in the left ventricular apex. Left ventricular ejection fraction was estimated at 40-45%.  4. Right ventricle: The right ventricular cavity was of normal size and there was normal contractility the right ventricular free wall.  5. Tricuspid valve: Tricuspid valve appeared structurally normal there was trace tricuspid insufficiency  6. 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  7. Left atrium:  There was no thrombus noted in the left atrium or left atrial appendage  8. Ascending aorta: The walls of the ascending aorta were heavily calcified.There was no aneurysmal dilatation of the aorta. There was a well-defined aortic root and sinotubular ridge without effacement. There were no obvious protruding atheromatous or mobile plaques noted in the ascending aorta.  9. Descending aorta the descending aorta showed grade 3 plaque scattered throughout and  the diameter of the descending aorta was 1.90 cm  Post-bypass findings:  1. Aortic valve: The aortic valve appeared unchanged from the pre-bypass study. The leaflets opened normally and there was no aortic insufficiency.  2. Mitral valve: The mitral leaflets opened normally and there was trace mitral insufficiency.  3. Left ventricle: There again appeared to be akinesis in the basilar and mid inferior wall. There appeared to be normal contractility of the anterior wall and lateral walls and anterior septum. Ejection fraction was estimated at 45-50%  4. Right ventricle: The right ventricular cavity appeared to be in normal limits in size and there was good contractility the right ventricular free wall  5. Tricuspid valve: The tricuspid valve appeared structurally normal and there was trace tricuspid insufficiency  Kipp Brood, M.D.

## 2012-11-14 NOTE — Brief Op Note (Signed)
11/14/2012  11:20 AM  PATIENT:  Tonya Valdez  72 y.o. female  PRE-OPERATIVE DIAGNOSIS:  Coronary artery disease, Right internal carotid artery stenosis  POST-OPERATIVE DIAGNOSIS:  Coronary artery disease, Right internal carotid artery stenosis  PROCEDURE:    CORONARY ARTERY BYPASS GRAFTING x 3(LIMA-LAD, SVG-OM, SVG-PD)   ENDOSCOPIC VEIN HARVEST LEFT THIGH  RIGHT CAROTID ENDARTERECTOMY WITH PATCH ANGIOPLASTY  SURGEON:  Alleen Borne, MD                       Larina Earthly, MD  ASSISTANT: Coral Ceo, PA-C                       Fabienne Bruns, MD  ANESTHESIA:   general  PATIENT CONDITION:  ICU - intubated and hemodynamically stable.  PRE-OPERATIVE WEIGHT: 44 kg

## 2012-11-14 NOTE — OR Nursing (Signed)
On the way call made to SICU @ 1313

## 2012-11-14 NOTE — H&P (View-Only) (Signed)
[facility] [place]   Patient name: Tonya Valdez MRN: [id number] DOB: 02/22/1941 Sex: female   Referred by: Jonny Ruiz  Reason for referral:  Chief Complaint  Patient presents with  . New Evaluation    Dr. Laneta Simmers wants to do combination surgery for carotid stenosis     HISTORY OF PRESENT ILLNESS: The patient presents today for evaluation of asymptomatic severe carotid disease. 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 She was found to have some pulmonary symptoms of congestive failure and underwent evaluation to include the stress test and eventual cardiac catheterization. She was found to have severe coronary disease and is scheduled for coronary bypass grafting. She does not have any history of prior stroke amaurosis fugax or transient ischemic attack. She underwent carotid duplex at her cardiology office and this showed severe right and moderate to severe left carotid stenosis. She is right-handed. The internal carotid artery becomes normal above the level of the plaque. She also was found to have calcified iliac arteries with moderate distal aortic stenosis and severe bilateral common iliac artery stenoses. She has no symptoms of claudication and no history of prior atheroemboli or tissue loss.  Past Medical History  Diagnosis Date  . Positive TB test   . Bladder tumor   . COPD (chronic obstructive pulmonary disease)   . Diabetes mellitus TYPE II--  PER PCP NOTE DR Oliver Barre    PER PT DOES NOT TAKES METFORMIN PRESCRIBED, SHE WATCHES DIET AND EXERCISES  . Congestive heart failure, unspecified 08/04/2012  . Bilateral carotid artery disease 09/26/2012  . CAD (coronary artery disease)   . Cancer     bladder    Past Surgical History  Procedure Laterality Date  . Tonsillectomy  1959  . Hiatal hernia repair  1964  (AGE 73)    CHOLECYSTECTOMY AND APPENDECTOMY  . Left elbow surgery    . Lumbar disc surgery  1993   . Abdominal hysterectomy  1981 (APPROX)    BILATERAL SALPINGO-OOPHORECTOMY WITH EXCEPTION A SMALL PIECE OF OVARY REMAINS  . Transurethral resection of bladder tumor N/A 06/24/2012    Procedure: TRANSURETHRAL RESECTION OF BLADDER TUMOR  WITH GYRUS AND INSTILLATION OF MITOMYCIN C  (TURBT);  Surgeon: Garnett Farm, MD;  Location: [facility];  Service: Urology;  Laterality: N/A;  . Appendectomy    . Wrist surgery      History   Social History  . Marital Status: Widowed    Spouse Name: N/A    Number of Children: 2  . Years of Education: 16   Occupational History  . RN, BSN    Social History Main Topics  . Smoking status: Current Every Day Smoker -- 0.20 packs/day for 50 years    Types: Cigarettes  . Smokeless tobacco: Never Used     Comment: PT HAS BEEN QUITTING--  SMOKES 4 CIG'S PER DAY,  DOWN FROM 1PPD  . Alcohol Use: No     Comment: occasional wine not often  . Drug Use: No  . Sexual Activity: Not on file   Other Topics Concern  . Not on file   Social History Narrative  . No narrative on file    Family History  Problem Relation Age of Onset  . Cancer Other     colon cancer  . Hypertension Other   . Kidney disease Other   . Diabetes Other   . Heart disease  Mother     MI at age 14  . Cervical cancer Mother   . Cancer Mother   . Diabetes Mother   . Hypertension Mother   . Lung cancer Father 58    primary site lung CA then colon and bone  . Colon cancer Father   . Cancer Father   . Hypertension Father     Allergies as of 10/18/2012  . (No Known Allergies)    Current Outpatient Prescriptions on File Prior to Visit  Medication Sig Dispense Refill  . aspirin 81 MG tablet Take 81 mg by mouth daily.      . carvedilol (COREG) 3.125 MG tablet Take 1 tablet (3.125 mg total) by mouth 2 (two) times daily.  60 tablet  12  . furosemide (LASIX) 20 MG tablet Take 2 tablets (40 mg total) by mouth daily.  30 tablet  3  . metFORMIN (GLUCOPHAGE) 500 MG tablet  Take 1 tablet by mouth as needed.      . Multiple Vitamin (MULTIVITAMIN) tablet Take 1 tablet by mouth daily.      . Omega-3 Fatty Acids (FISH OIL) 1000 MG CAPS Take 1 each by mouth daily.      . potassium chloride (K-DUR) 10 MEQ tablet Take 2 tablets (20 mEq total) by mouth 2 (two) times daily.  30 tablet  3  . pravastatin (PRAVACHOL) 40 MG tablet Take 1 tablet (40 mg total) by mouth every evening.  30 tablet  11   No current facility-administered medications on file prior to visit.     REVIEW OF SYSTEMS:  Positives indicated with an "X"  CARDIOVASCULAR:  [ ]  chest pain   [ ]  chest pressure   [ ]  palpitations   [ ]  orthopnea   [ ]  dyspnea on exertion   [ ]  claudication   [ ]  rest pain   [ ]  DVT   [ ]  phlebitis PULMONARY:   [ ]  productive cough   [ ]  asthma   [ ]  wheezing NEUROLOGIC:   [ ]  weakness  [ ]  paresthesias  [ ]  aphasia  [ ]  amaurosis  [ ]  dizziness HEMATOLOGIC:   [ ]  bleeding problems   [ ]  clotting disorders MUSCULOSKELETAL:  [ ]  joint pain   [ ]  joint swelling GASTROINTESTINAL: [ ]   blood in stool  [ ]   hematemesis GENITOURINARY:  [ ]   dysuria  [x ]  hematuria PSYCHIATRIC:  [ ]  history of major depression INTEGUMENTARY:  [ ]  rashes  [ ]  ulcers CONSTITUTIONAL:  [ ]  fever   [ ]  chills  PHYSICAL EXAMINATION:  General: The patient is a well-nourished female, in no acute distress. Vital signs are BP 153/53  Pulse 72  Ht 5\' 2"  (1.575 m)  Wt 97 lb 6.4 oz (44.18 kg)  BMI 17.81 kg/m2  SpO2 100% Pulmonary: There is a good air exchange bilaterally without wheezing or rales. Abdomen: Soft and non-tender with normal pitch bowel sounds. No aneurysm is palpable Musculoskeletal: There are no major deformities.  There is no significant extremity pain. Neurologic: No focal weakness or paresthesias are detected, Skin: There are no ulcer or rashes noted. Psychiatric: The patient has normal affect. Cardiovascular: There is a regular rate and rhythm without significant murmur  appreciated. Carotid arteries without bruits bilaterally 2+ radial and 2+ femoral pulses. 1+ dorsalis pedis pulses bilaterally  Vascular Lab Studies: Severe carotid disease with velocities of 620/220 diastolic on the right internal carotid artery. Impression and Plan:  Severe right  and moderate to severe left carotid stenosis in the setting of coronary artery disease including coronary bypass grafting. I discussed this at length with the patient and her family present. I have recommended combined right carotid endarterectomy and coronary bypass grafting with Dr. Laneta Simmers. I explained the procedure and potential risk to include stroke with surgery at about 1-1-1/2% and also the very slight risk of cranial nerve injury. She understands and wishes to proceed with one-week coordinate this    EARLY, TODD [facility] [place] Office: [phone]

## 2012-11-14 NOTE — Op Note (Signed)
CARDIOVASCULAR SURGERY OPERATIVE NOTE  11/14/2012  Surgeon:  Alleen Borne, MD  First Assistant: Coral Ceo, [facility]   Preoperative Diagnosis:  Left main and severe multi-vessel coronary artery disease   Postoperative Diagnosis:  Same   Procedure:  1. Median Sternotomy 2. Extracorporeal circulation 3.   Coronary artery bypass grafting x 3   Left internal mammary graft to the LAD  SVG to OM  SVG to PDA  4.   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 She underwent TURBT by Dr. 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 She has been undergoing BCG treatments. She also developed some swelling in her feet and was treated for CHF. An echo showed and LVEF of 30% with severe LV dilatation. She was referred to Dr. Jens Som and underwent cath by Dr. Gala Romney recently showing moderate pulmonary HTN with low cardiac index, moderate LM and severe 3-vessel coronary disease with an EF of 25-30%. She has severe aorto-iliac disease. Carotid dopplers showed bilateral high grade carotid stenosis with 80-99% RICA (high end of range) and 59-80% (high end of range) LICA stenosis. She also was found to have a right lower lobe nodule on a CT of the abdomen in April and a CT of the chest on 10/06/2012 shows bilateral pulmonary nodules. She says she feels fine and denies any symptoms and is very active, still working as a home . She has significant LM and severe 3-vessel coronary artery disease with severe LV dysfunction presenting with mild lower extremity edema but no other symptoms. She also has high grade right internal carotid stenosis. With an EF  of 25-30% I think CABG is probably indicated to prevent further deterioration of cardiac function and progressive congestive heart failure. Dr. Arbie Cookey plans right carotid endarterectomy at the same time. She has severe aortoiliac disease that may preclude use of a IABP if needed. She has bilateral pulmonary nodules that are too small to biopsy at this time and I think a PET is probably premature at this point. She is at increased risk for lung cancer given her smoking history so these will require close followup with a repeat CT scan in 4-6 months. I discussed CABG surgery with her and her daughter including alternatives, benefits and risks. I answered all of their questions.    Preparation:  The patient was seen in the preoperative holding area and the correct patient, correct operation were confirmed with the patient after reviewing the medical record and catheterization. The consent was signed by me. Preoperative antibiotics were given. A pulmonary arterial line and radial arterial line were placed by the anesthesia team. The patient was taken back to the operating room and positioned supine on the operating room table. After being placed under general  endotracheal anesthesia by the anesthesia team a foley catheter was placed. The neck, chest, abdomen, and both legs were prepped with betadine soap and solution and draped in the usual sterile manner. A surgical time-out was taken and the correct patient and operative procedure were confirmed with the nursing and anesthesia staff.   Cardiopulmonary Bypass:  A median sternotomy was performed. The pericardium was opened in the midline. Right ventricular function appeared normal. The ascending aorta was of normal size and had no palpable plaque. There were no contraindications to aortic cannulation or cross-clamping. The patient was fully systemically heparinized and the ACT was maintained > 400 sec. The proximal aortic arch was cannulated with a 22 F aortic  cannula for arterial inflow. Venous cannulation was performed via the right atrial appendage using a two-staged venous cannula. An antegrade cardioplegia/vent cannula was inserted into the mid-ascending aorta. Aortic occlusion was performed with a single cross-clamp. Systemic cooling to 32 degrees Centigrade and topical cooling of the heart with iced saline were used. Hyperkalemic antegrade cold blood cardioplegia was used to induce diastolic arrest and was then given at about 20 minute intervals throughout the period of arrest to maintain myocardial temperature at or below 10 degrees centigrade. A temperature probe was inserted into the interventricular septum and an insulating pad was placed in the pericardium.   Left internal mammary harvest:  The left side of the sternum was retracted using the Rultract retractor. The left internal mammary artery was harvested as a pedicle graft. All side branches were clipped. It was a medium-sized vessel of good quality with excellent blood flow. It was ligated distally and divided. It was sprayed with topical papaverine solution to prevent vasospasm.   Endoscopic vein harvest:  We initially examined the right greater saphenous vein adjacent to the right knee and it was very small and not felt to be suitable. Therefore the left greater saphenous vein was harvested endoscopically through a 2 cm incision medial to the left knee. It was harvested from the upper thigh to below the knee. It was a small vein of good quality and looked a little larger than the right side. The side branches were all ligated with 4-0 silk ties.    Coronary arteries:  The coronary arteries were examined.   LAD:  Severe distal disease but the mid portion only had mild disease and was graftable  LCX:  OM branch was a medium size vessel with mild disease  RCA:  Diffusely and severely diseased with calcific plaque. The distal PDA was the only graftable location.   Grafts:  1. LIMA  to the LAD: 1.75 mm. It was sewn end to side using 8-0 prolene continuous suture. 2. SVG to OM:  1.6 mm. It was sewn end to side using 7-0 prolene continuous suture. 3. SVG to PDA:  1.6 mm. It was sewn end to side using 7-0 prolene continuous suture.  The proximal vein graft anastomoses were performed to the mid-ascending aorta using continuous 6-0 prolene suture. Graft markers were placed around the proximal anastomoses.   Completion:  The patient was rewarmed to 37 degrees Centigrade. The clamp was removed from the LIMA pedicle and there was rapid warming of the septum and return of ventricular fibrillation. The crossclamp was removed with a time of 66 minutes. There was spontaneous return of sinus rhythm. The distal and proximal anastomoses were checked for hemostasis. The position of the grafts was satisfactory. Two temporary epicardial pacing wires were placed on the right atrium and two  on the right ventricle. The patient was weaned from CPB without difficulty on no inotropes. CPB time was 84 minutes. Cardiac output was 4 LPM. Heparin was fully reversed with protamine and the aortic and venous cannulas removed. Hemostasis was achieved. Mediastinal and left pleural drainage tubes were placed. The sternum was closed with double #6 stainless steel wires. The fascia was closed with continuous # 1 vicryl suture. The subcutaneous tissue was closed with 2-0 vicryl continuous suture. The skin was closed with 3-0 vicryl subcuticular suture. All sponge, needle, and instrument counts were reported correct at the end of the case. Dry sterile dressings were placed over the incisions and around the chest tubes which were connected to pleurevac suction. The patient was then transported to the surgical intensive care unit in critical but stable condition.

## 2012-11-14 NOTE — Anesthesia Preprocedure Evaluation (Addendum)
Anesthesia Evaluation  Patient identified by MRN, date of birth, ID band Patient awake    Reviewed: Allergy & Precautions, H&P , NPO status , Patient's Chart, lab work & pertinent test results, reviewed documented beta blocker date and time   History of Anesthesia Complications Negative for: history of anesthetic complications  Airway Mallampati: I TM Distance: >3 FB Neck ROM: Full    Dental  (+) Edentulous Upper and Edentulous Lower   Pulmonary COPDCurrent Smoker,  breath sounds clear to auscultation        Cardiovascular + CAD (severe 3V disease), + Peripheral Vascular Disease (PAD) and +CHF (EF 25-30%) Rhythm:Regular Rate:Bradycardia     Neuro/Psych Bilateral carotid artery stenosis    GI/Hepatic   Endo/Other  diabetes, Well Controlled, Type 2, Oral Hypoglycemic Agents  Renal/GU      Musculoskeletal   Abdominal   Peds  Hematology   Anesthesia Other Findings   Reproductive/Obstetrics                          Anesthesia Physical Anesthesia Plan  ASA: IV  Anesthesia Plan: General   Post-op Pain Management:    Induction: Intravenous  Airway Management Planned: Oral ETT  Additional Equipment: Arterial line, CVP, PA Cath, 3D TEE and Ultrasound Guidance Line Placement  Intra-op Plan:   Post-operative Plan: Post-operative intubation/ventilation  Informed Consent: I have reviewed the patients History and Physical, chart, labs and discussed the procedure including the risks, benefits and alternatives for the proposed anesthesia with the patient or authorized representative who has indicated his/her understanding and acceptance.   Dental advisory given  Plan Discussed with: Anesthesiologist and Surgeon  Anesthesia Plan Comments: (R. Carotid stenosis 3V CAD with LV dysfunction EF 25-30% Bladder Ca  Plan GA with oral ETT and TEE  Kipp Brood, MD )      Anesthesia Quick  Evaluation

## 2012-11-14 NOTE — Interval H&P Note (Signed)
History and Physical Interval Note:  11/14/2012 7:20 AM  Tonya Valdez  has presented today for surgery, with the diagnosis of cad  The various methods of treatment have been discussed with the patient and family. After consideration of risks, benefits and other options for treatment, the patient has consented to  Procedure(s): CORONARY ARTERY BYPASS GRAFTING (CABG) (N/A) INTRAOPERATIVE TRANSESOPHAGEAL ECHOCARDIOGRAM (N/A) RIGHT ENDARTERECTOMY CAROTID (Right) as a surgical intervention .  The patient's history has been reviewed, patient examined, no change in status, stable for surgery.  I have reviewed the patient's chart and labs.  Questions were answered to the patient's satisfaction.     Alleen Borne

## 2012-11-14 NOTE — H&P (Signed)
Cardiothoracic Surgery Admission History and Physical  PCP is Oliver Barre, MD  Referring Provider is Arvilla Meres, MD  Chief Complaint   Patient presents with   .  Coronary Artery Disease     eval for CABG....referred by Dr. 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 She underwent TURBT by Dr. 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 She has been undergoing BCG treatments. She also developed some swelling in her feet and was treated for CHF. An echo showed and LVEF of 30% with severe LV dilatation. She was referred to Dr. Jens Som and underwent cath by Dr. Gala Romney recently showing moderate pulmonary HTN with low cardiac index, moderate LM and severe 3-vessel coronary disease with an EF of 25-30%. She has severe aorto-iliac disease. Carotid dopplers showed bilateral high grade carotid stenosis with 80-99% RICA (high end of range) and 59-80% (high end of range) LICA stenosis. She also was found to have a right lower lobe nodule on a CT of the abdomen in April and a CT of the chest on 10/06/2012 shows bilateral pulmonary nodules. She says she feels fine and denies any symptoms and is very active, still working as a Patent examiner.  Past Medical History   Diagnosis  Date   .  Positive TB test    .  Bladder tumor    .  COPD (chronic obstructive pulmonary disease)    .  Diabetes mellitus  TYPE II-- PER PCP NOTE DR Oliver Barre     PER PT DOES NOT TAKES METFORMIN PRESCRIBED, SHE WATCHES DIET AND EXERCISES   .  Congestive heart failure, unspecified  08/04/2012   .  Bilateral carotid artery disease  09/26/2012    Past Surgical History   Procedure  Laterality  Date   .  Tonsillectomy   1959   .  Hiatal hernia repair   1964 (AGE 67)     CHOLECYSTECTOMY AND APPENDECTOMY   .  Left elbow surgery     .  Lumbar disc  surgery   1993   .  Abdominal hysterectomy   1981 (APPROX)     BILATERAL SALPINGO-OOPHORECTOMY WITH EXCEPTION A SMALL PIECE OF OVARY REMAINS   .  Transurethral resection of bladder tumor  N/A  06/24/2012     Procedure: TRANSURETHRAL RESECTION OF BLADDER TUMOR WITH GYRUS AND INSTILLATION OF MITOMYCIN C (TURBT); Surgeon: Garnett Farm, MD; Location: [facility]; Service: Urology; Laterality: N/A;    Family History   Problem  Relation  Age of Onset   .  Cancer  Other      colon cancer   .  Heart disease  Mother      MI at age 65   .  Hypertension  Other    .  Kidney disease  Other    .  Diabetes  Other    .  Cervical cancer  Mother    .  Lung cancer  Father  82     primary site lung CA then colon and bone   .  Colon cancer  Father    Social History  History   Substance Use Topics   .  Smoking status:  Current Every Day Smoker -- 0.20 packs/day for 50 years     Types:  Cigarettes   .  Smokeless tobacco:  Never Used  Comment: PT HAS BEEN QUITTING-- SMOKES 4 CIG'S PER DAY, DOWN FROM 1PPD   .  Alcohol Use:  Yes      Comment: occasional wine not often    Current Outpatient Prescriptions   Medication  Sig  Dispense  Refill   .  aspirin 81 MG tablet  Take 81 mg by mouth daily.     .  carvedilol (COREG) 3.125 MG tablet  Take 1 tablet (3.125 mg total) by mouth 2 (two) times daily.  60 tablet  12   .  furosemide (LASIX) 20 MG tablet  Take 2 tablets (40 mg total) by mouth daily.  30 tablet  3   .  metFORMIN (GLUCOPHAGE) 500 MG tablet  Take 1 tablet by mouth as needed.     .  Multiple Vitamin (MULTIVITAMIN) tablet  Take 1 tablet by mouth daily.     .  Omega-3 Fatty Acids (FISH OIL) 1000 MG CAPS  Take 1 each by mouth daily.     .  potassium chloride (K-DUR) 10 MEQ tablet  Take 2 tablets (20 mEq total) by mouth 2 (two) times daily.  30 tablet  3   .  pravastatin (PRAVACHOL) 40 MG tablet  Take 1 tablet (40 mg total) by mouth every evening.  30 tablet  11    No current  facility-administered medications for this visit.   No Known Allergies  Review of Systems  Constitutional: Positive for unexpected weight change. Negative for fever, appetite change and fatigue.  Lost 25 lbs over the past year but says she has been watching her diet closely.  HENT: Negative.  Wears dentures.  Eyes: Negative.  Wears glasses  Respiratory: Negative for cough, chest tightness and shortness of breath.  Cardiovascular: Positive for leg swelling. Negative for chest pain and palpitations.  Had leg swelling but that has resolved with treatment.  Gastrointestinal: Negative.  Endocrine: Negative.  Genitourinary: Positive for hematuria.  Bladder cancer  No hematuria since TURBT  Musculoskeletal: Negative.  Skin: Negative.  Allergic/Immunologic: Negative.  Neurological: Negative.  Hematological: Negative.  Psychiatric/Behavioral: Negative.  BP 123/74  Pulse 79  Resp 16  Ht 5\' 2"  (1.575 m)  Wt 99 lb (44.906 kg)  BMI 18.1 kg/m2  SpO2 98%  Physical Exam  Constitutional: She is oriented to person, place, and time.  Thin WF in no distress.  HENT:  Head: Normocephalic and atraumatic.  Mouth/Throat: Oropharynx is clear and moist.  Eyes: Conjunctivae and EOM are normal. Pupils are equal, round, and reactive to light.  Neck: Normal range of motion. Neck supple. No JVD present. No tracheal deviation present. No thyromegaly present.  Bilateral cervical bruits  Cardiovascular: Normal rate and regular rhythm.  Murmur heard. 1/6 systolic murmur over aorta  Bilateral femoral bruits.  Pedal pulses palpable but diminished.  Pulmonary/Chest: Effort normal. She has no wheezes. She has no rales.  Distant breath sounds throughout  Abdominal: Soft. Bowel sounds are normal. She exhibits no distension and no mass. There is no tenderness.  Abdominal bruit over aorta  Musculoskeletal: Normal range of motion. She exhibits no edema and no tenderness.  Lymphadenopathy:  She has no cervical  adenopathy.  Neurological: She is alert and oriented to person, place, and time. She has normal strength. No cranial nerve deficit or sensory deficit.  Skin: Skin is warm and dry.  Psychiatric: She has a normal mood and affect.  Diagnostic Tests:   [facility] [address] [place], [place] [address] [phone]  ------------------------------------------------------------  Transthoracic Echocardiography  Patient: Tonya Valdez, Tonya Valdez MR #: [id number] Study Date: 08/17/2012 Gender: F Age: 28 Height: 157.5cm Weight: 47.6kg BSA: 1.46m^2 Pt. Status: Room:  SONOGRAPHER Luvenia Redden, RDCS ATTENDING Mariann Laster REFERRING Corwin Levins PERFORMING [facility] cc:  ------------------------------------------------------------ LV EF: 30%  ------------------------------------------------------------ Indications: Dyspnea 786.09.  ------------------------------------------------------------ History: PMH: Acquired from the patient and from the patient's chart. Dyspnea and bilateral lower extremity edema. Chronic obstructive pulmonary disease. Risk factors: Diabetes mellitus.  ------------------------------------------------------------ Study Conclusions  - Left ventricle: The cavity size was severely dilated. Wall thickness was normal. The estimated ejection fraction was 30%. Diffuse hypokinesis. - Mitral valve: Mild regurgitation. - Left atrium: The atrium was mildly dilated. - Right atrium: The atrium was mildly dilated. - Atrial septum: No defect or patent foramen ovale was identified. - Pulmonary arteries: PA peak pressure: 31mm Hg (S). Transthoracic echocardiography. M-mode, complete 2D, spectral Doppler, and color Doppler. Height: Height: 157.5cm. Height: 62in. Weight: Weight: 47.6kg. Weight: 104.8lb. Body mass index: BMI: 19.2kg/m^2. Body surface area: BSA: 1.60m^2. Blood pressure: 150/88. Patient status: Outpatient. Location:  [facility]  ------------------------------------------------------------  ------------------------------------------------------------ Left ventricle: The cavity size was severely dilated. Wall thickness was normal. The estimated ejection fraction was 30%. Diffuse hypokinesis.  ------------------------------------------------------------ Aortic valve: Mildly calcified leaflets.  ------------------------------------------------------------ Mitral valve: Doppler: Mild regurgitation. Peak gradient: 7mm Hg (D).  ------------------------------------------------------------ Left atrium: The atrium was mildly dilated.  ------------------------------------------------------------ Atrial septum: No defect or patent foramen ovale was identified.  ------------------------------------------------------------ Right ventricle: The cavity size was normal. Wall thickness was normal. Systolic function was normal.  ------------------------------------------------------------ Pulmonic valve: Doppler: Mild regurgitation.  ------------------------------------------------------------ Tricuspid valve: Doppler: Mild regurgitation.  ------------------------------------------------------------ Right atrium: The atrium was mildly dilated.  ------------------------------------------------------------ Pericardium: The pericardium was normal in appearance.  ------------------------------------------------------------  2D measurements Normal Doppler Normal Left ventricle measurements LVID ED, 49.5 mm 43-52 Main pulmonary chord, artery PLAX Pressure, S 31 mm =30 LVID ES, 44.4 mm 23-38 Hg chord, Left ventricle PLAX Ea, lat 4.61 cm/ ------- FS, chord, 10 % >29 ann, tiss s PLAX DP LVPW, ED 8.9 mm ------ E/Ea, lat 28.63 ------- IVS/LVPW 0.97 <1.3 ann, tiss ratio, ED DP Ventricular septum Ea, med 3.51 cm/ ------- IVS, ED 8.65 mm ------ ann, tiss s Aorta DP Root diam, 30 mm ------ E/Ea, med  37.61 ------- ED ann, tiss Left atrium DP AP dim 30 mm ------ LVOT AP dim 2.07 cm/m^2 <2.2 Peak vel, S 51.1 cm/ ------- index s VTI, S 8.89 cm ------- Mitral valve Peak E vel 132 cm/ ------- s Peak A vel 44.4 cm/ ------- s Deceleratio 155 ms 150-230 n time Peak 7 mm ------- gradient, D Hg Peak E/A 3 ------- ratio Regurg 23.1 cm/ ------- alias vel, s PISA Max regurg 499 cm/ ------- vel s Regurg VTI 148 cm ------- ERO, PISA 0.14 cm^ ------- 2 Regurg vol, 21 ml ------- PISA Tricuspid valve Regurg peak 255 cm/ ------- vel s Peak RV-RA 26 mm ------- gradient, S Hg Systemic veins Estimated 5 mm ------- CVP Hg Right ventricle Pressure, S 31 mm <30 Hg Sa vel, lat 9.1 cm/ ------- ann, tiss s DP  ------------------------------------------------------------ Prepared and Electronically Authenticated by  Charlton Haws 2014-06-25T15:13:[id number]     Cardiac Cath:  Findings:  RA = 5  RV = 55/6/7  PA = 60/22 (39)  PCW = 30/41/27  Fick cardiac output/index = 2.1/1.5  Thermo CO/CI = 3.0/2.1  PVR = 5.8  FA sat = 94%  PA  sat = 46% 50%  Ao Pressure: 150/59/96  LV Pressure: 154/33/20  There was no signficant gradient across the aortic valve on pullback.  Left main: Heavily calcified with 60% ostial lesion. Probably 40-50% distal  LAD: Heavily calcified throughout with probable 40-50% ostial lesion contiguous with ML plaque. Diffuse 40-50% throughout with 70-80% mid . Severe disease at the apex. Extensive septal arcade providing collaterals to distal RCA.  LCX:. Heavily caclified. Subtotal occlusion proximally. Mid LCX diffuse 80-90 lesion. Distal vessel fills with TIMI-1 flow  RCA: Totally occluded ostially. Mid to distal vessel fills well from LAD collaterals  L subclavian: 40% ostially with moderate diffuse disease  LV-gram done in the RAO projection: Ejection fraction = 25-30% diffuse HK  Abdomina aorta: Abdominal aorta is diffusely disease with severe ulcerated  plaquing. Both iliacs are diseased. R>L. The R iliac has ostial 95% lesion. Left renal long 60-70% lesion. Right ok.  Assessment:  1. Severe 3-v CAD including 60% LM lesion  2. Severe ischemic cardiomyopathy with low cardiac output  3. Severe PAD as described above  4. 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 COPD with ongoing tobacco use and pulmonary nodule  6. Recently discovered bilateral carotid stenosis > 90%  Plan/Discussion:  She has severe 3-V CAD (and PAD) with DM2 and significant LV dysfunction. Ideally will need CABG but I am not sure she will be a candidate due to comorbidities. Will increase lasix to 40mg  daily and add KCl 20 daily. Given that she is symptom-free, I think she is table for d/c home with close outpatient f/u. I have called for outpatient [facility] referral.    Impression/Plan:   She has significant LM and severe 3-vessel coronary artery disease with severe LV dysfunction presenting with mild lower extremity edema but no other symptoms. She also has high grade right internal carotid stenosis. With an EF of 25-30% I think CABG is probably indicated to prevent further deterioration of cardiac function and progressive congestive heart failure. Dr. Arbie Cookey plans right carotid endarterectomy at the same time. She has severe aortoiliac disease that may preclude use of a IABP if needed. She has bilateral pulmonary nodules that are too small to biopsy at this time and I think a PET is probably premature at this point. She is at increased risk for lung cancer given her smoking history so these will require close followup with a repeat CT scan in 4-6 months. I discussed CABG surgery with her and her daughter including alternatives, benefits and risks. I answered all of their questions. She would like to see the vascular surgeon and think about this a little more before deciding about surgery. I stressed the importance of quitting smoking. Her PFT's show a mild reversible obstructive defect  with a severe diffusion defect, although her room air ABG looks ok. She denies any dyspnea.

## 2012-11-14 NOTE — Anesthesia Postprocedure Evaluation (Signed)
  Anesthesia Post-op Note  Patient: Tonya Valdez  Procedure(s) Performed: Procedure(s): CORONARY ARTERY BYPASS GRAFTING (CABG) times three using left internal mammary artery and left saphenous vein (N/A) INTRAOPERATIVE TRANSESOPHAGEAL ECHOCARDIOGRAM (N/A) RIGHT ENDARTERECTOMY CAROTID with patch angioplasty. (Right)  Patient Location: SICU  Anesthesia Type:General  Level of Consciousness: sedated and Patient remains intubated per anesthesia plan  Airway and Oxygen Therapy: Patient remains intubated per anesthesia plan and Patient placed on Ventilator (see vital sign flow sheet for setting)  Post-op Pain: none  Post-op Assessment: Post-op Vital signs reviewed and Patient's Cardiovascular Status Stable  Post-op Vital Signs: stable  Complications: No apparent anesthesia complications

## 2012-11-15 ENCOUNTER — Inpatient Hospital Stay (HOSPITAL_COMMUNITY): Payer: Medicare Other

## 2012-11-15 ENCOUNTER — Encounter (HOSPITAL_COMMUNITY): Payer: Self-pay | Admitting: Surgery

## 2012-11-15 DIAGNOSIS — I2510 Atherosclerotic heart disease of native coronary artery without angina pectoris: Principal | ICD-10-CM

## 2012-11-15 LAB — BASIC METABOLIC PANEL
CO2: 23 mEq/L (ref 19–32)
Creatinine, Ser: 0.68 mg/dL (ref 0.50–1.10)
GFR calc Af Amer: >90 mL/min (ref >90)
GFR calc non Af Amer: 85 mL/min — ABNORMAL LOW (ref >90)
Glucose, Bld: 119 mg/dL — ABNORMAL HIGH (ref 70–99)
Potassium: 4.5 mEq/L (ref 3.5–5.1)
Sodium: 136 mEq/L (ref 135–145)

## 2012-11-15 LAB — CBC
HCT: 25.6 % — ABNORMAL LOW (ref 36.0–46.0)
Hemoglobin: 9.1 g/dL — ABNORMAL LOW (ref 12.0–15.0)
Hemoglobin: 9.1 g/dL — ABNORMAL LOW (ref 12.0–15.0)
MCH: 30.6 pg (ref 26.0–34.0)
MCV: 87.1 fL (ref 78.0–100.0)
RBC: 2.97 MIL/uL — ABNORMAL LOW (ref 3.87–5.11)
RBC: 2.94 MIL/uL — ABNORMAL LOW (ref 3.87–5.11)
RDW: 15 % (ref 11.5–15.5)
WBC: 15.3 10*3/uL — ABNORMAL HIGH (ref 4.0–10.5)

## 2012-11-15 LAB — CREATININE, SERUM
Creatinine, Ser: 0.66 mg/dL (ref 0.50–1.10)
GFR calc Af Amer: >90 mL/min (ref >90)

## 2012-11-15 LAB — GLUCOSE, CAPILLARY
Glucose-Capillary: 114 mg/dL — ABNORMAL HIGH (ref 70–99)
Glucose-Capillary: 117 mg/dL — ABNORMAL HIGH (ref 70–99)
Glucose-Capillary: 124 mg/dL — ABNORMAL HIGH (ref 70–99)
Glucose-Capillary: 127 mg/dL — ABNORMAL HIGH (ref 70–99)
Glucose-Capillary: 176 mg/dL — ABNORMAL HIGH (ref 70–99)
Glucose-Capillary: 63 mg/dL — ABNORMAL LOW (ref 70–99)
Glucose-Capillary: 127 mg/dL — ABNORMAL HIGH (ref 70–99)
Glucose-Capillary: 102 mg/dL — ABNORMAL HIGH (ref 70–99)
Glucose-Capillary: 127 mg/dL — ABNORMAL HIGH (ref 70–99)
Glucose-Capillary: 138 mg/dL — ABNORMAL HIGH (ref 70–99)
Glucose-Capillary: 142 mg/dL — ABNORMAL HIGH (ref 70–99)
Glucose-Capillary: 130 mg/dL — ABNORMAL HIGH (ref 70–99)

## 2012-11-15 LAB — POCT I-STAT, CHEM 8
BUN: 14 mg/dL (ref 6–23)
Creatinine, Ser: 0.8 mg/dL (ref 0.50–1.10)
Glucose, Bld: 128 mg/dL — ABNORMAL HIGH (ref 70–99)
Hemoglobin: 8.8 g/dL — ABNORMAL LOW (ref 12.0–15.0)
Potassium: 4.4 mEq/L (ref 3.5–5.1)
TCO2: 23 mmol/L (ref 0–100)

## 2012-11-15 LAB — MAGNESIUM
Magnesium: 2.4 mg/dL (ref 1.5–2.5)
Magnesium: 1.8 mg/dL (ref 1.5–2.5)

## 2012-11-15 MED ORDER — ENOXAPARIN SODIUM 30 MG/0.3ML ~~LOC~~ SOLN
30.0000 mg | Freq: Every day | SUBCUTANEOUS | Status: DC
  Administered 2012-11-15: 30 mg via SUBCUTANEOUS
  Filled 2012-11-15 (×2): qty 0.3

## 2012-11-15 MED ORDER — INSULIN ASPART 100 UNIT/ML ~~LOC~~ SOLN
0.0000 [IU] | SUBCUTANEOUS | Status: DC
  Administered 2012-11-16: 2 [IU] via SUBCUTANEOUS

## 2012-11-15 MED ORDER — DEXTROSE 50 % IV SOLN
INTRAVENOUS | Status: AC
  Administered 2012-11-15: 15 mL via INTRAVENOUS
  Filled 2012-11-15: qty 50

## 2012-11-15 NOTE — Progress Notes (Signed)
Brief Nutrition Note:  RD pulled to pt for health history, previously underweight. Pt denies unintentional weight loss or poor appetite PTA.  Wt Readings from Last 5 Encounters:  11/15/12 113 lb 1.5 oz (51.3 kg)  11/15/12 113 lb 1.5 oz (51.3 kg)  11/10/12 98 lb (44.453 kg)  11/09/12 95 lb (43.092 kg)  11/09/12 96 lb 4.8 oz (43.681 kg)   Weight hx shows weight gain.  Body mass index is 20.68 kg/(m^2). WNL  No nutrition interventions warranted at this time. Please consult as needed.   Isabell Jarvis RD, LDN Pager [phone] After Hours pager [phone]

## 2012-11-15 NOTE — Care Management Note (Signed)
    Page 1 of 1   11/15/2012     2:52:54 PM   CARE MANAGEMENT NOTE 11/15/2012  Patient:  Tonya Valdez, Tonya Valdez   Account Number:  [id number]  Date Initiated:  11/15/2012  Documentation initiated by:  Avie Arenas  Subjective/Objective Assessment:   Post op CABG     Action/Plan:   Anticipated DC Date:  11/21/2012   Anticipated DC Plan:  HOME W HOME HEALTH SERVICES      DC Planning Services  CM consult      Choice offered to / List presented to:             Status of service:  In process, will continue to follow Medicare Important Message given?   (If response is "NO", the following Medicare IM given date fields will be blank) Date Medicare IM given:   Date Additional Medicare IM given:    Discharge Disposition:    Per UR Regulation:  Reviewed for med. necessity/level of care/duration of stay  If discussed at Long Length of Stay Meetings, dates discussed:    Comments:  ContactFermin Schwab Daughter [phone]   [phone]                 [place], Kallio [phone]                 [place],Nancy Niece [phone]  11-15-12 10:45am Avie Arenas, RNBSN [phone] Patient very sleepy, however plans for discharge are to return with family.  Has arrangements made with large/multiple family members to go home with 24/7 coverage with family.   Sister in law and niece where in room at time and confirmed this also.

## 2012-11-15 NOTE — Progress Notes (Signed)
Subjective: Interval History: none..   Objective: Vital signs in last 24 hours: Temp:  [96.4 F (35.8 C)-98.8 F (37.1 C)] 98.8 F (37.1 C) (09/23 0600) Pulse Rate:  [72-91] 89 (09/23 0600) Resp:  [7-22] 7 (09/23 0600) BP: (75-119)/(41-64) 96/51 mmHg (09/23 0600) SpO2:  [96 %-100 %] 100 % (09/23 0600) Arterial Line BP: (64-126)/(37-57) 89/41 mmHg (09/23 0600) FiO2 (%):  [40 %-50 %] 40 % (09/22 1815) Weight:  [97 lb (44 kg)] 97 lb (44 kg) (09/22 1330)  Intake/Output from previous day: 09/22 0701 - 09/23 0700 In: 6755.1 [I.V.:4787.1; Blood:518; IV Piggyback:1450] Out: 3445 [Urine:1890; Drains:15; Blood:1050; Chest Tube:490] Intake/Output this shift: Total I/O In: 1723.6 [I.V.:1223.6; IV Piggyback:500] Out: 580 [Urine:330; Chest Tube:250]  Alert and oriented, comfortable, hemodynamically stable. Right neck dressing intact with no hematoma. Neurologically she is intact with equal grip strength bilaterally equal extremity strength bilaterally. Tongue is midline.  Lab Results:  Recent Labs  11/14/12 1945 11/14/12 2001 11/15/12 0400  WBC 14.1*  --  12.4*  HGB 9.7* 9.2* 9.1*  HCT 27.3* 27.0* 25.7*  PLT 139*  --  138*   BMET  Recent Labs  11/14/12 2001 11/15/12 0400  NA 137 136  K 4.5 4.5  CL 103 105  CO2  --  23  GLUCOSE 140* 119*  BUN 14 15  CREATININE 0.80 0.68  CALCIUM  --  7.7*    Studies/Results: Dg Chest 2 View  11/09/2012   CLINICAL DATA:  Pre-admission for surgery.  EXAM: CHEST  2 VIEW  COMPARISON:  CT, 10/06/2012 and chest radiograph, 08/04/2012  FINDINGS: The cardiac silhouette is normal in size and configuration. The mediastinum is normal in contour and caliber. There are no hilar masses.  There is a small healed granuloma in the right upper lobe, stable. The lungs are hyperexpanded. The lungs show reticular scarring most evident in the apices and right lateral lung base. No infiltrate or pulmonary edema is seen. No pleural effusion or pneumothorax.  The  bony thorax is demineralized but intact.  IMPRESSION: No acute cardiopulmonary disease.  COPD and lung scarring.   Electronically Signed   By: Amie Portland   On: 11/09/2012 14:08   Dg Chest Portable 1 View In Am  11/15/2012   CLINICAL DATA:  Coronary artery bypass grafting.  EXAM: PORTABLE CHEST - 1 VIEW  COMPARISON:  11/14/2012.  FINDINGS: Interval tracheal and esophageal extubation. Left IJ central catheters appears similar to prior, including Swan-Ganz catheter ending near the right main pulmonary artery. Thoracic drains remain. No evident pneumothorax. More hazy lower chest. Stable heart size status post CABG.  IMPRESSION: 1. Remaining tubes and lines in stable position. 2. Mild increase in lower lung atelectasis. 3. Trace biapical pneumothorax.   Electronically Signed   By: Tiburcio Pea   On: 11/15/2012 06:29   Dg Chest Portable 1 View  11/14/2012   CLINICAL DATA:  72 year old female status post cardiac surgery.  EXAM: PORTABLE CHEST - 1 VIEW  COMPARISON:  11/09/2012 and earlier.  FINDINGS: Portable AP view at 1404 hrs.  Bilateral chest tubes are in place. Mediastinal drain in place. No pneumothorax identified.  Endotracheal tube tip 10 mm above the carinal. Enteric tube courses to the left upper quadrant, side hole the level of the gastric fundus. Left IJ approach central venous catheter and Swan-Ganz catheter. Swan-Ganz tip at the level of the proximal right main pulmonary artery.  Normal cardiac size and mediastinal contours. Sequelae of CABG. No pulmonary edema. Increased apical and peripheral  pulmonary opacity, favor atelectasis. No definite pleural effusion. Epicardial pacer wires in place.  IMPRESSION: 1. Lines and tubes appear per bili placed as above.  2. No pneumothorax.  Mild atelectasis.   Electronically Signed   By: Augusto Gamble M.D.   On: 11/14/2012 14:16   Anti-infectives: Anti-infectives   Start     Dose/Rate Route Frequency Ordered Stop   11/14/12 1930  vancomycin (VANCOCIN) IVPB  1000 mg/200 mL premix     1,000 mg 200 mL/hr over 60 Minutes Intravenous  Once 11/14/12 1317 11/14/12 2100   11/14/12 1530  cefUROXime (ZINACEF) 1.5 g in dextrose 5 % 50 mL IVPB     1.5 g 100 mL/hr over 30 Minutes Intravenous Every 12 hours 11/14/12 1317 11/16/12 1529   11/14/12 0400  vancomycin (VANCOCIN) 1,000 mg in sodium chloride 0.9 % 250 mL IVPB     1,000 mg 250 mL/hr over 60 Minutes Intravenous To Surgery 11/13/12 1450 11/14/12 0810   11/14/12 0400  cefUROXime (ZINACEF) 1.5 g in dextrose 5 % 50 mL IVPB     1.5 g 100 mL/hr over 30 Minutes Intravenous To Surgery 11/13/12 1450 11/14/12 1155   11/14/12 0400  cefUROXime (ZINACEF) 750 mg in dextrose 5 % 50 mL IVPB  Status:  Discontinued     750 mg 100 mL/hr over 30 Minutes Intravenous To Surgery 11/13/12 1451 11/14/12 1317      Assessment/Plan: s/p Procedure(s): CORONARY ARTERY BYPASS GRAFTING (CABG) times three using left internal mammary artery and left saphenous vein (N/A) INTRAOPERATIVE TRANSESOPHAGEAL ECHOCARDIOGRAM (N/A) RIGHT ENDARTERECTOMY CAROTID with patch angioplasty. (Right) Able status post right carotid endarterectomy and coronary bypass grafting. Plan per Dr. Laneta Simmers   LOS: 1 day   Tonya Valdez 11/15/2012, 6:40 AM

## 2012-11-15 NOTE — Clinical Documentation Improvement (Signed)
THIS DOCUMENT IS NOT A PERMANENT PART OF THE MEDICAL RECORD  Please update your documentation with the medical record to reflect your response to this query. If you need help knowing how to do this please call [phone].  11/15/12  Dear Consepcion Hearing Marton Redwood  In an effort to better capture your patient's severity of illness, reflect appropriate length of stay and utilization of resources, a review of the patient medical record has revealed the following indicators.    Based on your clinical judgment, please clarify and document in a progress note and/or discharge summary the clinical condition associated with the following supporting information:    Possible Clinical Conditions?   " Expected Acute Blood Loss Anemia  " Acute Blood Loss Anemia  " Acute on chronic blood loss anemia  " Precipitous drop in Hematocrit  " Other Condition  " Cannot Clinically Determine     Risk Factors:  EBL: 1050 ml per 9/22 Anesthesia record.  Diagnostics: H&H on 9/17:  13.7/37.9 H&H on 9/22:   6.6/18.6  Transfusion: PRBC: 350 ml per 9/22 Anesthesia record.  IV fluids / plasma expanders: Per 9/22 Anesthesia record: Cell saver: 168 ml. LR:  3000 ml.  Reviewed: additional documentation in the medical record  Thank You,  Marciano Sequin,  Clinical Documentation Specialist: [phone] Health Information Management [place]

## 2012-11-15 NOTE — Progress Notes (Signed)
Patient ID: Tonya Valdez, female   DOB: July 27, 1940, 72 y.o.   MRN: [id number]  SICU Evening Rounds:  Hemodynamically stable  Ambulated and feels well  Continue routine postop course.

## 2012-11-15 NOTE — Progress Notes (Addendum)
1 Day Post-Op Procedure(s) (LRB): CORONARY ARTERY BYPASS GRAFTING (CABG) times three using left internal mammary artery and left saphenous vein (N/A) INTRAOPERATIVE TRANSESOPHAGEAL ECHOCARDIOGRAM (N/A) RIGHT ENDARTERECTOMY CAROTID with patch angioplasty. (Right) Subjective:  No complaints  Objective: Vital signs in last 24 hours: Temp:  [96.4 F (35.8 C)-98.8 F (37.1 C)] 98.6 F (37 C) (09/23 0700) Pulse Rate:  [72-91] 90 (09/23 0700) Cardiac Rhythm:  [-] Atrial paced (09/23 0400) Resp:  [7-22] 9 (09/23 0700) BP: (75-119)/(41-64) 92/46 mmHg (09/23 0700) SpO2:  [96 %-100 %] 98 % (09/23 0700) Arterial Line BP: (64-126)/(37-57) 105/42 mmHg (09/23 0700) FiO2 (%):  [40 %-50 %] 40 % (09/22 1815) Weight:  [44 kg (97 lb)-51.3 kg (113 lb 1.5 oz)] 51.3 kg (113 lb 1.5 oz) (09/23 0600)  Hemodynamic parameters for last 24 hours: PAP: (16-38)/(8-23) 38/19 mmHg CO:  [2.1 L/min-2.8 L/min] 2.8 L/min CI:  [1.5 L/min/m2-2 L/min/m2] 2 L/min/m2  Intake/Output from previous day: 09/22 0701 - 09/23 0700 In: 6860.1 [I.V.:4892.1; Blood:518; IV Piggyback:1450] Out: 3790 [Urine:2115; Drains:35; Blood:1050; Chest Tube:590] Intake/Output this shift:    General appearance: alert and cooperative Neurologic: intact Heart: regular rate and rhythm, S1, S2 normal, no murmur, click, rub or gallop Lungs: clear to auscultation bilaterally Extremities: extremities normal, atraumatic, no cyanosis or edema Wound: dressings dry  Lab Results:  Recent Labs  11/14/12 1945 11/14/12 2001 11/15/12 0400  WBC 14.1*  --  12.4*  HGB 9.7* 9.2* 9.1*  HCT 27.3* 27.0* 25.7*  PLT 139*  --  138*   BMET:  Recent Labs  11/14/12 2001 11/15/12 0400  NA 137 136  K 4.5 4.5  CL 103 105  CO2  --  23  GLUCOSE 140* 119*  BUN 14 15  CREATININE 0.80 0.68  CALCIUM  --  7.7*    PT/INR:  Recent Labs  11/14/12 1349  LABPROT 17.4*  INR 1.47   ABG    Component Value Date/Time   PHART 7.275* 11/14/2012 1956   HCO3  22.6 11/14/2012 1956   TCO2 21 11/14/2012 2001   ACIDBASEDEF 4.0* 11/14/2012 1956   O2SAT 97.0 11/14/2012 1956   CBG (last 3)   Recent Labs  11/15/12 0219 11/15/12 0320 11/15/12 0404  GLUCAP 104* 108* 118*    Assessment/Plan: S/P Procedure(s) (LRB): CORONARY ARTERY BYPASS GRAFTING (CABG) times three using left internal mammary artery and left saphenous vein (N/A) INTRAOPERATIVE TRANSESOPHAGEAL ECHOCARDIOGRAM (N/A) RIGHT ENDARTERECTOMY CAROTID with patch angioplasty. (Right) Postop vasodilation: wean neo as tolerated. Hold off on B-Blocker for now due to HR 60 and requiring neo. Mobilize Diuresis once off neo Diabetes control: preop Hgb A1c 7.7 d/c tubes/lines Continue foley due to patient in ICU and urinary output monitoring See progression orders   LOS: 1 day    BARTLE,BRYAN K 11/15/2012  CXR: clear  ECG: NSR 60, no acute changes  Expected acute blood loss anemia from surgery: observe

## 2012-11-16 ENCOUNTER — Inpatient Hospital Stay (HOSPITAL_COMMUNITY): Payer: Medicare Other

## 2012-11-16 DIAGNOSIS — J309 Allergic rhinitis, unspecified: Secondary | ICD-10-CM

## 2012-11-16 DIAGNOSIS — I2589 Other forms of chronic ischemic heart disease: Secondary | ICD-10-CM

## 2012-11-16 LAB — GLUCOSE, CAPILLARY
Glucose-Capillary: 146 mg/dL — ABNORMAL HIGH (ref 70–99)
Glucose-Capillary: 152 mg/dL — ABNORMAL HIGH (ref 70–99)
Glucose-Capillary: 136 mg/dL — ABNORMAL HIGH (ref 70–99)
Glucose-Capillary: 135 mg/dL — ABNORMAL HIGH (ref 70–99)
Glucose-Capillary: 135 mg/dL — ABNORMAL HIGH (ref 70–99)
Glucose-Capillary: 106 mg/dL — ABNORMAL HIGH (ref 70–99)

## 2012-11-16 LAB — BASIC METABOLIC PANEL
BUN: 17 mg/dL (ref 6–23)
Chloride: 98 mEq/L (ref 96–112)
GFR calc Af Amer: 79 mL/min — ABNORMAL LOW (ref >90)
GFR calc non Af Amer: 68 mL/min — ABNORMAL LOW (ref >90)
Glucose, Bld: 150 mg/dL — ABNORMAL HIGH (ref 70–99)
Potassium: 4.8 mEq/L (ref 3.5–5.1)

## 2012-11-16 LAB — CBC
HCT: 23.3 % — ABNORMAL LOW (ref 36.0–46.0)
Hemoglobin: 8.2 g/dL — ABNORMAL LOW (ref 12.0–15.0)
MCH: 30.7 pg (ref 26.0–34.0)
MCHC: 35.2 g/dL (ref 30.0–36.0)
WBC: 13.8 10*3/uL — ABNORMAL HIGH (ref 4.0–10.5)

## 2012-11-16 MED ORDER — FUROSEMIDE 10 MG/ML IJ SOLN
40.0000 mg | Freq: Once | INTRAMUSCULAR | Status: AC
  Administered 2012-11-16: 40 mg via INTRAVENOUS
  Filled 2012-11-16: qty 4

## 2012-11-16 MED ORDER — METOCLOPRAMIDE HCL 5 MG/ML IJ SOLN
10.0000 mg | Freq: Four times a day (QID) | INTRAMUSCULAR | Status: DC
  Administered 2012-11-16 (×2): 10 mg via INTRAVENOUS
  Filled 2012-11-16 (×5): qty 2

## 2012-11-16 MED ORDER — ACETAMINOPHEN 325 MG PO TABS
650.0000 mg | ORAL_TABLET | Freq: Four times a day (QID) | ORAL | Status: DC | PRN
  Administered 2012-11-19: 650 mg via ORAL

## 2012-11-16 MED ORDER — SODIUM CHLORIDE 0.9 % IJ SOLN: 3.0000 mL | INTRAMUSCULAR | Status: DC | PRN

## 2012-11-16 MED ORDER — INSULIN ASPART 100 UNIT/ML ~~LOC~~ SOLN
0.0000 [IU] | SUBCUTANEOUS | Status: DC
  Administered 2012-11-16 – 2012-11-18 (×6): 2 [IU] via SUBCUTANEOUS

## 2012-11-16 MED ORDER — BISACODYL 5 MG PO TBEC: 10.0000 mg | DELAYED_RELEASE_TABLET | Freq: Every day | ORAL | Status: DC | PRN

## 2012-11-16 MED ORDER — ASPIRIN EC 325 MG PO TBEC
325.0000 mg | DELAYED_RELEASE_TABLET | Freq: Every day | ORAL | Status: DC
  Administered 2012-11-17 – 2012-11-19 (×3): 325 mg via ORAL
  Filled 2012-11-16 (×4): qty 1

## 2012-11-16 MED ORDER — METOPROLOL TARTRATE 12.5 MG HALF TABLET
12.5000 mg | ORAL_TABLET | Freq: Two times a day (BID) | ORAL | Status: DC
  Filled 2012-11-16 (×2): qty 1

## 2012-11-16 MED ORDER — SODIUM CHLORIDE 0.9 % IJ SOLN
10.0000 mL | Freq: Two times a day (BID) | INTRAMUSCULAR | Status: DC
  Administered 2012-11-16: 20 mL via INTRAVENOUS

## 2012-11-16 MED ORDER — SODIUM CHLORIDE 0.9 % IV SOLN: 250.0000 mL | INTRAVENOUS | Status: DC | PRN

## 2012-11-16 MED ORDER — METOCLOPRAMIDE HCL 5 MG/ML IJ SOLN
10.0000 mg | Freq: Four times a day (QID) | INTRAMUSCULAR | Status: AC
  Administered 2012-11-16 – 2012-11-17 (×4): 10 mg via INTRAVENOUS
  Filled 2012-11-16 (×5): qty 2

## 2012-11-16 MED ORDER — ONDANSETRON HCL 4 MG/2ML IJ SOLN
4.0000 mg | Freq: Four times a day (QID) | INTRAMUSCULAR | Status: DC | PRN
  Administered 2012-11-16: 4 mg via INTRAVENOUS
  Filled 2012-11-16: qty 2

## 2012-11-16 MED ORDER — ONDANSETRON HCL 4 MG PO TABS: 4.0000 mg | ORAL_TABLET | Freq: Four times a day (QID) | ORAL | Status: DC | PRN

## 2012-11-16 MED ORDER — MOVING RIGHT ALONG BOOK
Freq: Once | Status: AC
  Administered 2012-11-16: 19:00:00
  Filled 2012-11-16: qty 1

## 2012-11-16 MED ORDER — BISACODYL 10 MG RE SUPP: 10.0000 mg | Freq: Every day | RECTAL | Status: DC | PRN

## 2012-11-16 MED ORDER — FAMOTIDINE 20 MG PO TABS
20.0000 mg | ORAL_TABLET | Freq: Two times a day (BID) | ORAL | Status: DC
  Administered 2012-11-16 – 2012-11-19 (×6): 20 mg via ORAL
  Filled 2012-11-16 (×8): qty 1

## 2012-11-16 MED ORDER — FUROSEMIDE 40 MG PO TABS
40.0000 mg | ORAL_TABLET | Freq: Every day | ORAL | Status: AC
  Administered 2012-11-17 – 2012-11-19 (×3): 40 mg via ORAL
  Filled 2012-11-16 (×3): qty 1

## 2012-11-16 MED ORDER — CARVEDILOL 3.125 MG PO TABS
3.1250 mg | ORAL_TABLET | Freq: Two times a day (BID) | ORAL | Status: DC
  Administered 2012-11-16 – 2012-11-19 (×6): 3.125 mg via ORAL
  Filled 2012-11-16 (×9): qty 1

## 2012-11-16 MED ORDER — POTASSIUM CHLORIDE CRYS ER 20 MEQ PO TBCR
20.0000 meq | EXTENDED_RELEASE_TABLET | Freq: Every day | ORAL | Status: DC
  Administered 2012-11-17 – 2012-11-19 (×3): 20 meq via ORAL
  Filled 2012-11-16 (×3): qty 1

## 2012-11-16 MED ORDER — SODIUM CHLORIDE 0.9 % IJ SOLN
3.0000 mL | Freq: Two times a day (BID) | INTRAMUSCULAR | Status: DC
  Administered 2012-11-16 – 2012-11-18 (×4): 3 mL via INTRAVENOUS

## 2012-11-16 MED ORDER — PROMETHAZINE HCL 25 MG/ML IJ SOLN
6.2500 mg | Freq: Once | INTRAMUSCULAR | Status: AC
  Administered 2012-11-16: 6.25 mg via INTRAVENOUS
  Filled 2012-11-16: qty 1

## 2012-11-16 MED ORDER — DOCUSATE SODIUM 100 MG PO CAPS
200.0000 mg | ORAL_CAPSULE | Freq: Every day | ORAL | Status: DC
  Administered 2012-11-17 – 2012-11-19 (×3): 200 mg via ORAL
  Filled 2012-11-16 (×3): qty 2

## 2012-11-16 MED ORDER — TRAMADOL HCL 50 MG PO TABS
50.0000 mg | ORAL_TABLET | ORAL | Status: DC | PRN
  Administered 2012-11-17 – 2012-11-18 (×3): 100 mg via ORAL
  Filled 2012-11-16 (×2): qty 2
  Filled 2012-11-16: qty 1
  Filled 2012-11-16: qty 2

## 2012-11-16 MED FILL — Magnesium Sulfate Inj 50%: INTRAMUSCULAR | Qty: 10 | Status: AC

## 2012-11-16 MED FILL — Heparin Sodium (Porcine) Inj 1000 Unit/ML: INTRAMUSCULAR | Qty: 30 | Status: AC

## 2012-11-16 MED FILL — Potassium Chloride Inj 2 mEq/ML: INTRAVENOUS | Qty: 40 | Status: AC

## 2012-11-16 MED FILL — Sodium Chloride IV Soln 0.9%: INTRAVENOUS | Qty: 1000 | Status: AC

## 2012-11-16 NOTE — Progress Notes (Signed)
[id number] Came to see pt to walk. Pt coming out of bathroom with family helping. Pt grabbed to sink and looked as if she was going to have loss of balance. Very weak. Helped pt to bed. Still nauseated. Stated has not walked today. Pale. Took vital signs 97 SR, 130/50 and sats 91-92%RA.  Told pt we would follow up tomorrow for ambulation. Call bell in reach. Oswin Johal DunlapRNBSN

## 2012-11-16 NOTE — Progress Notes (Signed)
   Subjective:  Denies CP or dyspnea; complains of nausea   Objective:  Filed Vitals:   11/16/12 0500 11/16/12 0600 11/16/12 0700 11/16/12 0720  BP: 131/65 123/61    Pulse:      Temp:    98.1 F (36.7 C)  TempSrc:    Oral  Resp: 14 16 20    Height:      Weight:   114 lb 10.2 oz (52 kg)   SpO2:   95%     Intake/Output from previous day:  Intake/Output Summary (Last 24 hours) at 11/16/12 0800 Last data filed at 11/16/12 0700  Gross per 24 hour  Intake 1238.73 ml  Output    565 ml  Net 673.73 ml    Physical Exam: Physical exam: Well-developed well-nourished in no acute distress.  Skin is warm and dry.  HEENT is normal.  Neck is supple.  Chest is clear to auscultation with normal expansion. S/p sternotomy Cardiovascular exam is regular rate and rhythm.  Abdominal exam nontender or distended. No masses palpated. Extremities show no edema. neuro grossly intact    Lab Results: Basic Metabolic Panel:  Recent Labs  16/10/96 0400 11/15/12 1700 11/15/12 1705 11/16/12 0330  NA 136  --  132* 131*  K 4.5  --  4.4 4.8  CL 105  --  97 98  CO2 23  --   --  23  GLUCOSE 119*  --  128* 150*  BUN 15  --  14 17  CREATININE 0.68 0.66 0.80 0.83  CALCIUM 7.7*  --   --  7.8*  MG 2.4 1.8  --   --    CBC:  Recent Labs  11/15/12 1700 11/15/12 1705 11/16/12 0330  WBC 15.3*  --  13.8*  HGB 9.1* 8.8* 8.2*  HCT 25.6* 26.0* 23.3*  MCV 87.1  --  87.3  PLT 125*  --  108*     Assessment/Plan:  1 S/P CABG - doing well; continue ASA and statin. 2 S/P CEA - management per vascular surgery 3 Postoperative volume excess - begin gentle diuresis 4 ICM - change lopressor to coreg; add lisinopril 2.5 mg daily later as BP stabilizes. Plan repeat echo in 3 months to see if LV function has improved following revascularization. 5 Hyperlipidemia - continue statin 6 PVD - will address following DC.  Olga Millers 11/16/2012, 8:00 AM

## 2012-11-16 NOTE — Plan of Care (Signed)
Problem: Phase III Progression Outcomes Goal: Time patient transferred to PCTU/Telemetry POD Outcome: Completed/Met Date Met:  11/16/12 Transferred to 2W26 via wheelchair with propak and belongings.  Dentures in Denture cup placed by bedside as well as glasses by bedside on table.  Vitals stable.  Family notified of bed and transfer.

## 2012-11-16 NOTE — Progress Notes (Addendum)
[address]       [name] [address]             [phone]      2 Days Post-Op Procedure(s) (LRB): CORONARY ARTERY BYPASS GRAFTING (CABG) times three using left internal mammary artery and left saphenous vein (N/A) INTRAOPERATIVE TRANSESOPHAGEAL ECHOCARDIOGRAM (N/A) RIGHT ENDARTERECTOMY CAROTID with patch angioplasty. (Right)  Subjective:  Tonya Valdez states she doesn't feel very well this morning.  She states she is nauseated and has vomited this morning.  She states she has been using Zofran which does provide some relief but so far she is unable to tolerate sips of gingerale.  No BM  Objective: Vital signs in last 24 hours: Temp:  [97.7 F (36.5 C)-98.7 F (37.1 C)] 98.1 F (36.7 C) (09/24 0720) Pulse Rate:  [75-92] 79 (09/24 0400) Cardiac Rhythm:  [-] Normal sinus rhythm (09/24 0700) Resp:  [7-24] 20 (09/24 0700) BP: (90-147)/(40-89) 123/61 mmHg (09/24 0600) SpO2:  [85 %-100 %] 95 % (09/24 0700) Arterial Line BP: (105-136)/(41-63) 136/63 mmHg (09/23 1100) Weight:  [114 lb 10.2 oz (52 kg)] 114 lb 10.2 oz (52 kg) (09/24 0700)  Hemodynamic parameters for last 24 hours: PAP: (34)/(15) 34/15 mmHg  Intake/Output from previous day: 09/23 0701 - 09/24 0700 In: 1278.7 [P.O.:320; I.V.:858.7; IV Piggyback:100] Out: 745 [Urine:625; Chest Tube:120]  General appearance: alert, cooperative and no distress Heart: regular rate and rhythm Lungs: clear to auscultation bilaterally Abdomen: soft, non-tender; bowel sounds normal; no masses,  no organomegaly Extremities: edema trace Wound: clean, some blood drainage from [facility] site  Lab Results:  Recent Labs  11/15/12 1700 11/15/12 1705 11/16/12 0330  WBC 15.3*  --  13.8*  HGB 9.1* 8.8* 8.2*  HCT 25.6* 26.0* 23.3*  PLT 125*  --  108*   BMET:  Recent Labs  11/15/12 0400  11/15/12 1705 11/16/12 0330  NA 136  --  132* 131*  K 4.5  --  4.4 4.8  CL 105  --  97 98  CO2 23  --   --  23  GLUCOSE  119*  --  128* 150*  BUN 15  --  14 17  CREATININE 0.68  < > 0.80 0.83  CALCIUM 7.7*  --   --  7.8*  < > = values in this interval not displayed.  PT/INR:  Recent Labs  11/14/12 1349  LABPROT 17.4*  INR 1.47   ABG    Component Value Date/Time   PHART 7.275* 11/14/2012 1956   HCO3 22.6 11/14/2012 1956   TCO2 23 11/15/2012 1705   ACIDBASEDEF 4.0* 11/14/2012 1956   O2SAT 97.0 11/14/2012 1956   CBG (last 3)   Recent Labs  11/15/12 1607 11/15/12 1943 11/16/12 0306  GLUCAP 119* 130* 152*    Assessment/Plan: S/P Procedure(s) (LRB): CORONARY ARTERY BYPASS GRAFTING (CABG) times three using left internal mammary artery and left saphenous vein (N/A) INTRAOPERATIVE TRANSESOPHAGEAL ECHOCARDIOGRAM (N/A) RIGHT ENDARTERECTOMY CAROTID with patch angioplasty. (Right)  1. CV- NSR rate in the 70s, SBP in the low 130s- will start low dose Beta Blocker 2. Pulm- off oxygen, + productive cough with clear sputum, +atelectasis encouraged use of IS 3. Renal- creatinine, lytes okay, volume overloaded will give Lasix today 4. GI- nausea, zofran helps, will start Reglan scheduled 5. DM- sugars controlled, continue regimen for now, patient not taking much PO, will restart home glucophage once tolerating diet 6. Acute Blood Loss Anemia- Hgb 8.2- will monitor, may benefit  from iron supplementation 7. Dispo- patient stable, off all drips, maintaining NSR, will keep in ICU till nausea improves and tolerating a diet   LOS: 2 days    Tonya Valdez 11/16/2012  Chart reviewed, patient examined, agree with above. She is doing well except for some nausea. Will transfer to 2000 CXR ok.

## 2012-11-16 NOTE — Progress Notes (Addendum)
[facility] Progress Note  11/16/2012 7:51 AM 2 Days Post-Op  Subjective:  Only complaint is nausea  afebrile HR 70-90 Atrially paced; regular 100-130's systolic 95% RA   Filed Vitals:   11/16/12 0720  BP:   Pulse:   Temp: 98.1 F (36.7 C)  Resp:      Physical Exam: Neuro:  In tact Incision:  C/d/i without hematoma  CBC    Component Value Date/Time   WBC 13.8* 11/16/2012 0330   RBC 2.67* 11/16/2012 0330   HGB 8.2* 11/16/2012 0330   HCT 23.3* 11/16/2012 0330   PLT 108* 11/16/2012 0330   MCV 87.3 11/16/2012 0330   MCH 30.7 11/16/2012 0330   MCHC 35.2 11/16/2012 0330   RDW 14.4 11/16/2012 0330   LYMPHSABS 3.5 09/26/2012 1536   MONOABS 0.7 09/26/2012 1536   EOSABS 0.3 09/26/2012 1536   BASOSABS 0.1 09/26/2012 1536    BMET    Component Value Date/Time   NA 131* 11/16/2012 0330   K 4.8 11/16/2012 0330   CL 98 11/16/2012 0330   CO2 23 11/16/2012 0330   GLUCOSE 150* 11/16/2012 0330   BUN 17 11/16/2012 0330   CREATININE 0.83 11/16/2012 0330   CALCIUM 7.8* 11/16/2012 0330   GFRNONAA 68* 11/16/2012 0330   GFRAA 79* 11/16/2012 0330     Intake/Output Summary (Last 24 hours) at 11/16/12 0751 Last data filed at 11/16/12 0700  Gross per 24 hour  Intake 1278.73 ml  Output    745 ml  Net 533.73 ml      Assessment/Plan:  This is a 72 y.o. female who is s/p right CEA/CABG 2 Days Post-Op  -pt is doing well this am with exception of nausea-she just received Zofran. -pt neuro exam is in tact -acute surgical blood loss anemia-transfuse per primary team -WBC's are improved this am -plan per Dr. Sharol Roussel, PA-C [facility] [phone] I have examined the patient, reviewed and agree with above.  Anairis Knick, MD 11/16/2012 1:02 PM

## 2012-11-17 ENCOUNTER — Telehealth: Payer: Self-pay | Admitting: Vascular Surgery

## 2012-11-17 LAB — GLUCOSE, CAPILLARY
Glucose-Capillary: 103 mg/dL — ABNORMAL HIGH (ref 70–99)
Glucose-Capillary: 118 mg/dL — ABNORMAL HIGH (ref 70–99)
Glucose-Capillary: 127 mg/dL — ABNORMAL HIGH (ref 70–99)
Glucose-Capillary: 94 mg/dL (ref 70–99)

## 2012-11-17 LAB — BASIC METABOLIC PANEL
CO2: 26 mEq/L (ref 19–32)
Chloride: 95 mEq/L — ABNORMAL LOW (ref 96–112)
GFR calc Af Amer: >90 mL/min (ref >90)
Potassium: 4.2 mEq/L (ref 3.5–5.1)

## 2012-11-17 LAB — CBC
HCT: 22.1 % — ABNORMAL LOW (ref 36.0–46.0)
MCV: 85.7 fL (ref 78.0–100.0)
Platelets: 123 10*3/uL — ABNORMAL LOW (ref 150–400)
RBC: 2.58 MIL/uL — ABNORMAL LOW (ref 3.87–5.11)
RDW: 13.9 % (ref 11.5–15.5)
WBC: 11.2 10*3/uL — ABNORMAL HIGH (ref 4.0–10.5)

## 2012-11-17 MED ORDER — LISINOPRIL 2.5 MG PO TABS
2.5000 mg | ORAL_TABLET | Freq: Every day | ORAL | Status: DC
  Administered 2012-11-17 – 2012-11-19 (×3): 2.5 mg via ORAL
  Filled 2012-11-17 (×3): qty 1

## 2012-11-17 NOTE — Progress Notes (Signed)
RIJ D/C'd per order. Occlusive and pressure applied. Pt instructed to remain in bed for 30 minutes. Verbalized understanding. Will continue to monitor pt closely.

## 2012-11-17 NOTE — Progress Notes (Signed)
Patient ID: Tonya Valdez, female   DOB: 09-May-1940, 72 y.o.   MRN: [id number] Looks great. Now on 2 west. Eating breakfast without difficulty. Right neck incision healing nicely. Grossly intact neurologically. Will not follow actively while in the hospital. The scallop we can assist. Will see in the office in 2-3 weeks.

## 2012-11-17 NOTE — Progress Notes (Signed)
Anesthesiology Follow-up:  Awake and alert, neuro intact, minimal pain, sitting up in bed.  VS: T-37.1 BP -114/48 HR 74(SR) RR 16 O2 Sat 91% on RA  K-4.8 BUN/Cr-17/0.83 H/H- 8.1/22.1 Platelets 123,000  Extubated 6 hours post-op  72 y/o female with 3V CAD, moderate LV dysfunction, bilateral carotid stenoses, COPD, and bladder Ca underwent combined CABGx3 and R. Carotid endarectomy on 9/22 without apparent complications.  Kipp Brood, MD

## 2012-11-17 NOTE — Progress Notes (Signed)
CARDIAC REHAB PHASE I   PRE:  Rate/Rhythm: 76 SR  BP:  Supine:   Sitting: 106/40  Standing:    SaO2: 92 RA  MODE:  Ambulation: 350 ft   POST:  Rate/Rhythm: 90  BP:  Supine:   Sitting: 92/40  Standing:     SaO2: 94 RA 1015-1040 Assisted X 1 with hand held assist to ambulate. Gait steady. Pt took one standing rest stop in hall, felt tired. BP after walk 92/40. Encouraged pt to increase her fluid intake. Pt to recliner after walk with call light in reach.  Melina Copa RN 11/17/2012 10:40 AM

## 2012-11-17 NOTE — Progress Notes (Addendum)
[address]       [name] [address]             [phone]          3 Days Post-Op Procedure(s) (LRB): CORONARY ARTERY BYPASS GRAFTING (CABG) times three using left internal mammary artery and left saphenous vein (N/A) INTRAOPERATIVE TRANSESOPHAGEAL ECHOCARDIOGRAM (N/A) RIGHT ENDARTERECTOMY CAROTID with patch angioplasty. (Right)  Subjective: Feels much better today.  Nausea resolved and eating breakfast.  No complaints.   Objective: Vital signs in last 24 hours: Patient Vitals for the past 24 hrs:  BP Temp Temp src Pulse Resp SpO2 Weight  11/17/12 0418 114/48 mmHg 98.7 F (37.1 C) Oral 74 16 91 % 110 lb 4.8 oz (50.032 kg)  11/16/12 2000 128/67 mmHg 99 F (37.2 C) Oral 77 18 92 % -  11/16/12 1858 133/74 mmHg - - 75 - - -  11/16/12 1412 140/60 mmHg 99.2 F (37.3 C) Oral - 16 93 % -  11/16/12 1300 153/60 mmHg - - - 15 - -  11/16/12 1200 137/64 mmHg - - 74 18 92 % -  11/16/12 1131 - 98.9 F (37.2 C) Oral - - - -  11/16/12 1100 - - - - 18 - -  11/16/12 1000 - - - - 14 - -  11/16/12 0900 - - - - 16 - -  11/16/12 0830 120/93 mmHg - - - 13 92 % -  11/16/12 0800 - - - - 13 - -   Current Weight  11/17/12 110 lb 4.8 oz (50.032 kg)   PRE-OPERATIVE WEIGHT: 44 kg   Intake/Output from previous day: 09/24 0701 - 09/25 0700 In: 306 [P.O.:240; I.V.:66] Out: 1000 [Urine:1000]  CBGs [id number]   PHYSICAL EXAM:  Heart: RRR Lungs: Clear Wound: Clean and dry Extremities: No significant edema Neuro: intact    Lab Results: CBC: Recent Labs  11/15/12 1700 11/15/12 1705 11/16/12 0330  WBC 15.3*  --  13.8*  HGB 9.1* 8.8* 8.2*  HCT 25.6* 26.0* 23.3*  PLT 125*  --  108*   BMET:  Recent Labs  11/15/12 0400  11/15/12 1705 11/16/12 0330  NA 136  --  132* 131*  K 4.5  --  4.4 4.8  CL 105  --  97 98  CO2 23  --   --  23  GLUCOSE 119*  --  128* 150*  BUN 15  --  14 17  CREATININE 0.68  < > 0.80 0.83  CALCIUM 7.7*  --   --  7.8*  <  > = values in this interval not displayed.  PT/INR:  Recent Labs  11/14/12 1349  LABPROT 17.4*  INR 1.47      Assessment/Plan: S/P Procedure(s) (LRB): CORONARY ARTERY BYPASS GRAFTING (CABG) times three using left internal mammary artery and left saphenous vein (N/A) INTRAOPERATIVE TRANSESOPHAGEAL ECHOCARDIOGRAM (N/A) RIGHT ENDARTERECTOMY CAROTID with patch angioplasty. (Right)  CV- maintaining SR, BPs trending up. Continue Coreg, will add low dose ACE-I and monitor.  DM- sugars remain low off meds.  Will continue to hold Metformin until po intake improves.  Vol overload- diurese.  GI- improved. Continue Reglan, nausea meds prn.  CRPI, ambulation.  Expected postop blood loss anemia- continue to monitor. Holding off on iron due to GI upset.  Possibly home by the weekend- she prefers to wait until Saturday is possible.  Will d/c central line.   LOS: 3 days    COLLINS,GINA H 11/17/2012  Chart reviewed, patient examined, agree with above. She can go home Saturday if no changes.

## 2012-11-17 NOTE — Telephone Encounter (Addendum)
Message copied by Fredrich Birks on Thu Nov 17, 2012  4:38 PM ------      Message from: Melene Plan      Created: Thu Nov 17, 2012  9:35 AM                   ----- Message -----         From: Lars Mage, PA-C         Sent: 11/17/2012   7:40 AM           To: Melene Plan, RN            F/U 2 weeks with Dr. Arbie Cookey carotid ------  Spoke with patient, dpm

## 2012-11-18 LAB — TYPE AND SCREEN: Unit division: 0

## 2012-11-18 LAB — GLUCOSE, CAPILLARY
Glucose-Capillary: 105 mg/dL — ABNORMAL HIGH (ref 70–99)
Glucose-Capillary: 121 mg/dL — ABNORMAL HIGH (ref 70–99)
Glucose-Capillary: 121 mg/dL — ABNORMAL HIGH (ref 70–99)
Glucose-Capillary: 139 mg/dL — ABNORMAL HIGH (ref 70–99)
Glucose-Capillary: 148 mg/dL — ABNORMAL HIGH (ref 70–99)

## 2012-11-18 NOTE — Discharge Summary (Signed)
[address]       [name] [address]             [phone]              Discharge Summary  Name: Tonya Valdez DOB: 07/01/1940 72 y.o. MRN: [id number]   Admission Date: 11/14/2012 Discharge Date:     Admitting Diagnosis: Left main and severe three vessel coronary artery disease Severe asymptomatic right carotid stenosis   Discharge Diagnosis:  Left main and severe three vessel coronary artery disease Severe asymptomatic right carotid stenosis Bilateral pulmonary nodules Expected postop blood loss anemia  Past Medical History  Diagnosis Date  . Positive TB test   . Bladder tumor   . COPD (chronic obstructive pulmonary disease)   . Diabetes mellitus TYPE II--  PER PCP NOTE DR Oliver Barre    PER PT DOES NOT TAKES METFORMIN PRESCRIBED, SHE WATCHES DIET AND EXERCISES  . Congestive heart failure, unspecified 08/04/2012  . Bilateral carotid artery disease 09/26/2012  . CAD (coronary artery disease)   . Cancer     bladder  . H/O hiatal hernia     s/p  72 yrs old      Procedures:  CORONARY ARTERY BYPASS GRAFTING x 3 (Left internal mammary artery to left anterior descending, saphenous vein graft to obtuse marginal, saphenous vein graft to posterior descending)  ENDOSCOPIC VEIN HARVEST LEFT THIGH  RIGHT CAROTID ENDARTERECTOMY WITH DACRON PATCH ANGIOPLASTY - 11/14/2012    HPI:  The patient is a 72 y.o. female who was diagnosed with bladder tumors after presenting with gross hematuria in April 2014. She underwent TURBT by Dr. 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 She has been undergoing BCG treatments. She also developed some swelling in her feet and was treated for CHF. An echo showed and LVEF of 30% with severe LV dilatation. She was referred to Dr. Jens Som and underwent cath by Dr. Gala Romney recently showing moderate pulmonary hypertension with low cardiac index, moderate left main and  severe 3-vessel coronary disease with an EF of 25-30%. She has severe aorto-iliac disease. Carotid dopplers showed bilateral high grade carotid stenosis with 80-99% RICA (high end of range) and 59-80% (high end of range) LICA stenosis. She also was found to have a right lower lobe nodule on a CT of the abdomen in April and a CT of the chest on 10/06/2012 shows bilateral pulmonary nodules. She says she feels fine and denies any symptoms and is very active, still working as a [employer] examiner.  She was referred to Dr. Laneta Simmers for surgical consideration.  He recommended proceeding with combined CABG/right carotid endarterectomy.  It was also recommended that she undergo a repeat chest CT in 4-6 months to follow up on her pulmonary nodules, as they were felt to be too small to biopsy at present. She was evaluated by Dr. Arbie Cookey for a vascular surgery opinion, and he agreed with Dr. Sharee Pimple recommendation.  All risks, benefits and alternatives of surgery were explained in detail, and the patient agreed to proceed.    Hospital Course:  The patient was admitted to [facility] on 11/14/2012. The patient was taken to the operating room and underwent the above procedure.    The postoperative course was notable for GI upset which was managed conservatively and has resolved.  She presently is tolerating a regular diet without problem. Her blood sugars have been low normal, and she has not yet been  restarted on Metformin.  We anticipate resuming this at discharge.  Her preop hemoglobin A1C was 7.7.    She has otherwise progressed well.  She remains in sinus rhythm, and her vital signs are stable.  She has had an expected postop blood loss anemia which has not required transfusion.  She is neurologically intact, and all surgical incisions are healing well. She is ambulating in the halls with cardiac rehab and is progressing well with mobility. She has been evaluated on today's date, and is stable for discharge  home.     Recent vital signs:  Filed Vitals:   11/18/12 0500  BP: 133/58  Pulse: 70  Temp: 98.1 F (36.7 C)  Resp: 18    Recent laboratory studies:  CBC: Recent Labs  11/16/12 0330 11/17/12 0745  WBC 13.8* 11.2*  HGB 8.2* 8.1*  HCT 23.3* 22.1*  PLT 108* 123*   BMET:  Recent Labs  11/16/12 0330 11/17/12 0745  NA 131* 128*  K 4.8 4.2  CL 98 95*  CO2 23 26  GLUCOSE 150* 100*  BUN 17 18  CREATININE 0.83 0.79  CALCIUM 7.8* 8.2*    PT/INR: No results found for this basename: LABPROT, INR,  in the last 72 hours   Discharge Medications:     The patient has been discharged on:  1.Beta Blocker: Yes [ x ]  No [ ]   If No, reason:    2.Ace Inhibitor/ARB: Yes [ x ]  No [  ]  If No, reason:    3.Statin: Yes [ x ]  No [ ]   If No, reason:    4.Ecasa: Yes [ x  ]  No [ ]   If No, reason:     Medication List    STOP taking these medications       aspirin 81 MG tablet  Replaced by:  aspirin 325 MG EC tablet      TAKE these medications       ACCU-CHEK FASTCLIX LANCETS Misc  USE TO CHECK BLOOD SUGAR FOUR TIMES DAILY     ACCU-CHEK SMARTVIEW test strip  Generic drug:  glucose blood  USE TO CHECK BLOOD SUGAR FOUR TIMES DAILY AS DIRECTED     aspirin 325 MG EC tablet  Take 1 tablet (325 mg total) by mouth daily.     carvedilol 3.125 MG tablet  Commonly known as:  COREG  Take 1 tablet (3.125 mg total) by mouth 2 (two) times daily with a meal.     Fish Oil 1000 MG Caps  Take 1 each by mouth daily.     furosemide 20 MG tablet  Commonly known as:  LASIX  Take 2 tablets (40 mg total) by mouth daily.     lisinopril 2.5 MG tablet  Commonly known as:  PRINIVIL,ZESTRIL  Take 1 tablet (2.5 mg total) by mouth daily.     metFORMIN 500 MG tablet  Commonly known as:  GLUCOPHAGE  Take 1 tablet by mouth 2 (two) times daily with a meal.     multivitamin tablet  Take 1 tablet by mouth daily.     potassium chloride 10 MEQ tablet  Commonly known as:   K-DUR  Take 2 tablets (20 mEq total) by mouth 2 (two) times daily.     pravastatin 40 MG tablet  Commonly known as:  PRAVACHOL  Take 1 tablet (40 mg total) by mouth every evening.     traMADol 50 MG tablet  Commonly known as:  Janean Sark  Take 1-2 tablets (50-100 mg total) by mouth every 4 (four) hours as needed.       Discharge Instructions:  The patient is to refrain from driving, heavy lifting or strenuous activity.  May shower daily and clean incisions with soap and water.  May resume regular diet.   Follow Up:       Future Appointments Provider Department Dept Phone   12/06/2012 9:15 AM Larina Earthly, MD [facility] [phone]   04/11/2013 2:45 PM Corwin Levins, MD [facility] [phone]      Follow-up Information   Follow up with EARLY, TODD, MD In 2 weeks. (Office will arrange appointment)    Specialty:  Vascular Surgery   Contact information:   [address] [place] [address] [phone]       Follow up with Alleen Borne, MD In 3 weeks. (Office will arrange appointment)    Specialty:  Cardiothoracic Surgery   Contact information:   [address] [place] [address] [phone]       Follow up with Arvilla Meres, MD. Schedule an appointment as soon as possible for a visit in 2 weeks.   Specialty:  Cardiology   Contact information:   [address] [place] [address] [phone]       Adella Hare 11/18/2012, 8:24 AM

## 2012-11-18 NOTE — Progress Notes (Signed)
CARDIAC REHAB PHASE I   PRE:  Rate/Rhythm: 73 SR  BP:  Supine:   Sitting: 100/42  Standing:    SaO2: 94 RA  MODE:  Ambulation: 550 ft   POST:  Rate/Rhythm: 92  BP:  Supine:   Sitting: 120/50  Standing:    SaO2: 95 RA 0940-1035 Pt tolerated ambulation well without c/o. Gait steady VS stable. Pt to side of bed after walk with call light in reach. Completed discharge education with pt. She voices understanding. Discussed smoking cessation with pt, she states that she quit 11/07/12. Gave her tips for quitting and coaching contact number. We discussed relapse and ways to avoid that.Pt agrees to CRP in [place], will send referral.  Melina Copa RN 11/18/2012 10:33 AM

## 2012-11-18 NOTE — Progress Notes (Addendum)
       [address]       [place] [address]             [phone]          4 Days Post-Op Procedure(s) (LRB): CORONARY ARTERY BYPASS GRAFTING (CABG) times three using left internal mammary artery and left saphenous vein (N/A) INTRAOPERATIVE TRANSESOPHAGEAL ECHOCARDIOGRAM (N/A) RIGHT ENDARTERECTOMY CAROTID with patch angioplasty. (Right)  Subjective: Doing well, no complaints.  No further nausea.  Improved po intake.  No complaints.   Objective: Vital signs in last 24 hours: Patient Vitals for the past 24 hrs:  BP Temp Temp src Pulse Resp SpO2 Weight  11/18/12 0500 133/58 mmHg 98.1 F (36.7 C) Oral 70 18 93 % 109 lb 12.6 oz (49.8 kg)  11/17/12 1934 116/57 mmHg 98.4 F (36.9 C) Oral 71 18 95 % -  11/17/12 1841 103/74 mmHg - - 77 - - -  11/17/12 1443 137/68 mmHg 98.1 F (36.7 C) Oral 67 17 96 % -  11/17/12 1057 109/49 mmHg - - - - - -   Current Weight  11/18/12 109 lb 12.6 oz (49.8 kg)  PRE-OPERATIVE WEIGHT: 44 kg    Intake/Output from previous day: 09/25 0701 - 09/26 0700 In: 600 [P.O.:600] Out: 1200 [Urine:1200]  CBGs 107-118-121-119   PHYSICAL EXAM:  Heart: RRR Lungs: Clear Wound: Clean and dry Extremities: No significant LE edema     Lab Results: CBC: Recent Labs  11/16/12 0330 11/17/12 0745  WBC 13.8* 11.2*  HGB 8.2* 8.1*  HCT 23.3* 22.1*  PLT 108* 123*   BMET:  Recent Labs  11/16/12 0330 11/17/12 0745  NA 131* 128*  K 4.8 4.2  CL 98 95*  CO2 23 26  GLUCOSE 150* 100*  BUN 17 18  CREATININE 0.83 0.79  CALCIUM 7.8* 8.2*    PT/INR: No results found for this basename: LABPROT, INR,  in the last 72 hours    Assessment/Plan: S/P Procedure(s) (LRB): CORONARY ARTERY BYPASS GRAFTING (CABG) times three using left internal mammary artery and left saphenous vein (N/A) INTRAOPERATIVE TRANSESOPHAGEAL ECHOCARDIOGRAM (N/A) RIGHT ENDARTERECTOMY CAROTID with patch angioplasty. (Right)  CV- maintaining SR, BPs stable.  Continue Coreg, Lisinopril.  DM- sugars stable off meds. A1C= 7.7. Will plan to resume Metformin at discharge.  Vol overload- diurese.   CRPI, will d/c EPWs today.   Home in am if no changes.    LOS: 4 days    COLLINS,GINA H 11/18/2012   Chart reviewed, patient examined, agree with above.

## 2012-11-19 LAB — GLUCOSE, CAPILLARY: Glucose-Capillary: 88 mg/dL (ref 70–99)

## 2012-11-19 MED ORDER — ASPIRIN 325 MG PO TBEC: 325.0000 mg | DELAYED_RELEASE_TABLET | Freq: Every day | ORAL | Status: DC

## 2012-11-19 MED ORDER — CARVEDILOL 3.125 MG PO TABS: 3.1250 mg | ORAL_TABLET | Freq: Two times a day (BID) | ORAL | Status: DC

## 2012-11-19 MED ORDER — TRAMADOL HCL 50 MG PO TABS: 50.0000 mg | ORAL_TABLET | ORAL | Status: DC | PRN

## 2012-11-19 NOTE — Progress Notes (Signed)
IV and tele monitor d/c at this time; pt given d/c instructions; pt verbalized understanding; pt awaiting ride home to arrive; will cont. To monitor.

## 2012-11-19 NOTE — Progress Notes (Signed)
      [address]       [place] [address]             [phone]      5 Days Post-Op Procedure(s) (LRB): CORONARY ARTERY BYPASS GRAFTING (CABG) times three using left internal mammary artery and left saphenous vein (N/A) INTRAOPERATIVE TRANSESOPHAGEAL ECHOCARDIOGRAM (N/A) RIGHT ENDARTERECTOMY CAROTID with patch angioplasty. (Right)  Subjective:  Tonya Valdez feels great this morning.  She states she is ready to go home. + BM  Objective: Vital signs in last 24 hours: Temp:  [97.6 F (36.4 C)-98.3 F (36.8 C)] 97.6 F (36.4 C) (09/27 0423) Pulse Rate:  [62-74] 63 (09/27 0423) Cardiac Rhythm:  [-] Normal sinus rhythm;Sinus bradycardia (09/27 0757) Resp:  [18] 18 (09/27 0423) BP: (121-132)/(51-64) 132/64 mmHg (09/27 0423) SpO2:  [95 %-98 %] 97 % (09/27 0423) Weight:  [108 lb 8 oz (49.215 kg)] 108 lb 8 oz (49.215 kg) (09/27 0423)  Intake/Output from previous day: 09/26 0701 - 09/27 0700 In: 720 [P.O.:720] Out: 2400 [Urine:2400]   General appearance: alert, cooperative and no distress Neurologic: intact Heart: regular rate and rhythm Lungs: clear to auscultation bilaterally Abdomen: soft, non-tender; bowel sounds normal; no masses,  no organomegaly Extremities: edema trace Wound: clean, some serous drainage from [facility] site on Right  Lab Results:  Recent Labs  11/17/12 0745  WBC 11.2*  HGB 8.1*  HCT 22.1*  PLT 123*   BMET:  Recent Labs  11/17/12 0745  NA 128*  K 4.2  CL 95*  CO2 26  GLUCOSE 100*  BUN 18  CREATININE 0.79  CALCIUM 8.2*    PT/INR: No results found for this basename: LABPROT, INR,  in the last 72 hours ABG    Component Value Date/Time   PHART 7.275* 11/14/2012 1956   HCO3 22.6 11/14/2012 1956   TCO2 23 11/15/2012 1705   ACIDBASEDEF 4.0* 11/14/2012 1956   O2SAT 97.0 11/14/2012 1956   CBG (last 3)   Recent Labs  11/18/12 2011 11/19/12 0022 11/19/12 0426  GLUCAP 139* 88 118*    Assessment/Plan: S/P Procedure(s)  (LRB): CORONARY ARTERY BYPASS GRAFTING (CABG) times three using left internal mammary artery and left saphenous vein (N/A) INTRAOPERATIVE TRANSESOPHAGEAL ECHOCARDIOGRAM (N/A) RIGHT ENDARTERECTOMY CAROTID with patch angioplasty. (Right)  1. CV- NSR, hemodynamically stable, continue Coreg, Lisinopril 2. Pulm- no issues, encouraged continued use of IS at discharge 3. DM- resume Metformin at discharge 4. Renal- remains volume overloaded, continue diuresis at discharge 5. Dispo- patient doing very well, will discharge home today    LOS: 5 days    Raford Pitcher, Denny Peon 11/19/2012

## 2012-11-19 NOTE — Progress Notes (Signed)
  VASCULAR SURGERY CAROTID DAILY PROGRESS NOTE   5 Days Post-Op right CEA and CABG  SUBJECTIVE: pt doing well. Ambulating. No C/O.  PHYSICAL EXAM: BP Readings from Last 3 Encounters:  11/19/12 132/64  11/19/12 132/64  11/10/12 142/62   Temp Readings from Last 3 Encounters:  11/19/12 97.6 F (36.4 C) Oral  11/19/12 97.6 F (36.4 C) Oral  11/09/12 97.9 F (36.6 C)    Pulse Readings from Last 3 Encounters:  11/19/12 63  11/19/12 63  11/10/12 66   SpO2 Readings from Last 3 Encounters:  11/19/12 97%  11/19/12 97%  11/10/12 98%     Intake/Output Summary (Last 24 hours) at 11/19/12 5784 Last data filed at 11/19/12 0500  Gross per 24 hour  Intake    720 ml  Output   2400 ml  Net  -1680 ml    Neck: incision healing well,no hematoma, mod ecchymosis resolving Neuro Exam: tongue midline, upper and lower extremities 5/5 motor, no facial assymmetry, speech fluent   ASSESSMENT: 5 Days Post-Op Right CEA  PLAN: Per TCTS Pt states she may go home today F/U 2 weeks with Dr. Arbie Cookey - pt already has appt. Discussed s/s of stroke Pt may shower from our standpoint

## 2012-11-19 NOTE — Progress Notes (Signed)
Pt up ambulating in hallway at this time; no walker needed; steady gait; no complaints of pain; will cont. To monitor.

## 2012-11-23 ENCOUNTER — Other Ambulatory Visit: Payer: Self-pay

## 2012-11-23 MED ORDER — POTASSIUM CHLORIDE ER 10 MEQ PO TBCR: 20.0000 meq | EXTENDED_RELEASE_TABLET | Freq: Two times a day (BID) | ORAL | Status: DC

## 2012-11-29 ENCOUNTER — Other Ambulatory Visit: Payer: Medicare Other

## 2012-11-29 ENCOUNTER — Encounter: Payer: Medicare Other | Admitting: Vascular Surgery

## 2012-12-05 ENCOUNTER — Other Ambulatory Visit: Payer: Self-pay | Admitting: *Deleted

## 2012-12-05 ENCOUNTER — Encounter: Payer: Self-pay | Admitting: Vascular Surgery

## 2012-12-05 ENCOUNTER — Encounter: Payer: Self-pay | Admitting: Physician Assistant

## 2012-12-05 ENCOUNTER — Ambulatory Visit (INDEPENDENT_AMBULATORY_CARE_PROVIDER_SITE_OTHER): Payer: Medicare Other | Admitting: Physician Assistant

## 2012-12-05 VITALS — BP 140/52 | HR 56 | Ht 62.0 in | Wt 98.0 lb

## 2012-12-05 DIAGNOSIS — R911 Solitary pulmonary nodule: Secondary | ICD-10-CM

## 2012-12-05 DIAGNOSIS — I5022 Chronic systolic (congestive) heart failure: Secondary | ICD-10-CM

## 2012-12-05 DIAGNOSIS — I779 Disorder of arteries and arterioles, unspecified: Secondary | ICD-10-CM

## 2012-12-05 DIAGNOSIS — I2510 Atherosclerotic heart disease of native coronary artery without angina pectoris: Secondary | ICD-10-CM

## 2012-12-05 DIAGNOSIS — I2589 Other forms of chronic ischemic heart disease: Secondary | ICD-10-CM

## 2012-12-05 DIAGNOSIS — E785 Hyperlipidemia, unspecified: Secondary | ICD-10-CM

## 2012-12-05 MED ORDER — LISINOPRIL 5 MG PO TABS: 5.0000 mg | ORAL_TABLET | Freq: Every day | ORAL | Status: DC

## 2012-12-05 NOTE — Patient Instructions (Signed)
INCREASE LISINOPRIL TO 5 MG DAILY; NEW RX WAS SENT IN TODAY TO [facility] ON [address]  YOU WILL NEED LAB (BMET) IN 1 WEEK AFTER INCREASING THE LISINOPRIL  PLEASE SCHEDULE TO HAVE ECHO DONE AFTER 02/13/13 DX 414.8, 428.22  PLEASE FOLLOW UP WITH SCOTT WEAVER, PAC IN 3 WEEKS  PLEASE FOLLOW UP WITH DR. CRENSHAW IN 6 WEEKS

## 2012-12-05 NOTE — Progress Notes (Signed)
[address], [address] [place], [place]  [address] Phone: [phone] Fax:  [phone]  Date:  12/05/2012   ID:  Tonya Valdez, DOB 03/27/1940, MRN [id number]  PCP:  Oliver Barre, MD  Cardiologist:  Dr. Olga Millers     History of Present Illness: Tonya Valdez is a 72 y.o. female who returns for follow up after a recent admission to the hospital for CABG+CEA.  She has a hx of bladder CA s/p resection 05/2012, COPD, T2DM.  She was recently dx with new onset systolic CHF with an EF of 30%. Echo (08/17/12): EF 30%, diffuse HK, mild MR, mild BAE, PASP.  She was evaluated by Dr. Jens Som.  She was noted to have bilateral carotid bruits.  Carotid US (8/14): RICA 80-99%; LICA 60-79%.  Of note, abdominal US (8/14): No AAA, severe bilateral CIA stenosis noted.  She was referred to vascular surgery for possible CEA and evaluation of PAD. She was also set up for cardiac catheterization.   LHC (10/07/12):  Ostial LM 60%, distal LM 40-50%, oLAD 40-50%, mLAD 70-80%, pCFX subtotally occluded, mCFX 80-90%, oRCA occluded, ostial L subclavian 40%, EF 25-30%, abdominal aorta diffusely diseased with severe ulcerated plaquing (right iliac ostial 95%; left renal long 60-70%, right okay). CO 2.1, CI 1.5.  She was referred to TCTS.  CABG plus right CEA was arranged with [facility].  Of note, chest CT (8/14): Posterior RLL 8mm nodule, posterior LLL 7 mm nodule, other small nodules in upper lung zones; f/u rec in 4-6 mos.  Pre-CABG ABIs: normal.    She underwent CABG 11/14/12 (LIMA-LAD, SVG-OM, SVG-PDA) along with right CEA. Postoperative course was fairly uneventful. She remained in NSR.  She is doing well.  The patient denies chest pain, shortness of breath, syncope, orthopnea, PND or significant pedal edema.   Labs (8/14):  TSH 3.26 Labs (9/14):  K 4.2, Cr 0.79, ALT 17, HDL 55.2, LDL 51, Hgb 8.1    Wt Readings from Last 3 Encounters:  12/05/12 98 lb (44.453 kg)  11/19/12 108 lb 8 oz (49.215 kg)  11/19/12 108 lb 8  oz (49.215 kg)     Past Medical History  Diagnosis Date  . Positive TB test   . Bladder tumor   . COPD (chronic obstructive pulmonary disease)   . Diabetes mellitus TYPE II--  PER PCP NOTE DR Oliver Barre    PER PT DOES NOT TAKES METFORMIN PRESCRIBED, SHE WATCHES DIET AND EXERCISES  . Cancer     bladder  . H/O hiatal hernia     s/p  72 yrs old  . Chronic systolic CHF (congestive heart failure) 08/04/2012    Echo (08/17/12): EF 30%, diffuse HK, mild MR, mild BAE, PASP.  . Ischemic cardiomyopathy   . CAD (coronary artery disease)     a. LHC (10/07/12):  Ostial LM 60%, distal LM 40-50%, oLAD 40-50%, mLAD 70-80%, pCFX subtotally occluded, mCFX 80-90%, oRCA occluded, ostial L subclavian 40%, EF 25-30%, abdominal aorta diffusely diseased with severe ulcerated plaquing (right iliac ostial 95%; left renal long 60-70%, right okay). CO 2.1, CI 1.5 => s/p CABG (L-LAD, S-OM, S-PDA) 10/2012 with Dr. Laneta Simmers   . Bilateral carotid artery disease 09/26/2012    carotid US (8/14):  R 80-99; L 60-79 => s/p R CEA 10/2012 (Dr. Arbie Cookey)  . Lung nodule     chest CT 09/2012 => needs repeat by 03/2013  . PAD (peripheral artery disease)   . Hyperlipidemia     Current Outpatient  Prescriptions  Medication Sig Dispense Refill  . ACCU-CHEK FASTCLIX LANCETS MISC USE TO CHECK BLOOD SUGAR FOUR TIMES DAILY  102 each  11  . ACCU-CHEK SMARTVIEW test strip USE TO CHECK BLOOD SUGAR FOUR TIMES DAILY AS DIRECTED  100 each  11  . aspirin EC 325 MG EC tablet Take 1 tablet (325 mg total) by mouth daily.  30 tablet  0  . carvedilol (COREG) 3.125 MG tablet Take 1 tablet (3.125 mg total) by mouth 2 (two) times daily with a meal.  60 tablet  3  . furosemide (LASIX) 20 MG tablet Take 2 tablets (40 mg total) by mouth daily.  30 tablet  3  . lisinopril (PRINIVIL,ZESTRIL) 2.5 MG tablet Take 1 tablet (2.5 mg total) by mouth daily.  90 tablet  3  . metFORMIN (GLUCOPHAGE) 500 MG tablet Take 1 tablet by mouth 2 (two) times daily with a meal.         . Multiple Vitamin (MULTIVITAMIN) tablet Take 1 tablet by mouth daily.      . Omega-3 Fatty Acids (FISH OIL) 1000 MG CAPS Take 1 each by mouth daily.      . potassium chloride (K-DUR) 10 MEQ tablet Take 2 tablets (20 mEq total) by mouth 2 (two) times daily.  120 tablet  3  . pravastatin (PRAVACHOL) 40 MG tablet Take 1 tablet (40 mg total) by mouth every evening.  30 tablet  11  . traMADol (ULTRAM) 50 MG tablet Take 1-2 tablets (50-100 mg total) by mouth every 4 (four) hours as needed.  30 tablet  0   No current facility-administered medications for this visit.    Allergies:   No Known Allergies  Social History:  The patient  reports that she has been smoking Cigarettes.  She has a 10 pack-year smoking history. She has never used smokeless tobacco. She reports that she does not drink alcohol or use illicit drugs.   Family History:  The patient's family history includes Cancer in her father, mother, and other; Cervical cancer in her mother; Colon cancer in her father; Diabetes in her mother and other; Heart disease in her mother; Hypertension in her father, mother, and other; Kidney disease in her other; Lung cancer (age of onset: 97) in her father.   ROS:  Please see the history of present illness.      All other systems reviewed and negative.   PHYSICAL EXAM: VS:  BP 140/52  Pulse 56  Ht 5\' 2"  (1.575 m)  Wt 98 lb (44.453 kg)  BMI 17.92 kg/m2 Well nourished, well developed, in no acute distress HEENT: normal Neck: no JVD; R CEA scar well healed without erythema or d/c. Chest:  Median sternotomy well healed without erythema or d/c. Cardiac:  normal S1, S2; RRR; no murmur Lungs:  Decreased breath sounds bilaterally, no wheezing, rhonchi or rales Abd: soft, nontender, no hepatomegaly Ext: no edema Skin: warm and dry Neuro:  CNs 2-12 intact, no focal abnormalities noted  EKG:  Sinus bradycardia, HR 56, rightward axis, nonspecific ST-T wave changes     ASSESSMENT AND PLAN:  1. CAD:   Doing well after recent CABG. She is a retired Engineer, civil (consulting). She is not interested in cardiac rehabilitation. She has been excising daily without significant symptoms. Continue aspirin and statin. 2. Ischemic CM:  Continue current dose of beta blocker. Increase lisinopril to 5 mg daily. Check a basic metabolic panel in one week. Plan followup echo 3 months post bypass surgery. Consider adding spironolactone. 3.  Chronic Systolic CHF:  Volume stable. Continue current dose of Lasix and potassium 4. Carotid Stenosis:  S/p R CEA.  Follow up with vascular surgery. 5. PAD:  Follow up with vascular surgery. She denies symptoms of claudication. 6. Lung Nodules:  She tells me that she has a follow up CT planned through her primary care physician in January. 7. COPD:  She has quit smoking. 8. Hyperlipidemia:  Continue statin. 9. Bladder CA:  Follow up with urology as planned. 10. Disposition:  Follow up with me in 3 weeks for further medication titration. Follow up with Dr. Jens Som in 6 weeks. Plan follow up echocardiogram in December.  Signed, Tereso Newcomer, PA-C  12/05/2012 11:38 AM

## 2012-12-06 ENCOUNTER — Encounter: Payer: Self-pay | Admitting: Vascular Surgery

## 2012-12-06 ENCOUNTER — Ambulatory Visit (INDEPENDENT_AMBULATORY_CARE_PROVIDER_SITE_OTHER): Payer: Medicare Other | Admitting: Vascular Surgery

## 2012-12-06 DIAGNOSIS — I6529 Occlusion and stenosis of unspecified carotid artery: Secondary | ICD-10-CM

## 2012-12-06 NOTE — Addendum Note (Signed)
Addended by: Sharee Pimple on: 12/06/2012 01:17 PM   Modules accepted: Orders

## 2012-12-06 NOTE — Progress Notes (Signed)
Patient has today for followup of her combined right parotid endarterectomy and coronary bypass grafting by Dr. Laneta Simmers. She did extremely well the hospital was discharged home without complication. She's had no neurologic deficits. Her right neck incision is healing quite nicely. She does have a harsh bilateral carotid bruits. Her neck incision and her sternal incision healed very well. She is grossly intact neurologically.  On reviewing her preoperative duplex she did have a 60-79% stenosis in the left carotid artery.  Impression and plan: Stable status post right carotid endarterectomy for high-grade asymptomatic stenosis. She will continue her usual activities that limitation. We will see her in 6 months with repeat carotid duplex evaluation. She knows to notify should she have any wound problems or neurologic deficits.

## 2012-12-07 ENCOUNTER — Encounter: Payer: Self-pay | Admitting: Surgery

## 2012-12-07 ENCOUNTER — Ambulatory Visit
Admission: RE | Admit: 2012-12-07 | Discharge: 2012-12-07 | Disposition: A | Discharge status: Home / Self Care | Payer: Medicare Other | Source: Ambulatory Visit | Attending: Surgery | Admitting: Surgery

## 2012-12-07 ENCOUNTER — Ambulatory Visit (INDEPENDENT_AMBULATORY_CARE_PROVIDER_SITE_OTHER): Payer: Self-pay | Admitting: Surgery

## 2012-12-07 VITALS — BP 179/62 | HR 58 | Resp 16 | Ht 62.0 in | Wt 96.0 lb

## 2012-12-07 DIAGNOSIS — Z951 Presence of aortocoronary bypass graft: Secondary | ICD-10-CM

## 2012-12-07 DIAGNOSIS — I2510 Atherosclerotic heart disease of native coronary artery without angina pectoris: Secondary | ICD-10-CM

## 2012-12-07 NOTE — Progress Notes (Signed)
[address]       [name] [address]             [phone]        HPI:  Patient returns for routine postoperative follow-up having undergone coronary bypass graft surgery x3 on 11/14/2012. The patient's early postoperative recovery while in the hospital was notable for an uncomplicated postoperative course. Since hospital discharge the patient reports she has remained very active and is walking daily without chest pain or shortness of breath. She has continued to abstain from smoking.   Current Outpatient Prescriptions  Medication Sig Dispense Refill  . ACCU-CHEK FASTCLIX LANCETS MISC USE TO CHECK BLOOD SUGAR FOUR TIMES DAILY  102 each  11  . ACCU-CHEK SMARTVIEW test strip USE TO CHECK BLOOD SUGAR FOUR TIMES DAILY AS DIRECTED  100 each  11  . aspirin EC 325 MG EC tablet Take 1 tablet (325 mg total) by mouth daily.  30 tablet  0  . carvedilol (COREG) 3.125 MG tablet Take 1 tablet (3.125 mg total) by mouth 2 (two) times daily with a meal.  60 tablet  3  . furosemide (LASIX) 20 MG tablet Take 2 tablets (40 mg total) by mouth daily.  30 tablet  3  . lisinopril (PRINIVIL,ZESTRIL) 5 MG tablet Take 1 tablet (5 mg total) by mouth daily.  90 tablet  3  . metFORMIN (GLUCOPHAGE) 500 MG tablet Take 1 tablet by mouth 2 (two) times daily with a meal.       . Multiple Vitamin (MULTIVITAMIN) tablet Take 1 tablet by mouth daily.      . Omega-3 Fatty Acids (FISH OIL) 1000 MG CAPS Take 1 each by mouth daily.      . potassium chloride (K-DUR) 10 MEQ tablet Take 2 tablets (20 mEq total) by mouth 2 (two) times daily.  120 tablet  3  . pravastatin (PRAVACHOL) 40 MG tablet Take 1 tablet (40 mg total) by mouth every evening.  30 tablet  11   No current facility-administered medications for this visit.    Physical Exam: BP 179/62  Pulse 58  Resp 16  Ht 5\' 2"  (1.575 m)  Wt 96 lb (43.545 kg)  BMI 17.55 kg/m2  SpO2 97% She looks well. Cardiac exam shows a regular rate and  rhythm with normal heart sounds. Lung exam is clear. Chest incision is healing well and sternum is stable. The right leg vein harvest incision is intact. There is a small hematoma or seroma just above this incision. The left leg vein harvest incision is well-healed. There is no peripheral edema.  Diagnostic Tests:  CLINICAL DATA:  Followup CABG.   EXAM: CHEST  2 VIEW   COMPARISON:  11/16/2012 and CT chest 10/06/2012.   FINDINGS: Trachea is midline. Heart size normal. Biapical pleural parenchymal scarring. Lungs are emphysematous. Improving bibasilar aeration. No pleural fluid. Degenerative changes are seen in the spine. Mild compression of T11 is unchanged.   IMPRESSION: Emphysema with complete or near-complete resolution of bibasilar airspace disease.     Electronically Signed   By: Leanna Battles M.D.   On: 12/07/2012 13:53   Impression:  Overall she is making great progress following coronary bypass graft surgery and right carotid endarterectomy by Dr. Arbie Cookey. I told her she can return to driving a car but should not lift anything heavier than 10 pounds for 3 months postoperatively. I encouraged her to continue to  abstain from smoking. She had a CT  scan of the chest done on 10/06/2012 that showed a stable 8 mm indeterminate pulmonary nodule in the posterior right lower lobe and a new 7 mm pulmonary nodule in the posterior left lower lobe with other smaller indeterminate nodules in the upper lung zones. She should have a followup CT scan the chest done 6 months after that scan in August and she said that Dr. Jonny Ruiz was planning on doing that in January. I will be happy to see her back after that if there is any question about those pulmonary nodules.  Plan:  She will continue followup with Dr. Jonny Ruiz and Dr. Jens Som and will contact me if she develops any problems with her incisions.

## 2012-12-08 ENCOUNTER — Ambulatory Visit (HOSPITAL_COMMUNITY): Payer: Medicare Other

## 2012-12-13 ENCOUNTER — Telehealth: Payer: Self-pay | Admitting: *Deleted

## 2012-12-13 ENCOUNTER — Other Ambulatory Visit (INDEPENDENT_AMBULATORY_CARE_PROVIDER_SITE_OTHER): Payer: Medicare Other

## 2012-12-13 DIAGNOSIS — I255 Ischemic cardiomyopathy: Secondary | ICD-10-CM

## 2012-12-13 DIAGNOSIS — I5022 Chronic systolic (congestive) heart failure: Secondary | ICD-10-CM

## 2012-12-13 DIAGNOSIS — I2589 Other forms of chronic ischemic heart disease: Secondary | ICD-10-CM

## 2012-12-13 DIAGNOSIS — I25810 Atherosclerosis of coronary artery bypass graft(s) without angina pectoris: Secondary | ICD-10-CM

## 2012-12-13 LAB — BASIC METABOLIC PANEL
BUN: 30 mg/dL — ABNORMAL HIGH (ref 6–23)
BUN: 29 mg/dL — ABNORMAL HIGH (ref 6–23)
CO2: 25 mEq/L (ref 19–32)
CO2: 29 mEq/L (ref 19–32)
Calcium: 10.5 mg/dL (ref 8.4–10.5)
Calcium: 9.7 mg/dL (ref 8.4–10.5)
Chloride: 99 mEq/L (ref 96–112)
Creatinine, Ser: 1 mg/dL (ref 0.4–1.2)
Creatinine, Ser: 1.1 mg/dL (ref 0.4–1.2)
GFR: 53.39 mL/min — ABNORMAL LOW (ref >60.00)
Glucose, Bld: 172 mg/dL — ABNORMAL HIGH (ref 70–99)
Sodium: 142 mEq/L (ref 135–145)
Sodium: 137 mEq/L (ref 135–145)

## 2012-12-13 NOTE — Telephone Encounter (Signed)
Lab called with results for today's BMET K+ level (6.6). Tereso Newcomer, [place], advises that this needs to be redrawn STAT (he wants to rule out that the sample was hemolyzed).  Notified patient. Advised her that Tereso Newcomer, [place], would like for her to go to the [facility] to get her lab redrawn immediately. Instructed her that Tereso Newcomer, [place], advised the following:  Get Stat lab redrawn today, Hold Lisinopril and Potassium medications until Tereso Newcomer reviews the new results. Plan is that if the level is still critically elevated, she will need to go to the Emergency Room.  Patient was agreeable to the plan to come back to get lab redrawn, although she is "not happy" about having to drive back to [place]. Patient stated to call her on her cell phone with the results as soon as we get them.

## 2012-12-13 NOTE — Telephone Encounter (Signed)
Repeat BMET in progress with K+ results in, as final - 5.2. Advised Tonya Valdez, [place].  He is making the following changes: Decrease Lasix to 20mg  by mouth daily, Hold Lisinopril, Hold K-Dur, repeat BMET on Thursday, 12/15/12.  Called patient. Patient agrees with treatment plan. Will get BMET repeated on Thursday and adjust meds, as above noted. States she prefers to have blood drawn at the [place] lab.

## 2012-12-15 ENCOUNTER — Other Ambulatory Visit (INDEPENDENT_AMBULATORY_CARE_PROVIDER_SITE_OTHER): Payer: Medicare Other

## 2012-12-15 DIAGNOSIS — I255 Ischemic cardiomyopathy: Secondary | ICD-10-CM

## 2012-12-15 DIAGNOSIS — I2589 Other forms of chronic ischemic heart disease: Secondary | ICD-10-CM

## 2012-12-15 DIAGNOSIS — I25810 Atherosclerosis of coronary artery bypass graft(s) without angina pectoris: Secondary | ICD-10-CM

## 2012-12-15 LAB — BASIC METABOLIC PANEL
BUN: 28 mg/dL — ABNORMAL HIGH (ref 6–23)
Calcium: 10.1 mg/dL (ref 8.4–10.5)
GFR: 62 mL/min (ref >60.00)
Glucose, Bld: 148 mg/dL — ABNORMAL HIGH (ref 70–99)
Potassium: 5.4 mEq/L — ABNORMAL HIGH (ref 3.5–5.1)
Sodium: 136 mEq/L (ref 135–145)

## 2012-12-15 NOTE — Telephone Encounter (Signed)
pt notified about lab results and repeat bmet 12/20/12 at the [facility] office. Pt aware to hold lasix and decrease dietary K+. Pt also states she is NOT taking K+ or lisinopril.

## 2012-12-21 ENCOUNTER — Other Ambulatory Visit (INDEPENDENT_AMBULATORY_CARE_PROVIDER_SITE_OTHER): Payer: Medicare Other

## 2012-12-21 DIAGNOSIS — I25810 Atherosclerosis of coronary artery bypass graft(s) without angina pectoris: Secondary | ICD-10-CM

## 2012-12-21 DIAGNOSIS — I2589 Other forms of chronic ischemic heart disease: Secondary | ICD-10-CM

## 2012-12-21 DIAGNOSIS — I255 Ischemic cardiomyopathy: Secondary | ICD-10-CM

## 2012-12-21 LAB — BASIC METABOLIC PANEL
BUN: 12 mg/dL (ref 6–23)
CO2: 27 mEq/L (ref 19–32)
Calcium: 9.4 mg/dL (ref 8.4–10.5)
Chloride: 104 mEq/L (ref 96–112)
Creatinine, Ser: 0.6 mg/dL (ref 0.4–1.2)
Sodium: 138 mEq/L (ref 135–145)

## 2012-12-29 ENCOUNTER — Telehealth: Payer: Self-pay

## 2012-12-29 ENCOUNTER — Other Ambulatory Visit: Payer: Self-pay

## 2012-12-29 MED ORDER — METFORMIN HCL 500 MG PO TABS: 500.0000 mg | ORAL_TABLET | Freq: Two times a day (BID) | ORAL | Status: DC

## 2012-12-29 NOTE — Telephone Encounter (Signed)
Mail order refill done 

## 2013-01-02 ENCOUNTER — Encounter: Payer: Self-pay | Admitting: *Deleted

## 2013-01-02 ENCOUNTER — Encounter: Payer: Self-pay | Admitting: Physician Assistant

## 2013-01-02 ENCOUNTER — Ambulatory Visit (INDEPENDENT_AMBULATORY_CARE_PROVIDER_SITE_OTHER): Payer: Medicare Other | Admitting: Physician Assistant

## 2013-01-02 VITALS — BP 166/68 | HR 72 | Ht 62.0 in | Wt 102.0 lb

## 2013-01-02 DIAGNOSIS — E785 Hyperlipidemia, unspecified: Secondary | ICD-10-CM

## 2013-01-02 DIAGNOSIS — R911 Solitary pulmonary nodule: Secondary | ICD-10-CM

## 2013-01-02 DIAGNOSIS — I2510 Atherosclerotic heart disease of native coronary artery without angina pectoris: Secondary | ICD-10-CM

## 2013-01-02 DIAGNOSIS — I2589 Other forms of chronic ischemic heart disease: Secondary | ICD-10-CM

## 2013-01-02 DIAGNOSIS — I779 Disorder of arteries and arterioles, unspecified: Secondary | ICD-10-CM

## 2013-01-02 DIAGNOSIS — I5022 Chronic systolic (congestive) heart failure: Secondary | ICD-10-CM

## 2013-01-02 DIAGNOSIS — I10 Essential (primary) hypertension: Secondary | ICD-10-CM

## 2013-01-02 DIAGNOSIS — E875 Hyperkalemia: Secondary | ICD-10-CM

## 2013-01-02 LAB — LIPID PANEL
Cholesterol: 120 mg/dL (ref 0–200)
LDL Cholesterol: 49 mg/dL (ref 0–99)
VLDL: 21.6 mg/dL (ref 0.0–40.0)

## 2013-01-02 LAB — BASIC METABOLIC PANEL
BUN: 18 mg/dL (ref 6–23)
Calcium: 9.5 mg/dL (ref 8.4–10.5)
Chloride: 102 mEq/L (ref 96–112)
GFR: 85.7 mL/min (ref >60.00)
Potassium: 4.5 mEq/L (ref 3.5–5.1)
Sodium: 136 mEq/L (ref 135–145)

## 2013-01-02 LAB — HEPATIC FUNCTION PANEL
AST: 25 U/L (ref 0–37)
Alkaline Phosphatase: 81 U/L (ref 39–117)
Total Bilirubin: 0.6 mg/dL (ref 0.3–1.2)

## 2013-01-02 MED ORDER — HYDRALAZINE HCL 25 MG PO TABS: 25.0000 mg | ORAL_TABLET | Freq: Three times a day (TID) | ORAL | Status: DC

## 2013-01-02 MED ORDER — FUROSEMIDE 20 MG PO TABS: 20.0000 mg | ORAL_TABLET | Freq: Every day | ORAL | Status: DC

## 2013-01-02 NOTE — Progress Notes (Signed)
[address] [place], [place]  [address] Phone: [phone] Fax:  [phone]  Date:  01/02/2013   ID:  Tonya Valdez, DOB 08-14-1940, MRN [id number]  PCP:  Oliver Barre, MD  Cardiologist:  Dr. Olga Millers     History of Present Illness: Tonya Valdez is a 72 y.o. female with a hx of bladder CA s/p resection 05/2012, COPD, T2DM.  She was recently evaluated for new onset systolic CHF with an EF of 30%. Echo (08/17/12): EF 30%, diffuse HK, mild MR, mild BAE, PASP.  Carotid US (8/14): RICA 80-99%; LICA 60-79%.  Abdominal US (8/14): No AAA, severe bilateral CIA stenosis noted.  She was referred to vascular surgery for possible CEA and evaluation of PAD.  LHC (10/07/12):  oLM 60%, dLM 40-50%, oLAD 40-50%, mLAD 70-80%, pCFX subtotally occluded, mCFX 80-90%, oRCA occluded, ostial L subclavian 40%, EF 25-30%, abdominal aorta diffusely diseased with severe ulcerated plaquing (right iliac ostial 95%; left renal long 60-70%, right okay). CO 2.1, CI 1.5.  Chest CT (8/14): Posterior RLL 8mm nodule, posterior LLL 7 mm nodule, other small nodules in upper lung zones; f/u rec in 4-6 mos.  Pre-CABG ABIs: normal.    She ultimately underwent CABG 11/14/12 (LIMA-LAD, SVG-OM, SVG-PDA) along with right CEA. Postoperative course was fairly uneventful. She remained in NSR.  I saw her in follow up 12/05/12.  I adjusted her ACEI.  F/u labs demonstrated hyperkalemia.  Lasix and ACEI were both held and K+ has remained stable since.  Doing well since last seen.  The patient denies chest pain, shortness of breath, syncope, orthopnea, PND or significant pedal edema.   Recent Labs: Labs (8/14):  TSH 3.26 Labs (9/14):  K 4.2, Cr 0.79, ALT 17, HDL 55.2, LDL 51, Hgb 8.1   Potassium  Date/Time Value Range Status  12/21/2012 10:12 AM 5.2* 3.5 - 5.1 mEq/L Final  12/15/2012 10:49 AM 5.4* 3.5 - 5.1 mEq/L Final  12/13/2012  4:29 PM 5.2* 3.5 - 5.1 mEq/L Final  12/13/2012 11:14 AM 6.6* 3.5 - 5.1 mEq/L Final       Creatinine, Ser  Date/Time Value Range Status  12/21/2012 10:12 AM 0.6  0.4 - 1.2 mg/dL Final  95/28/4132 44:01 AM 0.9  0.4 - 1.2 mg/dL Final  02/72/5366  4:40 PM 1.1  0.4 - 1.2 mg/dL Final  34/74/2595 63:87 AM 1.0  0.4 - 1.2 mg/dL Final    Wt Readings from Last 3 Encounters:  01/02/13 102 lb (46.267 kg)  12/07/12 96 lb (43.545 kg)  12/06/12 99 lb 4.8 oz (45.042 kg)     Past Medical History  Diagnosis Date  . Positive TB test   . Bladder tumor   . COPD (chronic obstructive pulmonary disease)   . Diabetes mellitus TYPE II--  PER PCP NOTE DR Oliver Barre    PER PT DOES NOT TAKES METFORMIN PRESCRIBED, SHE WATCHES DIET AND EXERCISES  . Cancer     bladder  . H/O hiatal hernia     s/p  73 yrs old  . Chronic systolic CHF (congestive heart failure) 08/04/2012    Echo (08/17/12): EF 30%, diffuse HK, mild MR, mild BAE, PASP.  . Ischemic cardiomyopathy   . CAD (coronary artery disease)     a. LHC (10/07/12):  Ostial LM 60%, distal LM 40-50%, oLAD 40-50%, mLAD 70-80%, pCFX subtotally occluded, mCFX 80-90%, oRCA occluded, ostial L subclavian 40%, EF 25-30%, abdominal aorta diffusely diseased with severe ulcerated plaquing (right iliac ostial 95%; left  renal long 60-70%, right okay). CO 2.1, CI 1.5 => s/p CABG (L-LAD, S-OM, S-PDA) 10/2012 with Dr. Laneta Simmers   . Bilateral carotid artery disease 09/26/2012    carotid US (8/14):  R 80-99; L 60-79 => s/p R CEA 10/2012 (Dr. Arbie Cookey)  . Lung nodule     chest CT 09/2012 => needs repeat by 03/2013  . PAD (peripheral artery disease)   . Hyperlipidemia   . Hyperkalemia     ACEI d/c'd 11/2012    Current Outpatient Prescriptions  Medication Sig Dispense Refill  . ACCU-CHEK FASTCLIX LANCETS MISC USE TO CHECK BLOOD SUGAR FOUR TIMES DAILY  102 each  11  . ACCU-CHEK SMARTVIEW test strip USE TO CHECK BLOOD SUGAR FOUR TIMES DAILY AS DIRECTED  100 each  11  . aspirin EC 325 MG EC tablet Take 1 tablet (325 mg total) by mouth daily.  30 tablet  0  . carvedilol (COREG)  3.125 MG tablet Take 1 tablet (3.125 mg total) by mouth 2 (two) times daily with a meal.  60 tablet  3  . metFORMIN (GLUCOPHAGE) 500 MG tablet Take 1 tablet (500 mg total) by mouth 2 (two) times daily with a meal.  180 tablet  3  . Multiple Vitamin (MULTIVITAMIN) tablet Take 1 tablet by mouth daily.      . Omega-3 Fatty Acids (FISH OIL) 1000 MG CAPS Take 1 each by mouth daily.      . pravastatin (PRAVACHOL) 40 MG tablet Take 1 tablet (40 mg total) by mouth every evening.  30 tablet  11   No current facility-administered medications for this visit.    Allergies:   No Known Allergies  Social History:  The patient  reports that she quit smoking about 7 weeks ago. Her smoking use included Cigarettes. She has a 10 pack-year smoking history. She has never used smokeless tobacco. She reports that she does not drink alcohol or use illicit drugs.   Family History:  The patient's family history includes Cancer in her father, mother, and other; Cervical cancer in her mother; Colon cancer in her father; Diabetes in her mother and other; Heart disease in her mother; Hypertension in her father, mother, and other; Kidney disease in her other; Lung cancer (age of onset: 96) in her father.   ROS:  Please see the history of present illness.    All other systems reviewed and negative.   PHYSICAL EXAM: VS:  BP 166/68  Pulse 72  Ht 5\' 2"  (1.575 m)  Wt 102 lb (46.267 kg)  BMI 18.65 kg/m2  SpO2 99% Well nourished, well developed, in no acute distress HEENT: normal Neck: no JVD Cardiac:  normal S1, S2; RRR; no murmur Lungs:  Decreased breath sounds bilaterally, no wheezing, rhonchi or rales Abd: soft, nontender, no hepatomegaly Ext: trace to 1+ bilateral ankle edema Skin: warm and dry Neuro:  CNs 2-12 intact, no focal abnormalities noted  EKG:   NSR, HR 72, rightward axis, nonspecific ST-T wave changes     ASSESSMENT AND PLAN:  1. CAD:  No angina.  Continue aspirin and statin. 2. Ischemic CM:  Will  avoid ACEI given tendency for hyperkalemia.  Will add Hydralazine 25 mg TID.  Keep f/u with Dr. Jens Som.  Plan followup echo 3 months post bypass surgery.  3. Hyperkalemia:  Repeat BMET today.  No high grade RAS at time of cardiac cath.  4. Hypertension: BP uncontrolled.  Add Hydralazine as noted. 5. Chronic Systolic CHF:  She does have some increased  LE edema.  Will add Lasix 20 mg QD back to her regimen.  Check repeat BMET in 1 week.  6. Carotid Stenosis:  S/p R CEA.  Follow up with vascular surgery as planned. 7. PAD:  Follow up with vascular surgery as planned.   8. Lung Nodules:  She will have a follow up CT as planned through her primary care physician in January. 9. COPD:  She is still not smoking.  10. Hyperlipidemia:  Check Lipids and LFTs today.  Continue statin. 11. Bladder CA:  Follow up with urology as planned. 12. Disposition:  Follow up with Dr. Jens Som 01/2013.  Will give her a note to return to work.  Plan follow up echocardiogram in December.  Signed, Tereso Newcomer, PA-C  01/02/2013 10:01 AM

## 2013-01-02 NOTE — Patient Instructions (Signed)
START HYDRALAZINE 25 MG 1 TABLET 3 TIMES A DAY ; RX SENT IN TODAY RE-START LASIX 20 MG DAILY  LABS TODAY; BMET, LFT, LIPID  REPEAT BMET ON 01/10/13  YOU HAVE BEEN GIVEN A WORK NOTE   MAKE SURE TO KEEP YOUR APPT WITH DR. CRENSHAW ON 01/23/13

## 2013-01-10 ENCOUNTER — Other Ambulatory Visit (INDEPENDENT_AMBULATORY_CARE_PROVIDER_SITE_OTHER): Payer: Medicare Other

## 2013-01-10 DIAGNOSIS — E875 Hyperkalemia: Secondary | ICD-10-CM

## 2013-01-10 DIAGNOSIS — I5022 Chronic systolic (congestive) heart failure: Secondary | ICD-10-CM

## 2013-01-10 LAB — BASIC METABOLIC PANEL
BUN: 16 mg/dL (ref 6–23)
GFR: 91.62 mL/min (ref >60.00)
Potassium: 3.6 mEq/L (ref 3.5–5.1)
Sodium: 134 mEq/L — ABNORMAL LOW (ref 135–145)

## 2013-01-23 ENCOUNTER — Ambulatory Visit (INDEPENDENT_AMBULATORY_CARE_PROVIDER_SITE_OTHER): Payer: Medicare Other | Admitting: Cardiology

## 2013-01-23 ENCOUNTER — Encounter: Payer: Self-pay | Admitting: Cardiology

## 2013-01-23 VITALS — BP 122/56 | HR 76 | Ht 62.0 in | Wt 102.0 lb

## 2013-01-23 DIAGNOSIS — I779 Disorder of arteries and arterioles, unspecified: Secondary | ICD-10-CM

## 2013-01-23 DIAGNOSIS — I509 Heart failure, unspecified: Secondary | ICD-10-CM

## 2013-01-23 DIAGNOSIS — I2510 Atherosclerotic heart disease of native coronary artery without angina pectoris: Secondary | ICD-10-CM

## 2013-01-23 DIAGNOSIS — I255 Ischemic cardiomyopathy: Secondary | ICD-10-CM

## 2013-01-23 DIAGNOSIS — I2589 Other forms of chronic ischemic heart disease: Secondary | ICD-10-CM

## 2013-01-23 MED ORDER — ISOSORBIDE MONONITRATE ER 30 MG PO TB24: 30.0000 mg | ORAL_TABLET | Freq: Every day | ORAL | Status: DC

## 2013-01-23 NOTE — Assessment & Plan Note (Signed)
Continue present dose of Lasix. 

## 2013-01-23 NOTE — Assessment & Plan Note (Signed)
Continue aspirin and statin. Followed by vascular surgery. 

## 2013-01-23 NOTE — Assessment & Plan Note (Signed)
Continue aspirin and statin. 

## 2013-01-23 NOTE — Assessment & Plan Note (Signed)
Continue beta blocker. She did not tolerate ACE inhibitors because of hyperkalemia. Continue hydralazine. Add Imdur 30 mg by mouth daily. Repeat echocardiogram in 3 months. If ejection fraction less than 35% we will need to consider ICD.

## 2013-01-23 NOTE — Progress Notes (Signed)
HPI: FU CAD. Echo (08/17/12): EF 30%, diffuse HK, mild MR, mild BAE, PASP. Carotid US (8/14): RICA 80-99%; LICA 60-79%. Abdominal US (8/14): No AAA, severe bilateral CIA stenosis noted. She was referred to vascular surgery for possible CEA and evaluation of PAD. LHC (10/07/12): oLM 60%, dLM 40-50%, oLAD 40-50%, mLAD 70-80%, pCFX subtotally occluded, mCFX 80-90%, oRCA occluded, ostial L subclavian 40%, EF 25-30%, abdominal aorta diffusely diseased with severe ulcerated plaquing (right iliac ostial 95%; left renal long 60-70%, right okay). CO 2.1, CI 1.5. Chest CT (8/14): Posterior RLL 8mm nodule, posterior LLL 7 mm nodule, other small nodules in upper lung zones; f/u rec in 4-6 mos. Pre-CABG ABIs: normal. She underwent CABG 11/14/12 (LIMA-LAD, SVG-OM, SVG-PDA) along with right CEA. Developed hyperkalemia with ACEI. Patient subsequently placed on hydralazine. Since she was last seen, the patient denies any dyspnea on exertion, orthopnea, PND, palpitations, syncope or chest pain. Minimal pedal edema.    Current Outpatient Prescriptions  Medication Sig Dispense Refill  . ACCU-CHEK FASTCLIX LANCETS MISC USE TO CHECK BLOOD SUGAR FOUR TIMES DAILY  102 each  11  . ACCU-CHEK SMARTVIEW test strip USE TO CHECK BLOOD SUGAR FOUR TIMES DAILY AS DIRECTED  100 each  11  . aspirin EC 325 MG EC tablet Take 1 tablet (325 mg total) by mouth daily.  30 tablet  0  . carvedilol (COREG) 3.125 MG tablet Take 1 tablet (3.125 mg total) by mouth 2 (two) times daily with a meal.  60 tablet  3  . furosemide (LASIX) 20 MG tablet Take 1 tablet (20 mg total) by mouth daily.      . hydrALAZINE (APRESOLINE) 25 MG tablet Take 1 tablet (25 mg total) by mouth 3 (three) times daily.  90 tablet  5  . metFORMIN (GLUCOPHAGE) 500 MG tablet Take 1 tablet (500 mg total) by mouth 2 (two) times daily with a meal.  180 tablet  3  . Multiple Vitamin (MULTIVITAMIN) tablet Take 1 tablet by mouth daily.      . Omega-3 Fatty Acids (FISH OIL) 1000  MG CAPS Take 1 each by mouth daily.      . pravastatin (PRAVACHOL) 40 MG tablet Take 1 tablet (40 mg total) by mouth every evening.  30 tablet  11   No current facility-administered medications for this visit.     Past Medical History  Diagnosis Date  . Positive TB test   . Bladder tumor   . COPD (chronic obstructive pulmonary disease)   . Diabetes mellitus TYPE II--  PER PCP NOTE DR Oliver Barre    PER PT DOES NOT TAKES METFORMIN PRESCRIBED, SHE WATCHES DIET AND EXERCISES  . Cancer     bladder  . H/O hiatal hernia     s/p  72 yrs old  . Chronic systolic CHF (congestive heart failure) 08/04/2012    Echo (08/17/12): EF 30%, diffuse HK, mild MR, mild BAE, PASP.  . Ischemic cardiomyopathy   . CAD (coronary artery disease)     a. LHC (10/07/12):  Ostial LM 60%, distal LM 40-50%, oLAD 40-50%, mLAD 70-80%, pCFX subtotally occluded, mCFX 80-90%, oRCA occluded, ostial L subclavian 40%, EF 25-30%, abdominal aorta diffusely diseased with severe ulcerated plaquing (right iliac ostial 95%; left renal long 60-70%, right okay). CO 2.1, CI 1.5 => s/p CABG (L-LAD, S-OM, S-PDA) 10/2012 with Dr. Laneta Simmers   . Bilateral carotid artery disease 09/26/2012    carotid US (8/14):  R 80-99; L 60-79 => s/p R CEA  10/2012 (Dr. Arbie Cookey)  . Lung nodule     chest CT 09/2012 => needs repeat by 03/2013  . PAD (peripheral artery disease)   . Hyperlipidemia   . Hyperkalemia     ACEI d/c'd 11/2012    Past Surgical History  Procedure Laterality Date  . Tonsillectomy  1959  . Hiatal hernia repair  1964  (AGE 72)    CHOLECYSTECTOMY AND APPENDECTOMY  . Left elbow surgery    . Lumbar disc surgery  1993  . Abdominal hysterectomy  1981 (APPROX)    BILATERAL SALPINGO-OOPHORECTOMY WITH EXCEPTION A SMALL PIECE OF OVARY REMAINS  . Transurethral resection of bladder tumor N/A 06/24/2012    Procedure: TRANSURETHRAL RESECTION OF BLADDER TUMOR  WITH GYRUS AND INSTILLATION OF MITOMYCIN C  (TURBT);  Surgeon: Garnett Farm, MD;  Location:  [facility];  Service: Urology;  Laterality: N/A;  . Appendectomy    . Wrist surgery Left   . Cholecystectomy    . Tonsillectomy    . Cardiac catheterization    . Hammer toe surgery Left   . Coronary artery bypass graft N/A 11/14/2012    Procedure: CORONARY ARTERY BYPASS GRAFTING (CABG) times three using left internal mammary artery and left saphenous vein;  Surgeon: Alleen Borne, MD;  Location: [facility] OR;  Service: Open Heart Surgery;  Laterality: N/A;  . Intraoperative transesophageal echocardiogram N/A 11/14/2012    Procedure: INTRAOPERATIVE TRANSESOPHAGEAL ECHOCARDIOGRAM;  Surgeon: Alleen Borne, MD;  Location: [facility] OR;  Service: Open Heart Surgery;  Laterality: N/A;  . Endarterectomy Right 11/14/2012    Procedure: RIGHT ENDARTERECTOMY CAROTID with patch angioplasty.;  Surgeon: Larina Earthly, MD;  Location: [facility] OR;  Service: Vascular;  Laterality: Right;    History   Social History  . Marital Status: Widowed    Spouse Name: N/A    Number of Children: 2  . Years of Education: 16   Occupational History  . RN, BSN    Social History Main Topics  . Smoking status: Former Smoker -- 0.20 packs/day for 50 years    Types: Cigarettes    Quit date: 11/13/2012  . Smokeless tobacco: Never Used     Comment: PT HAS BEEN QUITTING--  SMOKES 4 CIG'S PER DAY,  DOWN FROM 1PPD  . Alcohol Use: No     Comment: occasional wine not often  . Drug Use: No  . Sexual Activity: Not on file   Other Topics Concern  . Not on file   Social History Narrative  . No narrative on file    ROS: no fevers or chills, productive cough, hemoptysis, dysphasia, odynophagia, melena, hematochezia, dysuria, hematuria, rash, seizure activity, orthopnea, PND, claudication. Remaining systems are negative.  Physical Exam: Well-developed well-nourished in no acute distress.  Skin is warm and dry.  HEENT is normal.  Neck is supple. Bilateral bruits. Status post right carotid endarterectomy. Chest is clear to  auscultation with normal expansion. Previous sternotomy Cardiovascular exam is regular rate and rhythm. 2/6 systolic ejection murmur Abdominal exam nontender or distended. No masses palpated. Extremities show 1+ ankle edema. neuro grossly intact

## 2013-01-23 NOTE — Assessment & Plan Note (Signed)
Patient states she has an appointment with primary care to followup on this in February.

## 2013-01-23 NOTE — Patient Instructions (Addendum)
Your physician recommends that you schedule a follow-up appointment in: 3 months with Dr Jens Som.--Scheduled for March 9,2015 at 8:00  START ON IMDUR 30MG  A DAY.

## 2013-02-14 ENCOUNTER — Ambulatory Visit (HOSPITAL_COMMUNITY): Payer: Medicare Other | Attending: Cardiology | Admitting: Radiology

## 2013-02-14 ENCOUNTER — Encounter: Payer: Self-pay | Admitting: Physician Assistant

## 2013-02-14 ENCOUNTER — Encounter: Payer: Self-pay | Admitting: Cardiology

## 2013-02-14 DIAGNOSIS — I2510 Atherosclerotic heart disease of native coronary artery without angina pectoris: Secondary | ICD-10-CM

## 2013-02-14 DIAGNOSIS — I679 Cerebrovascular disease, unspecified: Secondary | ICD-10-CM | POA: Insufficient documentation

## 2013-02-14 DIAGNOSIS — I509 Heart failure, unspecified: Secondary | ICD-10-CM | POA: Insufficient documentation

## 2013-02-14 DIAGNOSIS — J449 Chronic obstructive pulmonary disease, unspecified: Secondary | ICD-10-CM | POA: Insufficient documentation

## 2013-02-14 DIAGNOSIS — I059 Rheumatic mitral valve disease, unspecified: Secondary | ICD-10-CM | POA: Insufficient documentation

## 2013-02-14 DIAGNOSIS — J4489 Other specified chronic obstructive pulmonary disease: Secondary | ICD-10-CM | POA: Insufficient documentation

## 2013-02-14 DIAGNOSIS — I2589 Other forms of chronic ischemic heart disease: Secondary | ICD-10-CM | POA: Insufficient documentation

## 2013-02-14 DIAGNOSIS — C679 Malignant neoplasm of bladder, unspecified: Secondary | ICD-10-CM | POA: Insufficient documentation

## 2013-02-14 DIAGNOSIS — I5022 Chronic systolic (congestive) heart failure: Secondary | ICD-10-CM

## 2013-02-14 NOTE — Progress Notes (Signed)
Echocardiogram performed.  

## 2013-03-21 ENCOUNTER — Other Ambulatory Visit: Payer: Self-pay

## 2013-03-21 DIAGNOSIS — I5022 Chronic systolic (congestive) heart failure: Secondary | ICD-10-CM

## 2013-03-21 MED ORDER — METFORMIN HCL 500 MG PO TABS: 500.0000 mg | ORAL_TABLET | Freq: Two times a day (BID) | ORAL | Status: DC

## 2013-03-21 MED ORDER — FUROSEMIDE 20 MG PO TABS: 20.0000 mg | ORAL_TABLET | Freq: Every day | ORAL | Status: DC

## 2013-03-21 MED ORDER — ACCU-CHEK FASTCLIX LANCETS MISC: Status: DC

## 2013-03-21 MED ORDER — GLUCOSE BLOOD VI STRP: ORAL_STRIP | Status: DC

## 2013-03-21 NOTE — Addendum Note (Signed)
Addended by: Sharon Seller B on: 03/21/2013 11:29 AM   Modules accepted: Orders

## 2013-04-04 ENCOUNTER — Other Ambulatory Visit: Payer: Self-pay

## 2013-04-04 DIAGNOSIS — I509 Heart failure, unspecified: Secondary | ICD-10-CM

## 2013-04-04 DIAGNOSIS — I2510 Atherosclerotic heart disease of native coronary artery without angina pectoris: Secondary | ICD-10-CM

## 2013-04-04 MED ORDER — HYDRALAZINE HCL 25 MG PO TABS: 25.0000 mg | ORAL_TABLET | Freq: Three times a day (TID) | ORAL | Status: DC

## 2013-04-04 MED ORDER — PRAVASTATIN SODIUM 40 MG PO TABS: 40.0000 mg | ORAL_TABLET | Freq: Every evening | ORAL | Status: DC

## 2013-04-04 MED ORDER — CARVEDILOL 3.125 MG PO TABS: 3.1250 mg | ORAL_TABLET | Freq: Two times a day (BID) | ORAL | Status: DC

## 2013-04-04 MED ORDER — ISOSORBIDE MONONITRATE ER 30 MG PO TB24: 30.0000 mg | ORAL_TABLET | Freq: Every day | ORAL | Status: DC

## 2013-04-07 ENCOUNTER — Other Ambulatory Visit: Payer: Self-pay

## 2013-04-07 ENCOUNTER — Telehealth: Payer: Self-pay

## 2013-04-07 MED ORDER — CARVEDILOL 3.125 MG PO TABS: 3.1250 mg | ORAL_TABLET | Freq: Two times a day (BID) | ORAL | Status: DC

## 2013-04-07 NOTE — Telephone Encounter (Signed)
Refill

## 2013-04-11 ENCOUNTER — Ambulatory Visit: Payer: Medicare Other | Admitting: Internal Medicine

## 2013-04-17 ENCOUNTER — Telehealth: Payer: Self-pay | Admitting: Cardiology

## 2013-04-17 MED ORDER — CARVEDILOL 6.25 MG PO TABS: 6.2500 mg | ORAL_TABLET | Freq: Two times a day (BID) | ORAL | Status: DC

## 2013-04-17 NOTE — Telephone Encounter (Signed)
Attempted to contact pt to review medication with her.  No documentation found increasing carvedilol.  Need to know who and when it was increased.

## 2013-04-17 NOTE — Telephone Encounter (Signed)
After review - according to documentation from 11/10/2012 pt was increased to 6.25 mg BID by Dr Stanford Breed.  New Rx sent into pharmacy.

## 2013-04-17 NOTE — Telephone Encounter (Signed)
New message    Patient takes cardevilol 6.25 mg bid---new pres was for 3.125 bid.  Is she supposed to be taking the new dosage?

## 2013-04-18 ENCOUNTER — Ambulatory Visit: Payer: Medicare Other | Admitting: Internal Medicine

## 2013-04-24 ENCOUNTER — Other Ambulatory Visit: Payer: Self-pay | Admitting: *Deleted

## 2013-04-24 DIAGNOSIS — I509 Heart failure, unspecified: Secondary | ICD-10-CM

## 2013-04-24 DIAGNOSIS — I2510 Atherosclerotic heart disease of native coronary artery without angina pectoris: Secondary | ICD-10-CM

## 2013-04-24 MED ORDER — PRAVASTATIN SODIUM 40 MG PO TABS: 40.0000 mg | ORAL_TABLET | Freq: Every evening | ORAL | Status: DC

## 2013-04-24 MED ORDER — HYDRALAZINE HCL 25 MG PO TABS: 25.0000 mg | ORAL_TABLET | Freq: Three times a day (TID) | ORAL | Status: DC

## 2013-04-24 MED ORDER — ISOSORBIDE MONONITRATE ER 30 MG PO TB24: 30.0000 mg | ORAL_TABLET | Freq: Every day | ORAL | Status: DC

## 2013-04-26 ENCOUNTER — Ambulatory Visit (INDEPENDENT_AMBULATORY_CARE_PROVIDER_SITE_OTHER): Payer: Medicare HMO | Admitting: Internal Medicine

## 2013-04-26 ENCOUNTER — Encounter: Payer: Self-pay | Admitting: Internal Medicine

## 2013-04-26 VITALS — BP 150/80 | HR 86 | Temp 99.3°F | Wt 107.2 lb

## 2013-04-26 DIAGNOSIS — I5022 Chronic systolic (congestive) heart failure: Secondary | ICD-10-CM | POA: Insufficient documentation

## 2013-04-26 DIAGNOSIS — C679 Malignant neoplasm of bladder, unspecified: Secondary | ICD-10-CM

## 2013-04-26 DIAGNOSIS — Z0000 Encounter for general adult medical examination without abnormal findings: Secondary | ICD-10-CM

## 2013-04-26 DIAGNOSIS — Z23 Encounter for immunization: Secondary | ICD-10-CM

## 2013-04-26 HISTORY — DX: Malignant neoplasm of bladder, unspecified: C67.9

## 2013-04-26 MED ORDER — FUROSEMIDE 20 MG PO TABS: 20.0000 mg | ORAL_TABLET | Freq: Every day | ORAL | Status: DC

## 2013-04-26 NOTE — Progress Notes (Signed)
Pre visit review using our clinic review tool, if applicable. No additional management support is needed unless otherwise documented below in the visit note. 

## 2013-04-26 NOTE — Addendum Note (Signed)
Addended by: Sharon Seller B on: 04/26/2013 04:29 PM   Modules accepted: Orders

## 2013-04-26 NOTE — Assessment & Plan Note (Signed)
stable overall by history and exam, and pt to continue medical treatment as before,  to f/u any worsening symptoms or concerns 

## 2013-04-26 NOTE — Assessment & Plan Note (Signed)

## 2013-04-26 NOTE — Patient Instructions (Addendum)
You had the new Prevnar pneumonia shot today Please continue all other medications as before, and refills have been done if requested. Please have the pharmacy call with any other refills you may need. Please continue your efforts at being more active, low cholesterol diet, and weight control. You are otherwise up to date with prevention measures today.  You will be contacted regarding the referral for: mammogram  No further lab work needed today  Please keep your appointments with your specialists as you have planned  Please return in 6 months, or sooner if needed

## 2013-04-26 NOTE — Progress Notes (Signed)
Subjective:    Patient ID: Tonya Valdez, female    DOB: 26-Nov-1940, 73 y.o.   MRN: [id number]  HPI  Here for wellness and f/u;  Overall doing ok;  Pt denies CP, worsening SOB, DOE, wheezing, orthopnea, PND, worsening LE edema, palpitations, dizziness or syncope.  Pt denies neurological change such as new headache, facial or extremity weakness.  Pt denies polydipsia, polyuria, or low sugar symptoms. Pt states overall good compliance with treatment and medications, good tolerability, and has been trying to follow lower cholesterol diet.  Pt denies worsening depressive symptoms, suicidal ideation or panic. No fever, night sweats, wt loss, loss of appetite, or other constitutional symptoms.  Pt states good ability with ADL's, has low fall risk, home safety reviewed and adequate, no other significant changes in hearing or vision, and only occasionally active with exercise.  S/p cabg sept 2014 with f/u echo - no change in meds.  Has f/u appt with card next Monday. Also sees Dr Karsten Ro for q 3 mo cyst for 1 yr s/p BCG in dec 2014.   No acute complaints Past Medical History  Diagnosis Date  . Positive TB test   . Bladder tumor   . COPD (chronic obstructive pulmonary disease)   . Diabetes mellitus TYPE II--  PER PCP NOTE DR Cathlean Cower    PER PT DOES NOT TAKES METFORMIN PRESCRIBED, SHE WATCHES DIET AND EXERCISES  . Cancer     bladder  . H/O hiatal hernia     s/p  73 yrs old  . Chronic systolic CHF (congestive heart failure) 08/04/2012    Echo (08/17/12): EF 30%, diffuse HK, mild MR, mild BAE, PASP.;  Echo (01/2013): EF 40-45%, inferior posterior HK, Gr 2 DD, mild MR, mild LAE  . Ischemic cardiomyopathy   . CAD (coronary artery disease)     a. LHC (10/07/12):  Ostial LM 60%, distal LM 40-50%, oLAD 40-50%, mLAD 70-80%, pCFX subtotally occluded, mCFX 80-90%, oRCA occluded, ostial L subclavian 40%, EF 25-30%, abdominal aorta diffusely diseased with severe ulcerated plaquing (right iliac ostial 95%; left renal  long 60-70%, right okay). CO 2.1, CI 1.5 => s/p CABG (L-LAD, S-OM, S-PDA) 10/2012 with Dr. Cyndia Bent   . Bilateral carotid artery disease 09/26/2012    carotid US (8/14):  R 80-99; L 60-79 => s/p R CEA 10/2012 (Dr. Donnetta Hutching)  . Lung nodule     chest CT 09/2012 => needs repeat by 03/2013  . PAD (peripheral artery disease)   . Hyperlipidemia   . Hyperkalemia     ACEI d/c'd 11/2012   Past Surgical History  Procedure Laterality Date  . Tonsillectomy  1959  . Hiatal hernia repair  1964  (AGE 35)    CHOLECYSTECTOMY AND APPENDECTOMY  . Left elbow surgery    . Lumbar disc surgery  1993  . Abdominal hysterectomy  1981 (APPROX)    BILATERAL SALPINGO-OOPHORECTOMY WITH EXCEPTION A SMALL PIECE OF OVARY REMAINS  . Transurethral resection of bladder tumor N/A 06/24/2012    Procedure: TRANSURETHRAL RESECTION OF BLADDER TUMOR  WITH GYRUS AND INSTILLATION OF MITOMYCIN C  (TURBT);  Surgeon: Claybon Jabs, MD;  Location: [facility];  Service: Urology;  Laterality: N/A;  . Appendectomy    . Wrist surgery Left   . Cholecystectomy    . Tonsillectomy    . Cardiac catheterization    . Hammer toe surgery Left   . Coronary artery bypass graft N/A 11/14/2012    Procedure: CORONARY ARTERY BYPASS GRAFTING (  CABG) times three using left internal mammary artery and left saphenous vein;  Surgeon: Gaye Pollack, MD;  Location: [facility];  Service: Open Heart Surgery;  Laterality: N/A;  . Intraoperative transesophageal echocardiogram N/A 11/14/2012    Procedure: INTRAOPERATIVE TRANSESOPHAGEAL ECHOCARDIOGRAM;  Surgeon: Gaye Pollack, MD;  Location: [facility] OR;  Service: Open Heart Surgery;  Laterality: N/A;  . Endarterectomy Right 11/14/2012    Procedure: RIGHT ENDARTERECTOMY CAROTID with patch angioplasty.;  Surgeon: Rosetta Posner, MD;  Location: [place];  Service: Vascular;  Laterality: Right;    reports that she quit smoking about 5 months ago. Her smoking use included Cigarettes. She has a 10 pack-year smoking history. She  has never used smokeless tobacco. She reports that she does not drink alcohol or use illicit drugs. family history includes Cancer in her father, mother, and other; Cervical cancer in her mother; Colon cancer in her father; Diabetes in her mother and other; Heart disease in her mother; Hypertension in her father, mother, and other; Kidney disease in her other; Lung cancer (age of onset: 12) in her father. No Known Allergies Current Outpatient Prescriptions on File Prior to Visit  Medication Sig Dispense Refill  . ACCU-CHEK FASTCLIX LANCETS MISC Use as directed four times daily to check blood sugar.  Diagnosis code 250.02  306 each  3  . aspirin EC 325 MG EC tablet Take 1 tablet (325 mg total) by mouth daily.  30 tablet  0  . carvedilol (COREG) 6.25 MG tablet Take 1 tablet (6.25 mg total) by mouth 2 (two) times daily with a meal.  180 tablet  3  . glucose blood (ACCU-CHEK SMARTVIEW) test strip Use as directed four times daily to check blood sugar.  Diagnosis code 250.02  300 each  3  . hydrALAZINE (APRESOLINE) 25 MG tablet Take 1 tablet (25 mg total) by mouth 3 (three) times daily.  270 tablet  1  . isosorbide mononitrate (IMDUR) 30 MG 24 hr tablet Take 1 tablet (30 mg total) by mouth daily.  90 tablet  1  . metFORMIN (GLUCOPHAGE) 500 MG tablet Take 1 tablet (500 mg total) by mouth 2 (two) times daily with a meal.  180 tablet  3  . Multiple Vitamin (MULTIVITAMIN) tablet Take 1 tablet by mouth daily.      . Omega-3 Fatty Acids (FISH OIL) 1000 MG CAPS Take 1 each by mouth daily.      . pravastatin (PRAVACHOL) 40 MG tablet Take 1 tablet (40 mg total) by mouth every evening.  90 tablet  1   No current facility-administered medications on file prior to visit.    Review of Systems Constitutional: Negative for diaphoresis, activity change, appetite change or unexpected weight change.  HENT: Negative for hearing loss, ear pain, facial swelling, mouth sores and neck stiffness.   Eyes: Negative for pain,  redness and visual disturbance.  Respiratory: Negative for shortness of breath and wheezing.   Cardiovascular: Negative for chest pain and palpitations.  Gastrointestinal: Negative for diarrhea, blood in stool, abdominal distention or other pain Genitourinary: Negative for hematuria, flank pain or change in urine volume.  Musculoskeletal: Negative for myalgias and joint swelling.  Skin: Negative for color change and wound.  Neurological: Negative for syncope and numbness. other than noted Hematological: Negative for adenopathy.  Psychiatric/Behavioral: Negative for hallucinations, self-injury, decreased concentration and agitation.      Objective:   Physical Exam BP 150/80  Pulse 86  Temp(Src) 99.3 F (37.4 C) (Oral)  Wt 107  lb 4 oz (48.648 kg)  SpO2 95% VS noted,  Constitutional: Pt is oriented to person, place, and time. Appears well-developed and well-nourished. Lennie Hummer Head: Normocephalic and atraumatic.  Right Ear: External ear normal.  Left Ear: External ear normal.  Nose: Nose normal.  Mouth/Throat: Oropharynx is clear and moist.  Eyes: Conjunctivae and EOM are normal. Pupils are equal, round, and reactive to light.  Neck: Normal range of motion. Neck supple. No JVD present. No tracheal deviation present.  Cardiovascular: Normal rate, regular rhythm, normal heart sounds and intact distal pulses.   Pulmonary/Chest: Effort normal and breath sounds normal.  Abdominal: Soft. Bowel sounds are normal. There is no tenderness. No HSM  Musculoskeletal: Normal range of motion. Exhibits no edema.  Lymphadenopathy:  Has no cervical adenopathy.  Neurological: Pt is alert and oriented to person, place, and time. Pt has normal reflexes. No cranial nerve deficit.  Skin: Skin is warm and dry. No rash noted.  Psychiatric:  Has  normal mood and affect. Behavior is normal.     Assessment & Plan:

## 2013-05-01 ENCOUNTER — Ambulatory Visit (INDEPENDENT_AMBULATORY_CARE_PROVIDER_SITE_OTHER): Payer: Commercial Managed Care - HMO | Admitting: Cardiology

## 2013-05-01 ENCOUNTER — Encounter: Payer: Self-pay | Admitting: Cardiology

## 2013-05-01 VITALS — BP 180/76 | HR 73 | Ht 62.0 in | Wt 108.0 lb

## 2013-05-01 DIAGNOSIS — I5022 Chronic systolic (congestive) heart failure: Secondary | ICD-10-CM

## 2013-05-01 DIAGNOSIS — I739 Peripheral vascular disease, unspecified: Secondary | ICD-10-CM

## 2013-05-01 DIAGNOSIS — I10 Essential (primary) hypertension: Secondary | ICD-10-CM | POA: Insufficient documentation

## 2013-05-01 DIAGNOSIS — I2510 Atherosclerotic heart disease of native coronary artery without angina pectoris: Secondary | ICD-10-CM

## 2013-05-01 DIAGNOSIS — I779 Disorder of arteries and arterioles, unspecified: Secondary | ICD-10-CM

## 2013-05-01 DIAGNOSIS — R911 Solitary pulmonary nodule: Secondary | ICD-10-CM

## 2013-05-01 MED ORDER — CARVEDILOL 12.5 MG PO TABS: 12.5000 mg | ORAL_TABLET | Freq: Two times a day (BID) | ORAL | Status: DC

## 2013-05-01 NOTE — Patient Instructions (Signed)
Your physician wants you to follow-up in: [place] will receive a reminder letter in the mail two months in advance. If you don't receive a letter, please call our office to schedule the follow-up appointment.   INCREASE CARVEDILOL TO 12.5 MG TWICE DAILY  CT OF CHEST WITHOUT CONTRAST TO F/U LUNG NODULE

## 2013-05-01 NOTE — Assessment & Plan Note (Signed)
Continue present dose of Lasix. 

## 2013-05-01 NOTE — Assessment & Plan Note (Signed)
Continue aspirin and statin. Followed by vascular surgery. 

## 2013-05-01 NOTE — Assessment & Plan Note (Signed)
Blood pressure elevated. Increase Coreg to 12.5 mg by mouth twice a day. Follow blood pressure at home and adjust medications as needed.

## 2013-05-01 NOTE — Assessment & Plan Note (Signed)
Developed hyperkalemia with ACE inhibitor. Continue hydralazine nitrates. Increase Coreg to 12.5 mg by mouth twice a day.

## 2013-05-01 NOTE — Assessment & Plan Note (Signed)
Continue aspirin and statin. 

## 2013-05-01 NOTE — Progress Notes (Signed)
HPI: FU CAD. Echo (08/17/12): EF 30%, diffuse HK, mild MR, mild BAE, PASP. Carotid US (4/13): RICA 24-40%; LICA 10-27%. Abdominal US (8/14): No AAA, severe bilateral CIA stenosis noted. She was referred to vascular surgery for possible CEA and evaluation of PAD. LHC (10/07/12): oLM 60%, dLM 40-50%, oLAD 40-50%, mLAD 70-80%, pCFX subtotally occluded, mCFX 80-90%, oRCA occluded, ostial L subclavian 40%, EF 25-30%, abdominal aorta diffusely diseased with severe ulcerated plaquing (right iliac ostial 95%; left renal long 60-70%, right okay). CO 2.1, CI 1.5. Chest CT (8/14): Posterior RLL 78mm nodule, posterior LLL 7 mm nodule, other small nodules in upper lung zones; f/u rec in 4-6 mos. Pre-CABG ABIs: normal. She underwent CABG 11/14/12 (LIMA-LAD, SVG-OM, SVG-PDA) along with right CEA. Developed hyperkalemia with ACEI. Echocardiogram repeated December 2014 and revealed an ejection fraction of 40-45%. There was grade 2 diastolic dysfunction. There was mild left atrial enlargement and mild mitral regurgitation. I last saw her in December of 2014. Since then, the patient has dyspnea with more extreme activities but not with routine activities. It is relieved with rest. It is not associated with chest pain. There is no orthopnea, PND or pedal edema. There is no syncope or palpitations. There is no exertional chest pain.    Current Outpatient Prescriptions  Medication Sig Dispense Refill  . ACCU-CHEK FASTCLIX LANCETS MISC Use as directed four times daily to check blood sugar.  Diagnosis code 250.02  306 each  3  . aspirin EC 325 MG EC tablet Take 1 tablet (325 mg total) by mouth daily.  30 tablet  0  . carvedilol (COREG) 6.25 MG tablet Take 1 tablet (6.25 mg total) by mouth 2 (two) times daily with a meal.  180 tablet  3  . furosemide (LASIX) 20 MG tablet Take 1 tablet (20 mg total) by mouth daily.  90 tablet  3  . glucose blood (ACCU-CHEK SMARTVIEW) test strip Use as directed four times daily to check blood  sugar.  Diagnosis code 250.02  300 each  3  . hydrALAZINE (APRESOLINE) 25 MG tablet Take 1 tablet (25 mg total) by mouth 3 (three) times daily.  270 tablet  1  . isosorbide mononitrate (IMDUR) 30 MG 24 hr tablet Take 1 tablet (30 mg total) by mouth daily.  90 tablet  1  . metFORMIN (GLUCOPHAGE) 500 MG tablet Take 1 tablet (500 mg total) by mouth 2 (two) times daily with a meal.  180 tablet  3  . Multiple Vitamin (MULTIVITAMIN) tablet Take 1 tablet by mouth daily.      . Omega-3 Fatty Acids (FISH OIL) 1000 MG CAPS Take 1 each by mouth daily.      . pravastatin (PRAVACHOL) 40 MG tablet Take 1 tablet (40 mg total) by mouth every evening.  90 tablet  1   No current facility-administered medications for this visit.     Past Medical History  Diagnosis Date  . Positive TB test   . Bladder tumor   . COPD (chronic obstructive pulmonary disease)   . Diabetes mellitus TYPE II--  PER PCP NOTE DR Cathlean Cower    PER PT DOES NOT TAKES METFORMIN PRESCRIBED, SHE WATCHES DIET AND EXERCISES  . Cancer     bladder  . H/O hiatal hernia     s/p  73 yrs old  . Chronic systolic CHF (congestive heart failure) 08/04/2012    Echo (08/17/12): EF 30%, diffuse HK, mild MR, mild BAE, PASP.;  Echo (01/2013): EF 40-45%,  inferior posterior HK, Gr 2 DD, mild MR, mild LAE  . Ischemic cardiomyopathy   . CAD (coronary artery disease)     a. LHC (10/07/12):  Ostial LM 60%, distal LM 40-50%, oLAD 40-50%, mLAD 70-80%, pCFX subtotally occluded, mCFX 80-90%, oRCA occluded, ostial L subclavian 40%, EF 25-30%, abdominal aorta diffusely diseased with severe ulcerated plaquing (right iliac ostial 95%; left renal long 60-70%, right okay). CO 2.1, CI 1.5 => s/p CABG (L-LAD, S-OM, S-PDA) 10/2012 with Dr. Cyndia Bent   . Bilateral carotid artery disease 09/26/2012    carotid US (8/14):  R 80-99; L 60-79 => s/p R CEA 10/2012 (Dr. Donnetta Hutching)  . Lung nodule     chest CT 09/2012 => needs repeat by 03/2013  . PAD (peripheral artery disease)   .  Hyperlipidemia   . Hyperkalemia     ACEI d/c'd 11/2012  . Bladder cancer 04/26/2013    Past Surgical History  Procedure Laterality Date  . Tonsillectomy  1959  . Hiatal hernia repair  1964  (AGE 27)    CHOLECYSTECTOMY AND APPENDECTOMY  . Left elbow surgery    . Lumbar disc surgery  1993  . Abdominal hysterectomy  1981 (APPROX)    BILATERAL SALPINGO-OOPHORECTOMY WITH EXCEPTION A SMALL PIECE OF OVARY REMAINS  . Transurethral resection of bladder tumor N/A 06/24/2012    Procedure: TRANSURETHRAL RESECTION OF BLADDER TUMOR  WITH GYRUS AND INSTILLATION OF MITOMYCIN C  (TURBT);  Surgeon: Claybon Jabs, MD;  Location: [facility];  Service: Urology;  Laterality: N/A;  . Appendectomy    . Wrist surgery Left   . Cholecystectomy    . Tonsillectomy    . Cardiac catheterization    . Hammer toe surgery Left   . Coronary artery bypass graft N/A 11/14/2012    Procedure: CORONARY ARTERY BYPASS GRAFTING (CABG) times three using left internal mammary artery and left saphenous vein;  Surgeon: Gaye Pollack, MD;  Location: [place] [facility];  Service: Open Heart Surgery;  Laterality: N/A;  . Intraoperative transesophageal echocardiogram N/A 11/14/2012    Procedure: INTRAOPERATIVE TRANSESOPHAGEAL ECHOCARDIOGRAM;  Surgeon: Gaye Pollack, MD;  Location: [facility] OR;  Service: Open Heart Surgery;  Laterality: N/A;  . Endarterectomy Right 11/14/2012    Procedure: RIGHT ENDARTERECTOMY CAROTID with patch angioplasty.;  Surgeon: Rosetta Posner, MD;  Location: [place];  Service: Vascular;  Laterality: Right;    History   Social History  . Marital Status: Widowed    Spouse Name: N/A    Number of Children: 2  . Years of Education: 16   Occupational History  . RN, BSN    Social History Main Topics  . Smoking status: Former Smoker -- 0.20 packs/day for 50 years    Types: Cigarettes    Quit date: 11/13/2012  . Smokeless tobacco: Never Used     Comment: PT HAS BEEN QUITTING--  SMOKES 4 CIG'S PER DAY,  DOWN FROM 1PPD   . Alcohol Use: No     Comment: occasional wine not often  . Drug Use: No  . Sexual Activity: Not on file   Other Topics Concern  . Not on file   Social History Narrative  . No narrative on file    ROS: no fevers or chills, productive cough, hemoptysis, dysphasia, odynophagia, melena, hematochezia, dysuria, hematuria, rash, seizure activity, orthopnea, PND, pedal edema, claudication. Remaining systems are negative.  Physical Exam: Well-developed well-nourished in no acute distress.  Skin is warm and dry.  HEENT is normal.  Neck  is supple.  Chest is clear to auscultation with normal expansion.  Cardiovascular exam is regular rate and rhythm.  Abdominal exam nontender or distended. No masses palpated. Extremities show no edema. neuro grossly intact  ECG sinus rhythm at a rate of 73. Nonspecific ST changes.

## 2013-05-01 NOTE — Assessment & Plan Note (Signed)
Will schedule followup CT scan.

## 2013-05-04 ENCOUNTER — Telehealth: Payer: Self-pay | Admitting: *Deleted

## 2013-05-04 DIAGNOSIS — I5022 Chronic systolic (congestive) heart failure: Secondary | ICD-10-CM

## 2013-05-04 MED ORDER — FUROSEMIDE 20 MG PO TABS: 20.0000 mg | ORAL_TABLET | Freq: Every day | ORAL | Status: DC

## 2013-05-04 NOTE — Telephone Encounter (Signed)
[facility] pharmacy phoned stating they had not received patient's lasix script.  Resubmitted via erx.

## 2013-05-09 ENCOUNTER — Ambulatory Visit (INDEPENDENT_AMBULATORY_CARE_PROVIDER_SITE_OTHER)
Admission: RE | Admit: 2013-05-09 | Discharge: 2013-05-09 | Disposition: A | Discharge status: Home / Self Care | Payer: Medicare HMO | Source: Ambulatory Visit | Attending: Cardiology | Admitting: Cardiology

## 2013-05-09 DIAGNOSIS — R911 Solitary pulmonary nodule: Secondary | ICD-10-CM

## 2013-05-22 ENCOUNTER — Other Ambulatory Visit: Payer: Self-pay | Admitting: Internal Medicine

## 2013-05-22 ENCOUNTER — Ambulatory Visit
Admission: RE | Admit: 2013-05-22 | Discharge: 2013-05-22 | Disposition: A | Discharge status: Home / Self Care | Payer: Commercial Managed Care - HMO | Source: Ambulatory Visit | Attending: Internal Medicine | Admitting: Internal Medicine

## 2013-05-22 DIAGNOSIS — Z1231 Encounter for screening mammogram for malignant neoplasm of breast: Secondary | ICD-10-CM

## 2013-05-22 DIAGNOSIS — Z0000 Encounter for general adult medical examination without abnormal findings: Secondary | ICD-10-CM

## 2013-06-06 ENCOUNTER — Other Ambulatory Visit (HOSPITAL_COMMUNITY): Payer: Medicare Other

## 2013-06-06 ENCOUNTER — Ambulatory Visit: Payer: Medicare Other | Admitting: Vascular Surgery

## 2013-06-12 ENCOUNTER — Encounter: Payer: Self-pay | Admitting: Vascular Surgery

## 2013-06-13 ENCOUNTER — Ambulatory Visit (HOSPITAL_COMMUNITY)
Admission: RE | Admit: 2013-06-13 | Discharge: 2013-06-13 | Disposition: A | Discharge status: Home / Self Care | Payer: Medicare HMO | Source: Ambulatory Visit | Attending: Vascular Surgery | Admitting: Vascular Surgery

## 2013-06-13 ENCOUNTER — Encounter: Payer: Self-pay | Admitting: Vascular Surgery

## 2013-06-13 ENCOUNTER — Ambulatory Visit (INDEPENDENT_AMBULATORY_CARE_PROVIDER_SITE_OTHER): Payer: Commercial Managed Care - HMO | Admitting: Vascular Surgery

## 2013-06-13 VITALS — BP 163/119 | HR 63 | Ht 62.0 in | Wt 111.0 lb

## 2013-06-13 DIAGNOSIS — I658 Occlusion and stenosis of other precerebral arteries: Secondary | ICD-10-CM | POA: Insufficient documentation

## 2013-06-13 DIAGNOSIS — Z48812 Encounter for surgical aftercare following surgery on the circulatory system: Secondary | ICD-10-CM

## 2013-06-13 DIAGNOSIS — I6529 Occlusion and stenosis of unspecified carotid artery: Secondary | ICD-10-CM

## 2013-06-13 NOTE — Progress Notes (Signed)
[facility] [place] HISTORY AND PHYSICAL   CC:  Follow up carotid duplex scan  Referring Provider:  Biagio Borg, MD  HPI: This is a 73 y.o. female who has known carotid stenosis is here for f/u carotid duplex scan.  Denies amaurosis fugax, paresthesias, or hemiparesis.  She is s/p combined right CEA/CABG on 11/14/12.  She has done quite well since then.  She is on a beta blocker for her HTN and a statin for her hypercholesterolemia.  She is also on Metformin for DM.  She returns today for repeat carotid duplex.  Past Medical History  Diagnosis Date  . Positive TB test   . Bladder tumor   . COPD (chronic obstructive pulmonary disease)   . Diabetes mellitus TYPE II--  PER PCP NOTE DR Cathlean Cower    PER PT DOES NOT TAKES METFORMIN PRESCRIBED, SHE WATCHES DIET AND EXERCISES  . Cancer     bladder  . H/O hiatal hernia     s/p  73 yrs old  . Chronic systolic CHF (congestive heart failure) 08/04/2012    Echo (08/17/12): EF 30%, diffuse HK, mild MR, mild BAE, PASP.;  Echo (01/2013): EF 40-45%, inferior posterior HK, Gr 2 DD, mild MR, mild LAE  . Ischemic cardiomyopathy   . CAD (coronary artery disease)     a. LHC (10/07/12):  Ostial LM 60%, distal LM 40-50%, oLAD 40-50%, mLAD 70-80%, pCFX subtotally occluded, mCFX 80-90%, oRCA occluded, ostial L subclavian 40%, EF 25-30%, abdominal aorta diffusely diseased with severe ulcerated plaquing (right iliac ostial 95%; left renal long 60-70%, right okay). CO 2.1, CI 1.5 => s/p CABG (L-LAD, S-OM, S-PDA) 10/2012 with Dr. Cyndia Bent   . Bilateral carotid artery disease 09/26/2012    carotid US (8/14):  R 80-99; L 60-79 => s/p R CEA 10/2012 (Dr. Donnetta Hutching)  . Lung nodule     chest CT 09/2012 => needs repeat by 03/2013  . PAD (peripheral artery disease)   . Hyperlipidemia   . Hyperkalemia     ACEI d/c'd 11/2012  . Bladder cancer 04/26/2013   Past Surgical History  Procedure Laterality Date  . Tonsillectomy  1959  . Hiatal hernia repair  1964  (AGE  70)    CHOLECYSTECTOMY AND APPENDECTOMY  . Left elbow surgery    . Lumbar disc surgery  1993  . Abdominal hysterectomy  1981 (APPROX)    BILATERAL SALPINGO-OOPHORECTOMY WITH EXCEPTION A SMALL PIECE OF OVARY REMAINS  . Transurethral resection of bladder tumor N/A 06/24/2012    Procedure: TRANSURETHRAL RESECTION OF BLADDER TUMOR  WITH GYRUS AND INSTILLATION OF MITOMYCIN C  (TURBT);  Surgeon: Claybon Jabs, MD;  Location: [facility];  Service: Urology;  Laterality: N/A;  . Appendectomy    . Wrist surgery Left   . Cholecystectomy    . Tonsillectomy    . Cardiac catheterization    . Hammer toe surgery Left   . Coronary artery bypass graft N/A 11/14/2012    Procedure: CORONARY ARTERY BYPASS GRAFTING (CABG) times three using left internal mammary artery and left saphenous vein;  Surgeon: Gaye Pollack, MD;  Location: [facility];  Service: Open Heart Surgery;  Laterality: N/A;  . Intraoperative transesophageal echocardiogram N/A 11/14/2012    Procedure: INTRAOPERATIVE TRANSESOPHAGEAL ECHOCARDIOGRAM;  Surgeon: Gaye Pollack, MD;  Location: [facility] OR;  Service: Open Heart Surgery;  Laterality: N/A;  . Endarterectomy Right 11/14/2012    Procedure: RIGHT ENDARTERECTOMY CAROTID with patch angioplasty.;  Surgeon: Arvilla Meres  Early, MD;  Location: [facility] OR;  Service: Vascular;  Laterality: Right;    No Known Allergies  Current Outpatient Prescriptions  Medication Sig Dispense Refill  . ACCU-CHEK FASTCLIX LANCETS MISC Use as directed four times daily to check blood sugar.  Diagnosis code 250.02  306 each  3  . aspirin EC 325 MG EC tablet Take 1 tablet (325 mg total) by mouth daily.  30 tablet  0  . carvedilol (COREG) 12.5 MG tablet Take 1 tablet (12.5 mg total) by mouth 2 (two) times daily with a meal.  180 tablet  3  . furosemide (LASIX) 20 MG tablet Take 1 tablet (20 mg total) by mouth daily.  90 tablet  3  . glucose blood (ACCU-CHEK SMARTVIEW) test strip Use as directed four times daily to check blood  sugar.  Diagnosis code 250.02  300 each  3  . hydrALAZINE (APRESOLINE) 25 MG tablet Take 1 tablet (25 mg total) by mouth 3 (three) times daily.  270 tablet  1  . isosorbide mononitrate (IMDUR) 30 MG 24 hr tablet Take 1 tablet (30 mg total) by mouth daily.  90 tablet  1  . metFORMIN (GLUCOPHAGE) 500 MG tablet Take 1 tablet (500 mg total) by mouth 2 (two) times daily with a meal.  180 tablet  3  . Multiple Vitamin (MULTIVITAMIN) tablet Take 1 tablet by mouth daily.      . Omega-3 Fatty Acids (FISH OIL) 1000 MG CAPS Take 1 each by mouth daily.      . pravastatin (PRAVACHOL) 40 MG tablet Take 1 tablet (40 mg total) by mouth every evening.  90 tablet  1   No current facility-administered medications for this visit.    Pt's meds include: Statin:  yes Beta Blocker:  yes Aspirin:  yes Other antiplatelets/anticoagulants:  no   Family History  Problem Relation Age of Onset  . Cancer Other     colon cancer  . Hypertension Other   . Kidney disease Other   . Diabetes Other   . Heart disease Mother     MI at age 37  . Cervical cancer Mother   . Cancer Mother   . Diabetes Mother   . Hypertension Mother   . Lung cancer Father 72    primary site lung CA then colon and bone  . Colon cancer Father   . Cancer Father   . Hypertension Father     History   Social History  . Marital Status: Widowed    Spouse Name: N/A    Number of Children: 2  . Years of Education: 16   Occupational History  . RN, BSN    Social History Main Topics  . Smoking status: Former Smoker -- 0.20 packs/day for 50 years    Types: Cigarettes    Quit date: 11/13/2012  . Smokeless tobacco: Never Used     Comment: PT HAS BEEN QUITTING--  SMOKES 4 CIG'S PER DAY,  DOWN FROM 1PPD  . Alcohol Use: No     Comment: occasional wine not often  . Drug Use: No  . Sexual Activity: Not on file   Other Topics Concern  . Not on file   Social History Narrative  . No narrative on file     ROS: [x]  Positive   [ ]   Negative   [ ]  All sytems reviewed and are negative  Cardiovascular: []  chest pain/pressure []  palpitations []  SOB lying flat []  DOE []  pain in legs while walking []   pain in feet when lying flat []  hx of DVT []  hx of phlebitis []  swelling in legs []  varicose veins  Pulmonary: []  productive cough []  asthma []  wheezing  Neurologic: []  weakness in []  arms []  legs []  numbness in []  arms []  legs [] difficulty speaking or slurred speech []  temporary loss of vision in one eye []  dizziness  Hematologic: []  bleeding problems []  problems with blood clotting easily  GI []  vomiting blood []  blood in stool  GU: []  burning with urination []  blood in urine  Psychiatric: []  hx of major depression  Integumentary: []  rashes []  ulcers  Constitutional: []  fever []  chills   PHYSICAL EXAMINATION:  Filed Vitals:   06/13/13 1554  BP: 163/119  Pulse:    Body mass index is 20.3 kg/(m^2).  General:  WDWN in NAD Gait: Normal HENT: WNL; normocephalic Eyes: PERRL Pulmonary: normal non-labored breathing , without Rales, rhonchi,  wheezing Cardiac: RRR, without  Murmurs, rubs or gallops; with harsh left carotid bruit; no carotid bruit on the right Abdomen: soft, NT, no masses Skin: without rashes,  ulcers  Extremities: without ischemic changes, without Gangrene , without cellulitis; without open wounds;  Musculoskeletal: without muscle wasting or atrophy  Neurologic: A&O X 3; Appropriate Affect ; SENSATION: normal; MOTOR FUNCTION:  moving all extremities equally. Speech is fluent/normal   Non-Invasive Vascular Imaging: Carotid Duplex Scan:  06/13/2013  1.  Patent right CEA site with no evidence for restenosis 2. Increased velocity in the right proximal common cartid artery 3. 60-79% left internal carotid artery stenosis upper end of range 4. Left external carotid artery stenosis with distal common carotid artery stenosis  ASSESSMENT: 73 y.o. female here for f/u carotid  duplex scan.   PLAN: -she is the upper end of 60-79% stenosis of the left carotid artery.  She will f/u in 6 months and if it has progressed, she will most likely need a left CEA.  If she has any symptoms before then, she knows to call us or go to the emergency department.   Leontine Locket, PA-C [facility] [phone]  Clinic MD:   Pt seen and examined in conjunction with Dr. Donnetta Hutching  For VQI Use Only    PRE-ADM LIVING: [x ] Home, [ ]  Nursing home, [ ]  Homeless  AMB STATUS: [x ] Walking, [ ]  Walking w/ Assistance, [ ]  Wheelchair, [ ] Bed ridden  RECENT HEART ATTACK (<6 mon): No  CAD Sx: [x ] No, [ ]  Asx, h/o MI, [ ]  Stable angina, [ ]  Unstable angina  PRIOR CHF: [ ]  No, [ ]  Asx, [ ]  Mild, [x ] Moderate, [ ]  Severe  STRESS TEST: [x ] No, [ ]  Normal, [ ]  + ischemia, [ ]  + MI, [ ]  Both

## 2013-07-08 ENCOUNTER — Other Ambulatory Visit: Payer: Self-pay | Admitting: Internal Medicine

## 2013-09-21 LAB — HM DIABETES EYE EXAM

## 2013-09-27 ENCOUNTER — Encounter: Payer: Self-pay | Admitting: Internal Medicine

## 2013-10-03 ENCOUNTER — Other Ambulatory Visit: Payer: Self-pay | Admitting: Cardiology

## 2013-10-03 ENCOUNTER — Other Ambulatory Visit: Payer: Self-pay

## 2013-10-03 MED ORDER — ACCU-CHEK FASTCLIX LANCETS MISC: Status: DC

## 2013-10-03 MED ORDER — GLUCOSE BLOOD VI STRP: ORAL_STRIP | Status: DC

## 2013-10-05 ENCOUNTER — Other Ambulatory Visit: Payer: Self-pay

## 2013-10-05 MED ORDER — GLUCOSE BLOOD VI STRP: ORAL_STRIP | Status: DC

## 2013-10-17 ENCOUNTER — Other Ambulatory Visit: Payer: Self-pay

## 2013-10-17 MED ORDER — ACCU-CHEK NANO SMARTVIEW W/DEVICE KIT: PACK | Status: DC

## 2013-11-01 ENCOUNTER — Other Ambulatory Visit (INDEPENDENT_AMBULATORY_CARE_PROVIDER_SITE_OTHER): Payer: Commercial Managed Care - HMO

## 2013-11-01 ENCOUNTER — Encounter: Payer: Self-pay | Admitting: Internal Medicine

## 2013-11-01 ENCOUNTER — Ambulatory Visit (INDEPENDENT_AMBULATORY_CARE_PROVIDER_SITE_OTHER): Payer: Medicare HMO | Admitting: Internal Medicine

## 2013-11-01 VITALS — BP 132/70 | HR 70 | Temp 98.5°F | Wt 98.5 lb

## 2013-11-01 DIAGNOSIS — R634 Abnormal weight loss: Secondary | ICD-10-CM

## 2013-11-01 DIAGNOSIS — IMO0001 Reserved for inherently not codable concepts without codable children: Secondary | ICD-10-CM

## 2013-11-01 DIAGNOSIS — E1165 Type 2 diabetes mellitus with hyperglycemia: Principal | ICD-10-CM

## 2013-11-01 DIAGNOSIS — J438 Other emphysema: Secondary | ICD-10-CM

## 2013-11-01 LAB — LIPID PANEL
CHOL/HDL RATIO: 3
Cholesterol: 114 mg/dL (ref 0–200)
HDL: 45.3 mg/dL (ref >39.00)
LDL Cholesterol: 49 mg/dL (ref 0–99)
NONHDL: 68.7
TRIGLYCERIDES: 99 mg/dL (ref 0.0–149.0)
VLDL: 19.8 mg/dL (ref 0.0–40.0)

## 2013-11-01 LAB — BASIC METABOLIC PANEL
BUN: 10 mg/dL (ref 6–23)
CALCIUM: 9.4 mg/dL (ref 8.4–10.5)
CO2: 28 mEq/L (ref 19–32)
Chloride: 99 mEq/L (ref 96–112)
Creatinine, Ser: 0.8 mg/dL (ref 0.4–1.2)
GFR: 79.04 mL/min (ref >60.00)
Glucose, Bld: 98 mg/dL (ref 70–99)
Potassium: 3.5 mEq/L (ref 3.5–5.1)
Sodium: 136 mEq/L (ref 135–145)

## 2013-11-01 LAB — HEPATIC FUNCTION PANEL
ALT: 16 U/L (ref 0–35)
AST: 28 U/L (ref 0–37)
Albumin: 3.8 g/dL (ref 3.5–5.2)
Alkaline Phosphatase: 63 U/L (ref 39–117)
BILIRUBIN DIRECT: 0.1 mg/dL (ref 0.0–0.3)
TOTAL PROTEIN: 7.2 g/dL (ref 6.0–8.3)
Total Bilirubin: 0.5 mg/dL (ref 0.2–1.2)

## 2013-11-01 LAB — HEMOGLOBIN A1C: Hgb A1c MFr Bld: 6.8 % — ABNORMAL HIGH (ref 4.6–6.5)

## 2013-11-01 NOTE — Assessment & Plan Note (Signed)
stable overall by history and exam, recent data reviewed with pt, and pt to continue medical treatment as before,  to f/u any worsening symptoms or concerns Lab Results  Component Value Date   HGBA1C 7.7* 11/09/2012  \for fu a1c

## 2013-11-01 NOTE — Assessment & Plan Note (Signed)
Likely diet related per pt, quit smoking, recent ct chest neg for malignancy, has f/u cyst every 3 mo for bladder ca with Dr Karsten Ro, exam o/w benign today, so also for spep today,  to f/u any worsening symptoms or concerns

## 2013-11-01 NOTE — Assessment & Plan Note (Signed)
stable overall by history and exam, recent data reviewed with pt, and pt to continue medical treatment as before,  to f/u any worsening symptoms or concerns SpO2 Readings from Last 3 Encounters:  11/01/13 97%  06/13/13 100%  04/26/13 95%

## 2013-11-01 NOTE — Progress Notes (Signed)
Subjective:    Patient ID: Tonya Valdez, female    DOB: November 29, 1940, 73 y.o.   MRN: [id number]  HPI  Here to f/u; overall doing ok,  Pt denies chest pain, increased sob or doe, wheezing, orthopnea, PND, increased LE swelling, palpitations, dizziness or syncope.  Pt denies polydipsia, polyuria, or low sugar symptoms such as weakness or confusion improved with po intake.  Pt denies new neurological symptoms such as new headache, or facial or extremity weakness or numbness.   Pt states overall good compliance with meds, has been trying to follow lower cholesterol, diabetic diet, with wt overall stable,  but little exercise however. Has good compliance with metformin, cbg's controlled excellent at home.  Did quit smoking recently.  Has lost 10 lbs since last visit, but states due to diet, does not feel bad at all, no complaints. Ready and willing and wanting to go back to work. Needs letter to say neg for active TB after remote hx of pos PPD in her 30's; did have ct chest neg mar 2015.  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 April 2015 carotids stable exam, has appt with vascular soon.  Declines flu shot. Due for lab Past Medical History  Diagnosis Date  . Positive TB test   . Bladder tumor   . COPD (chronic obstructive pulmonary disease)   . Diabetes mellitus TYPE II--  PER PCP NOTE DR Cathlean Cower    PER PT DOES NOT TAKES METFORMIN PRESCRIBED, SHE WATCHES DIET AND EXERCISES  . Cancer     bladder  . H/O hiatal hernia     s/p  73 yrs old  . Chronic systolic CHF (congestive heart failure) 08/04/2012    Echo (08/17/12): EF 30%, diffuse HK, mild MR, mild BAE, PASP.;  Echo (01/2013): EF 40-45%, inferior posterior HK, Gr 2 DD, mild MR, mild LAE  . Ischemic cardiomyopathy   . CAD (coronary artery disease)     a. LHC (10/07/12):  Ostial LM 60%, distal LM 40-50%, oLAD 40-50%, mLAD 70-80%, pCFX subtotally occluded, mCFX 80-90%, oRCA occluded, ostial L subclavian 40%, EF 25-30%,  abdominal aorta diffusely diseased with severe ulcerated plaquing (right iliac ostial 95%; left renal long 60-70%, right okay). CO 2.1, CI 1.5 => s/p CABG (L-LAD, S-OM, S-PDA) 10/2012 with Dr. Cyndia Bent   . Bilateral carotid artery disease 09/26/2012    carotid US (8/14):  R 80-99; L 60-79 => s/p R CEA 10/2012 (Dr. Donnetta Hutching)  . Lung nodule     chest CT 09/2012 => needs repeat by 03/2013  . PAD (peripheral artery disease)   . Hyperlipidemia   . Hyperkalemia     ACEI d/c'd 11/2012  . Bladder cancer 04/26/2013   Past Surgical History  Procedure Laterality Date  . Tonsillectomy  1959  . Hiatal hernia repair  1964  (AGE 72)    CHOLECYSTECTOMY AND APPENDECTOMY  . Left elbow surgery    . Lumbar disc surgery  1993  . Abdominal hysterectomy  1981 (APPROX)    BILATERAL SALPINGO-OOPHORECTOMY WITH EXCEPTION A SMALL PIECE OF OVARY REMAINS  . Transurethral resection of bladder tumor N/A 06/24/2012    Procedure: TRANSURETHRAL RESECTION OF BLADDER TUMOR  WITH GYRUS AND INSTILLATION OF MITOMYCIN C  (TURBT);  Surgeon: Claybon Jabs, MD;  Location: [facility];  Service: Urology;  Laterality: N/A;  . Appendectomy    . Wrist surgery Left   . Cholecystectomy    .  Tonsillectomy    . Cardiac catheterization    . Hammer toe surgery Left   . Coronary artery bypass graft N/A 11/14/2012    Procedure: CORONARY ARTERY BYPASS GRAFTING (CABG) times three using left internal mammary artery and left saphenous vein;  Surgeon: Gaye Pollack, MD;  Location: [place] OR;  Service: Open Heart Surgery;  Laterality: N/A;  . Intraoperative transesophageal echocardiogram N/A 11/14/2012    Procedure: INTRAOPERATIVE TRANSESOPHAGEAL ECHOCARDIOGRAM;  Surgeon: Gaye Pollack, MD;  Location: [facility];  Service: Open Heart Surgery;  Laterality: N/A;  . Endarterectomy Right 11/14/2012    Procedure: RIGHT ENDARTERECTOMY CAROTID with patch angioplasty.;  Surgeon: Rosetta Posner, MD;  Location: [place];  Service: Vascular;  Laterality: Right;     reports that she quit smoking about a year ago. Her smoking use included Cigarettes. She has a 10 pack-year smoking history. She has never used smokeless tobacco. She reports that she does not drink alcohol or use illicit drugs. family history includes Cancer in her father, mother, and other; Cervical cancer in her mother; Colon cancer in her father; Diabetes in her mother and other; Heart disease in her mother; Hypertension in her father, mother, and other; Kidney disease in her other; Lung cancer (age of onset: 53) in her father. No Known Allergies Current Outpatient Prescriptions on File Prior to Visit  Medication Sig Dispense Refill  . ACCU-CHEK FASTCLIX LANCETS MISC Use as directed four times daily to check blood sugar.  Diagnosis code 250.02  306 each  3  . aspirin EC 325 MG EC tablet Take 1 tablet (325 mg total) by mouth daily.  30 tablet  0  . Blood Glucose Monitoring Suppl (ACCU-CHEK NANO SMARTVIEW) W/DEVICE KIT Diagnosis code 250.02 to use to check blood sugar.  1 kit  0  . carvedilol (COREG) 12.5 MG tablet Take 1 tablet (12.5 mg total) by mouth 2 (two) times daily with a meal.  180 tablet  3  . furosemide (LASIX) 20 MG tablet Take 1 tablet (20 mg total) by mouth daily.  90 tablet  3  . furosemide (LASIX) 20 MG tablet TAKE ONE TABLET BY MOUTH ONCE DAILY AS NEEDED  90 tablet  0  . glucose blood (ACCU-CHEK SMARTVIEW) test strip Use as directed four times daily to check blood sugar.  Diagnosis code 250.02  300 each  3  . hydrALAZINE (APRESOLINE) 25 MG tablet TAKE 1 TABLET THREE TIMES DAILY  270 tablet  1  . isosorbide mononitrate (IMDUR) 30 MG 24 hr tablet TAKE 1 TABLET EVERY DAY  90 tablet  1  . metFORMIN (GLUCOPHAGE) 500 MG tablet Take 1 tablet (500 mg total) by mouth 2 (two) times daily with a meal.  180 tablet  3  . Multiple Vitamin (MULTIVITAMIN) tablet Take 1 tablet by mouth daily.      . Omega-3 Fatty Acids (FISH OIL) 1000 MG CAPS Take 1 each by mouth daily.      . pravastatin  (PRAVACHOL) 40 MG tablet TAKE 1 TABLET EVERY EVENING  90 tablet  1   No current facility-administered medications on file prior to visit.    Review of Systems  Constitutional: Negative for unusual diaphoresis or other sweats  HENT: Negative for ringing in ear Eyes: Negative for double vision or worsening visual disturbance.  Respiratory: Negative for choking and stridor.   Gastrointestinal: Negative for vomiting or other signifcant bowel change Genitourinary: Negative for hematuria or decreased urine volume.  Musculoskeletal: Negative for other MSK pain or swelling  Skin: Negative for color change and worsening wound.  Neurological: Negative for tremors and numbness other than noted  Psychiatric/Behavioral: Negative for decreased concentration or agitation other than above       Objective:   Physical Exam BP 132/70  Pulse 70  Temp(Src) 98.5 F (36.9 C) (Oral)  Wt 98 lb 8 oz (44.679 kg)  SpO2 97% VS noted,  Constitutional: Pt appears thin HENT: Head: NCAT.  Right Ear: External ear normal.  Left Ear: External ear normal.  Eyes: . Pupils are equal, round, and reactive to light. Conjunctivae and EOM are normal Neck: Normal range of motion. Neck supple.  Cardiovascular: Normal rate and regular rhythm.   Pulmonary/Chest: Effort normal and breath sounds normal.  Abd:  Soft, NT, ND, + BS Neurological: Pt is alert. Not confused , motor grossly intact Skin: Skin is warm. No rash Psychiatric: Pt behavior is normal. No agitation. not depressed affect    Assessment & Plan:   Wt Readings from Last 3 Encounters:  11/01/13 98 lb 8 oz (44.679 kg)  06/13/13 111 lb (50.349 kg)  05/01/13 108 lb (48.988 kg)

## 2013-11-01 NOTE — Progress Notes (Signed)
Pre visit review using our clinic review tool, if applicable. No additional management support is needed unless otherwise documented below in the visit note. 

## 2013-11-01 NOTE — Patient Instructions (Addendum)
You are given the work note today wth regards to TB  Please continue all other medications as before, and refills have been done if requested.  Please have the pharmacy call with any other refills you may need.  Please continue your efforts at being active, low cholesterol diet, and weight control.  Please keep your appointments with your specialists as you may have planned  Please go to the LAB in the Basement (turn left off the elevator) for the tests to be done today\  You will be contacted by phone if any changes need to be made immediately.  Otherwise, you will receive a letter about your results with an explanation, but please check with MyChart first.  Please remember to sign up for MyChart if you have not done so, as this will be important to you in the future with finding out test results, communicating by private email, and scheduling acute appointments online when needed.  Please return in 6 months, or sooner if needed

## 2013-11-02 ENCOUNTER — Telehealth: Payer: Self-pay

## 2013-11-02 NOTE — Telephone Encounter (Signed)
Re: called to schedule Nurse Visit to recheck Blood Pressure

## 2013-11-03 LAB — PROTEIN ELECTROPHORESIS, SERUM
ALPHA-1-GLOBULIN: 7.5 % — AB (ref 2.9–4.9)
Albumin ELP: 55.7 % — ABNORMAL LOW (ref 55.8–66.1)
Alpha-2-Globulin: 13.6 % — ABNORMAL HIGH (ref 7.1–11.8)
BETA 2: 4.6 % (ref 3.2–6.5)
Beta Globulin: 6.5 % (ref 4.7–7.2)
Gamma Globulin: 12.1 % (ref 11.1–18.8)
TOTAL PROTEIN, SERUM ELECTROPHOR: 7 g/dL (ref 6.0–8.3)

## 2013-11-07 ENCOUNTER — Other Ambulatory Visit: Payer: Self-pay | Admitting: *Deleted

## 2013-11-07 DIAGNOSIS — I5022 Chronic systolic (congestive) heart failure: Secondary | ICD-10-CM

## 2013-11-07 MED ORDER — METFORMIN HCL 500 MG PO TABS: 500.0000 mg | ORAL_TABLET | Freq: Two times a day (BID) | ORAL | Status: DC

## 2013-11-07 MED ORDER — FUROSEMIDE 20 MG PO TABS: 20.0000 mg | ORAL_TABLET | Freq: Every day | ORAL | Status: DC

## 2013-11-07 NOTE — Telephone Encounter (Signed)
Pt stated she is needing new rx on her metformin & furosemide sent to [facility] mail service. Inform pt we will send...Johny Chess

## 2013-11-22 ENCOUNTER — Telehealth: Payer: Self-pay | Admitting: *Deleted

## 2013-11-22 DIAGNOSIS — R911 Solitary pulmonary nodule: Secondary | ICD-10-CM

## 2013-11-22 NOTE — Telephone Encounter (Signed)
Spoke with pt, it is time to schedule CT scan to follow up lung nodule. Patient voiced understanding someone from the office will call to schedule.

## 2013-11-26 IMAGING — CR DG CHEST 2V
2 series · 2 of 2 positions shown · non-contrast
Comparison: CT, 10/06/2012 and chest radiograph, 08/04/2012

CLINICAL DATA: Pre-admission for surgery.

EXAM:
CHEST  2 VIEW

[w chest pa]
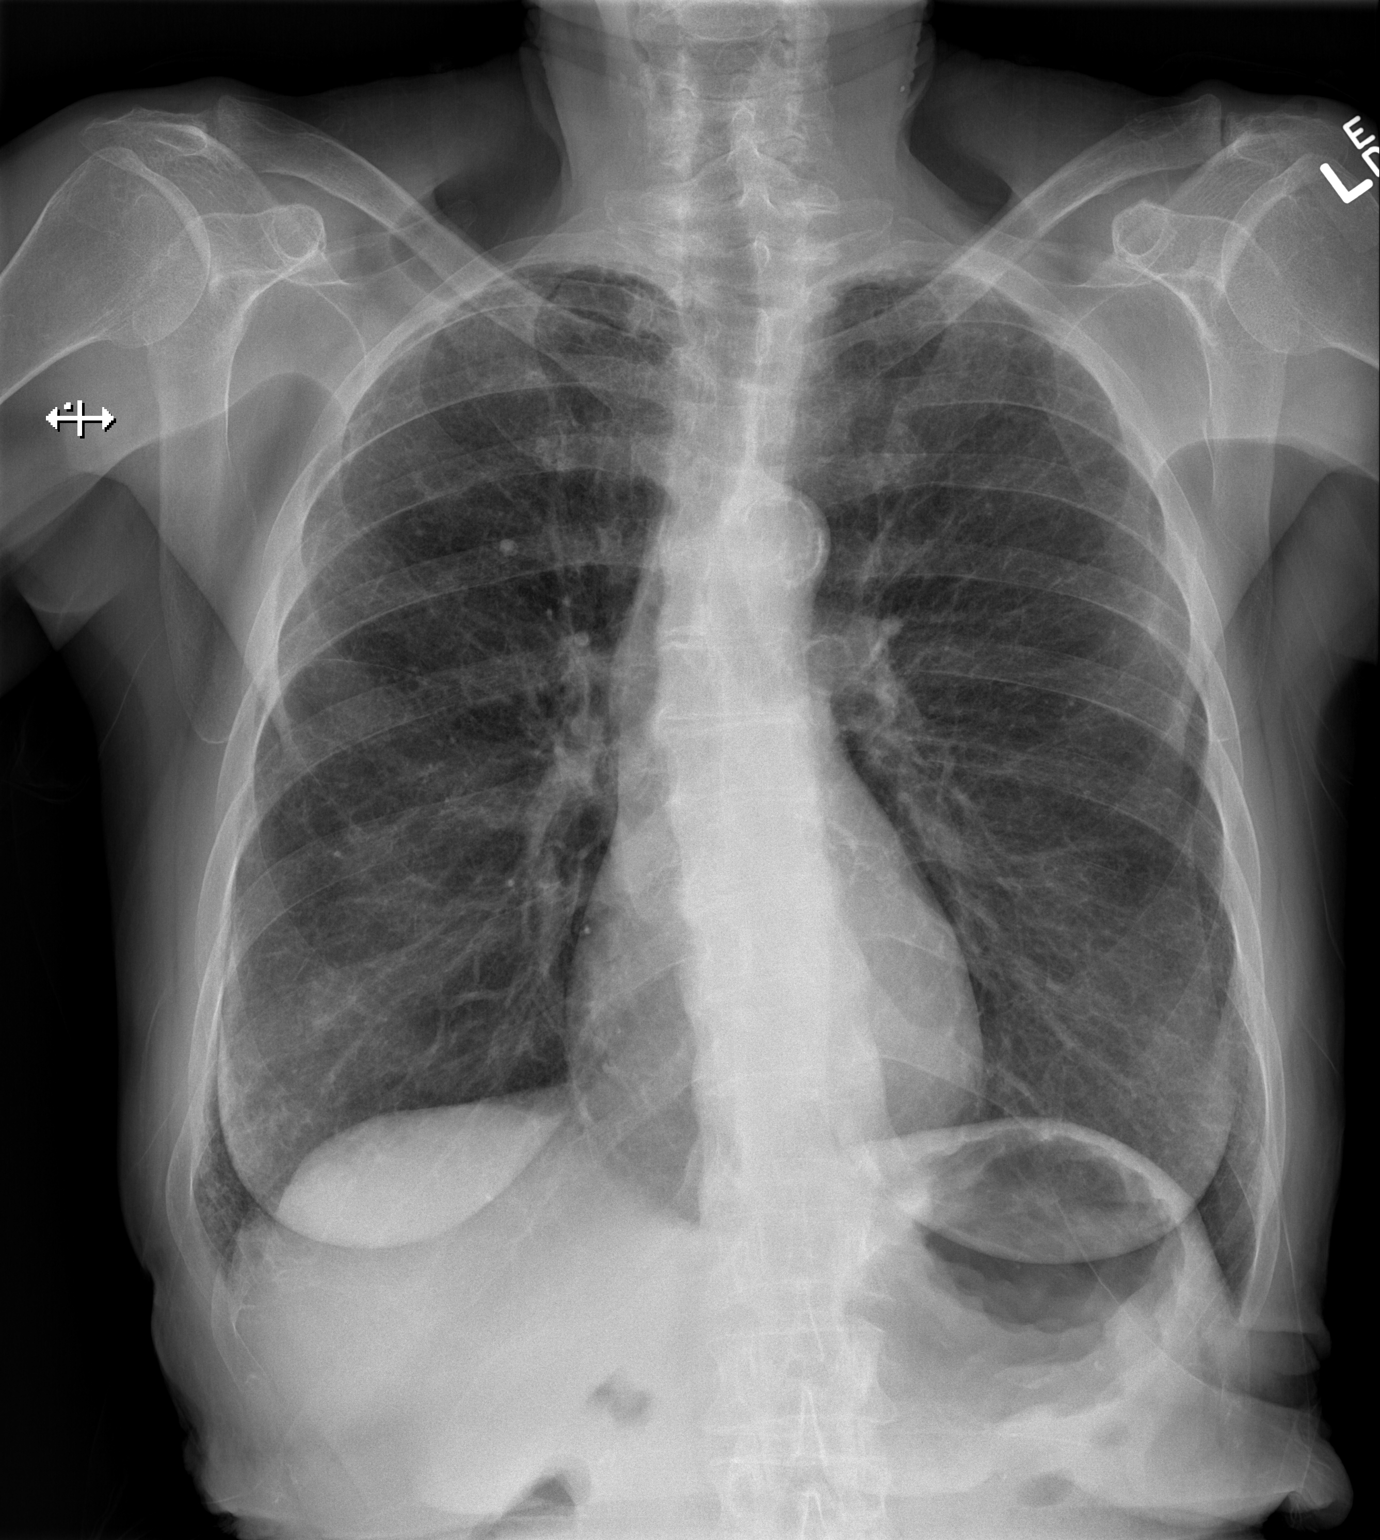

[w chest lat]
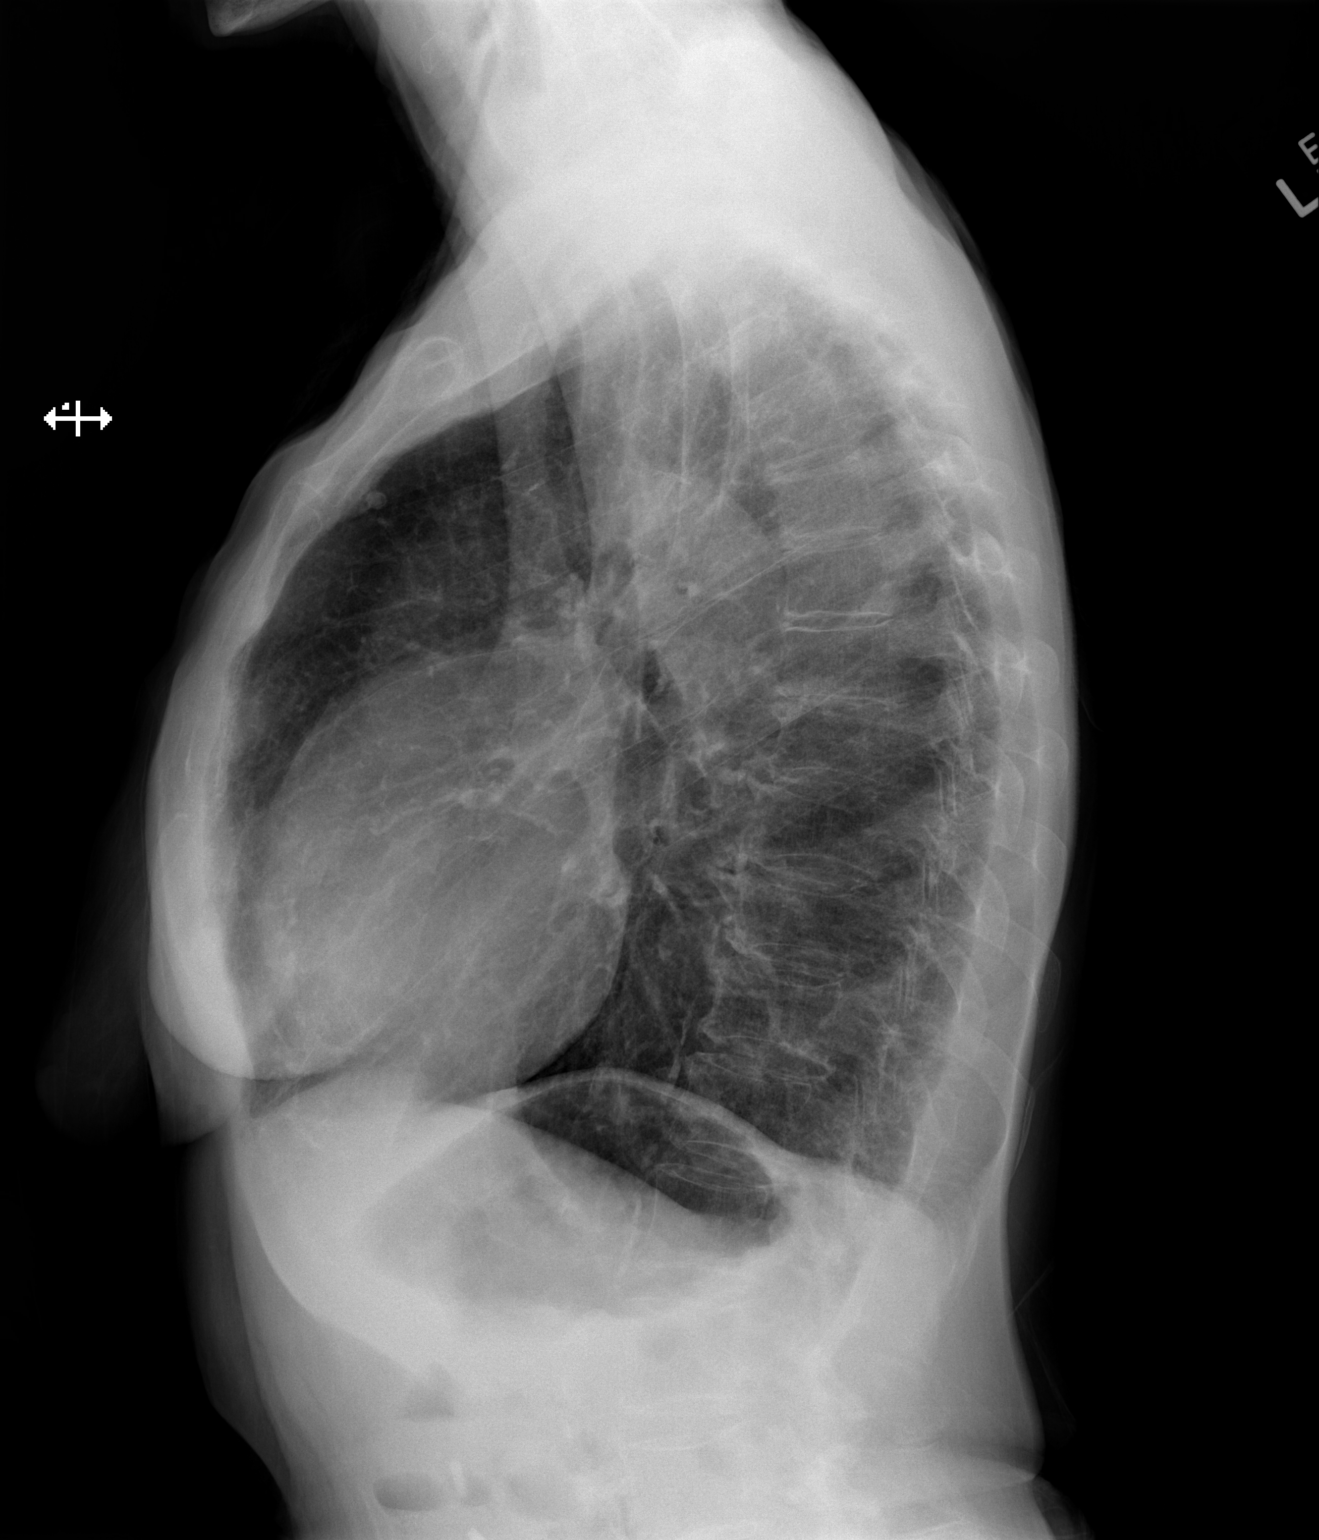

[2 of 2 positions shown; findings below may reference images not displayed]

FINDINGS: The cardiac silhouette is normal in size and configuration. The
mediastinum is normal in contour and caliber. There are no hilar
masses.

There is a small healed granuloma in the right upper lobe, stable.
The lungs are hyperexpanded. The lungs show reticular scarring most
evident in the apices and right lateral lung base. No infiltrate or
pulmonary edema is seen. No pleural effusion or pneumothorax.

The bony thorax is demineralized but intact.
IMPRESSION: No acute cardiopulmonary disease.

COPD and lung scarring.

## 2013-11-27 ENCOUNTER — Ambulatory Visit (INDEPENDENT_AMBULATORY_CARE_PROVIDER_SITE_OTHER): Payer: Commercial Managed Care - HMO | Admitting: Cardiology

## 2013-11-27 ENCOUNTER — Encounter: Payer: Self-pay | Admitting: Cardiology

## 2013-11-27 VITALS — BP 142/50 | HR 77 | Ht 62.0 in | Wt 100.4 lb

## 2013-11-27 DIAGNOSIS — I779 Disorder of arteries and arterioles, unspecified: Secondary | ICD-10-CM

## 2013-11-27 DIAGNOSIS — R911 Solitary pulmonary nodule: Secondary | ICD-10-CM

## 2013-11-27 DIAGNOSIS — I739 Peripheral vascular disease, unspecified: Secondary | ICD-10-CM

## 2013-11-27 DIAGNOSIS — I25810 Atherosclerosis of coronary artery bypass graft(s) without angina pectoris: Secondary | ICD-10-CM

## 2013-11-27 DIAGNOSIS — I2510 Atherosclerotic heart disease of native coronary artery without angina pectoris: Secondary | ICD-10-CM

## 2013-11-27 LAB — BASIC METABOLIC PANEL WITH GFR
BUN: 15 mg/dL (ref 6–23)
CHLORIDE: 103 meq/L (ref 96–112)
CO2: 29 mEq/L (ref 19–32)
CREATININE: 0.77 mg/dL (ref 0.50–1.10)
Calcium: 9.2 mg/dL (ref 8.4–10.5)
GFR, EST NON AFRICAN AMERICAN: 76 mL/min
GFR, Est African American: 88 mL/min
Glucose, Bld: 138 mg/dL — ABNORMAL HIGH (ref 70–99)
Potassium: 4.2 mEq/L (ref 3.5–5.3)
SODIUM: 139 meq/L (ref 135–145)

## 2013-11-27 MED ORDER — CARVEDILOL 12.5 MG PO TABS: 18.7500 mg | ORAL_TABLET | Freq: Two times a day (BID) | ORAL | Status: DC

## 2013-11-27 NOTE — Assessment & Plan Note (Signed)
Continue aspirin and statin. 

## 2013-11-27 NOTE — Patient Instructions (Signed)
Your physician wants you to follow-up in: [place] will receive a reminder letter in the mail two months in advance. If you don't receive a letter, please call our office to schedule the follow-up appointment.   INCREASE CARVEDILOL TO 18.75 MG TWICE DAILY= 1 AND 1/2 OF 12.5 MG TABLET TWICE DAILY  Your physician recommends that you HAVE LAB WORK TODAY

## 2013-11-27 NOTE — Assessment & Plan Note (Signed)
Blood pressure mildly elevated. Increase carvedilol to 18.75 mg by mouth twice a day.

## 2013-11-27 NOTE — Progress Notes (Signed)
HPI: FU CAD. Echo (08/17/12): EF 30%, diffuse HK, mild MR, mild BAE, PASP. Carotid US (3/84): RICA 53-64%; LICA 68-03%. Abdominal US (8/14): No AAA, severe bilateral CIA stenosis noted. She was referred to vascular surgery for possible CEA and evaluation of PAD. LHC (10/07/12): oLM 60%, dLM 40-50%, oLAD 40-50%, mLAD 70-80%, pCFX subtotally occluded, mCFX 80-90%, oRCA occluded, ostial L subclavian 40%, EF 25-30%, abdominal aorta diffusely diseased with severe ulcerated plaquing (right iliac ostial 95%; left renal long 60-70%, right okay). CO 2.1, CI 1.5. Pre-CABG ABIs: normal. She underwent CABG 11/14/12 (LIMA-LAD, SVG-OM, SVG-PDA) along with right CEA. Developed hyperkalemia with ACEI. Echocardiogram repeated December 2014 and revealed an ejection fraction of 40-45%. There was grade 2 diastolic dysfunction. There was mild left atrial enlargement and mild mitral regurgitation. Chest CT (3/15): Posterior RLL 60m nodule; f/u rec in 6 mos.  Since I last saw her, the patient has dyspnea with more extreme activities but not with routine activities. It is relieved with rest. It is not associated with chest pain. There is no orthopnea, PND or pedal edema. There is no syncope or palpitations. There is no exertional chest pain.    Current Outpatient Prescriptions  Medication Sig Dispense Refill  . ACCU-CHEK FASTCLIX LANCETS MISC Use as directed four times daily to check blood sugar.  Diagnosis code 250.02  306 each  3  . aspirin EC 325 MG EC tablet Take 1 tablet (325 mg total) by mouth daily.  30 tablet  0  . Blood Glucose Monitoring Suppl (ACCU-CHEK NANO SMARTVIEW) W/DEVICE KIT Diagnosis code 250.02 to use to check blood sugar.  1 kit  0  . carvedilol (COREG) 12.5 MG tablet Take 1 tablet (12.5 mg total) by mouth 2 (two) times daily with a meal.  180 tablet  3  . furosemide (LASIX) 20 MG tablet TAKE ONE TABLET BY MOUTH ONCE DAILY AS NEEDED  90 tablet  0  . glucose blood (ACCU-CHEK SMARTVIEW) test strip Use  as directed four times daily to check blood sugar.  Diagnosis code 250.02  300 each  3  . hydrALAZINE (APRESOLINE) 25 MG tablet TAKE 1 TABLET THREE TIMES DAILY  270 tablet  1  . isosorbide mononitrate (IMDUR) 30 MG 24 hr tablet TAKE 1 TABLET EVERY DAY  90 tablet  1  . metFORMIN (GLUCOPHAGE) 500 MG tablet Take 1 tablet (500 mg total) by mouth 2 (two) times daily with a meal.  180 tablet  3  . Multiple Vitamin (MULTIVITAMIN) tablet Take 1 tablet by mouth daily.      . Omega-3 Fatty Acids (FISH OIL) 1000 MG CAPS Take 1 each by mouth daily.      . pravastatin (PRAVACHOL) 40 MG tablet TAKE 1 TABLET EVERY EVENING  90 tablet  1   No current facility-administered medications for this visit.     Past Medical History  Diagnosis Date  . Positive TB test   . Bladder tumor   . COPD (chronic obstructive pulmonary disease)   . Diabetes mellitus TYPE II--  PER PCP NOTE DR JCathlean Cower   PER PT DOES NOT TAKES METFORMIN PRESCRIBED, SHE WATCHES DIET AND EXERCISES  . Cancer     bladder  . H/O hiatal hernia     s/p  73yrs old  . Chronic systolic CHF (congestive heart failure) 08/04/2012    Echo (08/17/12): EF 30%, diffuse HK, mild MR, mild BAE, PASP.;  Echo (01/2013): EF 40-45%, inferior posterior HK, Gr 2 DD,  mild MR, mild LAE  . Ischemic cardiomyopathy   . CAD (coronary artery disease)     a. LHC (10/07/12):  Ostial LM 60%, distal LM 40-50%, oLAD 40-50%, mLAD 70-80%, pCFX subtotally occluded, mCFX 80-90%, oRCA occluded, ostial L subclavian 40%, EF 25-30%, abdominal aorta diffusely diseased with severe ulcerated plaquing (right iliac ostial 95%; left renal long 60-70%, right okay). CO 2.1, CI 1.5 => s/p CABG (L-LAD, S-OM, S-PDA) 10/2012 with Dr. Cyndia Bent   . Bilateral carotid artery disease 09/26/2012    carotid US (8/14):  R 80-99; L 60-79 => s/p R CEA 10/2012 (Dr. Donnetta Hutching)  . Lung nodule     chest CT 09/2012 => needs repeat by 03/2013  . PAD (peripheral artery disease)   . Hyperlipidemia   . Hyperkalemia      ACEI d/c'd 11/2012  . Bladder cancer 04/26/2013    Past Surgical History  Procedure Laterality Date  . Tonsillectomy  1959  . Hiatal hernia repair  1964  (AGE 45)    CHOLECYSTECTOMY AND APPENDECTOMY  . Left elbow surgery    . Lumbar disc surgery  1993  . Abdominal hysterectomy  1981 (APPROX)    BILATERAL SALPINGO-OOPHORECTOMY WITH EXCEPTION A SMALL PIECE OF OVARY REMAINS  . Transurethral resection of bladder tumor N/A 06/24/2012    Procedure: TRANSURETHRAL RESECTION OF BLADDER TUMOR  WITH GYRUS AND INSTILLATION OF MITOMYCIN C  (TURBT);  Surgeon: Claybon Jabs, MD;  Location: [facility];  Service: Urology;  Laterality: N/A;  . Appendectomy    . Wrist surgery Left   . Cholecystectomy    . Tonsillectomy    . Cardiac catheterization    . Hammer toe surgery Left   . Coronary artery bypass graft N/A 11/14/2012    Procedure: CORONARY ARTERY BYPASS GRAFTING (CABG) times three using left internal mammary artery and left saphenous vein;  Surgeon: Gaye Pollack, MD;  Location: [place] OR;  Service: Open Heart Surgery;  Laterality: N/A;  . Intraoperative transesophageal echocardiogram N/A 11/14/2012    Procedure: INTRAOPERATIVE TRANSESOPHAGEAL ECHOCARDIOGRAM;  Surgeon: Gaye Pollack, MD;  Location: [facility];  Service: Open Heart Surgery;  Laterality: N/A;  . Endarterectomy Right 11/14/2012    Procedure: RIGHT ENDARTERECTOMY CAROTID with patch angioplasty.;  Surgeon: Rosetta Posner, MD;  Location: [place];  Service: Vascular;  Laterality: Right;    History   Social History  . Marital Status: Widowed    Spouse Name: N/A    Number of Children: 2  . Years of Education: 16   Occupational History  . RN, BSN    Social History Main Topics  . Smoking status: Former Smoker -- 0.20 packs/day for 50 years    Types: Cigarettes    Quit date: 11/13/2012  . Smokeless tobacco: Never Used     Comment: PT HAS BEEN QUITTING--  SMOKES 4 CIG'S PER DAY,  DOWN FROM 1PPD  . Alcohol Use: No     Comment:  occasional wine not often  . Drug Use: No  . Sexual Activity: Not on file   Other Topics Concern  . Not on file   Social History Narrative  . No narrative on file    ROS: no fevers or chills, productive cough, hemoptysis, dysphasia, odynophagia, melena, hematochezia, dysuria, hematuria, rash, seizure activity, orthopnea, PND, pedal edema, claudication. Remaining systems are negative.  Physical Exam: Well-developed well-nourished in no acute distress.  Skin is warm and dry.  HEENT is normal.  Neck is supple.  Chest is clear  to auscultation with normal expansion.  Cardiovascular exam is regular rate and rhythm.  Abdominal exam nontender or distended. No masses palpated. Positive bruit Extremities show no edema. neuro grossly intact  ECG Sinus rhythm at a rate of 77. Frequent PVCs. Nonspecific ST changes.

## 2013-11-27 NOTE — Assessment & Plan Note (Signed)
Schedule followup chest CT.

## 2013-11-27 NOTE — Assessment & Plan Note (Signed)
Continue present dose of Lasix. Check potassium and renal function. 

## 2013-11-27 NOTE — Assessment & Plan Note (Signed)
Followed by vascular surgery.Continue aspirin and statin.

## 2013-11-27 NOTE — Assessment & Plan Note (Signed)
Patient had hyperkalemia with ACE inhibitor previously. Continue hydralazine/nitrate. Increase carvedilol to 18.75 mg by mouth twice a day.

## 2013-11-28 ENCOUNTER — Ambulatory Visit (INDEPENDENT_AMBULATORY_CARE_PROVIDER_SITE_OTHER)
Admission: RE | Admit: 2013-11-28 | Discharge: 2013-11-28 | Disposition: A | Discharge status: Home / Self Care | Payer: Commercial Managed Care - HMO | Source: Ambulatory Visit | Attending: Cardiology | Admitting: Cardiology

## 2013-11-28 DIAGNOSIS — R911 Solitary pulmonary nodule: Secondary | ICD-10-CM

## 2013-11-29 ENCOUNTER — Other Ambulatory Visit: Payer: Self-pay | Admitting: *Deleted

## 2013-11-29 DIAGNOSIS — R9389 Abnormal findings on diagnostic imaging of other specified body structures: Secondary | ICD-10-CM

## 2013-11-30 ENCOUNTER — Telehealth: Payer: Self-pay | Admitting: Cardiology

## 2013-11-30 NOTE — Telephone Encounter (Signed)
Left message for Tonya Valdez to call back

## 2013-11-30 NOTE — Telephone Encounter (Signed)
Jana Half was returning Debra's call about a referral she sent over in regards to the pt. Please call  Thanks

## 2013-12-01 ENCOUNTER — Telehealth: Payer: Self-pay | Admitting: Cardiology

## 2013-12-01 DIAGNOSIS — R911 Solitary pulmonary nodule: Secondary | ICD-10-CM

## 2013-12-01 IMAGING — CR DG CHEST 1V PORT
1 series · 1 of 1 positions shown · non-contrast
Comparison: 11/09/2012 and earlier.

CLINICAL DATA: 72-year-old female status post cardiac surgery.

EXAM:
PORTABLE CHEST - 1 VIEW

[AP]
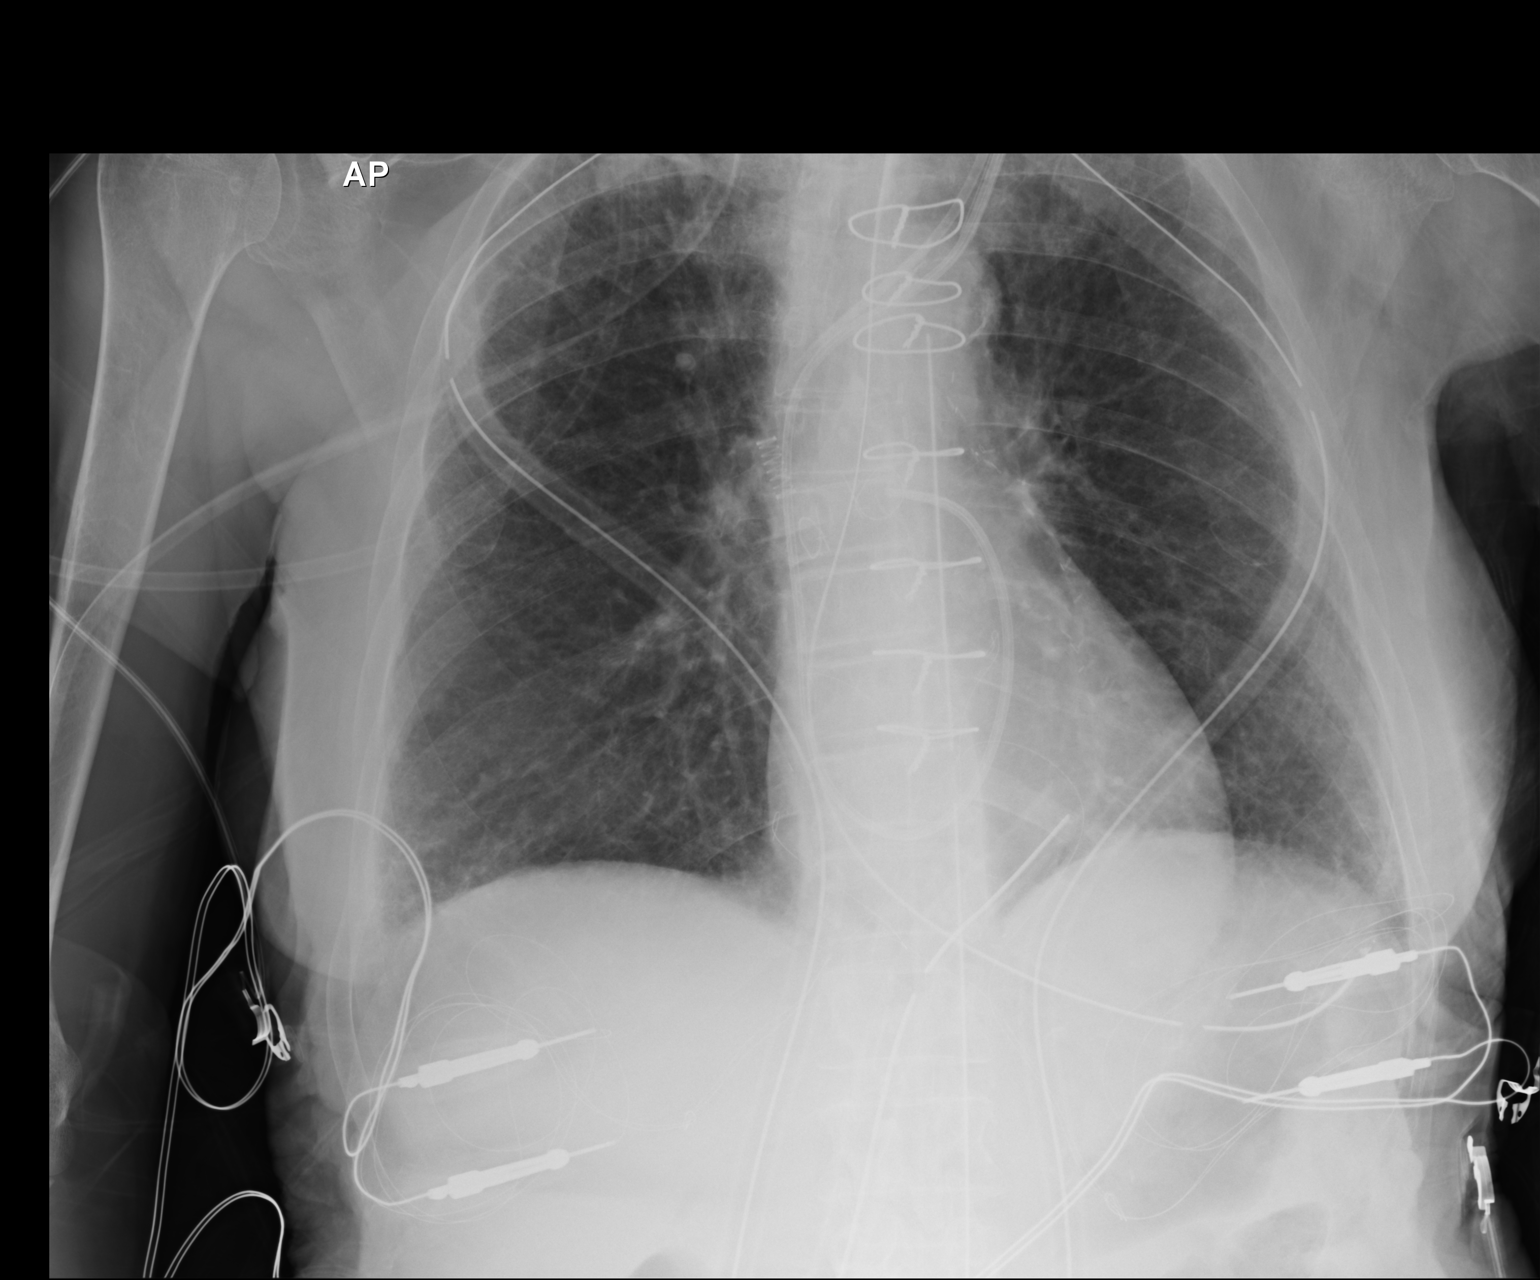

[1 of 1 positions shown; findings below may reference images not displayed]

FINDINGS: Portable AP view at 2575 hrs.

Bilateral chest tubes are in place. Mediastinal drain in place. No
pneumothorax identified.

Endotracheal tube tip 10 mm above the carinal. Enteric tube courses
to the left upper quadrant, side hole the level of the gastric
fundus. Left IJ approach central venous catheter and Swan-Ganz
catheter. Bedu tip at the level of the proximal right main
pulmonary artery.

Normal cardiac size and mediastinal contours. Sequelae of CABG. No
pulmonary edema. Increased apical and peripheral pulmonary opacity,
favor atelectasis. No definite pleural effusion. Epicardial pacer
wires in place.
IMPRESSION: 1. Lines and tubes appear per bili placed as above.

2. No pneumothorax.  Mild atelectasis.

## 2013-12-01 NOTE — Telephone Encounter (Signed)
Pt has appt with Dr. Cyndia Bent for abnormal chest CT/lung nodule on October 21.  Jana Half from Dr. Cyndia Bent called and would like for [place] to order a PET scan since patient is not being seen until then.

## 2013-12-04 NOTE — Telephone Encounter (Signed)
Okay given per dr Stanford Breed to order PET scan. Left message for pt to call

## 2013-12-11 ENCOUNTER — Ambulatory Visit (HOSPITAL_COMMUNITY)
Admission: RE | Admit: 2013-12-11 | Discharge: 2013-12-11 | Disposition: A | Discharge status: Home / Self Care | Payer: Medicare HMO | Source: Ambulatory Visit | Attending: Cardiology | Admitting: Cardiology

## 2013-12-11 ENCOUNTER — Encounter (HOSPITAL_COMMUNITY): Payer: Self-pay

## 2013-12-11 DIAGNOSIS — R911 Solitary pulmonary nodule: Secondary | ICD-10-CM | POA: Insufficient documentation

## 2013-12-11 LAB — GLUCOSE, CAPILLARY: GLUCOSE-CAPILLARY: 132 mg/dL — AB (ref 70–99)

## 2013-12-11 MED ORDER — FLUDEOXYGLUCOSE F - 18 (FDG) INJECTION
5.3000 | Freq: Once | INTRAVENOUS | Status: AC | PRN
  Administered 2013-12-11: 5.3 via INTRAVENOUS

## 2013-12-12 ENCOUNTER — Telehealth: Payer: Self-pay | Admitting: Cardiology

## 2013-12-12 ENCOUNTER — Other Ambulatory Visit (HOSPITAL_COMMUNITY): Payer: Commercial Managed Care - HMO

## 2013-12-12 ENCOUNTER — Ambulatory Visit: Payer: Commercial Managed Care - HMO | Admitting: Vascular Surgery

## 2013-12-12 NOTE — Telephone Encounter (Signed)
Noted and PET scan results forwarded to dr bartle for appt.

## 2013-12-12 NOTE — Telephone Encounter (Signed)
This message came from the answering service: Pt wanted you to know that her appt with Dr Cyndia Bent is not until Wednesday morning.

## 2013-12-13 ENCOUNTER — Encounter: Payer: Self-pay | Admitting: Surgery

## 2013-12-13 ENCOUNTER — Institutional Professional Consult (permissible substitution) (INDEPENDENT_AMBULATORY_CARE_PROVIDER_SITE_OTHER): Payer: Commercial Managed Care - HMO | Admitting: Surgery

## 2013-12-13 ENCOUNTER — Other Ambulatory Visit: Payer: Self-pay | Admitting: *Deleted

## 2013-12-13 VITALS — BP 143/65 | HR 63 | Ht 62.0 in | Wt 100.0 lb

## 2013-12-13 DIAGNOSIS — R911 Solitary pulmonary nodule: Secondary | ICD-10-CM

## 2013-12-14 ENCOUNTER — Encounter (HOSPITAL_COMMUNITY): Payer: Self-pay | Admitting: Pharmacy Technician

## 2013-12-15 ENCOUNTER — Encounter: Payer: Self-pay | Admitting: Surgery

## 2013-12-15 NOTE — Progress Notes (Signed)
Cardiothoracic Surgery consultation   PCP is Cathlean Cower, MD Referring Provider is Lelon Perla, MD  Chief Complaint  Patient presents with  . NEW THORACIC    Abnormal CT chest    HPI:  The patient is a 72 year old previous heavy smoker who is know to me from CABG x 3 surgery in 10/2012. She also underwent right carotid endarterectomy at the same time by Dr. Curt Jews. She quit smoking after this. She had a history of bladder cancer and had a CT scan of the abdomen at [facility] in 05/2012 that had shown an 8 mm nodule in the right lung base. She had a follow up chest CT scan on 10/06/2012 that showed no change in the 8 mm nodule in the posterior RLL. A 7 mm pulmonary nodule was seen in the posterior LLL that was not seen on the previous scan. There was also a 6 mm irregular nodule in the superior segment of the LLL and a 6 mm nodule in the posterior RUL adjacent to the major fissure. She had her heart surgery and did not have a follow up scan until 05/09/2013. This showed no change in the 8 mm nodule in the posterior RLL. The previously seen posterior LLL nodule had decreased in size from 7 mm to 5 mm and was less rounded. There was a linear nodular area posteriorly in the RLL that measured 14 mm that was slightly more visible and needed followup. She underwent a CT scan on 11/28/2013 showing that this linear opacity at the right lung base had enlarged further to 2.0 x 1.2 cm. The superior segment RLL nodule seen previously looked similar to slightly more conspicuous. The other small lung nodules that had previously been seen were unchanged. She underwent a PET scan that showed the RLL posterior linear opacity to be hypermetabolic. There was mild hypermetabolic activity in the branching nodular density in the superior segment of the RLL.   Past Medical History  Diagnosis Date  . Positive TB test   . Bladder tumor   . COPD (chronic obstructive pulmonary disease)   . Diabetes mellitus  TYPE II--  PER PCP NOTE DR Cathlean Cower    PER PT DOES NOT TAKES METFORMIN PRESCRIBED, SHE WATCHES DIET AND EXERCISES  . Cancer     bladder  . H/O hiatal hernia     s/p  73 yrs old  . Chronic systolic CHF (congestive heart failure) 08/04/2012    Echo (08/17/12): EF 30%, diffuse HK, mild MR, mild BAE, PASP.;  Echo (01/2013): EF 40-45%, inferior posterior HK, Gr 2 DD, mild MR, mild LAE  . Ischemic cardiomyopathy   . CAD (coronary artery disease)     a. LHC (10/07/12):  Ostial LM 60%, distal LM 40-50%, oLAD 40-50%, mLAD 70-80%, pCFX subtotally occluded, mCFX 80-90%, oRCA occluded, ostial L subclavian 40%, EF 25-30%, abdominal aorta diffusely diseased with severe ulcerated plaquing (right iliac ostial 95%; left renal long 60-70%, right okay). CO 2.1, CI 1.5 => s/p CABG (L-LAD, S-OM, S-PDA) 10/2012 with Dr. Cyndia Bent   . Bilateral carotid artery disease 09/26/2012    carotid US (8/14):  R 80-99; L 60-79 => s/p R CEA 10/2012 (Dr. Donnetta Hutching)  . Lung nodule     chest CT 09/2012 => needs repeat by 03/2013  . PAD (peripheral artery disease)   . Hyperlipidemia   . Hyperkalemia     ACEI d/c'd 11/2012  . Bladder cancer 04/26/2013    Past Surgical History  Procedure Laterality Date  . Tonsillectomy  1959  . Hiatal hernia repair  1964  (AGE 57)    CHOLECYSTECTOMY AND APPENDECTOMY  . Left elbow surgery    . Lumbar disc surgery  1993  . Abdominal hysterectomy  1981 (APPROX)    BILATERAL SALPINGO-OOPHORECTOMY WITH EXCEPTION A SMALL PIECE OF OVARY REMAINS  . Transurethral resection of bladder tumor N/A 06/24/2012    Procedure: TRANSURETHRAL RESECTION OF BLADDER TUMOR  WITH GYRUS AND INSTILLATION OF MITOMYCIN C  (TURBT);  Surgeon: Claybon Jabs, MD;  Location: [facility];  Service: Urology;  Laterality: N/A;  . Appendectomy    . Wrist surgery Left   . Cholecystectomy    . Tonsillectomy    . Cardiac catheterization    . Hammer toe surgery Left   . Coronary artery bypass graft N/A 11/14/2012     Procedure: CORONARY ARTERY BYPASS GRAFTING (CABG) times three using left internal mammary artery and left saphenous vein;  Surgeon: Gaye Pollack, MD;  Location: [place] OR;  Service: Open Heart Surgery;  Laterality: N/A;  . Intraoperative transesophageal echocardiogram N/A 11/14/2012    Procedure: INTRAOPERATIVE TRANSESOPHAGEAL ECHOCARDIOGRAM;  Surgeon: Gaye Pollack, MD;  Location: [facility] OR;  Service: Open Heart Surgery;  Laterality: N/A;  . Endarterectomy Right 11/14/2012    Procedure: RIGHT ENDARTERECTOMY CAROTID with patch angioplasty.;  Surgeon: Rosetta Posner, MD;  Location: [facility] OR;  Service: Vascular;  Laterality: Right;    Family History  Problem Relation Age of Onset  . Cancer Other     colon cancer  . Hypertension Other   . Kidney disease Other   . Diabetes Other   . Heart disease Mother     MI at age 29  . Cervical cancer Mother   . Cancer Mother   . Diabetes Mother   . Hypertension Mother   . Lung cancer Father 70    primary site lung CA then colon and bone  . Colon cancer Father   . Cancer Father   . Hypertension Father     Social History History  Substance Use Topics  . Smoking status: Former Smoker -- 0.20 packs/day for 50 years    Types: Cigarettes    Quit date: 11/13/2012  . Smokeless tobacco: Never Used     Comment: PT HAS BEEN QUITTING--  SMOKES 4 CIG'S PER DAY,  DOWN FROM 1PPD  . Alcohol Use: No     Comment: occasional wine not often    Current Outpatient Prescriptions  Medication Sig Dispense Refill  . aspirin EC 325 MG EC tablet Take 1 tablet (325 mg total) by mouth daily.  30 tablet  0  . carvedilol (COREG) 12.5 MG tablet Take 1.5 tablets (18.75 mg total) by mouth 2 (two) times daily with a meal.  270 tablet  3  . metFORMIN (GLUCOPHAGE) 500 MG tablet Take 1 tablet (500 mg total) by mouth 2 (two) times daily with a meal.  180 tablet  3  . Multiple Vitamin (MULTIVITAMIN) tablet Take 1 tablet by mouth daily.      . Omega-3 Fatty Acids (FISH OIL) 1000 MG CAPS  Take 1,000 mg by mouth daily.       . furosemide (LASIX) 20 MG tablet Take 20 mg by mouth daily.      [name] glucose blood (ACCU-CHEK SMARTVIEW) test strip 1 each by Other route See admin instructions. Check blood sugar 3 times daily.      . hydrALAZINE (APRESOLINE) 25 MG tablet Take  25 mg by mouth 3 (three) times daily.      . Hypromellose (ARTIFICIAL TEARS OP) Place 1 drop into both eyes 4 (four) times daily as needed (for dry eyes).      . isosorbide mononitrate (IMDUR) 30 MG 24 hr tablet Take 30 mg by mouth daily.      . pravastatin (PRAVACHOL) 40 MG tablet Take 40 mg by mouth every evening.       No current facility-administered medications for this visit.    No Known Allergies  Review of Systems  Constitutional: Negative for activity change, appetite change, fatigue and unexpected weight change.  HENT: Negative.   Eyes: Negative.   Respiratory: Negative for cough, chest tightness and wheezing.   Cardiovascular: Negative.   Gastrointestinal: Negative.   Endocrine: Negative.   Genitourinary: Negative.   Musculoskeletal: Negative.   Skin: Negative.   Allergic/Immunologic: Negative.   Neurological: Negative.   Hematological: Negative.   Psychiatric/Behavioral: Negative.     BP 143/65  Pulse 63  Ht 5\' 2"  (1.575 m)  Wt 100 lb (45.36 kg)  BMI 18.29 kg/m2  SpO2 99% Physical Exam  Constitutional: She is oriented to person, place, and time.  Elderly, thin woman in no distress.  HENT:  Head: Normocephalic and atraumatic.  Mouth/Throat: Oropharynx is clear and moist.  Eyes: EOM are normal. Pupils are equal, round, and reactive to light.  Neck: Normal range of motion. Neck supple. No JVD present. No thyromegaly present.  No supraclavicular adenopathy  Cardiovascular: Normal rate, regular rhythm and intact distal pulses.   No murmur heard. Pulmonary/Chest: Effort normal and breath sounds normal. No respiratory distress.  Well-healed sternotomy scar  Abdominal: Soft. Bowel sounds  are normal. She exhibits no distension and no mass. There is no tenderness.  Musculoskeletal: She exhibits edema.  Lymphadenopathy:    She has no cervical adenopathy.  Neurological: She is alert and oriented to person, place, and time. She has normal strength. No cranial nerve deficit or sensory deficit.  Skin: Skin is warm and dry.  Psychiatric: She has a normal mood and affect.     Diagnostic Tests:  CLINICAL DATA: Followup of lung nodule. History of bladder cancer.  EXAM:  CT CHEST WITHOUT CONTRAST  TECHNIQUE:  Multidetector CT imaging of the chest was performed following the  standard protocol without IV contrast.  COMPARISON: 05/09/2013  FINDINGS:  Lungs/Pleura: Moderate centrilobular and mild is paraseptal  emphysema.  right right upper lobe granuloma.  Subpleural right lower lobe lung nodule is unchanged at 7 mm on  image 39.  The linear opacity at the right lung base has enlarged. Measures 2.0  x 1.2 cm on image 47 versus 1.4 by 0.5 cm on the prior exam.  Right lower lobe in apparent interstitial thickening on image 32 of  series 3 is secondary to a 5 mm nodule on sagittal image 25. This is  similar to minimally enlarged from sagittal image 21 of the prior.  Mild subpleural reticulation at the lung bases.  No pleural fluid.  Heart/Mediastinum: Prior median sternotomy. Aortic and branch vessel  atherosclerosis. Normal heart size, without pericardial effusion.  Calcified middle mediastinal nodes. No mediastinal or definite hilar  adenopathy, given limitations of unenhanced CT.  Upper Abdomen: Normal imaged portions of the liver, spleen, stomach,  pancreas, adrenal glands. Advanced aortic and branch vessel  atherosclerosis throughout the abdomen.  Bones/Musculoskeletal: Moderate osteopenia. No acute osseous  abnormality.  IMPRESSION:  1. Interval enlargement of right lower lobe lung nodule, highly  suspicious for primary bronchogenic carcinoma. Thoracic surgery    consultation and/or PET suggested. These results will be called to  the ordering clinician or representative by the Radiologist  Assistant, and communication documented in the PACS or zVision  Dashboard.  2. Other pulmonary nodules which are likely similar. The superior  segment right lower lobe nodule is similar to slightly more  conspicuous today. Recommend attention on follow-up.  3. Centrilobular emphysema. Cannot exclude concurrent Mild usual  interstitial pneumonia at the bases.  4. Advanced atherosclerosis.  Electronically Signed  By: Abigail Miyamoto M.D.  On: 11/28/2013 14:42       CLINICAL DATA: Initial treatment strategy for pulmonary nodule.  EXAM:  NUCLEAR MEDICINE PET SKULL BASE TO THIGH  TECHNIQUE:  5.3 mCi F-18 FDG was injected intravenously. Full-ring PET imaging  was performed from the skull base to thigh after the radiotracer. CT  data was obtained and used for attenuation correction and anatomic  localization.  FASTING BLOOD GLUCOSE: Value: 132 mg/dl  COMPARISON: CT 11/28/2013  FINDINGS:  NECK  No hypermetabolic lymph nodes in the neck.  CHEST  The enlarging right lower lobe pulmonary nodule of concern of  measures 17 mm x 12 mm (image 56, series 6) has intense metabolic  activity with SUV max 6.8.  A focus of nodular branching in the more superior aspect of the  right lower lobe measuring 18 mm has faint metabolic activity.  No hypermetabolic mediastinal lymph nodes. There are calcified  mediastinal lymph nodes present. No axillary supraclavicular  metabolic adenopathy.  ABDOMEN/PELVIS  No abnormal hypermetabolic activity within the liver, pancreas,  adrenal glands, or spleen. No hypermetabolic lymph nodes in the  abdomen or pelvis.  SKELETON  No focal hypermetabolic activity to suggest skeletal metastasis.  IMPRESSION:  1. Hypermetabolic right lower lobe pulmonary nodule is most  consistent with primary bronchogenic carcinoma.  2. No evidence of  mediastinal nodal metastasis or distant  metastasis.  3. Focus of branching nodular density in the right lower lobe with  very mild metabolic activity is indeterminate. Recommend attention  on follow-up.  4. Staging by FDG PET imaging T1a N0 M0.  Electronically Signed  By: Suzy Bouchard M.D.  On: 12/11/2013 15:50         Impression:  She has an enlarging posterior RLL lung nodule that is hypermetabolic on PET scan and suspicious for a lung cancer. There is a second ground glass opacity in the superior segment of the RLL that could be a slow growing adenocarcinoma since it has mild indeterminate metabolic activity. She does have a history of other lung nodules so it is still possible that this is an inflammatory or infectious process. She is 50 with a history of CABG and CEA and had PFT's a year ago showing a moderate obstructive defect and a severe diffusion defect. I think we should repeat her PFT's and do a CT guided needle biopsy to try to confirm that this is a cancer before putting her through a lung resection, if her PFT's show that she is a candidate. I reviewed the scans and my recommendations with her and she is in agreement.   Plan:  1. CT guided needle biopsy of the RLL nodule  2. PFT's with diffusion capacity  3. Return to see me afterward to discuss further treatment.

## 2013-12-18 ENCOUNTER — Ambulatory Visit (HOSPITAL_COMMUNITY)
Admission: RE | Admit: 2013-12-18 | Discharge: 2013-12-18 | Disposition: A | Discharge status: Home / Self Care | Payer: Medicare HMO | Source: Ambulatory Visit | Attending: Surgery | Admitting: Surgery

## 2013-12-18 DIAGNOSIS — R911 Solitary pulmonary nodule: Secondary | ICD-10-CM | POA: Diagnosis not present

## 2013-12-18 DIAGNOSIS — Z720 Tobacco use: Secondary | ICD-10-CM | POA: Insufficient documentation

## 2013-12-18 LAB — PULMONARY FUNCTION TEST
DL/VA % pred: 41 %
DL/VA: 1.88 ml/min/mmHg/L
DLCO COR % PRED: 32 %
DLCO cor: 6.97 ml/min/mmHg
DLCO unc % pred: 32 %
DLCO unc: 6.97 ml/min/mmHg
FEF 25-75 POST: 0.43 L/s
FEF 25-75 Pre: 0.55 L/sec
FEF2575-%CHANGE-POST: -22 %
FEF2575-%PRED-PRE: 34 %
FEF2575-%Pred-Post: 27 %
FEV1-%Change-Post: -5 %
FEV1-%PRED-POST: 63 %
FEV1-%Pred-Pre: 67 %
FEV1-POST: 1.24 L
FEV1-Pre: 1.32 L
FEV1FVC-%CHANGE-POST: 3 %
FEV1FVC-%Pred-Pre: 77 %
FEV6-%CHANGE-POST: -6 %
FEV6-%Pred-Post: 82 %
FEV6-%Pred-Pre: 89 %
FEV6-PRE: 2.2 L
FEV6-Post: 2.05 L
FEV6FVC-%Change-Post: 1 %
FEV6FVC-%Pred-Post: 104 %
FEV6FVC-%Pred-Pre: 102 %
FVC-%Change-Post: -8 %
FVC-%PRED-POST: 79 %
FVC-%Pred-Pre: 87 %
FVC-PRE: 2.27 L
FVC-Post: 2.07 L
POST FEV1/FVC RATIO: 60 %
POST FEV6/FVC RATIO: 99 %
PRE FEV1/FVC RATIO: 58 %
Pre FEV6/FVC Ratio: 97 %
RV % PRED: 124 %
RV: 2.7 L
TLC % pred: 109 %
TLC: 5.19 L

## 2013-12-18 MED ORDER — ALBUTEROL SULFATE (2.5 MG/3ML) 0.083% IN NEBU
2.5000 mg | INHALATION_SOLUTION | Freq: Once | RESPIRATORY_TRACT | Status: AC
  Administered 2013-12-18: 2.5 mg via RESPIRATORY_TRACT

## 2013-12-22 ENCOUNTER — Other Ambulatory Visit: Payer: Self-pay | Admitting: Radiology

## 2013-12-25 ENCOUNTER — Other Ambulatory Visit: Payer: Self-pay | Admitting: Radiology

## 2013-12-25 ENCOUNTER — Encounter: Payer: Self-pay | Admitting: Vascular Surgery

## 2013-12-26 ENCOUNTER — Other Ambulatory Visit: Payer: Self-pay | Admitting: Radiology

## 2013-12-26 ENCOUNTER — Encounter: Payer: Self-pay | Admitting: Vascular Surgery

## 2013-12-26 ENCOUNTER — Ambulatory Visit (HOSPITAL_COMMUNITY)
Admission: RE | Admit: 2013-12-26 | Discharge: 2013-12-26 | Disposition: A | Discharge status: Home / Self Care | Payer: Medicare HMO | Source: Ambulatory Visit | Attending: Vascular Surgery | Admitting: Vascular Surgery

## 2013-12-26 ENCOUNTER — Ambulatory Visit (INDEPENDENT_AMBULATORY_CARE_PROVIDER_SITE_OTHER): Payer: Commercial Managed Care - HMO | Admitting: Vascular Surgery

## 2013-12-26 DIAGNOSIS — Z48812 Encounter for surgical aftercare following surgery on the circulatory system: Secondary | ICD-10-CM

## 2013-12-26 DIAGNOSIS — I6529 Occlusion and stenosis of unspecified carotid artery: Secondary | ICD-10-CM | POA: Insufficient documentation

## 2013-12-26 DIAGNOSIS — I6523 Occlusion and stenosis of bilateral carotid arteries: Secondary | ICD-10-CM | POA: Diagnosis not present

## 2013-12-26 NOTE — Progress Notes (Signed)
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 Is status post right carotid endarterectomy combined with coronary artery bypass grafting with Dr. Caffie Pinto in September 2014. She denies any neurologic deficits and also denies any new cardiac difficulty. She does have a periodicity and her long and is scheduled for needle biopsy tomorrow. PET scan showed some activity at this area  Past Medical History  Diagnosis Date  . Positive TB test   . Bladder tumor   . COPD (chronic obstructive pulmonary disease)   . Diabetes mellitus TYPE II--  PER PCP NOTE DR Cathlean Cower    PER PT DOES NOT TAKES METFORMIN PRESCRIBED, SHE WATCHES DIET AND EXERCISES  . Cancer     bladder  . H/O hiatal hernia     s/p  73 yrs old  . Chronic systolic CHF (congestive heart failure) 08/04/2012    Echo (08/17/12): EF 30%, diffuse HK, mild MR, mild BAE, PASP.;  Echo (01/2013): EF 40-45%, inferior posterior HK, Gr 2 DD, mild MR, mild LAE  . Ischemic cardiomyopathy   . CAD (coronary artery disease)     a. LHC (10/07/12):  Ostial LM 60%, distal LM 40-50%, oLAD 40-50%, mLAD 70-80%, pCFX subtotally occluded, mCFX 80-90%, oRCA occluded, ostial L subclavian 40%, EF 25-30%, abdominal aorta diffusely diseased with severe ulcerated plaquing (right iliac ostial 95%; left renal long 60-70%, right okay). CO 2.1, CI 1.5 => s/p CABG (L-LAD, S-OM, S-PDA) 10/2012 with Dr. Cyndia Bent   . Bilateral carotid artery disease 09/26/2012    carotid US (8/14):  R 80-99; L 60-79 => s/p R CEA 10/2012 (Dr. Donnetta Hutching)  . Lung nodule     chest CT 09/2012 => needs repeat by 03/2013  . PAD (peripheral artery disease)   . Hyperlipidemia   . Hyperkalemia     ACEI d/c'd 11/2012  . Bladder cancer 04/26/2013    History  Substance Use Topics  . Smoking status: Former Smoker -- 0.20 packs/day for 50 years    Types: Cigarettes    Quit date: 11/13/2012  . Smokeless tobacco: Never Used     Comment: PT HAS BEEN QUITTING--  SMOKES 4 CIG'S  PER DAY,  DOWN FROM 1PPD  . Alcohol Use: No     Comment: occasional wine not often    Family History  Problem Relation Age of Onset  . Cancer Other     colon cancer  . Hypertension Other   . Kidney disease Other   . Diabetes Other   . Heart disease Mother     MI at age 32  . Cervical cancer Mother   . Cancer Mother   . Diabetes Mother   . Hypertension Mother   . Lung cancer Father 59    primary site lung CA then colon and bone  . Colon cancer Father   . Cancer Father   . Hypertension Father     No Known Allergies  Current outpatient prescriptions: aspirin EC 325 MG EC tablet, Take 1 tablet (325 mg total) by mouth daily., Disp: 30 tablet, Rfl: 0;  carvedilol (COREG) 12.5 MG tablet, Take 1.5 tablets (18.75 mg total) by mouth 2 (two) times daily with a meal., Disp: 270 tablet, Rfl: 3;  furosemide (LASIX) 20 MG tablet, Take 20 mg by mouth daily., Disp: , Rfl:  glucose blood (ACCU-CHEK SMARTVIEW) test strip, 1 each by Other route See admin instructions. Check blood sugar 3 times daily., Disp: , Rfl: ;  hydrALAZINE (APRESOLINE) 25 MG tablet, Take 25 mg  by mouth 3 (three) times daily., Disp: , Rfl: ;  Hypromellose (ARTIFICIAL TEARS OP), Place 1 drop into both eyes 4 (four) times daily as needed (for dry eyes)., Disp: , Rfl:  isosorbide mononitrate (IMDUR) 30 MG 24 hr tablet, Take 30 mg by mouth daily., Disp: , Rfl: ;  metFORMIN (GLUCOPHAGE) 500 MG tablet, Take 1 tablet (500 mg total) by mouth 2 (two) times daily with a meal., Disp: 180 tablet, Rfl: 3;  Multiple Vitamin (MULTIVITAMIN) tablet, Take 1 tablet by mouth daily., Disp: , Rfl: ;  Omega-3 Fatty Acids (FISH OIL) 1000 MG CAPS, Take 1,000 mg by mouth daily. , Disp: , Rfl:  pravastatin (PRAVACHOL) 40 MG tablet, Take 40 mg by mouth every evening., Disp: , Rfl:   BP 174/46 mmHg  Pulse 71  Ht 5\' 2"  (1.575 m)  Wt 104 lb (47.174 kg)  BMI 19.02 kg/m2  SpO2 98%  Body mass index is 19.02 kg/(m^2).       Physical exam well-developed  well-nourished white female in no acute distress Neurologically she is grossly intact Heart regular rate and rhythm without murmur Harsh bilateral carotid bruits Well-healed right carotid incision  She underwent repeat carotid duplex today in our office and I reviewed the results with her. This does show widely patent endarterectomy on the right. There is some stenosis in her proximal common carotid artery on the right. Left internal carotid artery stenosis 60-79%-range  Impression and plan stable asymptomatic carotid disease. Recommend continued six-month follow-up. She has notify us should she develop any neurologic deficits

## 2013-12-27 ENCOUNTER — Ambulatory Visit (HOSPITAL_COMMUNITY)
Admission: RE | Admit: 2013-12-27 | Discharge: 2013-12-27 | Disposition: A | Discharge status: Home / Self Care | Payer: Medicare HMO | Source: Ambulatory Visit | Attending: Surgery | Admitting: Surgery

## 2013-12-27 ENCOUNTER — Encounter (HOSPITAL_COMMUNITY): Payer: Self-pay

## 2013-12-27 DIAGNOSIS — J939 Pneumothorax, unspecified: Secondary | ICD-10-CM

## 2013-12-27 DIAGNOSIS — I6523 Occlusion and stenosis of bilateral carotid arteries: Secondary | ICD-10-CM | POA: Diagnosis not present

## 2013-12-27 DIAGNOSIS — R911 Solitary pulmonary nodule: Secondary | ICD-10-CM

## 2013-12-27 LAB — APTT: APTT: 31 s (ref 24–37)

## 2013-12-27 LAB — CBC
HCT: 30.9 % — ABNORMAL LOW (ref 36.0–46.0)
Hemoglobin: 10.7 g/dL — ABNORMAL LOW (ref 12.0–15.0)
MCH: 31.6 pg (ref 26.0–34.0)
MCHC: 34.6 g/dL (ref 30.0–36.0)
MCV: 91.2 fL (ref 78.0–100.0)
PLATELETS: 237 10*3/uL (ref 150–400)
RBC: 3.39 MIL/uL — ABNORMAL LOW (ref 3.87–5.11)
RDW: 14.2 % (ref 11.5–15.5)
WBC: 9.8 10*3/uL (ref 4.0–10.5)

## 2013-12-27 LAB — PROTIME-INR
INR: 1.08 (ref 0.00–1.49)
Prothrombin Time: 14.2 seconds (ref 11.6–15.2)

## 2013-12-27 LAB — GLUCOSE, CAPILLARY
GLUCOSE-CAPILLARY: 153 mg/dL — AB (ref 70–99)
GLUCOSE-CAPILLARY: 99 mg/dL (ref 70–99)

## 2013-12-27 MED ORDER — FENTANYL CITRATE 0.05 MG/ML IJ SOLN
INTRAMUSCULAR | Status: AC
  Filled 2013-12-27: qty 4

## 2013-12-27 MED ORDER — FENTANYL CITRATE 0.05 MG/ML IJ SOLN
INTRAMUSCULAR | Status: AC | PRN
  Administered 2013-12-27 (×2): 25 ug via INTRAVENOUS

## 2013-12-27 MED ORDER — LIDOCAINE HCL 1 % IJ SOLN
INTRAMUSCULAR | Status: AC
  Filled 2013-12-27: qty 20

## 2013-12-27 MED ORDER — MIDAZOLAM HCL 2 MG/2ML IJ SOLN
INTRAMUSCULAR | Status: AC
  Filled 2013-12-27: qty 4

## 2013-12-27 MED ORDER — MIDAZOLAM HCL 2 MG/2ML IJ SOLN
INTRAMUSCULAR | Status: AC | PRN
  Administered 2013-12-27: 1 mg via INTRAVENOUS

## 2013-12-27 MED ORDER — SODIUM CHLORIDE 0.9 % IV SOLN
INTRAVENOUS | Status: DC
  Administered 2013-12-27: 14:00:00 via INTRAVENOUS

## 2013-12-27 MED ORDER — SODIUM CHLORIDE 0.9 % IV SOLN
Freq: Once | INTRAVENOUS | Status: AC
  Administered 2013-12-27: 10:00:00 via INTRAVENOUS

## 2013-12-27 NOTE — Addendum Note (Signed)
Addended by: Mena Goes on: 12/27/2013 02:11 PM   Modules accepted: Orders

## 2013-12-27 NOTE — Progress Notes (Signed)
Assumed care of pt from Crystal Lamb, RN. Assessment documented. 

## 2013-12-27 NOTE — Discharge Instructions (Signed)
Needle Biopsy of Lung, Care After °Refer to this sheet in the next few weeks. These instructions provide you with information on caring for yourself after your procedure. Your health care provider may also give you more specific instructions. Your treatment has been planned according to current medical practices, but problems sometimes occur. Call your health care provider if you have any problems or questions after your procedure. °WHAT TO EXPECT AFTER THE PROCEDURE °· A bandage will be applied over the area where the needle was inserted. You may be asked to apply pressure to the bandage for several minutes to ensure there is minimal bleeding. °· In most cases, you can leave when your needle biopsy procedure is completed. Do not drive yourself home. Someone else should take you home. °· If you received an IV sedative or general anesthetic, you will be taken to a comfortable place to relax while the medicine wears off. °· If you have upcoming travel scheduled, talk to your health care provider about when it is safe to travel by air after the procedure. °HOME CARE INSTRUCTIONS °· Expect to take it easy for the rest of the day. °· Protect the area where you received the needle biopsy by keeping the bandage in place for as long as instructed. °· You may feel some mild pain or discomfort in the area, but this should stop in a day or two. °· Take medicines only as directed by your health care provider. °SEEK MEDICAL CARE IF:  °· You have pain at the biopsy site that worsens or is not helped by medicine. °· You have swelling or drainage at the needle biopsy site. °· You have a fever. °SEEK IMMEDIATE MEDICAL CARE IF:  °· You have new or worsening shortness of breath. °· You have chest pain. °· You are coughing up blood. °· You have bleeding that does not stop with pressure or a bandage. °· You develop light-headedness or fainting. °Document Released: 12/07/2006 Document Revised: 06/26/2013 Document Reviewed:  07/04/2012 °[employer]® Patient Information ©2015 [employer]. This information is not intended to replace advice given to you by your health care provider. Make sure you discuss any questions you have with your health care provider. ° °

## 2013-12-27 NOTE — H&P (Signed)
Chief Complaint: Rt lung mass +PET  Referring Physician(s): Bartle,Bryan K  History of Present Illness: Tonya Valdez is a 73 y.o. female  Pt with hx bladder cancer 2014 Hx CAD/CABG; carotid endarterectomy CXR 2014 revealed small Rt lung nodule Followed 4-6 months without change CT scan 09/2013 shows enlarging nodule +PET 12/11/13 Now scheduled for biopsy of same Still smokes occasionally   Past Medical History  Diagnosis Date  . Positive TB test   . Bladder tumor   . COPD (chronic obstructive pulmonary disease)   . Diabetes mellitus TYPE II--  PER PCP NOTE DR Cathlean Cower    PER PT DOES NOT TAKES METFORMIN PRESCRIBED, SHE WATCHES DIET AND EXERCISES  . Cancer     bladder  . H/O hiatal hernia     s/p  73 yrs old  . Chronic systolic CHF (congestive heart failure) 08/04/2012    Echo (08/17/12): EF 30%, diffuse HK, mild MR, mild BAE, PASP.;  Echo (01/2013): EF 40-45%, inferior posterior HK, Gr 2 DD, mild MR, mild LAE  . Ischemic cardiomyopathy   . CAD (coronary artery disease)     a. LHC (10/07/12):  Ostial LM 60%, distal LM 40-50%, oLAD 40-50%, mLAD 70-80%, pCFX subtotally occluded, mCFX 80-90%, oRCA occluded, ostial L subclavian 40%, EF 25-30%, abdominal aorta diffusely diseased with severe ulcerated plaquing (right iliac ostial 95%; left renal long 60-70%, right okay). CO 2.1, CI 1.5 => s/p CABG (L-LAD, S-OM, S-PDA) 10/2012 with Dr. Cyndia Bent   . Bilateral carotid artery disease 09/26/2012    carotid US (8/14):  R 80-99; L 60-79 => s/p R CEA 10/2012 (Dr. Donnetta Hutching)  . Lung nodule     chest CT 09/2012 => needs repeat by 03/2013  . PAD (peripheral artery disease)   . Hyperlipidemia   . Hyperkalemia     ACEI d/c'd 11/2012  . Bladder cancer 04/26/2013    Past Surgical History  Procedure Laterality Date  . Tonsillectomy  1959  . Hiatal hernia repair  1964  (AGE 40)    CHOLECYSTECTOMY AND APPENDECTOMY  . Left elbow surgery    . Lumbar disc surgery  1993  . Abdominal hysterectomy  1981  (APPROX)    BILATERAL SALPINGO-OOPHORECTOMY WITH EXCEPTION A SMALL PIECE OF OVARY REMAINS  . Transurethral resection of bladder tumor N/A 06/24/2012    Procedure: TRANSURETHRAL RESECTION OF BLADDER TUMOR  WITH GYRUS AND INSTILLATION OF MITOMYCIN C  (TURBT);  Surgeon: Claybon Jabs, MD;  Location: [facility];  Service: Urology;  Laterality: N/A;  . Appendectomy    . Wrist surgery Left   . Cholecystectomy    . Tonsillectomy    . Cardiac catheterization    . Hammer toe surgery Left   . Coronary artery bypass graft N/A 11/14/2012    Procedure: CORONARY ARTERY BYPASS GRAFTING (CABG) times three using left internal mammary artery and left saphenous vein;  Surgeon: Gaye Pollack, MD;  Location: [place] [facility];  Service: Open Heart Surgery;  Laterality: N/A;  . Intraoperative transesophageal echocardiogram N/A 11/14/2012    Procedure: INTRAOPERATIVE TRANSESOPHAGEAL ECHOCARDIOGRAM;  Surgeon: Gaye Pollack, MD;  Location: [facility] OR;  Service: Open Heart Surgery;  Laterality: N/A;  . Endarterectomy Right 11/14/2012    Procedure: RIGHT ENDARTERECTOMY CAROTID with patch angioplasty.;  Surgeon: Rosetta Posner, MD;  Location: [place];  Service: Vascular;  Laterality: Right;    Allergies: Review of patient's allergies indicates no known allergies.  Medications: Prior to Admission medications   Medication Sig Start  Date End Date Taking? Authorizing Provider  aspirin EC 325 MG EC tablet Take 1 tablet (325 mg total) by mouth daily. 11/19/12  Yes Erin Barrett, PA-C  carvedilol (COREG) 12.5 MG tablet Take 1.5 tablets (18.75 mg total) by mouth 2 (two) times daily with a meal. 11/27/13  Yes Lelon Perla, MD  furosemide (LASIX) 20 MG tablet Take 20 mg by mouth daily.   Yes Historical Provider, MD  glucose blood (ACCU-CHEK SMARTVIEW) test strip 1 each by Other route See admin instructions. Check blood sugar 3 times daily.   Yes Historical Provider, MD  hydrALAZINE (APRESOLINE) 25 MG tablet Take 25 mg by mouth 3  (three) times daily.   Yes Historical Provider, MD  Hypromellose (ARTIFICIAL TEARS OP) Place 1 drop into both eyes 4 (four) times daily as needed (for dry eyes).   Yes Historical Provider, MD  isosorbide mononitrate (IMDUR) 30 MG 24 hr tablet Take 30 mg by mouth daily.   Yes Historical Provider, MD  metFORMIN (GLUCOPHAGE) 500 MG tablet Take 1 tablet (500 mg total) by mouth 2 (two) times daily with a meal. 11/07/13  Yes Biagio Borg, MD  Multiple Vitamin (MULTIVITAMIN) tablet Take 1 tablet by mouth daily.   Yes Historical Provider, MD  Omega-3 Fatty Acids (FISH OIL) 1000 MG CAPS Take 1,000 mg by mouth daily.    Yes Historical Provider, MD  pravastatin (PRAVACHOL) 40 MG tablet Take 40 mg by mouth every evening.   Yes Historical Provider, MD    Family History  Problem Relation Age of Onset  . Cancer Other     colon cancer  . Hypertension Other   . Kidney disease Other   . Diabetes Other   . Heart disease Mother     MI at age 36  . Cervical cancer Mother   . Cancer Mother   . Diabetes Mother   . Hypertension Mother   . Lung cancer Father 96    primary site lung CA then colon and bone  . Colon cancer Father   . Cancer Father   . Hypertension Father     History   Social History  . Marital Status: Widowed    Spouse Name: N/A    Number of Children: 2  . Years of Education: 16   Occupational History  . RN, BSN    Social History Main Topics  . Smoking status: Former Smoker -- 0.20 packs/day for 50 years    Types: Cigarettes    Quit date: 11/13/2012  . Smokeless tobacco: Never Used     Comment: PT HAS BEEN QUITTING--  SMOKES 4 CIG'S PER DAY,  DOWN FROM 1PPD  . Alcohol Use: No     Comment: occasional wine not often  . Drug Use: No  . Sexual Activity: None   Other Topics Concern  . None   Social History Narrative    Review of Systems: A 12 point ROS discussed and pertinent positives are indicated in the HPI above.  All other systems are negative.  Review of Systems    Constitutional: Negative for activity change, appetite change, fatigue and unexpected weight change.  Respiratory: Positive for cough. Negative for shortness of breath.   Cardiovascular: Negative for chest pain.  Gastrointestinal: Negative for abdominal pain.  Genitourinary: Negative for difficulty urinating.  Musculoskeletal: Negative for back pain.  Neurological: Negative for weakness.  Psychiatric/Behavioral: Negative for behavioral problems and confusion.    Vital Signs: BP 131/48 mmHg  Pulse 62  Temp(Src) 97.6 F (  36.4 C) (Oral)  Resp 18  Ht 5\' 2"  (1.575 m)  Wt 45.36 kg (100 lb)  BMI 18.29 kg/m2  SpO2 97%  Physical Exam  Constitutional: She is oriented to person, place, and time.  thin  Cardiovascular: Normal rate and regular rhythm.   No murmur heard. Pulmonary/Chest: Effort normal and breath sounds normal. She has no wheezes.  Abdominal: Soft. Bowel sounds are normal.  Musculoskeletal: Normal range of motion.  Neurological: She is alert and oriented to person, place, and time.  Skin: Skin is warm and dry.  Psychiatric: She has a normal mood and affect. Her behavior is normal. Judgment and thought content normal.  Nursing note and vitals reviewed.   Imaging: Ct Chest Wo Contrast  11/28/2013   CLINICAL DATA:  Followup of lung nodule.  History of bladder cancer.  EXAM: CT CHEST WITHOUT CONTRAST  TECHNIQUE: Multidetector CT imaging of the chest was performed following the standard protocol without IV contrast.  COMPARISON:  05/09/2013  FINDINGS: Lungs/Pleura: Moderate centrilobular and mild is paraseptal emphysema.  right right upper lobe granuloma.  Subpleural right lower lobe lung nodule is unchanged at 7 mm on image 39.  The linear opacity at the right lung base has enlarged. Measures 2.0 x 1.2 cm on image 47 versus 1.4 by 0.5 cm on the prior exam.  Right lower lobe in apparent interstitial thickening on image 32 of series 3 is secondary to a 5 mm nodule on sagittal image  25. This is similar to minimally enlarged from sagittal image 21 of the prior.  Mild subpleural reticulation at the lung bases.  No pleural fluid.  Heart/Mediastinum: Prior median sternotomy. Aortic and branch vessel atherosclerosis. Normal heart size, without pericardial effusion. Calcified middle mediastinal nodes. No mediastinal or definite hilar adenopathy, given limitations of unenhanced CT.  Upper Abdomen: Normal imaged portions of the liver, spleen, stomach, pancreas, adrenal glands. Advanced aortic and branch vessel atherosclerosis throughout the abdomen.  Bones/Musculoskeletal: Moderate osteopenia. No acute osseous abnormality.  IMPRESSION: 1. Interval enlargement of right lower lobe lung nodule, highly suspicious for primary bronchogenic carcinoma. Thoracic surgery consultation and/or PET suggested. These results will be called to the ordering clinician or representative by the Radiologist Assistant, and communication documented in the PACS or zVision Dashboard. 2. Other pulmonary nodules which are likely similar. The superior segment right lower lobe nodule is similar to slightly more conspicuous today. Recommend attention on follow-up. 3. Centrilobular emphysema. Cannot exclude concurrent Mild usual interstitial pneumonia at the bases. 4. Advanced atherosclerosis.   Electronically Signed   By: Abigail Miyamoto M.D.   On: 11/28/2013 14:42   Nm Pet Image Initial (pi) Skull Base To Thigh  12/11/2013   CLINICAL DATA:  Initial treatment strategy for pulmonary nodule.  EXAM: NUCLEAR MEDICINE PET SKULL BASE TO THIGH  TECHNIQUE: 5.3 mCi F-18 FDG was injected intravenously. Full-ring PET imaging was performed from the skull base to thigh after the radiotracer. CT data was obtained and used for attenuation correction and anatomic localization.  FASTING BLOOD GLUCOSE:  Value: 132 mg/dl  COMPARISON:  CT 11/28/2013  FINDINGS: NECK  No hypermetabolic lymph nodes in the neck.  CHEST  The enlarging right lower lobe  pulmonary nodule of concern of measures 17 mm x 12 mm (image 56, series 6) has intense metabolic activity with SUV max 6.8.  A focus of nodular branching in the more superior aspect of the right lower lobe measuring 18 mm has faint metabolic activity.  No hypermetabolic mediastinal lymph nodes. There  are calcified mediastinal lymph nodes present. No axillary supraclavicular metabolic adenopathy.  ABDOMEN/PELVIS  No abnormal hypermetabolic activity within the liver, pancreas, adrenal glands, or spleen. No hypermetabolic lymph nodes in the abdomen or pelvis.  SKELETON  No focal hypermetabolic activity to suggest skeletal metastasis.  IMPRESSION: 1. Hypermetabolic right lower lobe pulmonary nodule is most consistent with primary bronchogenic carcinoma. 2. No evidence of mediastinal nodal metastasis or distant metastasis. 3. Focus of branching nodular density in the right lower lobe with very mild metabolic activity is indeterminate. Recommend attention on follow-up. 4. Staging by FDG PET imaging T1a N0 M0.   Electronically Signed   By: Suzy Bouchard M.D.   On: 12/11/2013 15:50    Labs:  CBC:  Recent Labs  12/27/13 0941  WBC 9.8  HGB 10.7*  HCT 30.9*  PLT 237    COAGS: No results for input(s): INR, APTT in the last 8760 hours.  BMP:  Recent Labs  01/02/13 1034 01/10/13 1423 11/01/13 1550 11/27/13 0950  NA 136 134* 136 139  K 4.5 3.6 3.5 4.2  CL 102 97 99 103  CO2 25 30 28 29   GLUCOSE 137* 184* 98 138*  BUN 18 16 10 15   CALCIUM 9.5 9.7 9.4 9.2  CREATININE 0.7 0.7 0.8 0.77  GFRNONAA  --   --   --  76  GFRAA  --   --   --  88    LIVER FUNCTION TESTS:  Recent Labs  01/02/13 1034 11/01/13 1550  BILITOT 0.6 0.5  AST 25 28  ALT 15 16  ALKPHOS 81 63  PROT 7.2 7.2  ALBUMIN 3.5 3.8    TUMOR MARKERS: No results for input(s): AFPTM, CEA, CA199, CHROMGRNA in the last 8760 hours.  Assessment and Plan:  Rt lung mass +PET Hx bladder cancer Scheduled for bx of lung mass Pt  aware of procedure benefits and risks and agreeable to proceed Consent signed andin chart  Thank you for this interesting consult.  I greatly enjoyed meeting Tonya Valdez and look forward to participating in their care.    I spent a total of 20 minutes face to face in clinical consultation, greater than 50% of which was counseling/coordinating care for Rt lung mass biopsy  Signed: Ladarrius Bogdanski A 12/27/2013, 10:53 AM

## 2013-12-27 NOTE — Progress Notes (Signed)
Pt states that she ate a muffin this am at 0800. Pam Turpin,P.A. Notified. Procedure will be postponed several hours per Dr. Kathlene Cote

## 2014-01-03 ENCOUNTER — Ambulatory Visit (INDEPENDENT_AMBULATORY_CARE_PROVIDER_SITE_OTHER): Payer: Commercial Managed Care - HMO | Admitting: Surgery

## 2014-01-03 ENCOUNTER — Encounter: Payer: Self-pay | Admitting: Surgery

## 2014-01-03 VITALS — BP 144/55 | HR 55 | Resp 16 | Ht 62.0 in | Wt 100.0 lb

## 2014-01-03 DIAGNOSIS — R911 Solitary pulmonary nodule: Secondary | ICD-10-CM

## 2014-01-05 ENCOUNTER — Encounter: Payer: Self-pay | Admitting: Surgery

## 2014-01-05 NOTE — Progress Notes (Signed)
      HPI:  She returns today for follow up after a CT guided needle biopsy of a RLL lung nodule that is hypermetabolic on PET scan and suspicious for cancer. The biopsy showed no sign of atypia or cancer.  Current Outpatient Prescriptions  Medication Sig Dispense Refill  . aspirin EC 325 MG EC tablet Take 1 tablet (325 mg total) by mouth daily. 30 tablet 0  . carvedilol (COREG) 12.5 MG tablet Take 1.5 tablets (18.75 mg total) by mouth 2 (two) times daily with a meal. 270 tablet 3  . furosemide (LASIX) 20 MG tablet Take 20 mg by mouth daily.    [name] glucose blood (ACCU-CHEK SMARTVIEW) test strip 1 each by Other route See admin instructions. Check blood sugar 3 times daily.    . hydrALAZINE (APRESOLINE) 25 MG tablet Take 25 mg by mouth 3 (three) times daily.    . Hypromellose (ARTIFICIAL TEARS OP) Place 1 drop into both eyes 4 (four) times daily as needed (for dry eyes).    . isosorbide mononitrate (IMDUR) 30 MG 24 hr tablet Take 30 mg by mouth daily.    . metFORMIN (GLUCOPHAGE) 500 MG tablet Take 1 tablet (500 mg total) by mouth 2 (two) times daily with a meal. 180 tablet 3  . Multiple Vitamin (MULTIVITAMIN) tablet Take 1 tablet by mouth daily.    . Omega-3 Fatty Acids (FISH OIL) 1000 MG CAPS Take 1,000 mg by mouth daily.     . pravastatin (PRAVACHOL) 40 MG tablet Take 40 mg by mouth every evening.     No current facility-administered medications for this visit.     Physical Exam: BP 144/55 mmHg  Pulse 55  Resp 16  Ht 5\' 2"  (1.575 m)  Wt 100 lb (45.36 kg)  BMI 18.29 kg/m2  SpO2 97% She looks well Lungs are clear  Diagnostic Tests:  Needle core biopsies demonstrate benign lung with areas of interstitial fibrosis and hyalinization, mild chronic inflammation, and focal pigmented pulmonary macrophages. There are no features of atypia or malignancy present. (CRR:gt, 12/28/13) Mali RUND DO Pathologist, Electronic Signature (Case signed 12/28/2013) Specimen Gross and Clinical  Information Specimen(s) Obtained: Lung, needle/core biopsy(ies), right lower lobe Specimen Clinical Information enlarging right  Impression:  The biopsy findings suggest that this lesion is an inflammatory lesion and not a cancer. There are a couple other small densities in the RLL and one of them has low level hypermetabolic activity so I think this makes the biopsied lesion that was markedly hypermetabolic more likely to be benign. Nevertheless, I think we should continue to follow this closely.  Plan:  I will see her in 3 months with a low dose CT of the chest.

## 2014-02-27 ENCOUNTER — Other Ambulatory Visit: Payer: Self-pay | Admitting: *Deleted

## 2014-02-27 DIAGNOSIS — R911 Solitary pulmonary nodule: Secondary | ICD-10-CM

## 2014-03-05 ENCOUNTER — Telehealth: Payer: Self-pay | Admitting: Internal Medicine

## 2014-03-05 NOTE — Telephone Encounter (Signed)
[employer] auth # [id number] valid 03/06/14-09/02/14 for 6 visits

## 2014-03-05 NOTE — Telephone Encounter (Signed)
Needs [employer] Referrals: Dr. Karsten Ro with [facility] - has appt 1/12 at 12:30

## 2014-04-03 ENCOUNTER — Other Ambulatory Visit: Payer: Self-pay | Admitting: Cardiology

## 2014-04-18 ENCOUNTER — Ambulatory Visit (INDEPENDENT_AMBULATORY_CARE_PROVIDER_SITE_OTHER): Payer: Commercial Managed Care - HMO | Admitting: Surgery

## 2014-04-18 ENCOUNTER — Encounter: Payer: Self-pay | Admitting: Surgery

## 2014-04-18 ENCOUNTER — Other Ambulatory Visit: Payer: Self-pay | Admitting: *Deleted

## 2014-04-18 ENCOUNTER — Ambulatory Visit
Admission: RE | Admit: 2014-04-18 | Discharge: 2014-04-18 | Disposition: A | Discharge status: Home / Self Care | Payer: Commercial Managed Care - HMO | Source: Ambulatory Visit | Attending: Surgery | Admitting: Surgery

## 2014-04-18 VITALS — BP 120/51 | HR 70 | Resp 20 | Ht 62.0 in | Wt 100.0 lb

## 2014-04-18 DIAGNOSIS — R911 Solitary pulmonary nodule: Secondary | ICD-10-CM

## 2014-04-18 DIAGNOSIS — Z951 Presence of aortocoronary bypass graft: Secondary | ICD-10-CM

## 2014-04-19 ENCOUNTER — Encounter: Payer: Self-pay | Admitting: Surgery

## 2014-04-19 NOTE — Progress Notes (Signed)
HPI:  She returns today for follow up of a right lower lobe lung nodule that was hypermetabolic on PET scan on 12/17/8525. She had a CT-guided needle biopsy that showed no sign of atypia or cancer. Her PFT's showed severe COPD so we decided to continue following this lesion. She says she feels fine. She denies cough, sputum production or chest pain.  Current Outpatient Prescriptions  Medication Sig Dispense Refill  . aspirin EC 325 MG EC tablet Take 1 tablet (325 mg total) by mouth daily. 30 tablet 0  . carvedilol (COREG) 12.5 MG tablet Take 1.5 tablets (18.75 mg total) by mouth 2 (two) times daily with a meal. 270 tablet 3  . furosemide (LASIX) 20 MG tablet Take 20 mg by mouth daily.    [name] glucose blood (ACCU-CHEK SMARTVIEW) test strip 1 each by Other route See admin instructions. Check blood sugar 3 times daily.    . hydrALAZINE (APRESOLINE) 25 MG tablet TAKE 1 TABLET THREE TIMES DAILY 270 tablet 3  . Hypromellose (ARTIFICIAL TEARS OP) Place 1 drop into both eyes 4 (four) times daily as needed (for dry eyes).    . isosorbide mononitrate (IMDUR) 30 MG 24 hr tablet TAKE 1 TABLET EVERY DAY 90 tablet 3  . metFORMIN (GLUCOPHAGE) 500 MG tablet Take 1 tablet (500 mg total) by mouth 2 (two) times daily with a meal. 180 tablet 3  . Multiple Vitamin (MULTIVITAMIN) tablet Take 1 tablet by mouth daily.    . Omega-3 Fatty Acids (FISH OIL) 1000 MG CAPS Take 1,000 mg by mouth daily.     . pravastatin (PRAVACHOL) 40 MG tablet TAKE 1 TABLET EVERY EVENING 90 tablet 3   No current facility-administered medications for this visit.     Physical Exam: BP 120/51 mmHg  Pulse 70  Resp 20  Ht 5\' 2"  (1.575 m)  Wt 100 lb (45.36 kg)  BMI 18.29 kg/m2  SpO2 95% She looks well Lungs are clear There is no cervical or supraclavicular adenopathy  Diagnostic Tests:  CLINICAL DATA: Followup pulmonary nodule. History of bladder cancer with surgery and chemotherapy. Weight loss for the past 4  years  EXAM: CT CHEST WITHOUT CONTRAST  TECHNIQUE: Multidetector CT imaging of the chest was performed following the standard protocol without IV contrast.  COMPARISON: 12/11/2013  FINDINGS: Mediastinum: The heart size appears normal. There is no pericardial effusion. Previous median sternotomy and CABG procedure. There is calcified atherosclerotic disease involving the thoracic aorta as well as the native Coronary arteries. No mediastinal or hilar adenopathy.  Lungs/Pleura: No pleural effusion identified. Peripheral interstitial reticulation is identified bilaterally. Changes of paraseptal emphysema are also noted which appear upper lobe predominant. Right lower lobe subpleural mass measures 2.2 x 3 cm, image 103/series 3. This is increased in size from 1.7 x 1.2 cm previously. Also in the right lower lobe is a stable 6 mm nodule, image 89/series 3. Calcified granuloma is identified in the right upper lobe. The sub solid nodule in the right lower lobe is unchanged measuring 2.1 cm, image 75/series 3.  Upper Abdomen: The visualized portions of the liver and spleen are unremarkable. The adrenal glands appear normal.  Musculoskeletal: There is mild multi level spondylosis within the thoracic spine.  IMPRESSION: 1. Increase in size of right lower lobe pulmonary nodule which is worrisome for primary bronchogenic carcinoma. No evidence for hilar or mediastinal adenopathy or distant metastatic disease. 2. Stable 2.1 cm sub solid nodule in the right lower lobe. Continued annual  surveillance CT of this nodule is recommended to establish stability for a period of 3 years. This recommendation follows the consensus statement: Recommendations for the Management of Subsolid Pulmonary Nodules Detected at CT: A Statement from the [place] as published in Radiology 2013; 266:304-317.   Electronically Signed  By: Kerby Moors M.D.  On: 04/18/2014  14:14  Impression:  The RLL pulmonary nodule that was biopsied before has continued to increase in size and remains concerning for lung cancer. There is no hilar or mediastinal adenopathy. The sub-solid nodule in the RLL and the 6 mm nodule in the RLL are unchanged. It is still possible that the enlarging subpleural mass is inflammatory or infectious but I think we should biopsy this again. She has severe COPD by spirometry and a severe reduction in diffusion capacity so I think it is worthwhile trying to be sure that it is cancer before considering removal. It may be amenable to wedge resection.  Plan:  CT guided needle biopsy of the RLL subpleural lung mass. I will see her back afterward.

## 2014-04-23 ENCOUNTER — Other Ambulatory Visit: Payer: Self-pay | Admitting: Radiology

## 2014-04-26 ENCOUNTER — Encounter (HOSPITAL_COMMUNITY): Payer: Self-pay

## 2014-04-26 ENCOUNTER — Ambulatory Visit (HOSPITAL_COMMUNITY)
Admission: RE | Admit: 2014-04-26 | Discharge: 2014-04-26 | Disposition: A | Discharge status: Home / Self Care | Payer: Commercial Managed Care - HMO | Source: Ambulatory Visit | Attending: Surgery | Admitting: Surgery

## 2014-04-26 ENCOUNTER — Ambulatory Visit (HOSPITAL_COMMUNITY)
Admission: RE | Admit: 2014-04-26 | Discharge: 2014-04-26 | Disposition: A | Discharge status: Home / Self Care | Payer: Commercial Managed Care - HMO | Source: Ambulatory Visit | Attending: Diagnostic Radiology | Admitting: Diagnostic Radiology

## 2014-04-26 DIAGNOSIS — F17210 Nicotine dependence, cigarettes, uncomplicated: Secondary | ICD-10-CM | POA: Diagnosis not present

## 2014-04-26 DIAGNOSIS — I255 Ischemic cardiomyopathy: Secondary | ICD-10-CM | POA: Insufficient documentation

## 2014-04-26 DIAGNOSIS — R911 Solitary pulmonary nodule: Secondary | ICD-10-CM | POA: Insufficient documentation

## 2014-04-26 DIAGNOSIS — E119 Type 2 diabetes mellitus without complications: Secondary | ICD-10-CM | POA: Diagnosis not present

## 2014-04-26 DIAGNOSIS — I2510 Atherosclerotic heart disease of native coronary artery without angina pectoris: Secondary | ICD-10-CM | POA: Diagnosis not present

## 2014-04-26 DIAGNOSIS — E785 Hyperlipidemia, unspecified: Secondary | ICD-10-CM | POA: Diagnosis not present

## 2014-04-26 DIAGNOSIS — Z9889 Other specified postprocedural states: Secondary | ICD-10-CM

## 2014-04-26 DIAGNOSIS — Z8551 Personal history of malignant neoplasm of bladder: Secondary | ICD-10-CM | POA: Insufficient documentation

## 2014-04-26 DIAGNOSIS — J449 Chronic obstructive pulmonary disease, unspecified: Secondary | ICD-10-CM | POA: Insufficient documentation

## 2014-04-26 LAB — APTT: aPTT: 30 seconds (ref 24–37)

## 2014-04-26 LAB — CBC
HCT: 38.1 % (ref 36.0–46.0)
Hemoglobin: 12.8 g/dL (ref 12.0–15.0)
MCH: 31.1 pg (ref 26.0–34.0)
MCHC: 33.6 g/dL (ref 30.0–36.0)
MCV: 92.5 fL (ref 78.0–100.0)
Platelets: 309 10*3/uL (ref 150–400)
RBC: 4.12 MIL/uL (ref 3.87–5.11)
RDW: 14.4 % (ref 11.5–15.5)
WBC: 12.7 10*3/uL — AB (ref 4.0–10.5)

## 2014-04-26 LAB — PROTIME-INR
INR: 1.01 (ref 0.00–1.49)
Prothrombin Time: 13.4 seconds (ref 11.6–15.2)

## 2014-04-26 LAB — GLUCOSE, CAPILLARY: GLUCOSE-CAPILLARY: 103 mg/dL — AB (ref 70–99)

## 2014-04-26 MED ORDER — MIDAZOLAM HCL 2 MG/2ML IJ SOLN
INTRAMUSCULAR | Status: AC
  Filled 2014-04-26: qty 4

## 2014-04-26 MED ORDER — SODIUM CHLORIDE 0.9 % IV SOLN
INTRAVENOUS | Status: AC | PRN
  Administered 2014-04-26: 50 mL/h via INTRAVENOUS

## 2014-04-26 MED ORDER — FENTANYL CITRATE 0.05 MG/ML IJ SOLN
INTRAMUSCULAR | Status: AC
  Filled 2014-04-26: qty 4

## 2014-04-26 MED ORDER — HYDRALAZINE HCL 20 MG/ML IJ SOLN
5.0000 mg | Freq: Once | INTRAMUSCULAR | Status: AC
  Administered 2014-04-26: 5 mg via INTRAVENOUS
  Filled 2014-04-26: qty 0.25

## 2014-04-26 MED ORDER — SODIUM CHLORIDE 0.9 % IV SOLN: Freq: Once | INTRAVENOUS | Status: DC

## 2014-04-26 MED ORDER — HYDROCODONE-ACETAMINOPHEN 5-325 MG PO TABS
1.0000 | ORAL_TABLET | ORAL | Status: DC | PRN
  Filled 2014-04-26: qty 2

## 2014-04-26 MED ORDER — FENTANYL CITRATE 0.05 MG/ML IJ SOLN
INTRAMUSCULAR | Status: AC | PRN
  Administered 2014-04-26: 25 ug via INTRAVENOUS

## 2014-04-26 MED ORDER — HYDRALAZINE HCL 20 MG/ML IJ SOLN
INTRAMUSCULAR | Status: AC
  Filled 2014-04-26: qty 1

## 2014-04-26 MED ORDER — MIDAZOLAM HCL 2 MG/2ML IJ SOLN
INTRAMUSCULAR | Status: AC | PRN
  Administered 2014-04-26 (×2): 0.5 mg via INTRAVENOUS

## 2014-04-26 MED ORDER — LIDOCAINE HCL 1 % IJ SOLN
INTRAMUSCULAR | Status: AC
  Filled 2014-04-26: qty 20

## 2014-04-26 NOTE — Discharge Instructions (Signed)
Needle Biopsy of Lung, Care After °Refer to this sheet in the next few weeks. These instructions provide you with information on caring for yourself after your procedure. Your health care provider may also give you more specific instructions. Your treatment has been planned according to current medical practices, but problems sometimes occur. Call your health care provider if you have any problems or questions after your procedure. °WHAT TO EXPECT AFTER THE PROCEDURE °· A bandage will be applied over the area where the needle was inserted. You may be asked to apply pressure to the bandage for several minutes to ensure there is minimal bleeding. °· In most cases, you can leave when your needle biopsy procedure is completed. Do not drive yourself home. Someone else should take you home. °· If you received an IV sedative or general anesthetic, you will be taken to a comfortable place to relax while the medicine wears off. °· If you have upcoming travel scheduled, talk to your health care provider about when it is safe to travel by air after the procedure. °HOME CARE INSTRUCTIONS °· Expect to take it easy for the rest of the day. °· Protect the area where you received the needle biopsy by keeping the bandage in place for as long as instructed. °· You may feel some mild pain or discomfort in the area, but this should stop in a day or two. °· Take medicines only as directed by your health care provider. °SEEK MEDICAL CARE IF:  °· You have pain at the biopsy site that worsens or is not helped by medicine. °· You have swelling or drainage at the needle biopsy site. °· You have a fever. °SEEK IMMEDIATE MEDICAL CARE IF:  °· You have new or worsening shortness of breath. °· You have chest pain. °· You are coughing up blood. °· You have bleeding that does not stop with pressure or a bandage. °· You develop light-headedness or fainting. °Document Released: 12/07/2006 Document Revised: 06/26/2013 Document Reviewed:  07/04/2012 °[employer]® Patient Information ©2015 [employer]. This information is not intended to replace advice given to you by your health care provider. Make sure you discuss any questions you have with your health care provider. ° °

## 2014-04-26 NOTE — Procedures (Signed)
CT guided core biopsies of right lower lobe nodule.   3 cores obtained.  No immediate complication.

## 2014-04-26 NOTE — Sedation Documentation (Signed)
Dr Anselm Pancoast aware of elevated BP, in to see patient. Hydralazine IV ordered

## 2014-04-26 NOTE — H&P (Signed)
Chief Complaint: "I am here for a lung biopsy."  Referring Physician(s): Bartle,Bryan K  History of Present Illness: Tonya Valdez is a 74 y.o. female with recent CT on 04/18/14 revealing enlarging RLL, previously hypermetabolic on PET S/p biopsy with no evidence of malignancy on 12/27/13. The patient has been seen by Dr. Cyndia Bent on 04/19/14 and is scheduled today for repeat image guided RLL biopsy with moderate sedation. She denies any chest pain, shortness of breath or palpitations. She denies any active signs of bleeding or excessive bruising. She denies any recent fever or chills. The patient denies any history of sleep apnea or chronic oxygen use. She has previously tolerated sedation without complications. She does have COPD and still currently smokes tobacco, however has decreased her intake in the last few months.    Past Medical History  Diagnosis Date  . Positive TB test   . Bladder tumor   . COPD (chronic obstructive pulmonary disease)   . Diabetes mellitus TYPE II--  PER PCP NOTE DR Cathlean Cower    PER PT DOES NOT TAKES METFORMIN PRESCRIBED, SHE WATCHES DIET AND EXERCISES  . Cancer     bladder  . H/O hiatal hernia     s/p  74 yrs old  . Chronic systolic CHF (congestive heart failure) 08/04/2012    Echo (08/17/12): EF 30%, diffuse HK, mild MR, mild BAE, PASP.;  Echo (01/2013): EF 40-45%, inferior posterior HK, Gr 2 DD, mild MR, mild LAE  . Ischemic cardiomyopathy   . CAD (coronary artery disease)     a. LHC (10/07/12):  Ostial LM 60%, distal LM 40-50%, oLAD 40-50%, mLAD 70-80%, pCFX subtotally occluded, mCFX 80-90%, oRCA occluded, ostial L subclavian 40%, EF 25-30%, abdominal aorta diffusely diseased with severe ulcerated plaquing (right iliac ostial 95%; left renal long 60-70%, right okay). CO 2.1, CI 1.5 => s/p CABG (L-LAD, S-OM, S-PDA) 10/2012 with Dr. Cyndia Bent   . Bilateral carotid artery disease 09/26/2012    carotid US (8/14):  R 80-99; L 60-79 => s/p R CEA 10/2012 (Dr. Donnetta Hutching)    . Lung nodule     chest CT 09/2012 => needs repeat by 03/2013  . PAD (peripheral artery disease)   . Hyperlipidemia   . Hyperkalemia     ACEI d/c'd 11/2012  . Bladder cancer 04/26/2013    Past Surgical History  Procedure Laterality Date  . Tonsillectomy  1959  . Hiatal hernia repair  1964  (AGE 55)    CHOLECYSTECTOMY AND APPENDECTOMY  . Left elbow surgery    . Lumbar disc surgery  1993  . Abdominal hysterectomy  1981 (APPROX)    BILATERAL SALPINGO-OOPHORECTOMY WITH EXCEPTION A SMALL PIECE OF OVARY REMAINS  . Transurethral resection of bladder tumor N/A 06/24/2012    Procedure: TRANSURETHRAL RESECTION OF BLADDER TUMOR  WITH GYRUS AND INSTILLATION OF MITOMYCIN C  (TURBT);  Surgeon: Claybon Jabs, MD;  Location: [facility];  Service: Urology;  Laterality: N/A;  . Appendectomy    . Wrist surgery Left   . Cholecystectomy    . Tonsillectomy    . Cardiac catheterization    . Hammer toe surgery Left   . Coronary artery bypass graft N/A 11/14/2012    Procedure: CORONARY ARTERY BYPASS GRAFTING (CABG) times three using left internal mammary artery and left saphenous vein;  Surgeon: Gaye Pollack, MD;  Location: [place] OR;  Service: Open Heart Surgery;  Laterality: N/A;  . Intraoperative transesophageal echocardiogram N/A 11/14/2012  Procedure: INTRAOPERATIVE TRANSESOPHAGEAL ECHOCARDIOGRAM;  Surgeon: Gaye Pollack, MD;  Location: [facility] OR;  Service: Open Heart Surgery;  Laterality: N/A;  . Endarterectomy Right 11/14/2012    Procedure: RIGHT ENDARTERECTOMY CAROTID with patch angioplasty.;  Surgeon: Rosetta Posner, MD;  Location: [place];  Service: Vascular;  Laterality: Right;    Allergies: Review of patient's allergies indicates no known allergies.  Medications: Prior to Admission medications   Medication Sig Start Date End Date Taking? Authorizing Provider  aspirin EC 325 MG EC tablet Take 1 tablet (325 mg total) by mouth daily. 11/19/12  Yes Erin Barrett, PA-C  carvedilol (COREG)  12.5 MG tablet Take 1.5 tablets (18.75 mg total) by mouth 2 (two) times daily with a meal. 11/27/13  Yes Lelon Perla, MD  furosemide (LASIX) 20 MG tablet Take 20 mg by mouth daily.   Yes Historical Provider, MD  glucose blood (ACCU-CHEK SMARTVIEW) test strip 1 each by Other route See admin instructions. Check blood sugar 3 times daily.   Yes Historical Provider, MD  hydrALAZINE (APRESOLINE) 25 MG tablet TAKE 1 TABLET THREE TIMES DAILY 04/03/14  Yes Lelon Perla, MD  Hypromellose (ARTIFICIAL TEARS OP) Place 1 drop into both eyes 4 (four) times daily as needed (for dry eyes).   Yes Historical Provider, MD  isosorbide mononitrate (IMDUR) 30 MG 24 hr tablet TAKE 1 TABLET EVERY DAY 04/03/14  Yes Lelon Perla, MD  metFORMIN (GLUCOPHAGE) 500 MG tablet Take 1 tablet (500 mg total) by mouth 2 (two) times daily with a meal. 11/07/13  Yes Biagio Borg, MD  Multiple Vitamin (MULTIVITAMIN) tablet Take 1 tablet by mouth daily.   Yes Historical Provider, MD  Omega-3 Fatty Acids (FISH OIL) 1000 MG CAPS Take 1,000 mg by mouth daily.    Yes Historical Provider, MD  pravastatin (PRAVACHOL) 40 MG tablet TAKE 1 TABLET EVERY EVENING 04/03/14  Yes Lelon Perla, MD     Family History  Problem Relation Age of Onset  . Cancer Other     colon cancer  . Hypertension Other   . Kidney disease Other   . Diabetes Other   . Heart disease Mother     MI at age 17  . Cervical cancer Mother   . Cancer Mother   . Diabetes Mother   . Hypertension Mother   . Lung cancer Father 30    primary site lung CA then colon and bone  . Colon cancer Father   . Cancer Father   . Hypertension Father     History   Social History  . Marital Status: Widowed    Spouse Name: N/A  . Number of Children: 2  . Years of Education: 16   Occupational History  . RN, BSN    Social History Main Topics  . Smoking status: Former Smoker -- 0.20 packs/day for 50 years    Types: Cigarettes    Quit date: 11/13/2012  . Smokeless  tobacco: Never Used     Comment: PT HAS BEEN QUITTING--  SMOKES 4 CIG'S PER DAY,  DOWN FROM 1PPD  . Alcohol Use: No     Comment: occasional wine not often  . Drug Use: No  . Sexual Activity: Not on file   Other Topics Concern  . None   Social History Narrative   Review of Systems: A 12 point ROS discussed and pertinent positives are indicated in the HPI above.  All other systems are negative.  Review of Systems  Vital Signs:  BP 193/74 mmHg  Pulse 60  Temp(Src) 98.2 F (36.8 C)  Resp 18  Ht 5\' 2"  (1.575 m)  Wt 96 lb (43.545 kg)  BMI 17.55 kg/m2  SpO2 100%  Physical Exam  Constitutional: She is oriented to person, place, and time. No distress.  HENT:  Head: Normocephalic and atraumatic.  Neck: No tracheal deviation present.  Cardiovascular: Normal rate and regular rhythm.  Exam reveals no gallop and no friction rub.   No murmur heard. Pulmonary/Chest: Effort normal and breath sounds normal. No respiratory distress. She has no wheezes. She has no rales.  Abdominal: Soft. Bowel sounds are normal. She exhibits no distension. There is no tenderness.  Neurological: She is alert and oriented to person, place, and time.  Skin: Skin is warm and dry. She is not diaphoretic.  Psychiatric: She has a normal mood and affect. Her behavior is normal. Thought content normal.    Mallampati Score:  MD Evaluation Airway: WNL Heart: WNL Abdomen: WNL Chest/ Lungs: WNL ASA  Classification: 3 Mallampati/Airway Score: Two  Imaging: Ct Chest Nodule Follow Up Low Dose W/o  04/18/2014   CLINICAL DATA:  Followup pulmonary nodule. History of bladder cancer with surgery and chemotherapy. Weight loss for the past 4 years  EXAM: CT CHEST WITHOUT CONTRAST  TECHNIQUE: Multidetector CT imaging of the chest was performed following the standard protocol without IV contrast.  COMPARISON:  12/11/2013  FINDINGS: Mediastinum: The heart size appears normal. There is no pericardial effusion. Previous median  sternotomy and CABG procedure. There is calcified atherosclerotic disease involving the thoracic aorta as well as the native Coronary arteries. No mediastinal or hilar adenopathy.  Lungs/Pleura: No pleural effusion identified. Peripheral interstitial reticulation is identified bilaterally. Changes of paraseptal emphysema are also noted which appear upper lobe predominant. Right lower lobe subpleural mass measures 2.2 x 3 cm, image 103/series 3. This is increased in size from 1.7 x 1.2 cm previously. Also in the right lower lobe is a stable 6 mm nodule, image 89/series 3. Calcified granuloma is identified in the right upper lobe. The sub solid nodule in the right lower lobe is unchanged measuring 2.1 cm, image 75/series 3.  Upper Abdomen: The visualized portions of the liver and spleen are unremarkable. The adrenal glands appear normal.  Musculoskeletal: There is mild multi level spondylosis within the thoracic spine.  IMPRESSION: 1. Increase in size of right lower lobe pulmonary nodule which is worrisome for primary bronchogenic carcinoma. No evidence for hilar or mediastinal adenopathy or distant metastatic disease. 2. Stable 2.1 cm sub solid nodule in the right lower lobe. Continued annual surveillance CT of this nodule is recommended to establish stability for a period of 3 years. This recommendation follows the consensus statement: Recommendations for the Management of Subsolid Pulmonary Nodules Detected at CT: A Statement from the [place] as published in Radiology 2013; 266:304-317.   Electronically Signed   By: Kerby Moors M.D.   On: 04/18/2014 14:14    Labs:  CBC:  Recent Labs  12/27/13 0941  WBC 9.8  HGB 10.7*  HCT 30.9*  PLT 237    COAGS:  Recent Labs  12/27/13 0941  INR 1.08  APTT 31    BMP:  Recent Labs  11/01/13 1550 11/27/13 0950  NA 136 139  K 3.5 4.2  CL 99 103  CO2 28 29  GLUCOSE 98 138*  BUN 10 15  CALCIUM 9.4 9.2  CREATININE 0.8 0.77  GFRNONAA   --  76  GFRAA  --  88    LIVER FUNCTION TESTS:  Recent Labs  11/01/13 1550  BILITOT 0.5  AST 28  ALT 16  ALKPHOS 63  PROT 7.2  ALBUMIN 3.8   Assessment and Plan: Enlarging CT 8/81/10 RLL hypermetabolic on PET S/p biopsy with no evidence of malignancy 12/27/13 Seen by Dr. Cyndia Bent 04/19/14 Scheduled today for repeat image guided RLL biopsy with moderate sedation The patient has been NPO, no blood thinners taken, labs and vitals have been reviewed. Risks and Benefits discussed with the patient including, but not limited to bleeding, hemoptysis, respiratory failure requiring intubation, infection, pneumothorax requiring chest tube placement, stroke from air embolism or even death. All of the patient's questions were answered, patient is agreeable to proceed. Consent signed and in chart. CAD s/p CABG, EF 30% COPD, active tobacco user DM type II History of bladder cancer   Thank you for this interesting consult.  I greatly enjoyed meeting Tonya Valdez and look forward to participating in their care.  SignedHedy Jacob 04/26/2014, 10:21 AM   I spent a total of 20 Minutes in face to face in clinical consultation, greater than 50% of which was counseling/coordinating care for enlarging hypermetabolic RLL.

## 2014-05-02 ENCOUNTER — Telehealth: Payer: Self-pay | Admitting: Cardiology

## 2014-05-02 ENCOUNTER — Ambulatory Visit (INDEPENDENT_AMBULATORY_CARE_PROVIDER_SITE_OTHER): Payer: Commercial Managed Care - HMO | Admitting: Internal Medicine

## 2014-05-02 ENCOUNTER — Ambulatory Visit: Payer: Commercial Managed Care - HMO | Admitting: Surgery

## 2014-05-02 ENCOUNTER — Encounter: Payer: Self-pay | Admitting: Internal Medicine

## 2014-05-02 ENCOUNTER — Telehealth: Payer: Self-pay | Admitting: *Deleted

## 2014-05-02 ENCOUNTER — Ambulatory Visit (INDEPENDENT_AMBULATORY_CARE_PROVIDER_SITE_OTHER): Payer: Commercial Managed Care - HMO | Admitting: Surgery

## 2014-05-02 ENCOUNTER — Encounter: Payer: Self-pay | Admitting: Surgery

## 2014-05-02 ENCOUNTER — Other Ambulatory Visit (INDEPENDENT_AMBULATORY_CARE_PROVIDER_SITE_OTHER): Payer: Commercial Managed Care - HMO

## 2014-05-02 VITALS — BP 110/62 | HR 72 | Temp 98.4°F | Resp 18 | Ht 62.0 in | Wt 100.1 lb

## 2014-05-02 DIAGNOSIS — E119 Type 2 diabetes mellitus without complications: Secondary | ICD-10-CM

## 2014-05-02 DIAGNOSIS — Z0000 Encounter for general adult medical examination without abnormal findings: Secondary | ICD-10-CM

## 2014-05-02 DIAGNOSIS — C3431 Malignant neoplasm of lower lobe, right bronchus or lung: Secondary | ICD-10-CM

## 2014-05-02 LAB — HEPATIC FUNCTION PANEL
ALBUMIN: 3.6 g/dL (ref 3.5–5.2)
ALK PHOS: 85 U/L (ref 39–117)
ALT: 12 U/L (ref 0–35)
AST: 19 U/L (ref 0–37)
Bilirubin, Direct: 0.1 mg/dL (ref 0.0–0.3)
Total Bilirubin: 0.3 mg/dL (ref 0.2–1.2)
Total Protein: 7 g/dL (ref 6.0–8.3)

## 2014-05-02 LAB — LIPID PANEL
Cholesterol: 113 mg/dL (ref 0–200)
HDL: 49.9 mg/dL (ref >39.00)
LDL Cholesterol: 42 mg/dL (ref 0–99)
NonHDL: 63.1
TRIGLYCERIDES: 104 mg/dL (ref 0.0–149.0)
Total CHOL/HDL Ratio: 2
VLDL: 20.8 mg/dL (ref 0.0–40.0)

## 2014-05-02 LAB — BASIC METABOLIC PANEL
BUN: 19 mg/dL (ref 6–23)
CO2: 32 mEq/L (ref 19–32)
Calcium: 9.4 mg/dL (ref 8.4–10.5)
Chloride: 103 mEq/L (ref 96–112)
Creatinine, Ser: 0.76 mg/dL (ref 0.40–1.20)
GFR: 78.94 mL/min (ref >60.00)
Glucose, Bld: 179 mg/dL — ABNORMAL HIGH (ref 70–99)
POTASSIUM: 4.2 meq/L (ref 3.5–5.1)
Sodium: 138 mEq/L (ref 135–145)

## 2014-05-02 LAB — HEMOGLOBIN A1C: Hgb A1c MFr Bld: 7.4 % — ABNORMAL HIGH (ref 4.6–6.5)

## 2014-05-02 LAB — URINALYSIS, ROUTINE W REFLEX MICROSCOPIC
Bilirubin Urine: NEGATIVE
Hgb urine dipstick: NEGATIVE
KETONES UR: NEGATIVE
LEUKOCYTES UA: NEGATIVE
Nitrite: NEGATIVE
RBC / HPF: NONE SEEN (ref 0–?)
Specific Gravity, Urine: 1.025 (ref 1.000–1.030)
Total Protein, Urine: 30 — AB
UROBILINOGEN UA: 0.2 (ref 0.0–1.0)
Urine Glucose: NEGATIVE
pH: 5.5 (ref 5.0–8.0)

## 2014-05-02 LAB — TSH: TSH: 3.45 u[IU]/mL (ref 0.35–4.50)

## 2014-05-02 LAB — MICROALBUMIN / CREATININE URINE RATIO
Creatinine,U: 70.3 mg/dL
MICROALB UR: 30.4 mg/dL — AB (ref 0.0–1.9)
Microalb Creat Ratio: 43.2 mg/g — ABNORMAL HIGH (ref 0.0–30.0)

## 2014-05-02 NOTE — Progress Notes (Signed)
     HPI:  The patient returns today to discuss the results of her recent CT-guided needle biopsy of the posterior RLL lung mass. 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 There is also a sub-solid lesion more superiorly in the RLL that had low level hypermetabolic activity on PET scan in October 2015 that was indeterminate and this lesion is unchanged on the most recent CT scan of 04/18/2014. Her PFT's from 12/18/2013 showed moderate COPD with an FEV1 of 1.32 and a severely reduced diffusion capacity of 32%. These were unchanged from her prior PFT's of 11/10/2014.  Current Outpatient Prescriptions  Medication Sig Dispense Refill  . aspirin EC 325 MG EC tablet Take 1 tablet (325 mg total) by mouth daily. 30 tablet 0  . carvedilol (COREG) 12.5 MG tablet Take 1.5 tablets (18.75 mg total) by mouth 2 (two) times daily with a meal. 270 tablet 3  . furosemide (LASIX) 20 MG tablet Take 20 mg by mouth daily.    [name] glucose blood (ACCU-CHEK SMARTVIEW) test strip 1 each by Other route See admin instructions. Check blood sugar 4 times daily.    . hydrALAZINE (APRESOLINE) 25 MG tablet TAKE 1 TABLET THREE TIMES DAILY 270 tablet 3  . Hypromellose (ARTIFICIAL TEARS OP) Place 1 drop into both eyes 4 (four) times daily as needed (for dry eyes).    . isosorbide mononitrate (IMDUR) 30 MG 24 hr tablet TAKE 1 TABLET EVERY DAY 90 tablet 3  . metFORMIN (GLUCOPHAGE) 500 MG tablet Take 1 tablet (500 mg total) by mouth 2 (two) times daily with a meal. 180 tablet 3  . Multiple Vitamin (MULTIVITAMIN) tablet Take 1 tablet by mouth daily.    . Omega-3 Fatty Acids (FISH OIL) 1000 MG CAPS Take 1,000 mg by mouth daily.     . pravastatin (PRAVACHOL) 40 MG tablet TAKE 1 TABLET EVERY EVENING 90 tablet 3   No current facility-administered medications for this visit.     Physical Exam: BP 128/56 mmHg  Pulse 60  Resp 16  Ht 5\' 2"  (1.575 m)  Wt 97 lb (43.999 kg)  BMI 17.74 kg/m2  SpO2 96%   She is a  thin, frail-appearing woman but looks the same as she has for the past couple years since I have known her. 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 She is 43 with a history of coronary disease and cerebrovascular disease s/p CABG and right CEA, significant COPD and a long smoking history, and is not a good operative candidate. I think the subpleural carcinoma may be amenable to a wedge resection but I don't think she is a candidate for a lower lobectomy. She also has a second indeterminate lesion in the RLL that could be a slowly growing carcinoma that could not be resected without lobectomy. It may be best to consider radiation therapy in this patient.   Plan:  I will discuss her at [place] and have her return to [facility] clinic to discuss XRT if radiation oncology feels that it is a reasonable option.   Gaye Pollack, MD [facility] [phone]

## 2014-05-02 NOTE — Assessment & Plan Note (Signed)
stable overall by history and exam, recent data reviewed with pt, and pt to continue medical treatment as before,  to f/u any worsening symptoms or concerns Lab Results  Component Value Date   HGBA1C 6.8* 11/01/2013

## 2014-05-02 NOTE — Assessment & Plan Note (Signed)

## 2014-05-02 NOTE — Progress Notes (Signed)
Pre visit review using our clinic review tool, if applicable. No additional management support is needed unless otherwise documented below in the visit note. 

## 2014-05-02 NOTE — Telephone Encounter (Signed)
Received referral from [facility].  Called patient to schedule, left vm message to call.

## 2014-05-02 NOTE — Progress Notes (Signed)
Subjective:    Patient ID: Tonya Valdez, female    DOB: 11/28/1940, 74 y.o.   MRN: [id number]  HPI  Here for wellness and f/u;  Overall doing ok;  Pt denies Chest pain, worsening SOB, DOE, wheezing, orthopnea, PND, worsening LE edema, palpitations, dizziness or syncope.  Pt denies neurological change such as new headache, facial or extremity weakness.  Pt denies polydipsia, polyuria, or low sugar symptoms. Pt states overall good compliance with treatment and medications, good tolerability, and has been trying to follow appropriate diet.  Pt denies worsening depressive symptoms, suicidal ideation or panic. No fever, night sweats, wt loss, loss of appetite, or other constitutional symptoms.  Pt states good ability with ADL's, has low fall risk, home safety reviewed and adequate, no other significant changes in hearing or vision, and only occasionally active with exercise. New dx RLL lung ca, for XRT soon. Has f/u with Dr Donnetta Hutching next mo for left carotid stenosis.  CBG's running very low 100's. Past Medical History  Diagnosis Date  . Positive TB test   . Bladder tumor   . COPD (chronic obstructive pulmonary disease)   . Diabetes mellitus TYPE II--  PER PCP NOTE DR Cathlean Cower    PER PT DOES NOT TAKES METFORMIN PRESCRIBED, SHE WATCHES DIET AND EXERCISES  . Cancer     bladder  . H/O hiatal hernia     s/p  74 yrs old  . Chronic systolic CHF (congestive heart failure) 08/04/2012    Echo (08/17/12): EF 30%, diffuse HK, mild MR, mild BAE, PASP.;  Echo (01/2013): EF 40-45%, inferior posterior HK, Gr 2 DD, mild MR, mild LAE  . Ischemic cardiomyopathy   . CAD (coronary artery disease)     a. LHC (10/07/12):  Ostial LM 60%, distal LM 40-50%, oLAD 40-50%, mLAD 70-80%, pCFX subtotally occluded, mCFX 80-90%, oRCA occluded, ostial L subclavian 40%, EF 25-30%, abdominal aorta diffusely diseased with severe ulcerated plaquing (right iliac ostial 95%; left renal long 60-70%, right okay). CO 2.1, CI 1.5 => s/p CABG  (L-LAD, S-OM, S-PDA) 10/2012 with Dr. Cyndia Bent   . Bilateral carotid artery disease 09/26/2012    carotid US (8/14):  R 80-99; L 60-79 => s/p R CEA 10/2012 (Dr. Donnetta Hutching)  . Lung nodule     chest CT 09/2012 => needs repeat by 03/2013  . PAD (peripheral artery disease)   . Hyperlipidemia   . Hyperkalemia     ACEI d/c'd 11/2012  . Bladder cancer 04/26/2013   Past Surgical History  Procedure Laterality Date  . Tonsillectomy  1959  . Hiatal hernia repair  1964  (AGE 14)    CHOLECYSTECTOMY AND APPENDECTOMY  . Left elbow surgery    . Lumbar disc surgery  1993  . Abdominal hysterectomy  1981 (APPROX)    BILATERAL SALPINGO-OOPHORECTOMY WITH EXCEPTION A SMALL PIECE OF OVARY REMAINS  . Transurethral resection of bladder tumor N/A 06/24/2012    Procedure: TRANSURETHRAL RESECTION OF BLADDER TUMOR  WITH GYRUS AND INSTILLATION OF MITOMYCIN C  (TURBT);  Surgeon: Claybon Jabs, MD;  Location: [facility];  Service: Urology;  Laterality: N/A;  . Appendectomy    . Wrist surgery Left   . Cholecystectomy    . Tonsillectomy    . Cardiac catheterization    . Hammer toe surgery Left   . Coronary artery bypass graft N/A 11/14/2012    Procedure: CORONARY ARTERY BYPASS GRAFTING (CABG) times three using left internal mammary artery and left saphenous vein;  Surgeon: Gaye Pollack, MD;  Location: [place];  Service: Open Heart Surgery;  Laterality: N/A;  . Intraoperative transesophageal echocardiogram N/A 11/14/2012    Procedure: INTRAOPERATIVE TRANSESOPHAGEAL ECHOCARDIOGRAM;  Surgeon: Gaye Pollack, MD;  Location: [facility] OR;  Service: Open Heart Surgery;  Laterality: N/A;  . Endarterectomy Right 11/14/2012    Procedure: RIGHT ENDARTERECTOMY CAROTID with patch angioplasty.;  Surgeon: Rosetta Posner, MD;  Location: [place];  Service: Vascular;  Laterality: Right;    reports that she quit smoking about 17 months ago. Her smoking use included Cigarettes. She has a 10 pack-year smoking history. She has never used smokeless  tobacco. She reports that she does not drink alcohol or use illicit drugs. family history includes Cancer in her father, mother, and other; Cervical cancer in her mother; Colon cancer in her father; Diabetes in her mother and other; Heart disease in her mother; Hypertension in her father, mother, and other; Kidney disease in her other; Lung cancer (age of onset: 35) in her father. No Known Allergies Current Outpatient Prescriptions on File Prior to Visit  Medication Sig Dispense Refill  . aspirin EC 325 MG EC tablet Take 1 tablet (325 mg total) by mouth daily. 30 tablet 0  . carvedilol (COREG) 12.5 MG tablet Take 1.5 tablets (18.75 mg total) by mouth 2 (two) times daily with a meal. 270 tablet 3  . furosemide (LASIX) 20 MG tablet Take 20 mg by mouth daily.    [name] glucose blood (ACCU-CHEK SMARTVIEW) test strip 1 each by Other route See admin instructions. Check blood sugar 4 times daily.    . hydrALAZINE (APRESOLINE) 25 MG tablet TAKE 1 TABLET THREE TIMES DAILY 270 tablet 3  . Hypromellose (ARTIFICIAL TEARS OP) Place 1 drop into both eyes 4 (four) times daily as needed (for dry eyes).    . isosorbide mononitrate (IMDUR) 30 MG 24 hr tablet TAKE 1 TABLET EVERY DAY 90 tablet 3  . metFORMIN (GLUCOPHAGE) 500 MG tablet Take 1 tablet (500 mg total) by mouth 2 (two) times daily with a meal. 180 tablet 3  . Multiple Vitamin (MULTIVITAMIN) tablet Take 1 tablet by mouth daily.    . Omega-3 Fatty Acids (FISH OIL) 1000 MG CAPS Take 1,000 mg by mouth daily.     . pravastatin (PRAVACHOL) 40 MG tablet TAKE 1 TABLET EVERY EVENING 90 tablet 3   No current facility-administered medications on file prior to visit.    Review of Systems Constitutional: Negative for increased diaphoresis, other activity, appetite or siginficant weight change other than noted HENT: Negative for worsening hearing loss, ear pain, facial swelling, mouth sores and neck stiffness.   Eyes: Negative for other worsening pain, redness or visual  disturbance.  Respiratory: Negative for shortness of breath and wheezing  Cardiovascular: Negative for chest pain and palpitations.  Gastrointestinal: Negative for diarrhea, blood in stool, abdominal distention or other pain Genitourinary: Negative for hematuria, flank pain or change in urine volume.  Musculoskeletal: Negative for myalgias or other joint complaints.  Skin: Negative for color change and wound or drainage.  Neurological: Negative for syncope and numbness. other than noted Hematological: Negative for adenopathy. or other swelling Psychiatric/Behavioral: Negative for hallucinations, SI, self-injury, decreased concentration or other worsening agitation.      Objective:   Physical Exam BP 110/62 mmHg  Pulse 72  Temp(Src) 98.4 F (36.9 C) (Oral)  Resp 18  Ht 5\' 2"  (1.575 m)  Wt 100 lb 1.9 oz (45.414 kg)  BMI 18.31 kg/m2  SpO2  96% VS noted,  Constitutional: Pt is oriented to person, place, and time. Appears well-developed and well-nourished, in no significant distress Head: Normocephalic and atraumatic.  Right Ear: External ear normal.  Left Ear: External ear normal.  Nose: Nose normal.  Mouth/Throat: Oropharynx is clear and moist.  Eyes: Conjunctivae and EOM are normal. Pupils are equal, round, and reactive to light.  Neck: Normal range of motion. Neck supple. No JVD present. No tracheal deviation present or significant neck LA or mass Cardiovascular: Normal rate, regular rhythm, normal heart sounds and intact distal pulses.   Pulmonary/Chest: Effort normal and breath sounds without rales or wheezing  Abdominal: Soft. Bowel sounds are normal. NT. No HSM  Musculoskeletal: Normal range of motion. Exhibits no edema.  Lymphadenopathy:  Has no cervical adenopathy.  Neurological: Pt is alert and oriented to person, place, and time. Pt has normal reflexes. No cranial nerve deficit. Motor grossly intact Skin: Skin is warm and dry. No rash noted.  Psychiatric:  Has normal mood  and affect. Behavior is normal.      Assessment & Plan:

## 2014-05-02 NOTE — Patient Instructions (Addendum)

## 2014-05-03 ENCOUNTER — Telehealth: Payer: Self-pay | Admitting: Cardiology

## 2014-05-03 NOTE — Telephone Encounter (Signed)
Closed encounter °

## 2014-05-04 ENCOUNTER — Telehealth: Payer: Self-pay | Admitting: *Deleted

## 2014-05-04 NOTE — Telephone Encounter (Signed)
Called patient to set up appt for thoracic clinic on 05/10/14.  Left vm message to call with my name and phone number

## 2014-05-04 NOTE — Telephone Encounter (Signed)
Called patient to set up for thoracic clinic on 05/10/14, unable to reach.  I left a vm message to call with my name and phone number

## 2014-05-04 NOTE — Telephone Encounter (Signed)
Closed encounter °

## 2014-05-06 ENCOUNTER — Encounter (HOSPITAL_COMMUNITY): Payer: Self-pay | Admitting: Emergency Medicine

## 2014-05-06 ENCOUNTER — Inpatient Hospital Stay (HOSPITAL_COMMUNITY)
Admission: EM | Admit: 2014-05-06 | Discharge: 2014-05-08 | DRG: 311 | Disposition: A | Discharge status: Home / Self Care | Payer: Commercial Managed Care - HMO | Attending: Internal Medicine | Admitting: Internal Medicine

## 2014-05-06 ENCOUNTER — Emergency Department (HOSPITAL_COMMUNITY): Payer: Commercial Managed Care - HMO

## 2014-05-06 DIAGNOSIS — E119 Type 2 diabetes mellitus without complications: Secondary | ICD-10-CM | POA: Diagnosis present

## 2014-05-06 DIAGNOSIS — I249 Acute ischemic heart disease, unspecified: Principal | ICD-10-CM | POA: Diagnosis present

## 2014-05-06 DIAGNOSIS — I739 Peripheral vascular disease, unspecified: Secondary | ICD-10-CM | POA: Diagnosis present

## 2014-05-06 DIAGNOSIS — Z951 Presence of aortocoronary bypass graft: Secondary | ICD-10-CM

## 2014-05-06 DIAGNOSIS — R079 Chest pain, unspecified: Secondary | ICD-10-CM | POA: Diagnosis present

## 2014-05-06 DIAGNOSIS — Z90710 Acquired absence of both cervix and uterus: Secondary | ICD-10-CM

## 2014-05-06 DIAGNOSIS — I10 Essential (primary) hypertension: Secondary | ICD-10-CM | POA: Diagnosis present

## 2014-05-06 DIAGNOSIS — C3492 Malignant neoplasm of unspecified part of left bronchus or lung: Secondary | ICD-10-CM | POA: Diagnosis present

## 2014-05-06 DIAGNOSIS — J449 Chronic obstructive pulmonary disease, unspecified: Secondary | ICD-10-CM | POA: Diagnosis present

## 2014-05-06 DIAGNOSIS — C3431 Malignant neoplasm of lower lobe, right bronchus or lung: Secondary | ICD-10-CM | POA: Diagnosis present

## 2014-05-06 DIAGNOSIS — C679 Malignant neoplasm of bladder, unspecified: Secondary | ICD-10-CM | POA: Diagnosis present

## 2014-05-06 DIAGNOSIS — I2510 Atherosclerotic heart disease of native coronary artery without angina pectoris: Secondary | ICD-10-CM | POA: Diagnosis present

## 2014-05-06 DIAGNOSIS — Z79899 Other long term (current) drug therapy: Secondary | ICD-10-CM

## 2014-05-06 DIAGNOSIS — I5022 Chronic systolic (congestive) heart failure: Secondary | ICD-10-CM | POA: Diagnosis present

## 2014-05-06 DIAGNOSIS — Z7982 Long term (current) use of aspirin: Secondary | ICD-10-CM

## 2014-05-06 DIAGNOSIS — E785 Hyperlipidemia, unspecified: Secondary | ICD-10-CM | POA: Diagnosis present

## 2014-05-06 DIAGNOSIS — Z87891 Personal history of nicotine dependence: Secondary | ICD-10-CM

## 2014-05-06 DIAGNOSIS — R0602 Shortness of breath: Secondary | ICD-10-CM | POA: Diagnosis present

## 2014-05-06 DIAGNOSIS — Z8551 Personal history of malignant neoplasm of bladder: Secondary | ICD-10-CM

## 2014-05-06 LAB — CBC
HCT: 32 % — ABNORMAL LOW (ref 36.0–46.0)
Hemoglobin: 10.8 g/dL — ABNORMAL LOW (ref 12.0–15.0)
MCH: 30.2 pg (ref 26.0–34.0)
MCHC: 33.8 g/dL (ref 30.0–36.0)
MCV: 89.4 fL (ref 78.0–100.0)
Platelets: 255 10*3/uL (ref 150–400)
RBC: 3.58 MIL/uL — ABNORMAL LOW (ref 3.87–5.11)
RDW: 13.7 % (ref 11.5–15.5)
WBC: 16.2 10*3/uL — ABNORMAL HIGH (ref 4.0–10.5)

## 2014-05-06 LAB — I-STAT TROPONIN, ED: Troponin i, poc: 0.04 ng/mL (ref 0.00–0.08)

## 2014-05-06 LAB — COMPREHENSIVE METABOLIC PANEL
ALBUMIN: 3.2 g/dL — AB (ref 3.5–5.2)
ALT: 27 U/L (ref 0–35)
AST: 41 U/L — AB (ref 0–37)
Alkaline Phosphatase: 87 U/L (ref 39–117)
Anion gap: 11 (ref 5–15)
BUN: 16 mg/dL (ref 6–23)
CO2: 25 mmol/L (ref 19–32)
Calcium: 9 mg/dL (ref 8.4–10.5)
Chloride: 101 mmol/L (ref 96–112)
Creatinine, Ser: 0.73 mg/dL (ref 0.50–1.10)
GFR calc non Af Amer: 82 mL/min — ABNORMAL LOW (ref >90)
Glucose, Bld: 211 mg/dL — ABNORMAL HIGH (ref 70–99)
POTASSIUM: 3.4 mmol/L — AB (ref 3.5–5.1)
SODIUM: 137 mmol/L (ref 135–145)
Total Bilirubin: 0.5 mg/dL (ref 0.3–1.2)
Total Protein: 6.7 g/dL (ref 6.0–8.3)

## 2014-05-06 LAB — BRAIN NATRIURETIC PEPTIDE: B Natriuretic Peptide: 829.1 pg/mL — ABNORMAL HIGH (ref 0.0–100.0)

## 2014-05-06 LAB — D-DIMER, QUANTITATIVE (NOT AT ARMC): D DIMER QUANT: 3.35 ug{FEU}/mL — AB (ref 0.00–0.48)

## 2014-05-06 MED ORDER — IPRATROPIUM BROMIDE 0.02 % IN SOLN
0.5000 mg | Freq: Once | RESPIRATORY_TRACT | Status: AC
  Administered 2014-05-06: 0.5 mg via RESPIRATORY_TRACT
  Filled 2014-05-06: qty 2.5

## 2014-05-06 MED ORDER — ALBUTEROL SULFATE (2.5 MG/3ML) 0.083% IN NEBU
5.0000 mg | INHALATION_SOLUTION | Freq: Once | RESPIRATORY_TRACT | Status: AC
  Administered 2014-05-06: 5 mg via RESPIRATORY_TRACT
  Filled 2014-05-06: qty 6

## 2014-05-06 MED ORDER — IOHEXOL 350 MG/ML SOLN
80.0000 mL | Freq: Once | INTRAVENOUS | Status: AC | PRN
  Administered 2014-05-06: 80 mL via INTRAVENOUS

## 2014-05-06 MED ORDER — ALBUTEROL SULFATE (2.5 MG/3ML) 0.083% IN NEBU
5.0000 mg | INHALATION_SOLUTION | Freq: Once | RESPIRATORY_TRACT | Status: AC
  Administered 2014-05-07: 5 mg via RESPIRATORY_TRACT
  Filled 2014-05-06: qty 6

## 2014-05-06 MED ORDER — PREDNISONE 20 MG PO TABS
60.0000 mg | ORAL_TABLET | Freq: Once | ORAL | Status: AC
  Administered 2014-05-07: 60 mg via ORAL
  Filled 2014-05-06: qty 3

## 2014-05-06 NOTE — ED Provider Notes (Signed)
Pt here for abrupt onset of SOB She appears tachypneic Concern for PE, but CT chest negative Given h/o CAD  advised admission to hospital Pt stable in the ED  Ripley Fraise, MD 05/06/14 2344

## 2014-05-06 NOTE — ED Notes (Signed)
Patient transported to CT 

## 2014-05-06 NOTE — ED Notes (Signed)
Pt to ED via [facility]- pt admits to intermittent shortness of breath since Friday- states tonight she began experiencing chest pressure with shortness of breath.  Pt recently diagnosed with lung cancer but has not started treatment yet.  EMS gave 324 ASA and 3 Nitro- pt had relief with Nitro.  Pt speaking in full sentences- 2L oxygen given for comfort.

## 2014-05-06 NOTE — ED Provider Notes (Signed)
CSN: [id number]     Arrival date & time 05/06/14  2027 History   First MD Initiated Contact with Patient 05/06/14 2055     Chief Complaint  Patient presents with  . Shortness of Breath     (Consider location/radiation/quality/duration/timing/severity/associated sxs/prior Treatment) Patient is a 74 y.o. female presenting with shortness of breath. The history is provided by the patient.  Shortness of Breath Severity:  Moderate Onset quality:  Sudden Duration:  2 hours Timing:  Intermittent Progression:  Partially resolved Chronicity:  New Relieved by:  Nothing Worsened by:  Nothing tried Ineffective treatments:  None tried Associated symptoms: chest pain   Associated symptoms: no abdominal pain, no cough, no fever, no rash and no vomiting   Chest pain:    Quality:  Dull   Severity:  Moderate   Duration:  2 hours   Timing:  Intermittent   Progression:  Waxing and waning   Chronicity:  Recurrent Risk factors: hx of cancer     Past Medical History  Diagnosis Date  . Positive TB test   . Bladder tumor   . COPD (chronic obstructive pulmonary disease)   . Diabetes mellitus TYPE II--  PER PCP NOTE DR Cathlean Cower    PER PT DOES NOT TAKES METFORMIN PRESCRIBED, SHE WATCHES DIET AND EXERCISES  . Cancer     bladder  . H/O hiatal hernia     s/p  74 yrs old  . Chronic systolic CHF (congestive heart failure) 08/04/2012    Echo (08/17/12): EF 30%, diffuse HK, mild MR, mild BAE, PASP.;  Echo (01/2013): EF 40-45%, inferior posterior HK, Gr 2 DD, mild MR, mild LAE  . Ischemic cardiomyopathy   . CAD (coronary artery disease)     a. LHC (10/07/12):  Ostial LM 60%, distal LM 40-50%, oLAD 40-50%, mLAD 70-80%, pCFX subtotally occluded, mCFX 80-90%, oRCA occluded, ostial L subclavian 40%, EF 25-30%, abdominal aorta diffusely diseased with severe ulcerated plaquing (right iliac ostial 95%; left renal long 60-70%, right okay). CO 2.1, CI 1.5 => s/p CABG (L-LAD, S-OM, S-PDA) 10/2012 with Dr. Cyndia Bent   .  Bilateral carotid artery disease 09/26/2012    carotid US (8/14):  R 80-99; L 60-79 => s/p R CEA 10/2012 (Dr. Donnetta Hutching)  . Lung nodule     chest CT 09/2012 => needs repeat by 03/2013  . PAD (peripheral artery disease)   . Hyperlipidemia   . Hyperkalemia     ACEI d/c'd 11/2012  . Bladder cancer 04/26/2013   Past Surgical History  Procedure Laterality Date  . Tonsillectomy  1959  . Hiatal hernia repair  1964  (AGE 54)    CHOLECYSTECTOMY AND APPENDECTOMY  . Left elbow surgery    . Lumbar disc surgery  1993  . Abdominal hysterectomy  1981 (APPROX)    BILATERAL SALPINGO-OOPHORECTOMY WITH EXCEPTION A SMALL PIECE OF OVARY REMAINS  . Transurethral resection of bladder tumor N/A 06/24/2012    Procedure: TRANSURETHRAL RESECTION OF BLADDER TUMOR  WITH GYRUS AND INSTILLATION OF MITOMYCIN C  (TURBT);  Surgeon: Claybon Jabs, MD;  Location: [facility];  Service: Urology;  Laterality: N/A;  . Appendectomy    . Wrist surgery Left   . Cholecystectomy    . Tonsillectomy    . Cardiac catheterization    . Hammer toe surgery Left   . Coronary artery bypass graft N/A 11/14/2012    Procedure: CORONARY ARTERY BYPASS GRAFTING (CABG) times three using left internal mammary artery and left saphenous vein;  Surgeon: Gaye Pollack, MD;  Location: [place];  Service: Open Heart Surgery;  Laterality: N/A;  . Intraoperative transesophageal echocardiogram N/A 11/14/2012    Procedure: INTRAOPERATIVE TRANSESOPHAGEAL ECHOCARDIOGRAM;  Surgeon: Gaye Pollack, MD;  Location: [facility] OR;  Service: Open Heart Surgery;  Laterality: N/A;  . Endarterectomy Right 11/14/2012    Procedure: RIGHT ENDARTERECTOMY CAROTID with patch angioplasty.;  Surgeon: Rosetta Posner, MD;  Location: [facility] OR;  Service: Vascular;  Laterality: Right;   Family History  Problem Relation Age of Onset  . Cancer Other     colon cancer  . Hypertension Other   . Kidney disease Other   . Diabetes Other   . Heart disease Mother     MI at age 37  . Cervical  cancer Mother   . Cancer Mother   . Diabetes Mother   . Hypertension Mother   . Lung cancer Father 88    primary site lung CA then colon and bone  . Colon cancer Father   . Cancer Father   . Hypertension Father    History  Substance Use Topics  . Smoking status: Former Smoker -- 0.20 packs/day for 50 years    Types: Cigarettes    Quit date: 11/13/2012  . Smokeless tobacco: Never Used     Comment: PT HAS BEEN QUITTING--  SMOKES 4 CIG'S PER DAY,  DOWN FROM 1PPD  . Alcohol Use: No     Comment: occasional wine not often   OB History    No data available     Review of Systems  Constitutional: Negative for fever and chills.  HENT: Negative for nosebleeds.   Eyes: Negative for visual disturbance.  Respiratory: Positive for shortness of breath. Negative for cough.   Cardiovascular: Positive for chest pain and leg swelling (bilateral ankle).  Gastrointestinal: Negative for nausea, vomiting, abdominal pain, diarrhea and constipation.  Genitourinary: Negative for dysuria.  Skin: Negative for rash.  Neurological: Negative for weakness.  All other systems reviewed and are negative.     Allergies  Review of patient's allergies indicates no known allergies.  Home Medications   Prior to Admission medications   Medication Sig Start Date End Date Taking? Authorizing Provider  aspirin EC 325 MG EC tablet Take 1 tablet (325 mg total) by mouth daily. 11/19/12  Yes Erin R Barrett, PA-C  carvedilol (COREG) 12.5 MG tablet Take 1.5 tablets (18.75 mg total) by mouth 2 (two) times daily with a meal. 11/27/13  Yes Lelon Perla, MD  furosemide (LASIX) 20 MG tablet Take 20 mg by mouth daily.   Yes Historical Provider, MD  glucose blood (ACCU-CHEK SMARTVIEW) test strip 1 each by Other route See admin instructions. Check blood sugar 4 times daily.   Yes Historical Provider, MD  hydrALAZINE (APRESOLINE) 25 MG tablet TAKE 1 TABLET THREE TIMES DAILY 04/03/14  Yes Lelon Perla, MD  Hypromellose  (ARTIFICIAL TEARS OP) Place 1 drop into both eyes 4 (four) times daily as needed (for dry eyes).   Yes Historical Provider, MD  isosorbide mononitrate (IMDUR) 30 MG 24 hr tablet TAKE 1 TABLET EVERY DAY 04/03/14  Yes Lelon Perla, MD  metFORMIN (GLUCOPHAGE) 500 MG tablet Take 1 tablet (500 mg total) by mouth 2 (two) times daily with a meal. 11/07/13  Yes Biagio Borg, MD  Multiple Vitamin (MULTIVITAMIN) tablet Take 1 tablet by mouth daily.   Yes Historical Provider, MD  Omega-3 Fatty Acids (FISH OIL) 1000 MG CAPS Take 1,000 mg by  mouth daily.    Yes Historical Provider, MD  pravastatin (PRAVACHOL) 40 MG tablet TAKE 1 TABLET EVERY EVENING 04/03/14  Yes Lelon Perla, MD   BP 166/51 mmHg  Pulse 69  Temp(Src) 97.4 F (36.3 C) (Oral)  Resp 17  SpO2 96% Physical Exam  Constitutional: She is oriented to person, place, and time. No distress.  HENT:  Head: Normocephalic and atraumatic.  Eyes: EOM are normal. Pupils are equal, round, and reactive to light.  Neck: Normal range of motion. Neck supple.  Cardiovascular: Normal rate and intact distal pulses.   Pulmonary/Chest: No respiratory distress. She has no wheezes. She has no rales.  Diminished BS bilaterally  Abdominal: Soft. There is no tenderness. There is no rebound and no guarding.  Musculoskeletal: Normal range of motion.  Neurological: She is alert and oriented to person, place, and time.  Skin: No rash noted. She is not diaphoretic.  Psychiatric: She has a normal mood and affect.    ED Course  Procedures (including critical care time) Labs Review Labs Reviewed  CBC  COMPREHENSIVE METABOLIC PANEL  BRAIN NATRIURETIC PEPTIDE  D-DIMER, QUANTITATIVE    Imaging Review No results found.   EKG Interpretation   Date/Time:  Sunday May 06 2014 20:32:40 EDT Ventricular Rate:  81 PR Interval:  195 QRS Duration: 96 QT Interval:  403 QTC Calculation: 468 R Axis:   93 Text Interpretation:  Sinus rhythm Right axis deviation  Non-specific ST-t  changes No significant change since last tracing Confirmed by Christy Gentles   MD, Elenore Rota ([id number]) on 05/06/2014 8:37:37 PM      MDM   Final diagnoses:  None    74 y/o female w/ PMH CAD, lung ca, COPD who comes in w/ complaint of SOB/CP for the past three days.  The shortness of breath is intermittent and is accompanied by the chest pain.  Both sx will come on for approx 2 hours at a time and then improve.  She notes that the episodes she was having today were worse than previous episodes.    The chest pain is R sided, pressure like, w/ no alleviating/aggravating factors.  Exam as above, patient is afebrile, HDS, satting 98% on RA.  Lung sounds diminished bilaterally but no wheezes heard.  Concern for COPD exacerbation vs. ACS vs. PE  She has a wells score of 1, using the dichotomized well's, feel she is safe for d-dimer rule out. EKG obtained and w/o new ishcemic changes. Will get cbc/bmp/trop/bnp/cxr  Cbc/bmp unremarkable.  First trop neg.  BNP 830.  No sig edema or opacity on CXR D-dimer +  Have obtained CTA PE study which was neg for PE  Given patients hx of CAD, she is high risk for ACS.  Will admit for ACS rule out.  Have spoken w/ hospitalist Dr. Arnoldo Morale and arrangements for admission made.   Jarome Matin, MD 05/07/14 [id number]  Ripley Fraise, MD 05/07/14 [phone]

## 2014-05-07 ENCOUNTER — Telehealth: Payer: Self-pay | Admitting: *Deleted

## 2014-05-07 ENCOUNTER — Encounter (HOSPITAL_COMMUNITY): Payer: Self-pay | Admitting: Student

## 2014-05-07 ENCOUNTER — Encounter: Payer: Self-pay | Admitting: *Deleted

## 2014-05-07 ENCOUNTER — Other Ambulatory Visit: Payer: Self-pay | Admitting: *Deleted

## 2014-05-07 DIAGNOSIS — I739 Peripheral vascular disease, unspecified: Secondary | ICD-10-CM | POA: Diagnosis present

## 2014-05-07 DIAGNOSIS — I10 Essential (primary) hypertension: Secondary | ICD-10-CM | POA: Diagnosis present

## 2014-05-07 DIAGNOSIS — Z87891 Personal history of nicotine dependence: Secondary | ICD-10-CM | POA: Diagnosis not present

## 2014-05-07 DIAGNOSIS — R0602 Shortness of breath: Secondary | ICD-10-CM

## 2014-05-07 DIAGNOSIS — I25110 Atherosclerotic heart disease of native coronary artery with unstable angina pectoris: Secondary | ICD-10-CM

## 2014-05-07 DIAGNOSIS — I249 Acute ischemic heart disease, unspecified: Secondary | ICD-10-CM | POA: Diagnosis present

## 2014-05-07 DIAGNOSIS — Z79899 Other long term (current) drug therapy: Secondary | ICD-10-CM | POA: Diagnosis not present

## 2014-05-07 DIAGNOSIS — R079 Chest pain, unspecified: Secondary | ICD-10-CM | POA: Diagnosis present

## 2014-05-07 DIAGNOSIS — I2510 Atherosclerotic heart disease of native coronary artery without angina pectoris: Secondary | ICD-10-CM | POA: Diagnosis present

## 2014-05-07 DIAGNOSIS — I5022 Chronic systolic (congestive) heart failure: Secondary | ICD-10-CM | POA: Diagnosis present

## 2014-05-07 DIAGNOSIS — I25119 Atherosclerotic heart disease of native coronary artery with unspecified angina pectoris: Secondary | ICD-10-CM

## 2014-05-07 DIAGNOSIS — J449 Chronic obstructive pulmonary disease, unspecified: Secondary | ICD-10-CM | POA: Diagnosis present

## 2014-05-07 DIAGNOSIS — Z8551 Personal history of malignant neoplasm of bladder: Secondary | ICD-10-CM | POA: Diagnosis not present

## 2014-05-07 DIAGNOSIS — J438 Other emphysema: Secondary | ICD-10-CM

## 2014-05-07 DIAGNOSIS — Z951 Presence of aortocoronary bypass graft: Secondary | ICD-10-CM | POA: Diagnosis not present

## 2014-05-07 DIAGNOSIS — E119 Type 2 diabetes mellitus without complications: Secondary | ICD-10-CM

## 2014-05-07 DIAGNOSIS — Z90710 Acquired absence of both cervix and uterus: Secondary | ICD-10-CM | POA: Diagnosis not present

## 2014-05-07 DIAGNOSIS — C3492 Malignant neoplasm of unspecified part of left bronchus or lung: Secondary | ICD-10-CM

## 2014-05-07 DIAGNOSIS — C3431 Malignant neoplasm of lower lobe, right bronchus or lung: Secondary | ICD-10-CM | POA: Diagnosis present

## 2014-05-07 DIAGNOSIS — E785 Hyperlipidemia, unspecified: Secondary | ICD-10-CM | POA: Diagnosis present

## 2014-05-07 DIAGNOSIS — Z7982 Long term (current) use of aspirin: Secondary | ICD-10-CM | POA: Diagnosis not present

## 2014-05-07 LAB — HEPARIN LEVEL (UNFRACTIONATED)

## 2014-05-07 LAB — CBC
HCT: 31.8 % — ABNORMAL LOW (ref 36.0–46.0)
HEMOGLOBIN: 10.9 g/dL — AB (ref 12.0–15.0)
MCH: 30.5 pg (ref 26.0–34.0)
MCHC: 34.3 g/dL (ref 30.0–36.0)
MCV: 89.1 fL (ref 78.0–100.0)
PLATELETS: 244 10*3/uL (ref 150–400)
RBC: 3.57 MIL/uL — AB (ref 3.87–5.11)
RDW: 13.7 % (ref 11.5–15.5)
WBC: 11.7 10*3/uL — ABNORMAL HIGH (ref 4.0–10.5)

## 2014-05-07 LAB — BASIC METABOLIC PANEL
ANION GAP: 11 (ref 5–15)
BUN: 15 mg/dL (ref 6–23)
CHLORIDE: 102 mmol/L (ref 96–112)
CO2: 24 mmol/L (ref 19–32)
Calcium: 8.8 mg/dL (ref 8.4–10.5)
Creatinine, Ser: 0.71 mg/dL (ref 0.50–1.10)
GFR, EST NON AFRICAN AMERICAN: 83 mL/min — AB (ref >90)
Glucose, Bld: 205 mg/dL — ABNORMAL HIGH (ref 70–99)
POTASSIUM: 3.6 mmol/L (ref 3.5–5.1)
SODIUM: 137 mmol/L (ref 135–145)

## 2014-05-07 LAB — GLUCOSE, CAPILLARY
GLUCOSE-CAPILLARY: 162 mg/dL — AB (ref 70–99)
GLUCOSE-CAPILLARY: 110 mg/dL — AB (ref 70–99)
Glucose-Capillary: 199 mg/dL — ABNORMAL HIGH (ref 70–99)
Glucose-Capillary: 275 mg/dL — ABNORMAL HIGH (ref 70–99)
Glucose-Capillary: 157 mg/dL — ABNORMAL HIGH (ref 70–99)

## 2014-05-07 LAB — TROPONIN I
TROPONIN I: 0.21 ng/mL — AB (ref <0.031)
TROPONIN I: 0.18 ng/mL — AB (ref <0.031)
Troponin I: 0.22 ng/mL — ABNORMAL HIGH (ref <0.031)

## 2014-05-07 MED ORDER — ONDANSETRON HCL 4 MG/2ML IJ SOLN: 4.0000 mg | Freq: Four times a day (QID) | INTRAMUSCULAR | Status: DC | PRN

## 2014-05-07 MED ORDER — ONDANSETRON HCL 4 MG PO TABS: 4.0000 mg | ORAL_TABLET | Freq: Four times a day (QID) | ORAL | Status: DC | PRN

## 2014-05-07 MED ORDER — HYDRALAZINE HCL 20 MG/ML IJ SOLN: 5.0000 mg | INTRAMUSCULAR | Status: DC | PRN

## 2014-05-07 MED ORDER — CARVEDILOL 6.25 MG PO TABS
18.7500 mg | ORAL_TABLET | Freq: Two times a day (BID) | ORAL | Status: DC
  Administered 2014-05-07 – 2014-05-08 (×3): 18.75 mg via ORAL
  Filled 2014-05-07 (×5): qty 1

## 2014-05-07 MED ORDER — ALUM & MAG HYDROXIDE-SIMETH 200-200-20 MG/5ML PO SUSP: 30.0000 mL | Freq: Four times a day (QID) | ORAL | Status: DC | PRN

## 2014-05-07 MED ORDER — ACETAMINOPHEN 325 MG PO TABS: 650.0000 mg | ORAL_TABLET | Freq: Four times a day (QID) | ORAL | Status: DC | PRN

## 2014-05-07 MED ORDER — OXYCODONE HCL 5 MG PO TABS: 5.0000 mg | ORAL_TABLET | ORAL | Status: DC | PRN

## 2014-05-07 MED ORDER — INSULIN ASPART 100 UNIT/ML ~~LOC~~ SOLN: 0.0000 [IU] | Freq: Every day | SUBCUTANEOUS | Status: DC

## 2014-05-07 MED ORDER — FUROSEMIDE 20 MG PO TABS
20.0000 mg | ORAL_TABLET | Freq: Every day | ORAL | Status: DC
  Administered 2014-05-07 – 2014-05-08 (×2): 20 mg via ORAL
  Filled 2014-05-07 (×2): qty 1

## 2014-05-07 MED ORDER — NITROGLYCERIN 2 % TD OINT
1.0000 [in_us] | TOPICAL_OINTMENT | Freq: Four times a day (QID) | TRANSDERMAL | Status: DC
  Administered 2014-05-07 – 2014-05-08 (×6): 1 [in_us] via TOPICAL
  Filled 2014-05-07: qty 30

## 2014-05-07 MED ORDER — ENOXAPARIN SODIUM 30 MG/0.3ML ~~LOC~~ SOLN
30.0000 mg | SUBCUTANEOUS | Status: DC
  Filled 2014-05-07: qty 0.3

## 2014-05-07 MED ORDER — SODIUM CHLORIDE 0.9 % IJ SOLN: 3.0000 mL | INTRAMUSCULAR | Status: DC | PRN

## 2014-05-07 MED ORDER — HYDROMORPHONE HCL 1 MG/ML IJ SOLN: 0.5000 mg | INTRAMUSCULAR | Status: DC | PRN

## 2014-05-07 MED ORDER — PRAVASTATIN SODIUM 40 MG PO TABS
40.0000 mg | ORAL_TABLET | Freq: Every evening | ORAL | Status: DC
  Administered 2014-05-07: 40 mg via ORAL
  Filled 2014-05-07 (×2): qty 1

## 2014-05-07 MED ORDER — HEPARIN (PORCINE) IN NACL 100-0.45 UNIT/ML-% IJ SOLN
900.0000 [IU]/h | INTRAMUSCULAR | Status: DC
  Administered 2014-05-07: 500 [IU]/h via INTRAVENOUS
  Filled 2014-05-07 (×2): qty 250

## 2014-05-07 MED ORDER — SODIUM CHLORIDE 0.9 % IV SOLN: 250.0000 mL | INTRAVENOUS | Status: DC | PRN

## 2014-05-07 MED ORDER — SODIUM CHLORIDE 0.9 % IJ SOLN
3.0000 mL | Freq: Two times a day (BID) | INTRAMUSCULAR | Status: DC
  Administered 2014-05-07 (×2): 3 mL via INTRAVENOUS

## 2014-05-07 MED ORDER — ADULT MULTIVITAMIN W/MINERALS CH
1.0000 | ORAL_TABLET | Freq: Every day | ORAL | Status: DC
  Administered 2014-05-07 – 2014-05-08 (×2): 1 via ORAL
  Filled 2014-05-07 (×2): qty 1

## 2014-05-07 MED ORDER — HEPARIN BOLUS VIA INFUSION
2000.0000 [IU] | Freq: Once | INTRAVENOUS | Status: AC
  Administered 2014-05-07: 2000 [IU] via INTRAVENOUS
  Filled 2014-05-07: qty 2000

## 2014-05-07 MED ORDER — ASPIRIN EC 325 MG PO TBEC
325.0000 mg | DELAYED_RELEASE_TABLET | Freq: Every day | ORAL | Status: DC
  Administered 2014-05-07 – 2014-05-08 (×2): 325 mg via ORAL
  Filled 2014-05-07 (×2): qty 1

## 2014-05-07 MED ORDER — HEPARIN BOLUS VIA INFUSION
1000.0000 [IU] | Freq: Once | INTRAVENOUS | Status: AC
  Administered 2014-05-07: 1000 [IU] via INTRAVENOUS
  Filled 2014-05-07: qty 1000

## 2014-05-07 MED ORDER — SODIUM CHLORIDE 0.9 % IJ SOLN
3.0000 mL | Freq: Two times a day (BID) | INTRAMUSCULAR | Status: DC
  Administered 2014-05-07: 3 mL via INTRAVENOUS

## 2014-05-07 MED ORDER — ACETAMINOPHEN 650 MG RE SUPP: 650.0000 mg | Freq: Four times a day (QID) | RECTAL | Status: DC | PRN

## 2014-05-07 MED ORDER — OMEGA-3-ACID ETHYL ESTERS 1 G PO CAPS
1.0000 g | ORAL_CAPSULE | Freq: Every day | ORAL | Status: DC
  Administered 2014-05-07 – 2014-05-08 (×2): 1 g via ORAL
  Filled 2014-05-07 (×3): qty 1

## 2014-05-07 MED ORDER — HYDRALAZINE HCL 25 MG PO TABS
25.0000 mg | ORAL_TABLET | Freq: Three times a day (TID) | ORAL | Status: DC
  Administered 2014-05-07 – 2014-05-08 (×4): 25 mg via ORAL
  Filled 2014-05-07 (×6): qty 1

## 2014-05-07 MED ORDER — INSULIN ASPART 100 UNIT/ML ~~LOC~~ SOLN
0.0000 [IU] | Freq: Three times a day (TID) | SUBCUTANEOUS | Status: DC
  Administered 2014-05-07: 5 [IU] via SUBCUTANEOUS
  Administered 2014-05-07 – 2014-05-08 (×2): 2 [IU] via SUBCUTANEOUS
  Administered 2014-05-08: 1 [IU] via SUBCUTANEOUS

## 2014-05-07 NOTE — Consult Note (Signed)
CARDIOLOGY CONSULT NOTE   Patient ID: Tonya Valdez MRN: [id number] DOB/AGE: 1940-04-07 74 y.o.  Admit date: 05/06/2014  Primary Physician   Cathlean Cower, MD Primary Cardiologist   Dr. Stanford Breed  Reason for Consultation   Chest pain, elevated troponin  Tonya Valdez is a 74 y.o. year old female with a known cardiac history of CAD, s/p CABG (L-LAD, S-OM, S-PDA) in 10/2012 with Dr. Cyndia Bent, carotid US (8/14): R 80-99; L 60-79 => s/p R CEA in 10/2012 with Dr. 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   She began having SOB at rest this past Friday, it got worse with exertion, lasted an hour or so then resolved on its own. On Saturday, she again felt SOB at rest and with exertion which lasted a few hours then again resolved on its own.   On Sunday, the SOB began again and this time she became diaphoretic with chest tightness at a 10/10. Her heart rate was very rapid and she thinks it was regular. She took her pulse and got 156 bpm. She did not have palpitations.   The SOB continued to get worse and with the associated chest tightness she found it extremely difficult to take a deep breath. After 4 hours with no relief she called EMS and was brought to [facility]. There was no radiation of the chest tightness, no nausea or dizziness. There was nothing she did at home that made the SOB worse or better.    While being transported by EMS she was placed on oxygen, given a baby aspirin, and nitroglycerin x 3 which she felt helped some. There is no mention of an elevated HR in any of the ER notes.     Past Medical History  Diagnosis Date  . Positive TB test   . Bladder tumor   . COPD (chronic obstructive pulmonary disease)   . Diabetes mellitus TYPE II--  PER PCP NOTE DR Cathlean Cower    PER PT DOES NOT TAKES METFORMIN PRESCRIBED, SHE WATCHES DIET AND EXERCISES  . Cancer     bladder  . H/O hiatal hernia    s/p  74 yrs old  . Chronic systolic CHF (congestive heart failure) 08/04/2012    Echo (08/17/12): EF 30%, diffuse HK, mild MR, mild BAE, PASP.;  Echo (01/2013): EF 40-45%, inferior posterior HK, Gr 2 DD, mild MR, mild LAE  . Ischemic cardiomyopathy   . CAD (coronary artery disease)     a. LHC (10/07/12):  Ostial LM 60%, distal LM 40-50%, oLAD 40-50%, mLAD 70-80%, pCFX subtotally occluded, mCFX 80-90%, oRCA occluded, ostial L subclavian 40%, EF 25-30%, abdominal aorta diffusely diseased with severe ulcerated plaquing (right iliac ostial 95%; left renal long 60-70%, right okay). CO 2.1, CI 1.5 => s/p CABG (L-LAD, S-OM, S-PDA) 10/2012 with Dr. Cyndia Bent   . Bilateral carotid artery disease 09/26/2012    carotid US (8/14):  R 80-99; L 60-79 => s/p R CEA 10/2012 (Dr. Donnetta Hutching)  . Lung nodule     chest CT 09/2012 => needs repeat by 03/2013  . PAD (peripheral artery disease)   . Hyperlipidemia   . Hyperkalemia     ACEI d/c'd 11/2012  . Bladder cancer 04/26/2013     Past Surgical History  Procedure Laterality Date  . Tonsillectomy  1959  . Hiatal hernia repair  1964  (AGE 51)    CHOLECYSTECTOMY AND APPENDECTOMY  .  Left elbow surgery    . Lumbar disc surgery  1993  . Abdominal hysterectomy  1981 (APPROX)    BILATERAL SALPINGO-OOPHORECTOMY WITH EXCEPTION A SMALL PIECE OF OVARY REMAINS  . Transurethral resection of bladder tumor N/A 06/24/2012    Procedure: TRANSURETHRAL RESECTION OF BLADDER TUMOR  WITH GYRUS AND INSTILLATION OF MITOMYCIN C  (TURBT);  Surgeon: Claybon Jabs, MD;  Location: [facility];  Service: Urology;  Laterality: N/A;  . Appendectomy    . Wrist surgery Left   . Cholecystectomy    . Tonsillectomy    . Cardiac catheterization    . Hammer toe surgery Left   . Coronary artery bypass graft N/A 11/14/2012    Procedure: CORONARY ARTERY BYPASS GRAFTING (CABG) times three using left internal mammary artery and left saphenous vein;  Surgeon: Gaye Pollack, MD; L-LAD, S-OM, S-PDA   .  Intraoperative transesophageal echocardiogram N/A 11/14/2012    Procedure: INTRAOPERATIVE TRANSESOPHAGEAL ECHOCARDIOGRAM;  Surgeon: Gaye Pollack, MD;  [facility] OR;  Service: Open Heart Surgery;  Laterality: N/A;  . Endarterectomy Right 11/14/2012    Procedure: RIGHT ENDARTERECTOMY CAROTID with patch angioplasty.;  Surgeon: Rosetta Posner, MD;  Location: [facility] OR;  Service: Vascular;  Laterality: Right;    No Known Allergies  I have reviewed the patient's current medications . aspirin EC  325 mg Oral Daily  . carvedilol  18.75 mg Oral BID WC  . furosemide  20 mg Oral Daily  . hydrALAZINE  25 mg Oral TID  . insulin aspart  0-5 Units Subcutaneous QHS  . insulin aspart  0-9 Units Subcutaneous TID WC  . multivitamin with minerals  1 tablet Oral Daily  . nitroGLYCERIN  1 inch Topical 4 times per day  . omega-3 acid ethyl esters  1 g Oral Daily  . pravastatin  40 mg Oral QPM  . sodium chloride  3 mL Intravenous Q12H  . sodium chloride  3 mL Intravenous Q12H   . heparin 500 Units/hr (05/07/14 0423)   sodium chloride, acetaminophen **OR** acetaminophen, alum & mag hydroxide-simeth, hydrALAZINE, HYDROmorphone (DILAUDID) injection, ondansetron **OR** ondansetron (ZOFRAN) IV, oxyCODONE, sodium chloride  Medication Sig  aspirin EC 325 MG EC tablet Take 1 tablet (325 mg total) by mouth daily.  carvedilol (COREG) 12.5 MG tablet Take 1.5 tablets (18.75 mg total) by mouth 2 (two) times daily with a meal.  furosemide (LASIX) 20 MG tablet Take 20 mg by mouth daily.  glucose blood (ACCU-CHEK SMARTVIEW) test strip 1 each by Other route See admin instructions. Check blood sugar 4 times daily.  hydrALAZINE (APRESOLINE) 25 MG tablet TAKE 1 TABLET THREE TIMES DAILY  Hypromellose (ARTIFICIAL TEARS OP) Place 1 drop into both eyes 4 (four) times daily as needed (for dry eyes).  isosorbide mononitrate (IMDUR) 30 MG 24 hr tablet TAKE 1 TABLET EVERY DAY  metFORMIN (GLUCOPHAGE) 500 MG tablet Take 1 tablet (500 mg  total) by mouth 2 (two) times daily with a meal.  Multiple Vitamin (MULTIVITAMIN) tablet Take 1 tablet by mouth daily.  Omega-3 Fatty Acids (FISH OIL) 1000 MG CAPS Take 1,000 mg by mouth daily.   pravastatin (PRAVACHOL) 40 MG tablet TAKE 1 TABLET EVERY EVENING     History   Social History  . Marital Status: Widowed    Spouse Name: N/A  . Number of Children: 2  . Years of Education: 16   Occupational History  . RN, BSN    Social History Main Topics  . Smoking status: Former  Smoker -- 0.20 packs/day for 50 years    Types: Cigarettes    Quit date: 11/13/2012  . Smokeless tobacco: Never Used     Comment: PT HAS BEEN QUITTING--  SMOKES 4 CIG'S PER DAY,  DOWN FROM 1PPD  . Alcohol Use: No     Comment: occasional wine not often  . Drug Use: No  . Sexual Activity: Not on file   Other Topics Concern  . Not on file   Social History Narrative    Family Status  Relation Status Death Age  . Mother Deceased   . Father Deceased 49    lung cancer   Family History  Problem Relation Age of Onset  . Cancer Other     colon cancer  . Hypertension Other   . Kidney disease Other   . Diabetes Other   . Heart disease Mother     MI at age 58  . Cervical cancer Mother   . Cancer Mother   . Diabetes Mother   . Hypertension Mother   . Lung cancer Father 84    primary site lung CA then colon and bone  . Colon cancer Father   . Cancer Father   . Hypertension Father      ROS:  Full 14 point review of systems complete and found to be negative unless listed above.  Physical Exam: Blood pressure 174/57, pulse 68, temperature 98.6 F (37 C), temperature source Oral, resp. rate 20, height 5\' 2"  (1.575 m), weight 97 lb (43.999 kg), SpO2 95 %.  General: Well developed, well nourished, female in no acute distress Head: Eyes PERRLA, No xanthomas.   Normocephalic and atraumatic, oropharynx without edema or exudate. Dentition: poor Lungs: decreased BS bases Heart: HRRR S1 S2, no rub/gallop,  soft murmur. pulses are 2+ all 4 extrem.   Neck: Left carotid bruits. No lymphadenopathy.  JVD minimal elevation. Abdomen: Bowel sounds present, abdomen soft and non-tender without masses or hernias noted. Msk:  No spine or cva tenderness. No weakness, no joint deformities or effusions. Extremities: No clubbing or cyanosis. No edema.  Neuro: Alert and oriented X 3. No focal deficits noted. Psych:  Good affect, responds appropriately Skin: No rashes or lesions noted.  Labs:   Lab Results  Component Value Date   WBC 11.7* 05/07/2014   HGB 10.9* 05/07/2014   HCT 31.8* 05/07/2014   MCV 89.1 05/07/2014   PLT 244 05/07/2014    Recent Labs Lab 05/06/14 2138 05/07/14 0615  NA 137 137  K 3.4* 3.6  CL 101 102  CO2 25 24  BUN 16 15  CREATININE 0.73 0.71  CALCIUM 9.0 8.8  PROT 6.7  --   BILITOT 0.5  --   ALKPHOS 87  --   ALT 27  --   AST 41*  --   GLUCOSE 211* 205*  ALBUMIN 3.2*  --    MAGNESIUM  Date Value Ref Range Status  11/15/2012 1.8 1.5 - 2.5 mg/dL Final    Recent Labs  05/07/14 0128 05/07/14 0615  TROPONINI 0.21* 0.22*    Recent Labs  05/06/14 2145  TROPIPOC 0.04   B NATRIURETIC PEPTIDE  Date/Time Value Ref Range Status  05/06/2014 09:38 PM 829.1* 0.0 - 100.0 pg/mL Final   Lab Results  Component Value Date   CHOL 113 05/02/2014   HDL 49.90 05/02/2014   LDLCALC 42 05/02/2014   TRIG 104.0 05/02/2014   Lab Results  Component Value Date   DDIMER 3.35* 05/06/2014  TSH  Date/Time Value Ref Range Status  05/02/2014 03:05 PM 3.45 0.35 - 4.50 uIU/mL Final    Echo: 02/14/2013 Conclusions - Left ventricle: The cavity size was normal. Systolic function was mildly to moderately reduced. The estimated ejection fraction was in the range of 40% to 45%. There is severe hypokinesis of the inferior posterior myocardium. Features are consistent with a pseudonormal left ventricular filling pattern, with concomitant abnormal relaxation and  increased filling pressure (grade 2 diastolic dysfunction). Doppler parameters are consistent with high ventricular filling pressure. - Mitral valve: Mild regurgitation. - Left atrium: The atrium was mildly dilated. Impressions: - Compared to 08/17/12, LV function has improved.clusions  ECG:  05/06/2014 SR, R axis deviation  Radiology:  Ct Angio Chest Pe W/cm &/or Wo Cm 05/06/2014   CLINICAL DATA:  Intermittent dyspnea since Friday.  EXAM: CT ANGIOGRAPHY CHEST WITH CONTRAST  TECHNIQUE: Multidetector CT imaging of the chest was performed using the standard protocol during bolus administration of intravenous contrast. Multiplanar CT image reconstructions and MIPs were obtained to evaluate the vascular anatomy.  CONTRAST:  57mL OMNIPAQUE IOHEXOL 350 MG/ML SOLN  COMPARISON:  05/06/2014 radiograph, chest CT 04/18/2014  FINDINGS: Cardiovascular: There is good opacification of the pulmonary arteries. There is no pulmonary embolism. The thoracic aorta is normal in caliber and intact.  Lungs: They known right lower lobe carcinoma appears unchanged from 04/18/2014. 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 No acute infiltrate or effusion is evident.  Central airways: Patent  Effusions: None  Lymphadenopathy: None  Esophagus: Unremarkable  Upper abdomen: No significant abnormality  Musculoskeletal: No significant abnormality  Review of the MIP images confirms the above findings.  IMPRESSION: Negative for pulmonary embolism. Unchanged right lower lobe mass. No acute findings are evident.   Electronically Signed   By: Andreas Newport M.D.   On: 05/06/2014 23:29   Dg Chest Portable 1 View 05/06/2014   CLINICAL DATA:  Acute onset of shortness of breath. Known right lower lobe pulmonary nodules. Initial encounter.  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 It is again seen. Minimal bibasilar opacities likely reflect atelectasis. Mild scarring is noted at the lung apices. The subsolid pulmonary nodule within the right lower lobe is not well characterized on radiograph.  No pleural effusion or pneumothorax is seen. The cardiomediastinal silhouette remains normal in size. The patient is status post median sternotomy. No acute osseous abnormalities are identified.  IMPRESSION: 1. 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 2. Minimal bibasilar opacities likely reflect atelectasis; mild scarring at the lung apices. Additional known subsolid pulmonary nodule in the right lower lobe is not well characterized on radiograph.   Electronically Signed   By: Garald Balding M.D.   On: 05/06/2014 21:54    ASSESSMENT AND PLAN:   The patient was seen today by Dr. Sallyanne Kuster, the patient evaluated and the data reviewed.  Active Problems:   COPD (chronic obstructive pulmonary disease) - per IM    Diabetes - Per IM    CAD (coronary artery disease) - see below    Bladder cancer - per IM    Essential hypertension - SBP 150s-170s - can increase hydralazine, but may also benefit from amlodipine    ACS (acute coronary syndrome) - minimal elevation in troponin - in the setting of tachycardia by  her reports - baseline bradycardia on 18.75 mg Coreg BID so no dose increase - 2 yr out from CABG, had apical LAD disease at cath, may be cause of ischemia in the setting of tachycardia. - MD advise further plan.     Shortness of breath - in setting of chest pain, tachycardia - is OK now that symptoms have resolved.    Chronic systolic CHF (congestive heart failure) - volume status seems OK despite elevated BNP - CXR with minimal bibasilar atelectasis    Non-small cell cancer of left lung - per IM  Signed: Rosaria Ferries, [facility]  05/07/2014 12:46 PM Beeper [phone]  Co-Sign MD  I have seen  and examined the patient along with Rosaria Ferries, PA.  I have reviewed the chart, notes and new data.  I agree with PA's note.  Key new complaints: feels well at this time; symptoms had fairly abrupt onset and abrupt resolution despite no intervention Key examination changes: no clinical signs of CHF; quiet lungs c/w emphysema Key new findings / data: PVCs on telemetry, otherwise no arrhythmia  PLAN: Echo today Outpatient nuclear perfusion study If no arrhythmia on telemetry, outpatient event monitor.  Sanda Klein, MD, [place] [phone] 05/07/2014, 3:28 PM

## 2014-05-07 NOTE — Progress Notes (Signed)
ANTICOAGULATION CONSULT NOTE - Initial Consult  Pharmacy Consult for heparin Indication: chest pain/ACS  No Known Allergies  Patient Measurements: Height: 5\' 2"  (157.5 cm) Weight: 97 lb (43.999 kg) IBW/kg (Calculated) : 50.1  Vital Signs: Temp: 98.6 F (37 C) (03/14 0100) Temp Source: Oral (03/14 0100) BP: 164/67 mmHg (03/14 0100) Pulse Rate: 62 (03/14 0100)  Labs:  Recent Labs  05/06/14 2138 05/07/14 0128  HGB 10.8*  --   HCT 32.0*  --   PLT 255  --   CREATININE 0.73  --   TROPONINI  --  0.21*    Estimated Creatinine Clearance: 42.9 mL/min (by C-G formula based on Cr of 0.73).   Medical History: Past Medical History  Diagnosis Date  . Positive TB test   . Bladder tumor   . COPD (chronic obstructive pulmonary disease)   . Diabetes mellitus TYPE II--  PER PCP NOTE DR Cathlean Cower    PER PT DOES NOT TAKES METFORMIN PRESCRIBED, SHE WATCHES DIET AND EXERCISES  . Cancer     bladder  . H/O hiatal hernia     s/p  74 yrs old  . Chronic systolic CHF (congestive heart failure) 08/04/2012    Echo (08/17/12): EF 30%, diffuse HK, mild MR, mild BAE, PASP.;  Echo (01/2013): EF 40-45%, inferior posterior HK, Gr 2 DD, mild MR, mild LAE  . Ischemic cardiomyopathy   . CAD (coronary artery disease)     a. LHC (10/07/12):  Ostial LM 60%, distal LM 40-50%, oLAD 40-50%, mLAD 70-80%, pCFX subtotally occluded, mCFX 80-90%, oRCA occluded, ostial L subclavian 40%, EF 25-30%, abdominal aorta diffusely diseased with severe ulcerated plaquing (right iliac ostial 95%; left renal long 60-70%, right okay). CO 2.1, CI 1.5 => s/p CABG (L-LAD, S-OM, S-PDA) 10/2012 with Dr. Cyndia Bent   . Bilateral carotid artery disease 09/26/2012    carotid US (8/14):  R 80-99; L 60-79 => s/p R CEA 10/2012 (Dr. Donnetta Hutching)  . Lung nodule     chest CT 09/2012 => needs repeat by 03/2013  . PAD (peripheral artery disease)   . Hyperlipidemia   . Hyperkalemia     ACEI d/c'd 11/2012  . Bladder cancer 04/26/2013    Medications:   Prescriptions prior to admission  Medication Sig Dispense Refill Last Dose  . aspirin EC 325 MG EC tablet Take 1 tablet (325 mg total) by mouth daily. 30 tablet 0 05/06/2014 at Unknown time  . carvedilol (COREG) 12.5 MG tablet Take 1.5 tablets (18.75 mg total) by mouth 2 (two) times daily with a meal. 270 tablet 3 05/06/2014 at 0500  . furosemide (LASIX) 20 MG tablet Take 20 mg by mouth daily.   05/06/2014 at Unknown time  . glucose blood (ACCU-CHEK SMARTVIEW) test strip 1 each by Other route See admin instructions. Check blood sugar 4 times daily.   Taking  . hydrALAZINE (APRESOLINE) 25 MG tablet TAKE 1 TABLET THREE TIMES DAILY 270 tablet 3 05/06/2014 at Unknown time  . Hypromellose (ARTIFICIAL TEARS OP) Place 1 drop into both eyes 4 (four) times daily as needed (for dry eyes).   05/06/2014 at Unknown time  . isosorbide mononitrate (IMDUR) 30 MG 24 hr tablet TAKE 1 TABLET EVERY DAY 90 tablet 3 05/06/2014 at Unknown time  . metFORMIN (GLUCOPHAGE) 500 MG tablet Take 1 tablet (500 mg total) by mouth 2 (two) times daily with a meal. 180 tablet 3 05/06/2014 at Unknown time  . Multiple Vitamin (MULTIVITAMIN) tablet Take 1 tablet by mouth daily.  05/06/2014 at Unknown time  . Omega-3 Fatty Acids (FISH OIL) 1000 MG CAPS Take 1,000 mg by mouth daily.    05/06/2014 at Unknown time  . pravastatin (PRAVACHOL) 40 MG tablet TAKE 1 TABLET EVERY EVENING 90 tablet 3 05/06/2014 at Unknown time   Scheduled:  . aspirin EC  325 mg Oral Daily  . carvedilol  18.75 mg Oral BID WC  . enoxaparin (LOVENOX) injection  30 mg Subcutaneous Q24H  . furosemide  20 mg Oral Daily  . hydrALAZINE  25 mg Oral TID  . insulin aspart  0-5 Units Subcutaneous QHS  . insulin aspart  0-9 Units Subcutaneous TID WC  . multivitamin with minerals  1 tablet Oral Daily  . nitroGLYCERIN  1 inch Topical 4 times per day  . omega-3 acid ethyl esters  1 g Oral Daily  . pravastatin  40 mg Oral QPM  . sodium chloride  3 mL Intravenous Q12H  . sodium  chloride  3 mL Intravenous Q12H    Assessment: 74yo female c/o SOB and chest pressure, now w/ elevated troponin, to begin heparin.  Goal of Therapy:  Heparin level 0.3-0.7 units/ml Monitor platelets by anticoagulation protocol: Yes   Plan:  Will give heparin 2000 units IV bolus x1 followed by gtt at 500 units/hr and monitor heparin levels and CBC.  Wynona Neat, PharmD, BCPS  05/07/2014,4:02 AM

## 2014-05-07 NOTE — Progress Notes (Signed)
ANTICOAGULATION CONSULT NOTE - F/u Consult  Pharmacy Consult for heparin Indication: chest pain/ACS  No Known Allergies  Patient Measurements: Height: 5\' 2"  (157.5 cm) Weight: 97 lb (43.999 kg) IBW/kg (Calculated) : 50.1  Vital Signs: Temp: 98.7 F (37.1 C) (03/14 1341) Temp Source: Oral (03/14 1341) BP: 126/60 mmHg (03/14 1341) Pulse Rate: 69 (03/14 1341)  Labs:  Recent Labs  05/06/14 2138 05/07/14 0128 05/07/14 0615 05/07/14 1303  HGB 10.8*  --  10.9*  --   HCT 32.0*  --  31.8*  --   PLT 255  --  244  --   HEPARINUNFRC  --   --   --  <0.10*  CREATININE 0.73  --  0.71  --   TROPONINI  --  0.21* 0.22* 0.18*    Estimated Creatinine Clearance: 42.9 mL/min (by C-G formula based on Cr of 0.71).   Medical History: Past Medical History  Diagnosis Date  . Positive TB test   . Bladder tumor   . COPD (chronic obstructive pulmonary disease)   . Diabetes mellitus TYPE II--  PER PCP NOTE DR Cathlean Cower    PER PT DOES NOT TAKES METFORMIN PRESCRIBED, SHE WATCHES DIET AND EXERCISES  . Cancer     bladder  . H/O hiatal hernia     s/p  74 yrs old  . Chronic systolic CHF (congestive heart failure) 08/04/2012    Echo (08/17/12): EF 30%, diffuse HK, mild MR, mild BAE, PASP.;  Echo (01/2013): EF 40-45%, inferior posterior HK, Gr 2 DD, mild MR, mild LAE  . Ischemic cardiomyopathy   . CAD (coronary artery disease)     a. LHC (10/07/12):  Ostial LM 60%, distal LM 40-50%, oLAD 40-50%, mLAD 70-80%, pCFX subtotally occluded, mCFX 80-90%, oRCA occluded, ostial L subclavian 40%, EF 25-30%, abdominal aorta diffusely diseased with severe ulcerated plaquing (right iliac ostial 95%; left renal long 60-70%, right okay). CO 2.1, CI 1.5 => s/p CABG (L-LAD, S-OM, S-PDA) 10/2012 with Dr. Cyndia Bent   . Bilateral carotid artery disease 09/26/2012    carotid US (8/14):  R 80-99; L 60-79 => s/p R CEA 10/2012 (Dr. Donnetta Hutching)  . Lung nodule     chest CT 09/2012 => needs repeat by 03/2013  . PAD (peripheral artery  disease)   . Hyperlipidemia   . Hyperkalemia     ACEI d/c'd 11/2012  . Bladder cancer 04/26/2013    Medications:  Prescriptions prior to admission  Medication Sig Dispense Refill Last Dose  . aspirin EC 325 MG EC tablet Take 1 tablet (325 mg total) by mouth daily. 30 tablet 0 05/06/2014 at Unknown time  . carvedilol (COREG) 12.5 MG tablet Take 1.5 tablets (18.75 mg total) by mouth 2 (two) times daily with a meal. 270 tablet 3 05/06/2014 at 0500  . furosemide (LASIX) 20 MG tablet Take 20 mg by mouth daily.   05/06/2014 at Unknown time  . glucose blood (ACCU-CHEK SMARTVIEW) test strip 1 each by Other route See admin instructions. Check blood sugar 4 times daily.   Taking  . hydrALAZINE (APRESOLINE) 25 MG tablet TAKE 1 TABLET THREE TIMES DAILY 270 tablet 3 05/06/2014 at Unknown time  . Hypromellose (ARTIFICIAL TEARS OP) Place 1 drop into both eyes 4 (four) times daily as needed (for dry eyes).   05/06/2014 at Unknown time  . isosorbide mononitrate (IMDUR) 30 MG 24 hr tablet TAKE 1 TABLET EVERY DAY 90 tablet 3 05/06/2014 at Unknown time  . metFORMIN (GLUCOPHAGE) 500 MG tablet Take 1  tablet (500 mg total) by mouth 2 (two) times daily with a meal. 180 tablet 3 05/06/2014 at Unknown time  . Multiple Vitamin (MULTIVITAMIN) tablet Take 1 tablet by mouth daily.   05/06/2014 at Unknown time  . Omega-3 Fatty Acids (FISH OIL) 1000 MG CAPS Take 1,000 mg by mouth daily.    05/06/2014 at Unknown time  . pravastatin (PRAVACHOL) 40 MG tablet TAKE 1 TABLET EVERY EVENING 90 tablet 3 05/06/2014 at Unknown time   Scheduled:  . aspirin EC  325 mg Oral Daily  . carvedilol  18.75 mg Oral BID WC  . furosemide  20 mg Oral Daily  . hydrALAZINE  25 mg Oral TID  . insulin aspart  0-5 Units Subcutaneous QHS  . insulin aspart  0-9 Units Subcutaneous TID WC  . multivitamin with minerals  1 tablet Oral Daily  . nitroGLYCERIN  1 inch Topical 4 times per day  . omega-3 acid ethyl esters  1 g Oral Daily  . pravastatin  40 mg Oral  QPM  . sodium chloride  3 mL Intravenous Q12H  . sodium chloride  3 mL Intravenous Q12H    Assessment: 74yo female c/o SOB and chest pressure w/ elevated troponin on IV heparin at 500 units/hr. F/u HL is undetectable. H/H low but stable. Plt wnl. No bleeding noted per RN   Goal of Therapy:  Heparin level 0.3-0.7 units/ml Monitor platelets by anticoagulation protocol: Yes   Plan:  Will give heparin 1000 units IV bolus then increase heparin gtt to 650 units/hr and monitor heparin levels and CBC. F/u MD plans   Albertina Parr, PharmD., BCPS Clinical Pharmacist Pager [phone]

## 2014-05-07 NOTE — Progress Notes (Signed)
UR completed 

## 2014-05-07 NOTE — Progress Notes (Signed)
Patient admitted earlier this morning. H&P reviewed. Patient seen and examined.  S: Patient denies any chest discomfort currently. No dizziness or lightheadedness. Still some shortness of breath with exertion.  O: Temperature 98.6. Heart rate 68. Respiratory rate 20. Blood pressure 174/57. Saturating 95% on room air.  Alert and oriented x3 Lungs reveal decreased air entry at the bases without any crackles or wheezing S1, S2 is normal, regular. No S3, S4.  Abdomen soft. Nontender. Nondistended. I'll sounds are present. No masses or organomegaly. No focal deficits  A/P: 74 year old female presenting with chest tightness and shortness of breath. She is noted to have minimally elevated troponin. EKG is nonspecific. Patient does have a known history of coronary artery disease and chronic systolic CHF. Patient has been placed on a heparin infusion. Cardiology has been consulted.  Patient also recently diagnosed with non-small cell lung cancer. Not a candidate for surgery. Plan is for radiation treatment, although she hasn't met with oncology yet.  Other issues are stable.  Plans per cardiology.  Tonya Valdez 05/07/2014 1:21 PM

## 2014-05-07 NOTE — Progress Notes (Signed)
Shift Event: Pt with elevated trop at 0.21, complains of central chest heaviness, denies sob, n/v/ diaphoresis.  Record reviewed, pt presented with chest heaviness, SOB and diaphoresis. At bedside, Caucasian female in NAD. Lungs-CTAB. Heart- RRR. Abd- +bs, sntnd. Extrem- no edema  Elevated Troponin/NSTEMI -Pt with known hx of CAD s/p CABG  -EKG-SR and nonspecific ST/T wave changes (unchanged from previous).  -Pt on full dose ASA, BB, Statin, Nitroglycerin.  -Will place on Heparin per pharmacy (Cards Fellow agrees). Per fellow, must call in morning for official Cards consult  Lacy Duverney [facility] [phone]

## 2014-05-07 NOTE — Progress Notes (Signed)
PA [facility] notified of positive troponin. Pt asymptomatic at this time; CC PTA significant SOB, chest pressure, diaphoresis. Inquired regarding initiation of heparin drip, otherwise med optimized. Alene Mires to come evaluate patient. Will continue to monitor.

## 2014-05-07 NOTE — [facility] Oncology Navigator Note (Unsigned)
Spoke with patient at [facility] today.  I introduced myself and my role.  I set her up to be seen at thoracic clinic on 05/10/14.  She verbalized understanding of appt time and place.

## 2014-05-07 NOTE — Telephone Encounter (Signed)
Called patient with appt time for thoracic clinic on 05/10/14 arrive at 2:15.  She verbalized understanding of appt time and place.

## 2014-05-07 NOTE — Progress Notes (Signed)
Inpatient Diabetes Program Recommendations  AACE/ADA: New Consensus Statement on Inpatient Glycemic Control (2013)  Target Ranges:  Prepandial:   less than 140 mg/dL      Peak postprandial:   less than 180 mg/dL (1-2 hours)      Critically ill patients:  140 - 180 mg/dL   Reason for Assessment:   Results for Tonya Valdez, Tonya Valdez (MRN [id number]) as of 05/07/2014 10:06  Ref. Range 05/07/2014 08:58  Glucose-Capillary Latest Range: 70-99 mg/dL 275 (H)  Results for Tonya Valdez, Tonya Valdez (MRN [id number]) as of 05/07/2014 10:06  Ref. Range 05/02/2014 15:05  Hemoglobin A1C Latest Range: 4.6-6.5 % 7.4 (H)   Diabetes history: Type 2 diabetes Outpatient Diabetes medications: Metformin 500 mg bid Current orders for Inpatient glycemic control:  Novolog sensitive tid with meals and HS  Note that patient received Prednisone 60 mg overnight which likely has increased CBG's. Will follow.    Thanks, Adah Perl, RN, BC-ADM Inpatient Diabetes Coordinator Pager [phone]

## 2014-05-07 NOTE — H&P (Signed)
[facility] Admission History and Physical       [name] ENI:[id number] DOB: 03/06/1940 DOA: 05/06/2014  Referring physician: EDP PCP: Cathlean Cower, MD  Specialists:   Chief Complaint: Chest Tightness and SOB  HPI: Tonya Valdez is a 74 y.o. female with a history of CAD, s/P CABG x 3, Chronic Systolic CHF, COPD, HTN, and Hyperlipidemia who presents to the ED with complaints of intermittent chest tightness and SOB  For the past 2-3 days.   She had Nausea and Diaphoresis associated with the discomfort.    She is a former Smoker, and quit 2 months ago.     Review of Systems:  Constitutional: No Weight Loss, No Weight Gain, Night Sweats, +Diaphoresis, Fevers, Chills, Dizziness, Light Headedness, Fatigue, or Generalized Weakness HEENT: No Headaches, Difficulty Swallowing,Tooth/Dental Problems,Sore Throat,  No Sneezing, Rhinitis, Ear Ache, Nasal Congestion, or Post Nasal Drip,  Cardio-vascular:  +Chest Tightness, No Chest pain, Orthopnea, PND, Edema in Lower Extremities, Anasarca, Dizziness, Palpitations  Resp: No Dyspnea, No DOE, No Productive Cough, No Non-Productive Cough, No Hemoptysis, No Wheezing.    GI: No Heartburn, Indigestion, Abdominal Pain, Nausea, Vomiting, Diarrhea, Constipation, Hematemesis, Hematochezia, Melena, Change in Bowel Habits,  Loss of Appetite  GU: No Dysuria, No Change in Color of Urine, No Urgency or Urinary Frequency, No Flank pain.  Musculoskeletal: No Joint Pain or Swelling, No Decreased Range of Motion, No Back Pain.  Neurologic: No Syncope, No Seizures, Muscle Weakness, Paresthesia, Vision Disturbance or Loss, No Diplopia, No Vertigo, No Difficulty Walking,  Skin: No Rash or Lesions. Psych: No Change in Mood or Affect, No Depression or Anxiety, No Memory loss, No Confusion, or Hallucinations   Past Medical History  Diagnosis Date  . Positive TB test   . Bladder tumor   . COPD (chronic obstructive pulmonary disease)   . Diabetes mellitus TYPE  II--  PER PCP NOTE DR Cathlean Cower    PER PT DOES NOT TAKES METFORMIN PRESCRIBED, SHE WATCHES DIET AND EXERCISES  . Cancer     bladder  . H/O hiatal hernia     s/p  74 yrs old  . Chronic systolic CHF (congestive heart failure) 08/04/2012    Echo (08/17/12): EF 30%, diffuse HK, mild MR, mild BAE, PASP.;  Echo (01/2013): EF 40-45%, inferior posterior HK, Gr 2 DD, mild MR, mild LAE  . Ischemic cardiomyopathy   . CAD (coronary artery disease)     a. LHC (10/07/12):  Ostial LM 60%, distal LM 40-50%, oLAD 40-50%, mLAD 70-80%, pCFX subtotally occluded, mCFX 80-90%, oRCA occluded, ostial L subclavian 40%, EF 25-30%, abdominal aorta diffusely diseased with severe ulcerated plaquing (right iliac ostial 95%; left renal long 60-70%, right okay). CO 2.1, CI 1.5 => s/p CABG (L-LAD, S-OM, S-PDA) 10/2012 with Dr. Cyndia Bent   . Bilateral carotid artery disease 09/26/2012    carotid US (8/14):  R 80-99; L 60-79 => s/p R CEA 10/2012 (Dr. Donnetta Hutching)  . Lung nodule     chest CT 09/2012 => needs repeat by 03/2013  . PAD (peripheral artery disease)   . Hyperlipidemia   . Hyperkalemia     ACEI d/c'd 11/2012  . Bladder cancer 04/26/2013     Past Surgical History  Procedure Laterality Date  . Tonsillectomy  1959  . Hiatal hernia repair  1964  (AGE 22)    CHOLECYSTECTOMY AND APPENDECTOMY  . Left elbow surgery    . Lumbar disc surgery  1993  . Abdominal hysterectomy  1981 (APPROX)  BILATERAL SALPINGO-OOPHORECTOMY WITH EXCEPTION A SMALL PIECE OF OVARY REMAINS  . Transurethral resection of bladder tumor N/A 06/24/2012    Procedure: TRANSURETHRAL RESECTION OF BLADDER TUMOR  WITH GYRUS AND INSTILLATION OF MITOMYCIN C  (TURBT);  Surgeon: Claybon Jabs, MD;  Location: [facility];  Service: Urology;  Laterality: N/A;  . Appendectomy    . Wrist surgery Left   . Cholecystectomy    . Tonsillectomy    . Cardiac catheterization    . Hammer toe surgery Left   . Coronary artery bypass graft N/A 11/14/2012    Procedure:  CORONARY ARTERY BYPASS GRAFTING (CABG) times three using left internal mammary artery and left saphenous vein;  Surgeon: Gaye Pollack, MD;  Location: [facility];  Service: Open Heart Surgery;  Laterality: N/A;  . Intraoperative transesophageal echocardiogram N/A 11/14/2012    Procedure: INTRAOPERATIVE TRANSESOPHAGEAL ECHOCARDIOGRAM;  Surgeon: Gaye Pollack, MD;  Location: [facility];  Service: Open Heart Surgery;  Laterality: N/A;  . Endarterectomy Right 11/14/2012    Procedure: RIGHT ENDARTERECTOMY CAROTID with patch angioplasty.;  Surgeon: Rosetta Posner, MD;  Location: [facility];  Service: Vascular;  Laterality: Right;      Prior to Admission medications   Medication Sig Start Date End Date Taking? Authorizing Provider  aspirin EC 325 MG EC tablet Take 1 tablet (325 mg total) by mouth daily. 11/19/12  Yes Erin R Barrett, PA-C  carvedilol (COREG) 12.5 MG tablet Take 1.5 tablets (18.75 mg total) by mouth 2 (two) times daily with a meal. 11/27/13  Yes Lelon Perla, MD  furosemide (LASIX) 20 MG tablet Take 20 mg by mouth daily.   Yes Historical Provider, MD  glucose blood (ACCU-CHEK SMARTVIEW) test strip 1 each by Other route See admin instructions. Check blood sugar 4 times daily.   Yes Historical Provider, MD  hydrALAZINE (APRESOLINE) 25 MG tablet TAKE 1 TABLET THREE TIMES DAILY 04/03/14  Yes Lelon Perla, MD  Hypromellose (ARTIFICIAL TEARS OP) Place 1 drop into both eyes 4 (four) times daily as needed (for dry eyes).   Yes Historical Provider, MD  isosorbide mononitrate (IMDUR) 30 MG 24 hr tablet TAKE 1 TABLET EVERY DAY 04/03/14  Yes Lelon Perla, MD  metFORMIN (GLUCOPHAGE) 500 MG tablet Take 1 tablet (500 mg total) by mouth 2 (two) times daily with a meal. 11/07/13  Yes Biagio Borg, MD  Multiple Vitamin (MULTIVITAMIN) tablet Take 1 tablet by mouth daily.   Yes Historical Provider, MD  Omega-3 Fatty Acids (FISH OIL) 1000 MG CAPS Take 1,000 mg by mouth daily.    Yes Historical Provider, MD  pravastatin  (PRAVACHOL) 40 MG tablet TAKE 1 TABLET EVERY EVENING 04/03/14  Yes Lelon Perla, MD     No Known Allergies  Social History:  reports that she quit smoking about 17 months ago. Her smoking use included Cigarettes. She has a 10 pack-year smoking history. She has never used smokeless tobacco. She reports that she does not drink alcohol or use illicit drugs.    Family History  Problem Relation Age of Onset  . Cancer Other     colon cancer  . Hypertension Other   . Kidney disease Other   . Diabetes Other   . Heart disease Mother     MI at age 13  . Cervical cancer Mother   . Cancer Mother   . Diabetes Mother   . Hypertension Mother   . Lung cancer Father 75    primary site  lung CA then colon and bone  . Colon cancer Father   . Cancer Father   . Hypertension Father        Physical Exam:  GEN:  Pleasant  Cachectic Elderly 74 y.o. Caucasian female examined and in no acute distress; cooperative with exam Filed Vitals:   05/06/14 2254 05/06/14 2330 05/07/14 0000 05/07/14 0100  BP: 155/52 147/61 163/45 164/67  Pulse: 66 62 65 62  Temp:    98.6 F (37 C)  TempSrc:    Oral  Resp: 22 19 21 20   Height:    5\' 2"  (1.575 m)  Weight:    43.999 kg (97 lb)  SpO2: 97% 98% 95% 95%   Blood pressure 164/67, pulse 62, temperature 98.6 F (37 C), temperature source Oral, resp. rate 20, height 5\' 2"  (1.575 m), weight 43.999 kg (97 lb), SpO2 95 %. PSYCH: SHe is alert and oriented x4; does not appear anxious does not appear depressed; affect is normal HEENT: Normocephalic and Atraumatic, Mucous membranes pink; PERRLA; EOM intact; Fundi:  Benign;  No scleral icterus, Nares: Patent, Oropharynx: Clear, Edentulous with Dentures present,     Neck:  FROM, No Cervical Lymphadenopathy nor Thyromegaly or Carotid Bruit; No JVD; Breasts:: Not examined CHEST WALL: No tenderness CHEST: Normal respiration, clear to auscultation bilaterally HEART: Regular rate and rhythm; no murmurs rubs or gallops BACK:  No kyphosis or scoliosis; No CVA tenderness ABDOMEN: Positive Bowel Sounds, Scaphoid, Soft Non-Tender, No Rebound or Guarding; No Masses, No Organomegaly. Rectal Exam: Not done EXTREMITIES: No Cyanosis, Clubbing, or Edema; No Ulcerations. Genitalia: not examined PULSES: 2+ and symmetric SKIN: Normal hydration no rash or ulceration CNS:  Alert and Oriented x 4, No Focal Deficits Vascular: pulses palpable throughout    Labs on Admission:  Basic Metabolic Panel:  Recent Labs Lab 05/02/14 1505 05/06/14 2138 05/07/14 0615  NA 138 137 137  K 4.2 3.4* 3.6  CL 103 101 102  CO2 32 25 24  GLUCOSE 179* 211* 205*  BUN 19 16 15   CREATININE 0.76 0.73 0.71  CALCIUM 9.4 9.0 8.8   Liver Function Tests:  Recent Labs Lab 05/02/14 1505 05/06/14 2138  AST 19 41*  ALT 12 27  ALKPHOS 85 87  BILITOT 0.3 0.5  PROT 7.0 6.7  ALBUMIN 3.6 3.2*   No results for input(s): LIPASE, AMYLASE in the last 168 hours. No results for input(s): AMMONIA in the last 168 hours. CBC:  Recent Labs Lab 05/06/14 2138 05/07/14 0615  WBC 16.2* 11.7*  HGB 10.8* 10.9*  HCT 32.0* 31.8*  MCV 89.4 89.1  PLT 255 244   Cardiac Enzymes:  Recent Labs Lab 05/07/14 0128 05/07/14 0615  TROPONINI 0.21* 0.22*    BNP (last 3 results)  Recent Labs  05/06/14 2138  BNP 829.1*    ProBNP (last 3 results) No results for input(s): PROBNP in the last 8760 hours.  CBG: No results for input(s): GLUCAP in the last 168 hours.  Radiological Exams on Admission: Ct Angio Chest Pe W/cm &/or Wo Cm  05/06/2014   CLINICAL DATA:  Intermittent dyspnea since Friday.  EXAM: CT ANGIOGRAPHY CHEST WITH CONTRAST  TECHNIQUE: Multidetector CT imaging of the chest was performed using the standard protocol during bolus administration of intravenous contrast. Multiplanar CT image reconstructions and MIPs were obtained to evaluate the vascular anatomy.  CONTRAST:  78mL OMNIPAQUE IOHEXOL 350 MG/ML SOLN  COMPARISON:  05/06/2014  radiograph, chest CT 04/18/2014  FINDINGS: Cardiovascular: There is good opacification of the pulmonary  arteries. There is no pulmonary embolism. The thoracic aorta is normal in caliber and intact.  Lungs: They known right lower lobe carcinoma appears unchanged from 04/18/2014. 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 No acute infiltrate or effusion is evident.  Central airways: Patent  Effusions: None  Lymphadenopathy: None  Esophagus: Unremarkable  Upper abdomen: No significant abnormality  Musculoskeletal: No significant abnormality  Review of the MIP images confirms the above findings.  IMPRESSION: Negative for pulmonary embolism. Unchanged right lower lobe mass. No acute findings are evident.   Electronically Signed   By: Andreas Newport M.D.   On: 05/06/2014 23:29   Dg Chest Portable 1 View  05/06/2014   CLINICAL DATA:  Acute onset of shortness of breath. Known right lower lobe pulmonary nodules. Initial encounter.  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 It is again seen. Minimal bibasilar opacities likely reflect atelectasis. Mild scarring is noted at the lung apices. The subsolid pulmonary nodule within the right lower lobe is not well characterized on radiograph.  No pleural effusion or pneumothorax is seen. The cardiomediastinal silhouette remains normal in size. The patient is status post median sternotomy. No acute osseous abnormalities are identified.  IMPRESSION: 1. 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 2. Minimal bibasilar opacities likely reflect atelectasis; mild scarring at the lung apices. Additional known subsolid pulmonary nodule in the right lower lobe is not well characterized on radiograph.   Electronically Signed   By: Garald Balding M.D.   On:  05/06/2014 21:54     EKG: Independently reviewed. Normal Sinus Rhythm rate = 78 No acute S-T Changes   Assessment/Plan:   74 y.o. female with  Active Problems:   1.    ACS (acute coronary syndrome)/ Chest pain, with + Troponin, NSTEMI vs Demand Ischemia   Telemetry Monitoring   Nitropaste, O2, ASA   IV Heparin   Add Plavix Rx   Continue Carvedilol,      2.    CAD (coronary artery disease)   On Carvedilol, ASA, Imdur and Pravastatin Rx     3.    Chronic systolic CHF (congestive heart failure)   Continue Lasix, Carvedilol, and      4.    COPD (chronic obstructive pulmonary disease)   DuoNebs PRN     5.    Diabetes   SSI coverage PRN   Hold Metformin Rx     6.    Essential hypertension   Monitor BPs   PRN IV Hydralazine   Continue Hydralazine, Carvedilol, and      7.    Shortness of breath- due to #1, and #3, and # 4, and ? #8   NCO2 PRN     8.    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    Bladder cancer-  Had Bleomycin rx followed by BCG Injections   Followed by Urology     10.   DVT Prophylaxis        IV Heparin     Code Status:     FULL CODE      Family Communication:   No Family Present   4 Disposition Plan:   Observation Status  Time spent:  [facility] Pager [phone]   If [place] Please Contact the Day Rounding Team MD for [facility]  If 7PM-7AM, Please Contact Night-Floor Coverage  [email or web address] Password [email or web address] 05/07/2014, 7:52 AM     ADDENDUM:   Patient was seen and examined on 05/07/2014

## 2014-05-07 NOTE — Telephone Encounter (Signed)
Noted patient in the hospital, unable to reach at this time.

## 2014-05-08 ENCOUNTER — Other Ambulatory Visit: Payer: Self-pay | Admitting: Physician Assistant

## 2014-05-08 ENCOUNTER — Other Ambulatory Visit: Payer: Self-pay | Admitting: *Deleted

## 2014-05-08 DIAGNOSIS — R002 Palpitations: Secondary | ICD-10-CM

## 2014-05-08 DIAGNOSIS — R0600 Dyspnea, unspecified: Secondary | ICD-10-CM

## 2014-05-08 DIAGNOSIS — R079 Chest pain, unspecified: Secondary | ICD-10-CM

## 2014-05-08 DIAGNOSIS — C3492 Malignant neoplasm of unspecified part of left bronchus or lung: Secondary | ICD-10-CM

## 2014-05-08 LAB — CBC
HCT: 30.5 % — ABNORMAL LOW (ref 36.0–46.0)
Hemoglobin: 10.3 g/dL — ABNORMAL LOW (ref 12.0–15.0)
MCH: 30.1 pg (ref 26.0–34.0)
MCHC: 33.8 g/dL (ref 30.0–36.0)
MCV: 89.2 fL (ref 78.0–100.0)
Platelets: 246 10*3/uL (ref 150–400)
RBC: 3.42 MIL/uL — ABNORMAL LOW (ref 3.87–5.11)
RDW: 13.8 % (ref 11.5–15.5)
WBC: 12.7 10*3/uL — AB (ref 4.0–10.5)

## 2014-05-08 LAB — GLUCOSE, CAPILLARY
Glucose-Capillary: 138 mg/dL — ABNORMAL HIGH (ref 70–99)
Glucose-Capillary: 162 mg/dL — ABNORMAL HIGH (ref 70–99)

## 2014-05-08 LAB — HEPARIN LEVEL (UNFRACTIONATED)
Heparin Unfractionated: <0.1 IU/mL — ABNORMAL LOW (ref 0.30–0.70)
Heparin Unfractionated: 0.31 IU/mL (ref 0.30–0.70)

## 2014-05-08 LAB — BASIC METABOLIC PANEL
ANION GAP: 7 (ref 5–15)
BUN: 16 mg/dL (ref 6–23)
CO2: 27 mmol/L (ref 19–32)
CREATININE: 0.79 mg/dL (ref 0.50–1.10)
Calcium: 8.5 mg/dL (ref 8.4–10.5)
Chloride: 105 mmol/L (ref 96–112)
GFR calc Af Amer: >90 mL/min (ref >90)
GFR calc non Af Amer: 80 mL/min — ABNORMAL LOW (ref >90)
GLUCOSE: 120 mg/dL — AB (ref 70–99)
POTASSIUM: 3.4 mmol/L — AB (ref 3.5–5.1)
Sodium: 139 mmol/L (ref 135–145)

## 2014-05-08 MED ORDER — HEPARIN BOLUS VIA INFUSION
1000.0000 [IU] | Freq: Once | INTRAVENOUS | Status: AC
  Administered 2014-05-08: 1000 [IU] via INTRAVENOUS
  Filled 2014-05-08: qty 1000

## 2014-05-08 MED ORDER — POTASSIUM CHLORIDE CRYS ER 20 MEQ PO TBCR
40.0000 meq | EXTENDED_RELEASE_TABLET | Freq: Once | ORAL | Status: AC
  Administered 2014-05-08: 40 meq via ORAL
  Filled 2014-05-08: qty 2

## 2014-05-08 NOTE — Progress Notes (Signed)
05/08/2014 3:01 PM Discharge AVS meds taken today and those due this evening reviewed.  Follow-up appointments and when to call md reviewed.  D/C IV and TELE.  Questions and concerns addressed.   D/C home per orders. [place]

## 2014-05-08 NOTE — Discharge Instructions (Signed)

## 2014-05-08 NOTE — Progress Notes (Signed)
  Echocardiogram 2D Echocardiogram has been performed.  Tonya Valdez FRANCES 05/08/2014, 10:36 AM

## 2014-05-08 NOTE — Discharge Summary (Signed)
[facility]  Physician Discharge Summary   Patient ID: Tonya Valdez MRN: [id number] DOB/AGE: 1940-12-02 74 y.o.  Admit date: 05/06/2014 Discharge date: 05/08/2014  PCP: Cathlean Cower, MD  DISCHARGE DIAGNOSES:  Active Problems:   COPD (chronic obstructive pulmonary disease)   Diabetes   CAD (coronary artery disease)   Bladder cancer   Essential hypertension   ACS (acute coronary syndrome)   Shortness of breath   Chronic systolic CHF (congestive heart failure)   Non-small cell cancer of left lung   Chest pain   RECOMMENDATIONS FOR OUTPATIENT FOLLOW UP: 1. Follow-up with cardiology. She has an appointment 2. 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 She had minimally elevated troponins. She was admitted for further management.  Consultations:  Cardiology  Procedures: 2-D echocardiogram Study Conclusions - Left ventricle: The cavity size was normal. There was mildconcentric hypertrophy. Systolic function was normal. Theestimated ejection fraction was in the range of 55% to 60%. Wallmotion was normal; there were no regional wall motionabnormalities. Doppler parameters are consistent with pseudonormal left ventricular relaxation (grade 2 diastolicdysfunction). The E/A ratio is >1.5. The E/e&' ratio is >15,suggesting elevated LV filling pressure. - Aortic valve: Trileaflet. Sclerosis without stenosis. There wasno regurgitation. - Mitral valve: Calcified annulus. Mildly thickened leaflets.There was mild regurgitation. - Left atrium:  Moderately dilated at 40 ml/m2. - Tricuspid valve: There was mild regurgitation. - Pulmonary arteries: PA peak pressure: 31 mm Hg (S). - Inferior vena cava: The vessel was normal in size. Therespirophasic diameter changes were in the normal range (>= 50%),consistent with normal central venous pressure. Impressions: - Compared to the prior echo in 2014, the EF has improved, howeverthere is Stage 2 diastolic dysfunction with elevated LV fillingpressure.   HOSPITAL COURSE:   Patient is admitted to the hospital for further management of her chest pain. Her troponins were mildly elevated. She underwent a CT scan of her lungs, which was negative for PE. She was seen by cardiology. She was placed on heparin infusion. Subsequently, her chest pain subsided. Outpatient stress test was recommended by cardiology. Echocardiogram was also recommended, which shows improvement in her ejection fraction. She has a close follow-up appointment with her cardiologist. She was cleared for discharge today. She should continue with her home medications.  She has been recently diagnosed with non-small cell lung cancer. She is apparently not a candidate for surgery. Plan is for radiation treatment which was started in the near future.  Her other medical issues including COPD, essential hypertension, her all stable. She also has history of diabetes. She's been asked to hold metformin until tomorrow morning due to contrast that she received with the CT scan at the time of admission.  Patient was very keen on going home today. Once the echocardiogram results were available it was felt that she was stable for discharge. Patient was subsequently discharged.  PERTINENT LABS:  The results of significant diagnostics from this hospitalization (including imaging, microbiology, ancillary and laboratory) are listed below for reference.     Labs: Basic Metabolic Panel:  Recent Labs Lab 05/02/14 1505 05/06/14 2138  05/07/14 0615  05/08/14 0451  NA 138 137 137 139  K 4.2 3.4* 3.6 3.4*  CL 103 101 102 105  CO2 32 25 24 27   GLUCOSE 179* 211* 205* 120*  BUN 19 16 15 16   CREATININE 0.76 0.73 0.71 0.79  CALCIUM 9.4 9.0 8.8 8.5   Liver Function Tests:  Recent Labs Lab 05/02/14 1505 05/06/14 2138  AST 19 41*  ALT 12 27  ALKPHOS 85 87  BILITOT 0.3 0.5  PROT 7.0 6.7  ALBUMIN 3.6 3.2*   CBC:  Recent Labs Lab 05/06/14 2138 05/07/14 0615 05/08/14 0451  WBC 16.2* 11.7* 12.7*  HGB 10.8* 10.9* 10.3*  HCT 32.0* 31.8* 30.5*  MCV 89.4 89.1 89.2  PLT 255 244 246   Cardiac Enzymes:  Recent Labs Lab 05/07/14 0128 05/07/14 0615 05/07/14 1303  TROPONINI 0.21* 0.22* 0.18*   BNP: BNP (last 3 results)  Recent Labs  05/06/14 2138  BNP 829.1*    CBG:  Recent Labs Lab 05/07/14 1111 05/07/14 1652 05/07/14 2059 05/08/14 0605 05/08/14 1124  GLUCAP 162* 110* 157* 138* 162*     IMAGING STUDIES Ct Angio Chest Pe W/cm &/or Wo Cm  05/06/2014   CLINICAL DATA:  Intermittent dyspnea since Friday.  EXAM: CT ANGIOGRAPHY CHEST WITH CONTRAST  TECHNIQUE: Multidetector CT imaging of the chest was performed using the standard protocol during bolus administration of intravenous contrast. Multiplanar CT image reconstructions and MIPs were obtained to evaluate the vascular anatomy.  CONTRAST:  66mL OMNIPAQUE IOHEXOL 350 MG/ML SOLN  COMPARISON:  05/06/2014 radiograph, chest CT 04/18/2014  FINDINGS: Cardiovascular: There is good opacification of the pulmonary arteries. There is no pulmonary embolism. The thoracic aorta is normal in caliber and intact.  Lungs: They known right lower lobe carcinoma appears unchanged from 04/18/2014. 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 No acute infiltrate or effusion is evident.  Central airways: Patent  Effusions: None  Lymphadenopathy: None  Esophagus: Unremarkable  Upper abdomen: No significant abnormality  Musculoskeletal: No significant abnormality  Review of the  MIP images confirms the above findings.  IMPRESSION: Negative for pulmonary embolism. Unchanged right lower lobe mass. No acute findings are evident.   Electronically Signed   By: Andreas Newport M.D.   On: 05/06/2014 23:29   Dg Chest Portable 1 View  05/06/2014   CLINICAL DATA:  Acute onset of shortness of breath. Known right lower lobe pulmonary nodules. Initial encounter.  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 It is again seen. Minimal bibasilar opacities likely reflect atelectasis. Mild scarring is noted at the lung apices. The subsolid pulmonary nodule within the right lower lobe is not well characterized on radiograph.  No pleural effusion or pneumothorax is seen. The cardiomediastinal silhouette remains normal in size. The patient is status post median sternotomy. No acute osseous abnormalities are identified.  IMPRESSION: 1. 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 2. Minimal bibasilar opacities likely reflect atelectasis; mild scarring at the lung apices. Additional known subsolid pulmonary nodule in the right lower lobe is not well characterized on radiograph.   Electronically Signed   By: Garald Balding M.D.   On: 05/06/2014 21:54    DISCHARGE EXAMINATION: Filed Vitals:   05/07/14 2004 05/08/14 0410 05/08/14 1055 05/08/14 1300  BP: 119/47 128/59 167/57 140/48  Pulse: 61 59  50  Temp: 98.8 F (37.1 C) 97.9 F (36.6 C)    TempSrc: Oral Oral    Resp: 18 18  18   Height:      Weight:      SpO2: 97% 98%  97%   General appearance: alert, cooperative, appears stated age and no distress Resp: Decreased air entry at the bases. No wheezing. Cardio: regular rate and rhythm, S1, S2 normal GI: soft, non-tender; bowel sounds normal; no masses,  no  organomegaly Extremities: extremities normal, atraumatic, no cyanosis or edema Neurologic: Alert and oriented X 3, normal strength and tone. Normal symmetric reflexes. Normal coordination and gait  DISPOSITION: 35 mins  Discharge Instructions    Call MD for:  difficulty breathing, headache or visual disturbances    Complete by:  As directed      Call MD for:  extreme fatigue    Complete by:  As directed      Call MD for:  persistant dizziness or light-headedness    Complete by:  As directed      Call MD for:  persistant nausea and vomiting    Complete by:  As directed      Call MD for:  severe uncontrolled pain    Complete by:  As directed      Diet - low sodium heart healthy    Complete by:  As directed      Diet Carb Modified    Complete by:  As directed      Discharge instructions    Complete by:  As directed   Resume Metformin 05/09/14     Increase activity slowly    Complete by:  As directed            ALLERGIES: No Known Allergies   Discharge Medication List as of 05/08/2014  2:15 PM    CONTINUE these medications which have NOT CHANGED   Details  aspirin EC 325 MG EC tablet Take 1 tablet (325 mg total) by mouth daily., Starting 11/19/2012, Until Discontinued, OTC    carvedilol (COREG) 12.5 MG tablet Take 1.5 tablets (18.75 mg total) by mouth 2 (two) times daily with a meal., Starting 11/27/2013, Until Discontinued, Normal    furosemide (LASIX) 20 MG tablet Take 20 mg by mouth daily., Until Discontinued, Historical Med    glucose blood (ACCU-CHEK SMARTVIEW) test strip 1 each by Other route See admin instructions. Check blood sugar 4 times daily., Historical Med    hydrALAZINE (APRESOLINE) 25 MG tablet TAKE 1 TABLET THREE TIMES DAILY, Normal    Hypromellose (ARTIFICIAL TEARS OP) Place 1 drop into both eyes 4 (four) times daily as needed (for dry eyes)., Until Discontinued, Historical Med    isosorbide mononitrate (IMDUR) 30 MG 24 hr tablet TAKE 1 TABLET EVERY DAY,  Normal    metFORMIN (GLUCOPHAGE) 500 MG tablet Take 1 tablet (500 mg total) by mouth 2 (two) times daily with a meal., Starting 11/07/2013, Until Discontinued, Fax    Multiple Vitamin (MULTIVITAMIN) tablet Take 1 tablet by mouth daily., Until Discontinued, Historical Med    Omega-3 Fatty Acids (FISH OIL) 1000 MG CAPS Take 1,000 mg by mouth daily. , Until Discontinued, Historical Med    pravastatin (PRAVACHOL) 40 MG tablet TAKE 1 TABLET EVERY EVENING, Normal       Follow-up Information    Follow up with Cathlean Cower, MD.   Specialties:  Internal Medicine, Radiology   Why:  As needed   Contact information:   [place]  [place] [address] [phone]       Follow up with Kirk Ruths, MD On 05/29/2014.   Specialty:  Cardiology   Why:  11:30am   Contact information:   [place] [address] [phone]       Follow up with [facility] On 05/09/2014.   Specialty:  Cardiology   Why:  10:00am. Set up 2 week event monitor   Contact information:   [address] [place] [phone]      Follow up with [facility].   Why:  Office will contact you to set up outpatient stress test prior to your followup with Dr. Stanford Breed. Please give [place] a call if you do not hear from [place] in 2 business days   Contact information:   [address] [place] [address] [phone]      TOTAL DISCHARGE TIME: 35 mins  [facility] Pager [phone]  05/08/2014, 4:35 PM

## 2014-05-08 NOTE — Progress Notes (Signed)
ANTICOAGULATION CONSULT NOTE - F/u Consult  Pharmacy Consult for heparin Indication: chest pain/ACS  No Known Allergies  Patient Measurements: Height: 5\' 2"  (157.5 cm) Weight: 97 lb (43.999 kg) IBW/kg (Calculated) : 50.1  Vital Signs: Temp: 97.9 F (36.6 C) (03/15 0410) Temp Source: Oral (03/15 0410) BP: 167/57 mmHg (03/15 1055) Pulse Rate: 59 (03/15 0410)  Labs:  Recent Labs  05/06/14 2138 05/07/14 0128 05/07/14 0615 05/07/14 1303 05/07/14 2237 05/08/14 0451 05/08/14 0927  HGB 10.8*  --  10.9*  --   --  10.3*  --   HCT 32.0*  --  31.8*  --   --  30.5*  --   PLT 255  --  244  --   --  246  --   HEPARINUNFRC  --   --   --  <0.10* <0.10*  --  0.31  CREATININE 0.73  --  0.71  --   --  0.79  --   TROPONINI  --  0.21* 0.22* 0.18*  --   --   --     Estimated Creatinine Clearance: 42.9 mL/min (by C-G formula based on Cr of 0.79).   Medical History: Past Medical History  Diagnosis Date  . Positive TB test   . Bladder tumor   . COPD (chronic obstructive pulmonary disease)   . Diabetes mellitus TYPE II--  PER PCP NOTE DR Cathlean Cower    PER PT DOES NOT TAKES METFORMIN PRESCRIBED, SHE WATCHES DIET AND EXERCISES  . Cancer     bladder  . H/O hiatal hernia     s/p  74 yrs old  . Chronic systolic CHF (congestive heart failure) 08/04/2012    Echo (08/17/12): EF 30%, diffuse HK, mild MR, mild BAE, PASP.;  Echo (01/2013): EF 40-45%, inferior posterior HK, Gr 2 DD, mild MR, mild LAE  . Ischemic cardiomyopathy   . CAD (coronary artery disease)     a. LHC (10/07/12):  Ostial LM 60%, distal LM 40-50%, oLAD 40-50%, mLAD 70-80%, pCFX subtotally occluded, mCFX 80-90%, oRCA occluded, ostial L subclavian 40%, EF 25-30%, abdominal aorta diffusely diseased with severe ulcerated plaquing (right iliac ostial 95%; left renal long 60-70%, right okay). CO 2.1, CI 1.5 => s/p CABG (L-LAD, S-OM, S-PDA) 10/2012 with Dr. Cyndia Bent   . Bilateral carotid artery disease 09/26/2012    carotid US (8/14):  R  80-99; L 60-79 => s/p R CEA 10/2012 (Dr. Donnetta Hutching)  . Lung nodule     chest CT 09/2012 => needs repeat by 03/2013  . PAD (peripheral artery disease)   . Hyperlipidemia   . Hyperkalemia     ACEI d/c'd 11/2012  . Bladder cancer 04/26/2013    Medications:  Prescriptions prior to admission  Medication Sig Dispense Refill Last Dose  . aspirin EC 325 MG EC tablet Take 1 tablet (325 mg total) by mouth daily. 30 tablet 0 05/06/2014 at Unknown time  . carvedilol (COREG) 12.5 MG tablet Take 1.5 tablets (18.75 mg total) by mouth 2 (two) times daily with a meal. 270 tablet 3 05/06/2014 at 0500  . furosemide (LASIX) 20 MG tablet Take 20 mg by mouth daily.   05/06/2014 at Unknown time  . glucose blood (ACCU-CHEK SMARTVIEW) test strip 1 each by Other route See admin instructions. Check blood sugar 4 times daily.   Taking  . hydrALAZINE (APRESOLINE) 25 MG tablet TAKE 1 TABLET THREE TIMES DAILY 270 tablet 3 05/06/2014 at Unknown time  . Hypromellose (ARTIFICIAL TEARS OP) Place 1 drop into  both eyes 4 (four) times daily as needed (for dry eyes).   05/06/2014 at Unknown time  . isosorbide mononitrate (IMDUR) 30 MG 24 hr tablet TAKE 1 TABLET EVERY DAY 90 tablet 3 05/06/2014 at Unknown time  . metFORMIN (GLUCOPHAGE) 500 MG tablet Take 1 tablet (500 mg total) by mouth 2 (two) times daily with a meal. 180 tablet 3 05/06/2014 at Unknown time  . Multiple Vitamin (MULTIVITAMIN) tablet Take 1 tablet by mouth daily.   05/06/2014 at Unknown time  . Omega-3 Fatty Acids (FISH OIL) 1000 MG CAPS Take 1,000 mg by mouth daily.    05/06/2014 at Unknown time  . pravastatin (PRAVACHOL) 40 MG tablet TAKE 1 TABLET EVERY EVENING 90 tablet 3 05/06/2014 at Unknown time   Scheduled:  . aspirin EC  325 mg Oral Daily  . carvedilol  18.75 mg Oral BID WC  . furosemide  20 mg Oral Daily  . hydrALAZINE  25 mg Oral TID  . insulin aspart  0-5 Units Subcutaneous QHS  . insulin aspart  0-9 Units Subcutaneous TID WC  . multivitamin with minerals  1 tablet  Oral Daily  . omega-3 acid ethyl esters  1 g Oral Daily  . pravastatin  40 mg Oral QPM  . sodium chloride  3 mL Intravenous Q12H  . sodium chloride  3 mL Intravenous Q12H    Assessment: 74yo female c/o SOB and chest pressure w/ elevated troponin on IV heparin at 850 units/hr. F/u HL is therapeutic at 0.31 but on lower end of goal range. H/H low but stable. Plt wnl. No bleeding noted   Goal of Therapy:  Heparin level 0.3-0.7 units/ml Monitor platelets by anticoagulation protocol: Yes   Plan:  Increase heparin gtt slightly to 900 units/hr F/u 8 hr confirmatory HL Monitor daily heparin levels and CBC. Likely discharge today after ECHO results   Albertina Parr, PharmD., BCPS Clinical Pharmacist Pager [phone]

## 2014-05-08 NOTE — Progress Notes (Signed)
ANTICOAGULATION CONSULT NOTE - Follow Up Consult  Pharmacy Consult for heparin Indication: chest pain/ACS  Labs:  Recent Labs  05/06/14 2138 05/07/14 0128 05/07/14 0615 05/07/14 1303 05/07/14 2237  HGB 10.8*  --  10.9*  --   --   HCT 32.0*  --  31.8*  --   --   PLT 255  --  244  --   --   HEPARINUNFRC  --   --   --  <0.10* <0.10*  CREATININE 0.73  --  0.71  --   --   TROPONINI  --  0.21* 0.22* 0.18*  --     Assessment: 73yo female remains undetectable on heparin after rate change.  Goal of Therapy:  Heparin level 0.3-0.7 units/ml   Plan:  Will rebolus with heparin 1000 units and increase gtt by 4 units/kg/hr to 850 units/hr and check level in [place], PharmD, BCPS  05/08/2014,12:43 AM

## 2014-05-08 NOTE — Progress Notes (Signed)
Patient Name: Tonya Valdez Date of Encounter: 05/08/2014  Primary Cardiologist: Dr. Stanford Breed   Active Problems:   COPD (chronic obstructive pulmonary disease)   Diabetes   CAD (coronary artery disease)   Bladder cancer   Essential hypertension   ACS (acute coronary syndrome)   Shortness of breath   Chronic systolic CHF (congestive heart failure)   Non-small cell cancer of left lung   Chest pain    SUBJECTIVE  Retired Therapist, sports from [employer] Denies any recurrent CP or SOB. Somewhat angry that people thought out her 18 month CABG graft might be closing up and thought about cath, stating that this is very unlikely to be her heart and she wish to leave. Also wish to have echo done as outpatient as well, does not want to sit here all day waiting for echo.   CURRENT MEDS . aspirin EC  325 mg Oral Daily  . carvedilol  18.75 mg Oral BID WC  . furosemide  20 mg Oral Daily  . hydrALAZINE  25 mg Oral TID  . insulin aspart  0-5 Units Subcutaneous QHS  . insulin aspart  0-9 Units Subcutaneous TID WC  . multivitamin with minerals  1 tablet Oral Daily  . nitroGLYCERIN  1 inch Topical 4 times per day  . omega-3 acid ethyl esters  1 g Oral Daily  . pravastatin  40 mg Oral QPM  . sodium chloride  3 mL Intravenous Q12H  . sodium chloride  3 mL Intravenous Q12H    OBJECTIVE  Filed Vitals:   05/07/14 1712 05/07/14 1720 05/07/14 2004 05/08/14 0410  BP: 140/53  119/47 128/59  Pulse:  67 61 59  Temp:   98.8 F (37.1 C) 97.9 F (36.6 C)  TempSrc:   Oral Oral  Resp:   18 18  Height:      Weight:      SpO2:   97% 98%    Intake/Output Summary (Last 24 hours) at 05/08/14 0747 Last data filed at 05/07/14 1700  Gross per 24 hour  Intake    600 ml  Output      2 ml  Net    598 ml   Filed Weights   05/07/14 0100  Weight: 97 lb (43.999 kg)    PHYSICAL EXAM  General: Pleasant, NAD. Neuro: Alert and oriented X 3. Moves all extremities spontaneously. Psych: Normal affect. HEENT:   Normal  Neck: Supple without bruits or JVD. Lungs:  Resp regular and unlabored. R basilar rale. Heart: RRR no s3, s4, or murmurs. Abdomen: Soft, non-tender, non-distended, BS + x 4.  Extremities: No clubbing, cyanosis or edema. DP/PT/Radials 2+ and equal bilaterally.  Accessory Clinical Findings  CBC  Recent Labs  05/07/14 0615 05/08/14 0451  WBC 11.7* 12.7*  HGB 10.9* 10.3*  HCT 31.8* 30.5*  MCV 89.1 89.2  PLT 244 211   Basic Metabolic Panel  Recent Labs  05/07/14 0615 05/08/14 0451  NA 137 139  K 3.6 3.4*  CL 102 105  CO2 24 27  GLUCOSE 205* 120*  BUN 15 16  CREATININE 0.71 0.79  CALCIUM 8.8 8.5   Liver Function Tests  Recent Labs  05/06/14 2138  AST 41*  ALT 27  ALKPHOS 87  BILITOT 0.5  PROT 6.7  ALBUMIN 3.2*   Cardiac Enzymes  Recent Labs  05/07/14 0128 05/07/14 0615 05/07/14 1303  TROPONINI 0.21* 0.22* 0.18*   D-Dimer  Recent Labs  05/06/14 2138  DDIMER 3.35*    TELE  NSR with HR 50s, occasional PVCs and trigeminy    ECG  No new EKG  Echocardiogram 01/2013  LV EF: 40% -  45%  ------------------------------------------------------------ Indications:   CAD of native vessels 414.01.  ------------------------------------------------------------ History:  PMH: Acquired from the patient and from the patient's chart. PMH: CAD, CHF. ischemic Cardiomyopathy. Cerebrovascular Disease. COPD. Bladder Cancer.  ------------------------------------------------------------ Study Conclusions  - Left ventricle: The cavity size was normal. Systolic function was mildly to moderately reduced. The estimated ejection fraction was in the range of 40% to 45%. There is severe hypokinesis of the inferior posterior myocardium. Features are consistent with a pseudonormal left ventricular filling pattern, with concomitant abnormal relaxation and increased filling pressure (grade 2 diastolic dysfunction). Doppler parameters are  consistent with high ventricular filling pressure. - Mitral valve: Mild regurgitation. - Left atrium: The atrium was mildly dilated. Impressions:  - Compared to 08/17/12, LV function has improved.    Radiology/Studies  Dg Chest 1 View  04/26/2014   CLINICAL DATA:  Status post right lung biopsy.  EXAM: CHEST  1 VIEW  COMPARISON:  December 27, 2013.  FINDINGS: The heart size and mediastinal contours are within normal limits. No pneumothorax is seen status post biopsy of right lower lobe mass. Status post coronary artery bypass graft. No significant pleural effusion is noted. The visualized skeletal structures are unremarkable.  IMPRESSION: No pneumothorax seen status post biopsy of right lower lobe lung mass.   Electronically Signed   By: Marijo Conception, M.D.   On: 04/26/2014 13:59   Ct Angio Chest Pe W/cm &/or Wo Cm  05/06/2014   CLINICAL DATA:  Intermittent dyspnea since Friday.  EXAM: CT ANGIOGRAPHY CHEST WITH CONTRAST  TECHNIQUE: Multidetector CT imaging of the chest was performed using the standard protocol during bolus administration of intravenous contrast. Multiplanar CT image reconstructions and MIPs were obtained to evaluate the vascular anatomy.  CONTRAST:  51mL OMNIPAQUE IOHEXOL 350 MG/ML SOLN  COMPARISON:  05/06/2014 radiograph, chest CT 04/18/2014  FINDINGS: Cardiovascular: There is good opacification of the pulmonary arteries. There is no pulmonary embolism. The thoracic aorta is normal in caliber and intact.  Lungs: They known right lower lobe carcinoma appears unchanged from 04/18/2014. 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 No acute infiltrate or effusion is evident.  Central airways: Patent  Effusions: None  Lymphadenopathy: None  Esophagus: Unremarkable  Upper abdomen: No significant abnormality  Musculoskeletal: No significant abnormality  Review of the MIP images confirms the above findings.  IMPRESSION: Negative for pulmonary embolism. Unchanged right lower lobe mass. No  acute findings are evident.   Electronically Signed   By: Andreas Newport M.D.   On: 05/06/2014 23:29   Ct Biopsy  04/26/2014   CLINICAL DATA:  Enlarging right lower lobe lesion. Lesion is hypermetabolic on PET-CT.  EXAM: CT-GUIDED BIOPSY OF RIGHT LOWER LOBE NODULE  Physician: Stephan Minister. Henn, MD  MEDICATIONS: 1 mg Versed, 25 mcg fentanyl. A radiology nurse monitored the patient for moderate sedation.  ANESTHESIA/SEDATION: Sedation time: 25 minutes  PROCEDURE: The procedure was explained to the patient. The risks and benefits of the procedure were discussed and the patient's questions were addressed. Specifically, the risks of pneumothorax and hemoptysis were discussed with the patient. Informed consent was obtained from the patient.  The patient was placed prone. CT images through the lower chest were obtained. The lesion along the posterior right lower lobe was identified. The right side of the back was prepped and draped in sterile fashion. 1% lidocaine used for  local anesthetic. A 17 gauge needle directed into the pleural-based lesion with CT guidance. Needle position was confirmed within the lesion. Three core biopsies were obtained with an 18 gauge core device. The 17 gauge needle was removed using a [employer] tract plug. Bandage placed over the puncture site.  FINDINGS: There is a pleural-based lesion in the posterior right lower lobe roughly measuring 2.5 cm. Needle position confirmed within the lesion. No significant pneumothorax following the core biopsies.  COMPLICATIONS: None  IMPRESSION: CT-guided core biopsies of the posterior right lower lobe nodule.   Electronically Signed   By: Markus Daft M.D.   On: 04/26/2014 13:10   Dg Chest Portable 1 View  05/06/2014   CLINICAL DATA:  Acute onset of shortness of breath. Known right lower lobe pulmonary nodules. Initial encounter.  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 It is again seen. Minimal bibasilar opacities likely reflect atelectasis. Mild scarring is noted at the lung apices. The subsolid pulmonary nodule within the right lower lobe is not well characterized on radiograph.  No pleural effusion or pneumothorax is seen. The cardiomediastinal silhouette remains normal in size. The patient is status post median sternotomy. No acute osseous abnormalities are identified.  IMPRESSION: 1. 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 2. Minimal bibasilar opacities likely reflect atelectasis; mild scarring at the lung apices. Additional known subsolid pulmonary nodule in the right lower lobe is not well characterized on radiograph.   Electronically Signed   By: Garald Balding M.D.   On: 05/06/2014 21:54   Ct Chest Nodule Follow Up Low Dose W/o  04/18/2014   CLINICAL DATA:  Followup pulmonary nodule. History of bladder cancer with surgery and chemotherapy. Weight loss for the past 4 years  EXAM: CT CHEST WITHOUT CONTRAST  TECHNIQUE: Multidetector CT imaging of the chest was performed following the standard protocol without IV contrast.  COMPARISON:  12/11/2013  FINDINGS: Mediastinum: The heart size appears normal. There is no pericardial effusion. Previous median sternotomy and CABG procedure. There is calcified atherosclerotic disease involving the thoracic aorta as well as the native Coronary arteries. No mediastinal or hilar adenopathy.  Lungs/Pleura: No pleural effusion identified. Peripheral interstitial reticulation is identified bilaterally. Changes of paraseptal emphysema are also noted which appear upper lobe predominant. Right lower lobe subpleural mass measures 2.2 x 3 cm, image 103/series 3. This is increased in size from 1.7 x 1.2 cm previously. Also in the right lower lobe is a stable 6 mm nodule, image 89/series 3. Calcified  granuloma is identified in the right upper lobe. The sub solid nodule in the right lower lobe is unchanged measuring 2.1 cm, image 75/series 3.  Upper Abdomen: The visualized portions of the liver and spleen are unremarkable. The adrenal glands appear normal.  Musculoskeletal: There is mild multi level spondylosis within the thoracic spine.  IMPRESSION: 1. Increase in size of right lower lobe pulmonary nodule which is worrisome for primary bronchogenic carcinoma. No evidence for hilar or mediastinal adenopathy or distant metastatic disease. 2. Stable 2.1 cm sub solid nodule in the right lower lobe. Continued annual surveillance CT of this nodule is recommended to establish stability for a period of 3 years. This recommendation follows the consensus statement: Recommendations for the Management of Subsolid Pulmonary Nodules Detected at CT: A Statement from  the Fleischner Society as published in Radiology 2013; 266:304-317.   Electronically Signed   By: Kerby Moors M.D.   On: 04/18/2014 14:14    ASSESSMENT AND PLAN  1. Chest pain  - echo pending if no significant change (EF 40-45% in 2014), likely stable for discharge  - will arrange outpatient myoview and event monitor, since her next followup with Dr. Stanford Breed is 4/5, may consider 2 week event monitor  Addendum: patient wish to have echo done as outpatient as well. She wish to leave today.   2. CAD s/p CABG (L-LAD, S-OM, S-PDA) in 10/2012 with Dr. Cyndia Bent  3. Carotid stenosis  - carotid US (8/14): R 80-99; L 60-79 => s/p R CEA in 10/2012 with Dr. Donnetta Hutching   4. chronic systolic HF 5. COPD  6. HTN 7. HLD 8. Bladder cancer 9. recent diagnosis of non-small cell cancer in the left lung.   - per patient, due to location and size, not amenable to surgery, pending radiation  Signed, Woodward Ku Pager: [phone] Patient seen.  She was pleased that the echo has been done.  The results are pending.  She should be able to be discharged today with plans  as above and follow-up with Dr. Stanford Breed.

## 2014-05-09 ENCOUNTER — Ambulatory Visit: Payer: Commercial Managed Care - HMO | Admitting: *Deleted

## 2014-05-09 ENCOUNTER — Telehealth: Payer: Self-pay | Admitting: *Deleted

## 2014-05-09 DIAGNOSIS — R002 Palpitations: Secondary | ICD-10-CM

## 2014-05-09 NOTE — Progress Notes (Signed)
Pt reported for 2 week event monitor post-hospital d/c.

## 2014-05-09 NOTE — Telephone Encounter (Signed)
Pt was on TCM list d/c on 05/07/14 for chest pain, also will f/u with her cardiology on 05/09/14...Johny Chess

## 2014-05-09 NOTE — Patient Instructions (Signed)
Cardiac Event Monitoring A cardiac event monitor is a small recording device used to help detect abnormal heart rhythms (arrhythmias). The monitor is used to record heart rhythm when noticeable symptoms such as the following occur:  Fast heartbeats (palpitations), such as heart racing or fluttering.  Dizziness.  Fainting or light-headedness.  Unexplained weakness. The monitor is wired to two electrodes placed on your chest. Electrodes are flat, sticky disks that attach to your skin. The monitor can be worn for up to 30 days. You will wear the monitor at all times, except when bathing.  HOW TO USE YOUR CARDIAC EVENT MONITOR A technician will prepare your chest for the electrode placement. The technician will show you how to place the electrodes, how to work the monitor, and how to replace the batteries. Take time to practice using the monitor before you leave the office. Make sure you understand how to send the information from the monitor to your health care provider. This requires a telephone with a landline, not a cell phone. You need to:  Wear your monitor at all times, except when you are in water:  Do not get the monitor wet.  Take the monitor off when bathing. Do not swim or use a hot tub with it on.  Keep your skin clean. Do not put body lotion or moisturizer on your chest.  Change the electrodes daily or any time they stop sticking to your skin. You might need to use tape to keep them on.  It is possible that your skin under the electrodes could become irritated. To keep this from happening, try to put the electrodes in slightly different places on your chest. However, they must remain in the area under your left breast and in the upper right section of your chest.  Make sure the monitor is safely clipped to your clothing or in a location close to your body that your health care provider recommends.  Press the button to record when you feel symptoms of heart trouble, such as  dizziness, weakness, light-headedness, palpitations, thumping, shortness of breath, unexplained weakness, or a fluttering or racing heart. The monitor is always on and records what happened slightly before you pressed the button, so do not worry about being too late to get good information.  Keep a diary of your activities, such as walking, doing chores, and taking medicine. It is especially important to note what you were doing when you pushed the button to record your symptoms. This will help your health care provider determine what might be contributing to your symptoms. The information stored in your monitor will be reviewed by your health care provider alongside your diary entries.  Send the recorded information as recommended by your health care provider. It is important to understand that it will take some time for your health care provider to process the results.  Change the batteries as recommended by your health care provider. SEEK IMMEDIATE MEDICAL CARE IF:   You have chest pain.  You have extreme difficulty breathing or shortness of breath.  You develop a very fast heartbeat that persists.  You develop dizziness that does not go away.  You faint or constantly feel you are about to faint. Document Released: 11/19/2007 Document Revised: 06/26/2013 Document Reviewed: 08/08/2012 [employer] Patient Information 2015 [employer]. This information is not intended to replace advice given to you by your health care provider. Make sure you discuss any questions you have with your health care provider.  

## 2014-05-10 ENCOUNTER — Telehealth: Payer: Self-pay | Admitting: Internal Medicine

## 2014-05-10 ENCOUNTER — Ambulatory Visit: Payer: Commercial Managed Care - HMO | Attending: Internal Medicine | Admitting: Physical Therapy

## 2014-05-10 ENCOUNTER — Ambulatory Visit (HOSPITAL_BASED_OUTPATIENT_CLINIC_OR_DEPARTMENT_OTHER): Payer: Commercial Managed Care - HMO | Admitting: Internal Medicine

## 2014-05-10 ENCOUNTER — Ambulatory Visit
Admission: RE | Admit: 2014-05-10 | Discharge: 2014-05-10 | Disposition: A | Discharge status: Home / Self Care | Payer: Commercial Managed Care - HMO | Source: Ambulatory Visit | Attending: Radiation Oncology | Admitting: Radiation Oncology

## 2014-05-10 ENCOUNTER — Encounter: Payer: Self-pay | Admitting: Internal Medicine

## 2014-05-10 VITALS — BP 169/41 | HR 72 | Temp 98.4°F | Resp 18 | Ht 62.0 in | Wt 101.1 lb

## 2014-05-10 VITALS — BP 169/41 | HR 72 | Temp 98.4°F | Resp 18 | Wt 101.1 lb

## 2014-05-10 DIAGNOSIS — C3492 Malignant neoplasm of unspecified part of left bronchus or lung: Secondary | ICD-10-CM | POA: Diagnosis not present

## 2014-05-10 DIAGNOSIS — R0602 Shortness of breath: Secondary | ICD-10-CM | POA: Diagnosis not present

## 2014-05-10 DIAGNOSIS — R5381 Other malaise: Secondary | ICD-10-CM | POA: Insufficient documentation

## 2014-05-10 DIAGNOSIS — C3431 Malignant neoplasm of lower lobe, right bronchus or lung: Secondary | ICD-10-CM

## 2014-05-10 NOTE — Therapy (Signed)
[place], [place], [address] Phone: [phone]   Fax:  [phone]  Physical Therapy Evaluation  Patient Details  Name: Tonya Valdez MRN: [id number] Date of Birth: [birth year] Referring Provider:  Curt Bears, MD  Encounter Date: 05/10/2014      PT End of Session - 05/10/14 1540    Visit Number 1   Number of Visits 1   Date for PT Re-Evaluation 07/09/14   PT Start Time 1505   PT Stop Time 1530   PT Time Calculation (min) 25 min   Activity Tolerance Patient tolerated treatment well   Behavior During Therapy [facility] for tasks assessed/performed      Past Medical History  Diagnosis Date  . Positive TB test   . Bladder tumor   . COPD (chronic obstructive pulmonary disease)   . Diabetes mellitus TYPE II--  PER PCP NOTE DR Cathlean Cower    PER PT DOES NOT TAKES METFORMIN PRESCRIBED, SHE WATCHES DIET AND EXERCISES  . Cancer     bladder  . H/O hiatal hernia     s/p  74 yrs old  . Chronic systolic CHF (congestive heart failure) 08/04/2012    Echo (08/17/12): EF 30%, diffuse HK, mild MR, mild BAE, PASP.;  Echo (01/2013): EF 40-45%, inferior posterior HK, Gr 2 DD, mild MR, mild LAE  . Ischemic cardiomyopathy   . CAD (coronary artery disease)     a. LHC (10/07/12):  Ostial LM 60%, distal LM 40-50%, oLAD 40-50%, mLAD 70-80%, pCFX subtotally occluded, mCFX 80-90%, oRCA occluded, ostial L subclavian 40%, EF 25-30%, abdominal aorta diffusely diseased with severe ulcerated plaquing (right iliac ostial 95%; left renal long 60-70%, right okay). CO 2.1, CI 1.5 => s/p CABG (L-LAD, S-OM, S-PDA) 10/2012 with Dr. Cyndia Bent   . Bilateral carotid artery disease 09/26/2012    carotid US (8/14):  R 80-99; L 60-79 => s/p R CEA 10/2012 (Dr. Donnetta Hutching)  . Lung nodule     chest CT 09/2012 => needs repeat by 03/2013  . PAD (peripheral artery disease)   . Hyperlipidemia   . Hyperkalemia     ACEI d/c'd 11/2012  . Bladder cancer 04/26/2013     Past Surgical History  Procedure Laterality Date  . Tonsillectomy  1959  . Hiatal hernia repair  1964  (AGE 55)    CHOLECYSTECTOMY AND APPENDECTOMY  . Left elbow surgery    . Lumbar disc surgery  1993  . Abdominal hysterectomy  1981 (APPROX)    BILATERAL SALPINGO-OOPHORECTOMY WITH EXCEPTION A SMALL PIECE OF OVARY REMAINS  . Transurethral resection of bladder tumor N/A 06/24/2012    Procedure: TRANSURETHRAL RESECTION OF BLADDER TUMOR  WITH GYRUS AND INSTILLATION OF MITOMYCIN C  (TURBT);  Surgeon: Claybon Jabs, MD;  Location: [facility];  Service: Urology;  Laterality: N/A;  . Appendectomy    . Wrist surgery Left   . Cholecystectomy    . Tonsillectomy    . Cardiac catheterization    . Hammer toe surgery Left   . Coronary artery bypass graft N/A 11/14/2012    Procedure: CORONARY ARTERY BYPASS GRAFTING (CABG) times three using left internal mammary artery and left saphenous vein;  Surgeon: Gaye Pollack, MD; L-LAD, S-OM, S-PDA   . Intraoperative transesophageal echocardiogram N/A 11/14/2012    Procedure: INTRAOPERATIVE TRANSESOPHAGEAL ECHOCARDIOGRAM;  Surgeon: Gaye Pollack, MD;  Location[facility] OR;  Service: Open Heart Surgery;  Laterality: N/A;  . Endarterectomy Right 11/14/2012  Procedure: RIGHT ENDARTERECTOMY CAROTID with patch angioplasty.;  Surgeon: Rosetta Posner, MD;  Location: [place];  Service: Vascular;  Laterality: Right;    There were no vitals filed for this visit.  Visit Diagnosis:  Physical deconditioning - Plan: PT plan of care cert/re-cert  Shortness of breath - Plan: PT plan of care cert/re-cert      Subjective Assessment - 05/10/14 1531    Symptoms recent episode shortness of breath--now being monitored for possible atrial fibrillation or flutter   Pertinent History patient was being worked up for bladder cancer and was found to have a right lower lobe lung nodule; then was followed with serial CT scans.  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  Expected to have chemoradiationor just radiation therapy.  COPD, ex-smoker, CHF, bladder CA s/p resection, CABGx 3 and carotid endarterectomy 2014, DM (type II), PAD, h/o back surgery for 3 ruptured discs resolved those issues years ago.   Currently in Pain? 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  No   Is the patient reluctant to leave their home because of a fear of falling?  No   Home Environment   Living Enviornment Private residence   [place] One level   Prior Function   Level of Independence Independent with basic ADLs;Independent with homemaking with ambulation;Independent with gait   Vocation Retired  Therapist, sports   Observation/Other Assessments   Observations thin older female (approx. 97 lbs.)   Sensation   Light Touch Not tested  denies numbness, tingling   Functional Tests   Functional tests Sit to Stand   Sit to Stand   Comments 9 times in 30 seconds   Posture/Postural Control   Posture/Postural Control Postural limitations   Postural Limitations Forward head  slight, otherwise erect   ROM / Strength   AROM / PROM / Strength AROM   AROM   Overall AROM  Within functional limits for tasks performed   Overall AROM Comments standing trunk AROM assessed   Ambulation/Gait   Ambulation/Gait Yes   Ambulation/Gait Assistance 7: Independent   Balance   Balance Assessed Yes   Dynamic Standing Balance   Dynamic Standing - Comments reaches 12 inches forward in standing                           PT Education - 05/10/14 1539     Education provided Yes   Education Details posture, breathing, walking, energy conservation   Person(s) Educated Patient   Methods Explanation;Handout   Comprehension Verbalized understanding               Lung Clinic Goals - 05/10/14 1546    Patient will be able to verbalize understanding of the benefit of exercise to decrease fatigue.   Status Achieved   Patient will be able to verbalize the importance of posture.   Status Achieved   Patient will be able to demonstrate diaphragmatic breathing for improved  lung function.   Status Achieved   Patient will be able to verbalize understanding of the role of physical therapy to prevent functional decline and who to contact if physical therapy is needed.   Status Achieved             Plan - 2014-05-31 1541    Clinical Impression Statement Patient with active lifestyle, per her report, with recent shortness of breath and slight forward head posture; also is quite thin.   Pt will benefit from skilled therapeutic intervention in order to improve on the following deficits Cardiopulmonary status limiting activity   Rehab Potential Excellent   PT Frequency One time visit   PT Treatment/Interventions Patient/family education   PT Next Visit Plan None at this time   PT Home Exercise Plan see education section   Consulted and Agree with Plan of Care Patient          G-Codes - 2014/05/31 1602    Functional Limitation Mobility: Walking and moving around   Mobility: Walking and Moving Around Current Status [phone]) At least 1 percent but less than 20 percent impaired, limited or restricted   Mobility: Walking and Moving Around Goal Status [phone]) At least 1 percent but less than 20 percent impaired, limited or restricted   Mobility: Walking and Moving Around Discharge Status [phone]) At least 1 percent but less than 20 percent impaired, limited or restricted       Problem List Patient Active Problem List   Diagnosis Date Noted  .  ACS (acute coronary syndrome) 05/07/2014  . Shortness of breath 05/07/2014  . Non-small cell cancer of left lung 05/07/2014  . Chest pain 05/07/2014  . Lung nodule   . Carotid stenosis 12/26/2013  . Essential hypertension 05/01/2013  . Bladder cancer 04/26/2013  . Chronic systolic heart failure 49/67/5916  . CAD (coronary artery disease) 11/10/2012  . Cardiomyopathy, ischemic 11/10/2012  . Peripheral vascular disease 11/10/2012  . Occlusion and stenosis of carotid artery without mention of cerebral infarction 10/18/2012  . Bilateral carotid artery disease 09/26/2012  . Hypotension, unspecified 08/24/2012  . Peripheral edema 08/04/2012  . Solitary pulmonary nodule 08/04/2012  . Chronic systolic CHF (congestive heart failure) 08/04/2012  . Dysuria 04/26/2012  . Weight loss 01/15/2012  . Diabetes 10/10/2011  . Former smoker 10/07/2011  . COPD (chronic obstructive pulmonary disease) 10/07/2011  . Allergic rhinitis, cause unspecified   . Positive TB test   . Preventative health care 10/02/2011    Tarius Stangelo 05-31-2014, 4:08 PM  [place], [place], [address] Phone: [phone]   Fax:  [place], PT 05-31-2014 4:08 PM

## 2014-05-10 NOTE — Telephone Encounter (Signed)
Gave avs. Per pof Hinton Dyer will schedule MTOC. Informed patient that [facility] will call to schedule PET scan.

## 2014-05-10 NOTE — Progress Notes (Signed)
  Radiation Oncology         [phone]) [phone] ________________________________  Initial outpatient Consultation - Date: 05/10/2014   Name: Tonya Valdez MRN: [id number]   DOB: [birth year]  REFERRING PHYSICIAN: Gaye Pollack, MD  DIAGNOSIS:    ICD-9-CM ICD-10-CM   1. Non-small cell cancer of left lung 162.9 C34.92     STAGE: No matching staging information was found for the patient.  HISTORY OF PRESENT ILLNESS::Tonya Valdez is a 74 y.o. female was found to have a lung masses in the process of working up for bladder cancer. This lesion in the right lower lobe near the chest wall was enlarging so a PET CT was done which showed hypermetabolic activity.  A biopsy done last fall was negative however. Her most recent imaging suggests some right hilar and subcarinal nodes. A repeat PET has not been ordered. She does not have brain imaging. She did undergo a CT guided biopsy on 3/3 which showed squamous cell carcinoma. She is unaccompanied today. She works part time still as a Programmer, applications.She is completely asymptomatic from this. She denies weight loss, fatigue, hemoptysis and headache  PREVIOUS RADIATION THERAPY: No  Past medical, social and family history were reviewed in the electronic chart. Review of symptoms was reviewed in the electronic chart. Medications were reviewed in the electronic chart.   PHYSICAL EXAM: There were no vitals filed for this visit.. . Pleasant female in no distress. Thin appearing.   IMPRESSION: Stage I vs. III NSCLC  PLAN: We discussed his diagnosis and stage. We discussed definitive radiation if she has a stage I tumor with no adenopathy versus chemoradiation in the management of Stage III disease. We discussed 6 weeks of treatment as an outpatient. We discussed the process of simulation and the placement of tattoos. We discussed dysphagia, weight loss and fatigue as the acute side effects of radiation. We discussed damage to critical normal structures in the chest  as well as the spinal cord as possible but improbably. We will schedule her for staging with repeat PET scan next week and a follow up with myself and Dr. Julien Nordmann. She will also need brain staging.    I spent 60 minutes  face to face with the patient and more than 50% of that time was spent in counseling and/or coordination of care.   ------------------------------------------------  Thea Silversmith, MD

## 2014-05-10 NOTE — Progress Notes (Signed)
Tonya Valdez [phone]   [phone] Multidisciplinary thoracic oncology clinic  CONSULT NOTE  REFERRING PHYSICIAN: Dr. Gaspar Bidding participate  REASON FOR CONSULTATION:  74 years old white female recently diagnosed with lung cancer.  HPI Tonya Valdez is a 74 y.o. female 's past medical history significant for multiple medical problems including history of COPD, diabetes mellitus, carotid artery stenosis, coronary artery disease status post CABG, cardiomyopathy, history of bladder cancer status post resection under the care of Dr. Karsten Ro followed by BCG treatment, congestive heart failure, peripheral vascular disease, dyslipidemia. The patient has a history of bladder cancer status post resection and was followed by regular imaging studies including CT scan of the abdomen at [facility] on 06/07/2012 that showed right lower lobe pulmonary nodule measuring 0.8 cm. The patient was followed closely with regular CT scan of the chest and scan on 05/09/2013 showed 1.4 cm predominantly linear nodular area increased in the posterior right lower lobes and the prior study. Repeat CT scan of the chest on 11/28/2013 showed interval enlargement of the right lower lobe nodule highly suspicious for primary bronchogenic carcinoma. A PET scan was performed on 12/11/2013 and it showed hypermetabolic right lower lobe pulmonary nodule most consistent with primary bronchogenic carcinoma but there was no evidence of mediastinal nodal metastasis or distant metastasis. The patient was seen by Dr. Cyndia Bent and CT-guided core biopsy of the right lower lobe pulmonary nodule was performed on 12/27/2013. The final cytology showed benign lung tissue and was negative for atypia or malignancy. She was followed by observation and repeat CT scan of the chest on 04/18/2014 showed increase in the size of the right lower lobe pulmonary nodule which is worrisome for primary bronchogenic carcinoma and  there was no evidence for hilar or mediastinal adenopathy or distant metastatic disease. There is also stable 2.1 cm sub-solid nodule in the right lower lobe.  Repeat CT guided core biopsy of the right lower lobe pulmonary nodule was performed by interventional radiology on 04/26/2014 and the final pathology (Accession: [id number]) was consistent with squamous cell carcinoma.  Dr. Cyndia Bent kindly referred the patient to me today for evaluation and recommendation regarding treatment of her condition. The patient is not a good surgical candidate for resection. When seen today she has no specific complaints except for weight loss of around 100 bound over several years partially secondary to being busy taking care of her sick husband and local diet. She denied having any significant chest pain, shortness breath, cough or hemoptysis. The patient denied having any significant nausea or vomiting or change in her bowel movement. She denied having any headache or visual changes. Family history significant for mother with cervical cancer and father had lung cancer and died at age 39. The patient is a widow and has one son and one stepdaughter. She is a retired Marine scientist. She has a history of smoking less than one pack per day for around 52 years and she quit in 2015. She drinks alcohol occasionally and no history of drug abuse.   HPI  Past Medical History  Diagnosis Date  . Positive TB test   . Bladder tumor   . COPD (chronic obstructive pulmonary disease)   . Diabetes mellitus TYPE II--  PER PCP NOTE DR Cathlean Cower    PER PT DOES NOT TAKES METFORMIN PRESCRIBED, SHE WATCHES DIET AND EXERCISES  . Cancer     bladder  . H/O hiatal hernia     s/p  74 yrs old  .  Chronic systolic CHF (congestive heart failure) 08/04/2012    Echo (08/17/12): EF 30%, diffuse HK, mild MR, mild BAE, PASP.;  Echo (01/2013): EF 40-45%, inferior posterior HK, Gr 2 DD, mild MR, mild LAE  . Ischemic cardiomyopathy   . CAD (coronary artery  disease)     a. LHC (10/07/12):  Ostial LM 60%, distal LM 40-50%, oLAD 40-50%, mLAD 70-80%, pCFX subtotally occluded, mCFX 80-90%, oRCA occluded, ostial L subclavian 40%, EF 25-30%, abdominal aorta diffusely diseased with severe ulcerated plaquing (right iliac ostial 95%; left renal long 60-70%, right okay). CO 2.1, CI 1.5 => s/p CABG (L-LAD, S-OM, S-PDA) 10/2012 with Dr. Cyndia Bent   . Bilateral carotid artery disease 09/26/2012    carotid US (8/14):  R 80-99; L 60-79 => s/p R CEA 10/2012 (Dr. Donnetta Hutching)  . Lung nodule     chest CT 09/2012 => needs repeat by 03/2013  . PAD (peripheral artery disease)   . Hyperlipidemia   . Hyperkalemia     ACEI d/c'd 11/2012  . Bladder cancer 04/26/2013    Past Surgical History  Procedure Laterality Date  . Tonsillectomy  1959  . Hiatal hernia repair  1964  (AGE 32)    CHOLECYSTECTOMY AND APPENDECTOMY  . Left elbow surgery    . Lumbar disc surgery  1993  . Abdominal hysterectomy  1981 (APPROX)    BILATERAL SALPINGO-OOPHORECTOMY WITH EXCEPTION A SMALL PIECE OF OVARY REMAINS  . Transurethral resection of bladder tumor N/A 06/24/2012    Procedure: TRANSURETHRAL RESECTION OF BLADDER TUMOR  WITH GYRUS AND INSTILLATION OF MITOMYCIN C  (TURBT);  Surgeon: Claybon Jabs, MD;  Location: [facility];  Service: Urology;  Laterality: N/A;  . Appendectomy    . Wrist surgery Left   . Cholecystectomy    . Tonsillectomy    . Cardiac catheterization    . Hammer toe surgery Left   . Coronary artery bypass graft N/A 11/14/2012    Procedure: CORONARY ARTERY BYPASS GRAFTING (CABG) times three using left internal mammary artery and left saphenous vein;  Surgeon: Gaye Pollack, MD; L-LAD, S-OM, S-PDA   . Intraoperative transesophageal echocardiogram N/A 11/14/2012    Procedure: INTRAOPERATIVE TRANSESOPHAGEAL ECHOCARDIOGRAM;  Surgeon: Gaye Pollack, MD;  Location: [facility] OR;  Service: Open Heart Surgery;  Laterality: N/A;  . Endarterectomy Right 11/14/2012    Procedure: RIGHT  ENDARTERECTOMY CAROTID with patch angioplasty.;  Surgeon: Rosetta Posner, MD;  Location: [facility] OR;  Service: Vascular;  Laterality: Right;    Family History  Problem Relation Age of Onset  . Cancer Other     colon cancer  . Hypertension Other   . Kidney disease Other   . Diabetes Other   . Heart disease Mother     MI at age 79  . Cervical cancer Mother   . Cancer Mother   . Diabetes Mother   . Hypertension Mother   . Lung cancer Father 74    primary site lung CA then colon and bone  . Colon cancer Father   . Cancer Father   . Hypertension Father     Social History History  Substance Use Topics  . Smoking status: Former Smoker -- 1.00 packs/day for 50 years    Types: Cigarettes    Quit date: 04/23/2013  . Smokeless tobacco: Never Used  . Alcohol Use: No     Comment: occasional wine not often    No Known Allergies  Current Outpatient Prescriptions  Medication Sig Dispense Refill  .  aspirin EC 325 MG EC tablet Take 1 tablet (325 mg total) by mouth daily. 30 tablet 0  . carvedilol (COREG) 12.5 MG tablet Take 1.5 tablets (18.75 mg total) by mouth 2 (two) times daily with a meal. 270 tablet 3  . furosemide (LASIX) 20 MG tablet Take 20 mg by mouth daily.    [name] glucose blood (ACCU-CHEK SMARTVIEW) test strip 1 each by Other route See admin instructions. Check blood sugar 4 times daily.    . hydrALAZINE (APRESOLINE) 25 MG tablet TAKE 1 TABLET THREE TIMES DAILY 270 tablet 3  . Hypromellose (ARTIFICIAL TEARS OP) Place 1 drop into both eyes 4 (four) times daily as needed (for dry eyes).    . isosorbide mononitrate (IMDUR) 30 MG 24 hr tablet TAKE 1 TABLET EVERY DAY 90 tablet 3  . metFORMIN (GLUCOPHAGE) 500 MG tablet Take 1 tablet (500 mg total) by mouth 2 (two) times daily with a meal. 180 tablet 3  . Multiple Vitamin (MULTIVITAMIN) tablet Take 1 tablet by mouth daily.    . pravastatin (PRAVACHOL) 40 MG tablet TAKE 1 TABLET EVERY EVENING 90 tablet 3  . Omega-3 Fatty Acids (FISH OIL) 1000  MG CAPS Take 1,000 mg by mouth daily.      No current facility-administered medications for this visit.    Review of Systems  Constitutional: positive for weight loss Eyes: negative Ears, nose, mouth, throat, and face: negative Respiratory: negative Cardiovascular: negative Gastrointestinal: negative Genitourinary:negative Integument/breast: negative Hematologic/lymphatic: negative Musculoskeletal:negative Neurological: negative Behavioral/Psych: negative Endocrine: negative Allergic/Immunologic: negative  Physical Exam  JKD:TOIZT, healthy, no distress, well nourished and well developed SKIN: skin color, texture, turgor are normal, no rashes or significant lesions HEAD: Normocephalic, No masses, lesions, tenderness or abnormalities EYES: normal, PERRLA EARS: External ears normal, Canals clear OROPHARYNX:no exudate, no erythema and lips, buccal mucosa, and tongue normal  NECK: supple, no adenopathy, no JVD LYMPH:  no palpable lymphadenopathy, no hepatosplenomegaly BREAST:not examined LUNGS: clear to auscultation , and palpation HEART: regular rate & rhythm, no murmurs and no gallops ABDOMEN:abdomen soft, non-tender, normal bowel sounds and no masses or organomegaly BACK: Back symmetric, no curvature., No CVA tenderness EXTREMITIES:no joint deformities, effusion, or inflammation, no edema, no skin discoloration  NEURO: alert & oriented x 3 with fluent speech, no focal motor/sensory deficits  PERFORMANCE STATUS: ECOG 1  LABORATORY DATA: Lab Results  Component Value Date   WBC 12.7* 05/08/2014   HGB 10.3* 05/08/2014   HCT 30.5* 05/08/2014   MCV 89.2 05/08/2014   PLT 246 05/08/2014      Chemistry      Component Value Date/Time   NA 139 05/08/2014 0451   K 3.4* 05/08/2014 0451   CL 105 05/08/2014 0451   CO2 27 05/08/2014 0451   BUN 16 05/08/2014 0451   CREATININE 0.79 05/08/2014 0451   CREATININE 0.77 11/27/2013 0950      Component Value Date/Time   CALCIUM  8.5 05/08/2014 0451   ALKPHOS 87 05/06/2014 2138   AST 41* 05/06/2014 2138   ALT 27 05/06/2014 2138   BILITOT 0.5 05/06/2014 2138       RADIOGRAPHIC STUDIES: Dg Chest 1 View  04/26/2014   CLINICAL DATA:  Status post right lung biopsy.  EXAM: CHEST  1 VIEW  COMPARISON:  December 27, 2013.  FINDINGS: The heart size and mediastinal contours are within normal limits. No pneumothorax is seen status post biopsy of right lower lobe mass. Status post coronary artery bypass graft. No significant pleural effusion is noted. The  visualized skeletal structures are unremarkable.  IMPRESSION: No pneumothorax seen status post biopsy of right lower lobe lung mass.   Electronically Signed   By: Marijo Conception, M.D.   On: 04/26/2014 13:59   Ct Angio Chest Pe W/cm &/or Wo Cm  05/06/2014   CLINICAL DATA:  Intermittent dyspnea since Friday.  EXAM: CT ANGIOGRAPHY CHEST WITH CONTRAST  TECHNIQUE: Multidetector CT imaging of the chest was performed using the standard protocol during bolus administration of intravenous contrast. Multiplanar CT image reconstructions and MIPs were obtained to evaluate the vascular anatomy.  CONTRAST:  47mL OMNIPAQUE IOHEXOL 350 MG/ML SOLN  COMPARISON:  05/06/2014 radiograph, chest CT 04/18/2014  FINDINGS: Cardiovascular: There is good opacification of the pulmonary arteries. There is no pulmonary embolism. The thoracic aorta is normal in caliber and intact.  Lungs: They known right lower lobe carcinoma appears unchanged from 04/18/2014. 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 No acute infiltrate or effusion is evident.  Central airways: Patent  Effusions: None  Lymphadenopathy: None  Esophagus: Unremarkable  Upper abdomen: No significant abnormality  Musculoskeletal: No significant abnormality  Review of the MIP images confirms the above findings.  IMPRESSION: Negative for pulmonary embolism. Unchanged right lower lobe mass. No acute findings are evident.   Electronically Signed   By:  Andreas Newport M.D.   On: 05/06/2014 23:29   Ct Biopsy  04/26/2014   CLINICAL DATA:  Enlarging right lower lobe lesion. Lesion is hypermetabolic on PET-CT.  EXAM: CT-GUIDED BIOPSY OF RIGHT LOWER LOBE NODULE  Physician: Stephan Minister. Henn, MD  MEDICATIONS: 1 mg Versed, 25 mcg fentanyl. A radiology nurse monitored the patient for moderate sedation.  ANESTHESIA/SEDATION: Sedation time: 25 minutes  PROCEDURE: The procedure was explained to the patient. The risks and benefits of the procedure were discussed and the patient's questions were addressed. Specifically, the risks of pneumothorax and hemoptysis were discussed with the patient. Informed consent was obtained from the patient.  The patient was placed prone. CT images through the lower chest were obtained. The lesion along the posterior right lower lobe was identified. The right side of the back was prepped and draped in sterile fashion. 1% lidocaine used for local anesthetic. A 17 gauge needle directed into the pleural-based lesion with CT guidance. Needle position was confirmed within the lesion. Three core biopsies were obtained with an 18 gauge core device. The 17 gauge needle was removed using a Bio[employer] tract plug. Bandage placed over the puncture site.  FINDINGS: There is a pleural-based lesion in the posterior right lower lobe roughly measuring 2.5 cm. Needle position confirmed within the lesion. No significant pneumothorax following the core biopsies.  COMPLICATIONS: None  IMPRESSION: CT-guided core biopsies of the posterior right lower lobe nodule.   Electronically Signed   By: Markus Daft M.D.   On: 04/26/2014 13:10   Dg Chest Portable 1 View  05/06/2014   CLINICAL DATA:  Acute onset of shortness of breath. Known right lower lobe pulmonary nodules. Initial encounter.  EXAM: PORTABLE CHEST - 1 VIEW  COMPARISON:  Chest radiograph performed 04/26/2014  FINDINGS: The patient's right lower lobe pulmonary nodule has been diagnosed as a squamous cell  carcinoma, on recent biopsy. It is again seen. Minimal bibasilar opacities likely reflect atelectasis. Mild scarring is noted at the lung apices. The subsolid pulmonary nodule within the right lower lobe is not well characterized on radiograph.  No pleural effusion or pneumothorax is seen. The cardiomediastinal silhouette remains normal in size. The patient is status post  median sternotomy. No acute osseous abnormalities are identified.  IMPRESSION: 1. 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 2. Minimal bibasilar opacities likely reflect atelectasis; mild scarring at the lung apices. Additional known subsolid pulmonary nodule in the right lower lobe is not well characterized on radiograph.   Electronically Signed   By: Garald Balding M.D.   On: 05/06/2014 21:54   Ct Chest Nodule Follow Up Low Dose W/o  04/18/2014   CLINICAL DATA:  Followup pulmonary nodule. History of bladder cancer with surgery and chemotherapy. Weight loss for the past 4 years  EXAM: CT CHEST WITHOUT CONTRAST  TECHNIQUE: Multidetector CT imaging of the chest was performed following the standard protocol without IV contrast.  COMPARISON:  12/11/2013  FINDINGS: Mediastinum: The heart size appears normal. There is no pericardial effusion. Previous median sternotomy and CABG procedure. There is calcified atherosclerotic disease involving the thoracic aorta as well as the native Coronary arteries. No mediastinal or hilar adenopathy.  Lungs/Pleura: No pleural effusion identified. Peripheral interstitial reticulation is identified bilaterally. Changes of paraseptal emphysema are also noted which appear upper lobe predominant. Right lower lobe subpleural mass measures 2.2 x 3 cm, image 103/series 3. This is increased in size from 1.7 x 1.2 cm previously. Also in the right lower lobe is a stable 6 mm nodule, image 89/series 3. Calcified granuloma is identified in the right upper lobe. The sub  solid nodule in the right lower lobe is unchanged measuring 2.1 cm, image 75/series 3.  Upper Abdomen: The visualized portions of the liver and spleen are unremarkable. The adrenal glands appear normal.  Musculoskeletal: There is mild multi level spondylosis within the thoracic spine.  IMPRESSION: 1. Increase in size of right lower lobe pulmonary nodule which is worrisome for primary bronchogenic carcinoma. No evidence for hilar or mediastinal adenopathy or distant metastatic disease. 2. Stable 2.1 cm sub solid nodule in the right lower lobe. Continued annual surveillance CT of this nodule is recommended to establish stability for a period of 3 years. This recommendation follows the consensus statement: Recommendations for the Management of Subsolid Pulmonary Nodules Detected at CT: A Statement from the [place] as published in Radiology 2013; 266:304-317.   Electronically Signed   By: Kerby Moors M.D.   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   PLAN: I had a lengthy discussion with the patient today about her current condition and treatment options. The patient is not a good candidate for surgical resection according to Dr. Cyndia Bent.  I recommended for the patient to complete her staging workup by repeating a PET scan to rule out any nodal metastatic disease or distant metastasis. There is no involvement of the mediastinum or evidence for metastatic disease, the patient may benefit from a course of curative radiotherapy to the right lower lobe lung mass, but if there is involvement of the mediastinum she may benefit from a course of concurrent chemotherapy and radiation. The patient will be seen later today by Dr. Pablo Ledger for evaluation and discussion of the  radiotherapy option. She would come back for follow-up visit in 2 weeks for evaluation and discussion of the PET scan results and further recommendation regarding her treatment. The patient was seen during the multidisciplinary  thoracic oncology clinic today by medical oncology, radiation oncology, thoracic navigator, social worker and physical therapist. She was advised to call immediately if she has any concerning symptoms in the interval.  The patient voices understanding of current disease status and treatment options and is in agreement with the current care plan.  All questions were answered. The patient knows to call the clinic with any problems, questions or concerns. We can certainly see the patient much sooner if necessary.  Thank you so much for allowing me to participate in the care of Tonya Valdez. I will continue to follow up the patient with you and assist in her care.  I spent 40 minutes counseling the patient face to face. The total time spent in the appointment was 60 minutes.  Disclaimer: This note was dictated with voice recognition software. Similar sounding words can inadvertently be transcribed and may not be corrected upon review.   Bufford Helms K. May 10, 2014, 4:30 PM

## 2014-05-11 ENCOUNTER — Encounter: Payer: Self-pay | Admitting: *Deleted

## 2014-05-11 NOTE — [facility] Oncology Navigator Note (Unsigned)
   Thoracic Treatment Summary Name:[place] R Ammie Ferrier Date:05/11/2014 DOB:1940-11-25 Your Medical Team Medical Oncologist:Dr. Julien Nordmann Radiation Oncologist:Dr. Pablo Ledger Surgeon:Dr. Bartle Type and Stage of Lung Cancer Non-Small Cell Carcinoma: Squamous Cell  Clinical Stage:  No matching staging information was found for the patient.   Clinical stage is based on radiology exams.  Pathological stage will be determined after surgery.  Staging is based on the size of the tumor, involvement of lymph nodes or not, and whether or not the cancer center has spread. Recommendations Recommendations: Treatment plan PET scan with follow up then chemo and radiation therapy  These recommendations are based on information available as of today's consult.  This is subject to change depending further testing or exams. Next Steps Next Step: PET scan appointment Medical Oncology will set up follow up appointments  Radiation Oncology will set up follow up apptointments Barriers to Care What do you perceive as a potential barrier that may prevent you from receiving your treatment plan? Education-information on lung cancer given and explained  Support information on resources at [facility] and through the [place] cancer Warden/ranger will be notified of patient upcoming treatment   Resources Given: NCI Booklet on [employer] at [employer].Radonna Ricker [phone]    Questions Norton Blizzard, RN BSN Thoracic Oncology Nurse Navigator at [place] is a nurse navigator that is available to assist you through your cancer journey.  She can answer your questions and/or provide resources regarding your treatment plan, emotional support, or financial concerns.

## 2014-05-15 ENCOUNTER — Other Ambulatory Visit: Payer: Self-pay | Admitting: *Deleted

## 2014-05-15 ENCOUNTER — Telehealth: Payer: Self-pay | Admitting: *Deleted

## 2014-05-15 ENCOUNTER — Telehealth (HOSPITAL_COMMUNITY): Payer: Self-pay

## 2014-05-15 DIAGNOSIS — C3492 Malignant neoplasm of unspecified part of left bronchus or lung: Secondary | ICD-10-CM

## 2014-05-15 NOTE — Telephone Encounter (Signed)
Encounter complete. 

## 2014-05-15 NOTE — Telephone Encounter (Signed)
Called patient to schedule for thoracic clinic on 05/24/14 arrive at 12:30.  Patient verbalized understanding of appt time and place.

## 2014-05-16 ENCOUNTER — Ambulatory Visit (HOSPITAL_BASED_OUTPATIENT_CLINIC_OR_DEPARTMENT_OTHER)
Admission: RE | Admit: 2014-05-16 | Discharge: 2014-05-16 | Disposition: A | Discharge status: Home / Self Care | Payer: Commercial Managed Care - HMO | Source: Ambulatory Visit | Attending: Cardiovascular Disease | Admitting: Cardiovascular Disease

## 2014-05-16 ENCOUNTER — Other Ambulatory Visit (HOSPITAL_COMMUNITY): Payer: Self-pay | Admitting: Physician Assistant

## 2014-05-16 ENCOUNTER — Ambulatory Visit (HOSPITAL_COMMUNITY)
Admission: RE | Admit: 2014-05-16 | Discharge: 2014-05-16 | Disposition: A | Discharge status: Home / Self Care | Payer: Commercial Managed Care - HMO | Source: Ambulatory Visit | Attending: Cardiovascular Disease | Admitting: Cardiovascular Disease

## 2014-05-16 DIAGNOSIS — R079 Chest pain, unspecified: Secondary | ICD-10-CM | POA: Insufficient documentation

## 2014-05-16 MED ORDER — REGADENOSON 0.4 MG/5ML IV SOLN
0.4000 mg | Freq: Once | INTRAVENOUS | Status: AC
  Administered 2014-05-16: 0.4 mg via INTRAVENOUS

## 2014-05-16 MED ORDER — AMINOPHYLLINE 25 MG/ML IV SOLN
75.0000 mg | Freq: Once | INTRAVENOUS | Status: AC
  Administered 2014-05-16: 75 mg via INTRAVENOUS

## 2014-05-16 MED ORDER — TECHNETIUM TC 99M SESTAMIBI GENERIC - CARDIOLITE
10.9000 | Freq: Once | INTRAVENOUS | Status: AC | PRN
  Administered 2014-05-16: 10.9 via INTRAVENOUS

## 2014-05-16 MED ORDER — TECHNETIUM TC 99M SESTAMIBI GENERIC - CARDIOLITE
31.4000 | Freq: Once | INTRAVENOUS | Status: AC | PRN
  Administered 2014-05-16: 31 via INTRAVENOUS

## 2014-05-16 NOTE — Procedures (Addendum)
Tonya Valdez [address] [place] Tonya Valdez [address] [phone]  Cardiology Nuclear Med Study  Tonya Valdez is a 74 y.o. female     MRN : [id number]     DOB: 1940/11/25  Procedure Date: 05/16/2014  Nuclear Med Background Indication for Stress Test:  [facility] and Follow up CAD History:  COPD and CAD;CABG X3;10/2012; Cardiac Risk Factors: Carotid Disease, Family History - CAD, History of Smoking, Lipids, NIDDM and PAD;AAA;Lung CA;cardiomyopathy  Symptoms:  SOB   Nuclear Pre-Procedure Caffeine/Decaff Intake:  1:30am NPO After: 9:30   IV Site: R Forearm  IV 0.9% NS with Angio Cath:  22g  Chest Size (in):  n/a IV Started by: Rolene Course, RN  Height: 5\' 2"  (1.575 m)  Cup Size: D  BMI:  Body mass index is 18.29 kg/(m^2). Weight:  100 lb (45.36 kg)   Tech Comments:  n/a    Nuclear Med Study 1 or 2 day study: 1 day  Stress Test Type:  [place]  Order Authorizing Provider:  Almyra Deforest, PA   Resting Radionuclide: Technetium 4m Sestamibi  Resting Radionuclide Dose: 10.9 mCi   Stress Radionuclide:  Technetium 46m Sestamibi  Stress Radionuclide Dose: 31.4 mCi           Stress Protocol Rest HR: 61 Stress HR: 82  Rest BP: 140/60 Stress BP: 155/55  Exercise Time (min): n/a METS: n/a          Dose of Adenosine (mg):  n/a Dose of Lexiscan: 0.4 mg  Dose of Atropine (mg): n/a Dose of Dobutamine: n/a mcg/kg/min (at max HR)  Stress Test Technologist: Mellody Memos, CCT Nuclear Technologist: Anthony Sar   Rest Procedure:  Myocardial perfusion imaging was performed at rest 45 minutes following the intravenous administration of Technetium 52m Sestamibi. Stress Procedure:  The patient received IV Lexiscan 0.4 mg over 15-seconds.  Technetium 62m Sestamibi injected IV at 30-seconds.  Patient experienced shortness of breath, abdominal pain, a drop in blood pressure and was administered 75 mg of Aminophylline IV.  There were no  significant changes with Lexiscan.  Quantitative spect images were obtained after a 45 minute delay.  Transient Ischemic Dilatation (Normal <1.22):  1.20  QGS EDV:  116 ml QGS ESV:  62 ml LV Ejection Fraction: 47%        Rest ECG: NSR - Normal EKG  Stress ECG: No significant change from baseline ECG  QPS Raw Data Images:  Normal; no motion artifact; normal heart/lung ratio. Stress Images:  There is decreased uptake in the inferior wall. Rest Images:  There is decreased uptake in the inferior wall. Subtraction (SDS):  These findings are consistent with ischemia.  Impression Exercise Capacity:  Lexiscan with no exercise. BP Response:  Normal blood pressure response. Clinical Symptoms:  No significant symptoms noted. ECG Impression:  No significant ST segment change suggestive of ischemia. Comparison with Prior Nuclear Study: No images to compare  Overall Impression:  Intermediate risk stress nuclear study Inferoapical ischemia, inferobasal scar. Follow up with Dr. Stanford Breed.  LV Wall Motion:  Mild LV dysfunction with moderate inferior hypokinesia   Tonya Harp, MD  05/16/2014 5:31 PM

## 2014-05-22 ENCOUNTER — Ambulatory Visit (HOSPITAL_COMMUNITY)
Admission: RE | Admit: 2014-05-22 | Discharge: 2014-05-22 | Disposition: A | Discharge status: Home / Self Care | Payer: Commercial Managed Care - HMO | Source: Ambulatory Visit | Attending: Internal Medicine | Admitting: Internal Medicine

## 2014-05-22 DIAGNOSIS — C3492 Malignant neoplasm of unspecified part of left bronchus or lung: Secondary | ICD-10-CM | POA: Diagnosis not present

## 2014-05-22 LAB — GLUCOSE, CAPILLARY: GLUCOSE-CAPILLARY: 123 mg/dL — AB (ref 70–99)

## 2014-05-22 MED ORDER — FLUDEOXYGLUCOSE F - 18 (FDG) INJECTION
4.9000 | Freq: Once | INTRAVENOUS | Status: AC | PRN
  Administered 2014-05-22: 4.9 via INTRAVENOUS

## 2014-05-23 ENCOUNTER — Telehealth: Payer: Self-pay | Admitting: *Deleted

## 2014-05-23 NOTE — Telephone Encounter (Signed)
Called pt and confirmed her clinic appt for 05/24/14.  Gave directions and instructions.

## 2014-05-24 ENCOUNTER — Ambulatory Visit (HOSPITAL_BASED_OUTPATIENT_CLINIC_OR_DEPARTMENT_OTHER): Payer: Commercial Managed Care - HMO | Admitting: Internal Medicine

## 2014-05-24 ENCOUNTER — Telehealth: Payer: Self-pay | Admitting: Internal Medicine

## 2014-05-24 ENCOUNTER — Encounter: Payer: Self-pay | Admitting: Internal Medicine

## 2014-05-24 ENCOUNTER — Other Ambulatory Visit (HOSPITAL_BASED_OUTPATIENT_CLINIC_OR_DEPARTMENT_OTHER): Payer: Commercial Managed Care - HMO

## 2014-05-24 ENCOUNTER — Ambulatory Visit
Admission: RE | Admit: 2014-05-24 | Discharge: 2014-05-24 | Disposition: A | Discharge status: Home / Self Care | Payer: Commercial Managed Care - HMO | Source: Ambulatory Visit | Attending: Radiation Oncology | Admitting: Radiation Oncology

## 2014-05-24 VITALS — BP 135/42 | HR 59 | Temp 97.7°F | Resp 17 | Ht 62.0 in | Wt 100.0 lb

## 2014-05-24 DIAGNOSIS — C3431 Malignant neoplasm of lower lobe, right bronchus or lung: Secondary | ICD-10-CM | POA: Diagnosis not present

## 2014-05-24 DIAGNOSIS — C3492 Malignant neoplasm of unspecified part of left bronchus or lung: Secondary | ICD-10-CM

## 2014-05-24 LAB — COMPREHENSIVE METABOLIC PANEL (CC13)
ALT: 18 U/L (ref 0–55)
AST: 24 U/L (ref 5–34)
Albumin: 3.5 g/dL (ref 3.5–5.0)
Alkaline Phosphatase: 90 U/L (ref 40–150)
Anion Gap: 9 meq/L (ref 3–11)
BUN: 19.5 mg/dL (ref 7.0–26.0)
CO2: 25 meq/L (ref 22–29)
Calcium: 9.2 mg/dL (ref 8.4–10.4)
Chloride: 106 meq/L (ref 98–109)
Creatinine: 0.8 mg/dL (ref 0.6–1.1)
EGFR: 68 ml/min/1.73 m2 — ABNORMAL LOW
Glucose: 132 mg/dL (ref 70–140)
Potassium: 4 meq/L (ref 3.5–5.1)
Sodium: 140 meq/L (ref 136–145)
Total Bilirubin: 0.45 mg/dL (ref 0.20–1.20)
Total Protein: 7.1 g/dL (ref 6.4–8.3)

## 2014-05-24 LAB — CBC WITH DIFFERENTIAL/PLATELET
BASO%: 0.8 % (ref 0.0–2.0)
Basophils Absolute: 0.1 10e3/uL (ref 0.0–0.1)
EOS%: 3.5 % (ref 0.0–7.0)
Eosinophils Absolute: 0.5 10e3/uL (ref 0.0–0.5)
HCT: 35.9 % (ref 34.8–46.6)
HGB: 12 g/dL (ref 11.6–15.9)
LYMPH%: 22.6 % (ref 14.0–49.7)
MCH: 30.9 pg (ref 25.1–34.0)
MCHC: 33.4 g/dL (ref 31.5–36.0)
MCV: 92.5 fL (ref 79.5–101.0)
MONO#: 0.8 10e3/uL (ref 0.1–0.9)
MONO%: 6.5 % (ref 0.0–14.0)
NEUT#: 8.6 10e3/uL — ABNORMAL HIGH (ref 1.5–6.5)
NEUT%: 66.6 % (ref 38.4–76.8)
Platelets: 244 10e3/uL (ref 145–400)
RBC: 3.88 10e6/uL (ref 3.70–5.45)
RDW: 14.2 % (ref 11.2–14.5)
WBC: 13 10e3/uL — ABNORMAL HIGH (ref 3.9–10.3)
lymph#: 2.9 10e3/uL (ref 0.9–3.3)

## 2014-05-24 NOTE — Telephone Encounter (Signed)
Pt confirmed labs/ov per 03/31 POF, gave pt AVS and Calendar.... KJ

## 2014-05-24 NOTE — Progress Notes (Signed)
[facility] [phone]   [phone]  OFFICE PROGRESS NOTE  Cathlean Cower, MD [place] [address]  DIAGNOSIS: Stage IA (T1b, N0, M0) non-small cell lung cancer, squamous cell carcinoma diagnosed in March 2016 and presenting with right lower lobe lung mass.  PRIOR THERAPY: None  CURRENT THERAPY: The patient is expected to undergo curative radiotherapy under the care of Dr. Pablo Ledger and the next 1-2 weeks.  INTERVAL HISTORY: Tonya Valdez 74 y.o. female returns to the clinic today for follow-up visit. The patient is feeling fine today with no specific complaints. She denied having any significant chest pain, shortness of breath, cough or hemoptysis. She has no nausea or vomiting, no fever or chills. No significant weight loss or night sweats. The patient had a PET scan performed on 05/22/2014 and she is here for evaluation and discussion of her treatment options.  MEDICAL HISTORY: Past Medical History  Diagnosis Date  . Positive TB test   . Bladder tumor   . COPD (chronic obstructive pulmonary disease)   . Diabetes mellitus TYPE II--  PER PCP NOTE DR Cathlean Cower    PER PT DOES NOT TAKES METFORMIN PRESCRIBED, SHE WATCHES DIET AND EXERCISES  . Cancer     bladder  . H/O hiatal hernia     s/p  74 yrs old  . Chronic systolic CHF (congestive heart failure) 08/04/2012    Echo (08/17/12): EF 30%, diffuse HK, mild MR, mild BAE, PASP.;  Echo (01/2013): EF 40-45%, inferior posterior HK, Gr 2 DD, mild MR, mild LAE  . Ischemic cardiomyopathy   . CAD (coronary artery disease)     a. LHC (10/07/12):  Ostial LM 60%, distal LM 40-50%, oLAD 40-50%, mLAD 70-80%, pCFX subtotally occluded, mCFX 80-90%, oRCA occluded, ostial L subclavian 40%, EF 25-30%, abdominal aorta diffusely diseased with severe ulcerated plaquing (right iliac ostial 95%; left renal long 60-70%, right okay). CO 2.1, CI 1.5 => s/p CABG (L-LAD, S-OM, S-PDA) 10/2012 with Dr. Cyndia Bent    . Bilateral carotid artery disease 09/26/2012    carotid US (8/14):  R 80-99; L 60-79 => s/p R CEA 10/2012 (Dr. Donnetta Hutching)  . Lung nodule     chest CT 09/2012 => needs repeat by 03/2013  . PAD (peripheral artery disease)   . Hyperlipidemia   . Hyperkalemia     ACEI d/c'd 11/2012  . Bladder cancer 04/26/2013    ALLERGIES:  has No Known Allergies.  MEDICATIONS:  Current Outpatient Prescriptions  Medication Sig Dispense Refill  . aspirin EC 325 MG EC tablet Take 1 tablet (325 mg total) by mouth daily. 30 tablet 0  . carvedilol (COREG) 12.5 MG tablet Take 1.5 tablets (18.75 mg total) by mouth 2 (two) times daily with a meal. 270 tablet 3  . furosemide (LASIX) 20 MG tablet Take 20 mg by mouth daily.    [name] glucose blood (ACCU-CHEK SMARTVIEW) test strip 1 each by Other route See admin instructions. Check blood sugar 4 times daily.    . hydrALAZINE (APRESOLINE) 25 MG tablet TAKE 1 TABLET THREE TIMES DAILY 270 tablet 3  . Hypromellose (ARTIFICIAL TEARS OP) Place 1 drop into both eyes 4 (four) times daily as needed (for dry eyes).    . isosorbide mononitrate (IMDUR) 30 MG 24 hr tablet TAKE 1 TABLET EVERY DAY 90 tablet 3  . metFORMIN (GLUCOPHAGE) 500 MG tablet Take 1 tablet (500 mg total) by mouth 2 (two) times daily  with a meal. 180 tablet 3  . Multiple Vitamin (MULTIVITAMIN) tablet Take 1 tablet by mouth daily.    . Omega-3 Fatty Acids (FISH OIL) 1000 MG CAPS Take 1,000 mg by mouth daily.     . pravastatin (PRAVACHOL) 40 MG tablet TAKE 1 TABLET EVERY EVENING 90 tablet 3   No current facility-administered medications for this visit.    SURGICAL HISTORY:  Past Surgical History  Procedure Laterality Date  . Tonsillectomy  1959  . Hiatal hernia repair  1964  (AGE 78)    CHOLECYSTECTOMY AND APPENDECTOMY  . Left elbow surgery    . Lumbar disc surgery  1993  . Abdominal hysterectomy  1981 (APPROX)    BILATERAL SALPINGO-OOPHORECTOMY WITH EXCEPTION A SMALL PIECE OF OVARY REMAINS  . Transurethral  resection of bladder tumor N/A 06/24/2012    Procedure: TRANSURETHRAL RESECTION OF BLADDER TUMOR  WITH GYRUS AND INSTILLATION OF MITOMYCIN C  (TURBT);  Surgeon: Claybon Jabs, MD;  Location: [facility];  Service: Urology;  Laterality: N/A;  . Appendectomy    . Wrist surgery Left   . Cholecystectomy    . Tonsillectomy    . Cardiac catheterization    . Hammer toe surgery Left   . Coronary artery bypass graft N/A 11/14/2012    Procedure: CORONARY ARTERY BYPASS GRAFTING (CABG) times three using left internal mammary artery and left saphenous vein;  Surgeon: Gaye Pollack, MD; L-LAD, S-OM, S-PDA   . Intraoperative transesophageal echocardiogram N/A 11/14/2012    Procedure: INTRAOPERATIVE TRANSESOPHAGEAL ECHOCARDIOGRAM;  Surgeon: Gaye Pollack, MD;  Location: [facility] OR;  Service: Open Heart Surgery;  Laterality: N/A;  . Endarterectomy Right 11/14/2012    Procedure: RIGHT ENDARTERECTOMY CAROTID with patch angioplasty.;  Surgeon: Rosetta Posner, MD;  Location: [facility] OR;  Service: Vascular;  Laterality: Right;    REVIEW OF SYSTEMS:  Constitutional: negative Eyes: negative Ears, nose, mouth, throat, and face: negative Respiratory: negative Cardiovascular: negative Gastrointestinal: negative Genitourinary:negative Integument/breast: negative Hematologic/lymphatic: negative Musculoskeletal:negative Neurological: negative Behavioral/Psych: negative Endocrine: negative Allergic/Immunologic: negative   PHYSICAL EXAMINATION: General appearance: alert, cooperative and no distress Head: Normocephalic, without obvious abnormality, atraumatic Neck: no adenopathy, no JVD, supple, symmetrical, trachea midline and thyroid not enlarged, symmetric, no tenderness/mass/nodules Lymph nodes: Cervical, supraclavicular, and axillary nodes normal. Resp: clear to auscultation bilaterally Back: symmetric, no curvature. ROM normal. No CVA tenderness. Cardio: regular rate and rhythm, S1, S2 normal, no murmur,  click, rub or gallop GI: soft, non-tender; bowel sounds normal; no masses,  no organomegaly Extremities: extremities normal, atraumatic, no cyanosis or edema Neurologic: Alert and oriented X 3, normal strength and tone. Normal symmetric reflexes. Normal coordination and gait  ECOG PERFORMANCE STATUS: 0 - Asymptomatic  Blood pressure 135/42, pulse 59, temperature 97.7 F (36.5 C), temperature source Oral, resp. rate 17, height 5\' 2"  (1.575 m), weight 100 lb (45.36 kg), SpO2 98 %.  LABORATORY DATA: Lab Results  Component Value Date   WBC 13.0* 05/24/2014   HGB 12.0 05/24/2014   HCT 35.9 05/24/2014   MCV 92.5 05/24/2014   PLT 244 05/24/2014      Chemistry      Component Value Date/Time   NA 139 05/08/2014 0451   K 3.4* 05/08/2014 0451   CL 105 05/08/2014 0451   CO2 27 05/08/2014 0451   BUN 16 05/08/2014 0451   CREATININE 0.79 05/08/2014 0451   CREATININE 0.77 11/27/2013 0950      Component Value Date/Time   CALCIUM 8.5 05/08/2014 0451  ALKPHOS 87 05/06/2014 2138   AST 41* 05/06/2014 2138   ALT 27 05/06/2014 2138   BILITOT 0.5 05/06/2014 2138       RADIOGRAPHIC STUDIES: Dg Chest 1 View  04/26/2014   CLINICAL DATA:  Status post right lung biopsy.  EXAM: CHEST  1 VIEW  COMPARISON:  December 27, 2013.  FINDINGS: The heart size and mediastinal contours are within normal limits. No pneumothorax is seen status post biopsy of right lower lobe mass. Status post coronary artery bypass graft. No significant pleural effusion is noted. The visualized skeletal structures are unremarkable.  IMPRESSION: No pneumothorax seen status post biopsy of right lower lobe lung mass.   Electronically Signed   By: Marijo Conception, M.D.   On: 04/26/2014 13:59   Ct Angio Chest Pe W/cm &/or Wo Cm  05/06/2014   CLINICAL DATA:  Intermittent dyspnea since Friday.  EXAM: CT ANGIOGRAPHY CHEST WITH CONTRAST  TECHNIQUE: Multidetector CT imaging of the chest was performed using the standard protocol during bolus  administration of intravenous contrast. Multiplanar CT image reconstructions and MIPs were obtained to evaluate the vascular anatomy.  CONTRAST:  38mL OMNIPAQUE IOHEXOL 350 MG/ML SOLN  COMPARISON:  05/06/2014 radiograph, chest CT 04/18/2014  FINDINGS: Cardiovascular: There is good opacification of the pulmonary arteries. There is no pulmonary embolism. The thoracic aorta is normal in caliber and intact.  Lungs: They known right lower lobe carcinoma appears unchanged from 04/18/2014. 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 No acute infiltrate or effusion is evident.  Central airways: Patent  Effusions: None  Lymphadenopathy: None  Esophagus: Unremarkable  Upper abdomen: No significant abnormality  Musculoskeletal: No significant abnormality  Review of the MIP images confirms the above findings.  IMPRESSION: Negative for pulmonary embolism. Unchanged right lower lobe mass. No acute findings are evident.   Electronically Signed   By: Andreas Newport M.D.   On: 05/06/2014 23:29   Nm Pet Image Restag (ps) Skull Base To Thigh  05/24/2014   ADDENDUM REPORT: 05/24/2014 08:27  ADDENDUM: The original report was by Dr. Kalman Jewels. The following addendum is by Dr. Van Clines:  Following intra disciplinary thoracic Oncology Conference today, I have confirmed the measurement of the ground-glass opacity in the right lower lobe in terms of standard uptake value, given that subjectively there appear to be some increased activity in this vicinity on imaging.  The standard uptake value is confirmed at 0.85 in the area of this ground-glass opacity. Subjectively it is increased from background activity but not in the definitely malignant range. Accordingly, this could still represent a low grade malignancy but is not highly specific for malignancy. Comparing CT scans from 05/06/2014 and 10/06/2012, this opacity is primarily flat and linear, with some very minimal increase in thickening along its posterior  portion, for example on image 31 of series 8 of the 05/06/2014 exam compared to corresponding image 23 of series 603 of the 10/06/2012 exam.   Electronically Signed   By: Van Clines M.D.   On: 05/24/2014 08:27   05/24/2014   CLINICAL DATA:  Subsequent treatment strategy for lung cancer.  EXAM: NUCLEAR MEDICINE PET SKULL BASE TO THIGH  TECHNIQUE: 4.9 mCi F-18 FDG was injected intravenously. Full-ring PET imaging was performed from the skull base to thigh after the radiotracer. CT data was obtained and used for attenuation correction and anatomic localization.  FASTING BLOOD GLUCOSE:  Value: 123 mg/dl  COMPARISON:  12/11/2013  FINDINGS: NECK  No hypermetabolic lymph nodes in the neck.  CHEST  The right lower lobe pulmonary nodule measures a maximum of 2.5 cm. This is enlarged significantly since the CT scan of 05/09/2013. This is hypermetabolic with SUV max of 27.0 (increased since prior scan 6.8).  The the ground-glass opacity in the superior segment of the right upper lobe is unchanged and has SUV max of 0.85.  Subpleural pulmonary nodule in the right lower lobe measures 6.5 mm and is unchanged. No hypermetabolism is demonstrated.  No new pulmonary lesions. Stable right upper lobe calcified granuloma and emphysematous changes and pulmonary scarring.  No hypermetabolic or enlarged mediastinal or hilar lymph nodes.  Stable dense aortic and coronary artery calcifications.  ABDOMEN/PELVIS  No abnormal hypermetabolic activity within the liver, pancreas, adrenal glands, or spleen. No hypermetabolic lymph nodes in the abdomen or pelvis.  SKELETON  No focal hypermetabolic activity to suggest skeletal metastasis.  IMPRESSION: Hypermetabolic right lower lobe pulmonary nodule increased since prior PET-CT (11.6 versus 6.8). No findings for mediastinal/hilar disease or metastatic disease.  Stable right lower lobe ground-glass opacity and subpleural pulmonary nodule without hypermetabolism.  Stable emphysematous changes  and pulmonary scarring.  Electronically Signed: By: Marijo Sanes M.D. On: 05/22/2014 09:20   Ct Biopsy  04/26/2014   CLINICAL DATA:  Enlarging right lower lobe lesion. Lesion is hypermetabolic on PET-CT.  EXAM: CT-GUIDED BIOPSY OF RIGHT LOWER LOBE NODULE  Physician: Stephan Minister. Henn, MD  MEDICATIONS: 1 mg Versed, 25 mcg fentanyl. A radiology nurse monitored the patient for moderate sedation.  ANESTHESIA/SEDATION: Sedation time: 25 minutes  PROCEDURE: The procedure was explained to the patient. The risks and benefits of the procedure were discussed and the patient's questions were addressed. Specifically, the risks of pneumothorax and hemoptysis were discussed with the patient. Informed consent was obtained from the patient.  The patient was placed prone. CT images through the lower chest were obtained. The lesion along the posterior right lower lobe was identified. The right side of the back was prepped and draped in sterile fashion. 1% lidocaine used for local anesthetic. A 17 gauge needle directed into the pleural-based lesion with CT guidance. Needle position was confirmed within the lesion. Three core biopsies were obtained with an 18 gauge core device. The 17 gauge needle was removed using a [employer] tract plug. Bandage placed over the puncture site.  FINDINGS: There is a pleural-based lesion in the posterior right lower lobe roughly measuring 2.5 cm. Needle position confirmed within the lesion. No significant pneumothorax following the core biopsies.  COMPLICATIONS: None  IMPRESSION: CT-guided core biopsies of the posterior right lower lobe nodule.   Electronically Signed   By: Markus Daft M.D.   On: 04/26/2014 13:10   Dg Chest Portable 1 View  05/06/2014   CLINICAL DATA:  Acute onset of shortness of breath. Known right lower lobe pulmonary nodules. Initial encounter.  EXAM: PORTABLE CHEST - 1 VIEW  COMPARISON:  Chest radiograph performed 04/26/2014  FINDINGS: The patient's right lower lobe pulmonary nodule  has been diagnosed as a squamous cell carcinoma, on recent biopsy. It is again seen. Minimal bibasilar opacities likely reflect atelectasis. Mild scarring is noted at the lung apices. The subsolid pulmonary nodule within the right lower lobe is not well characterized on radiograph.  No pleural effusion or pneumothorax is seen. The cardiomediastinal silhouette remains normal in size. The patient is status post median sternotomy. No acute osseous abnormalities are identified.  IMPRESSION: 1. 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 2.  Minimal bibasilar opacities likely reflect atelectasis; mild scarring at the lung apices. Additional known subsolid pulmonary nodule in the right lower lobe is not well characterized on radiograph.   Electronically Signed   By: Garald Balding M.D.   On: 05/06/2014 21:54    ASSESSMENT AND PLAN: This is a very pleasant 74 years old white female with a stage IA non-small cell lung cancer, squamous cell carcinoma with no evidence of metastatic disease in the mediastinum, but she has an adjacent mildly hypermetabolic groundglass opacity in the right lower lobe. I discussed the PET scan results with the patient today. I recommended for her to proceed with curative radiotherapy most likely was a stereotactic radiotherapy under the care of Dr. Pablo Ledger. I don't see a need for the patient to receive concurrent chemotherapy at this point. I will continue to monitor her closely with repeat CT scan of the chest in 6 months. The patient agreed to the current plan. She was advised to call if she has any concerning symptoms in the interval. The patient voices understanding of current disease status and treatment options and is in agreement with the current care plan.  All questions were answered. The patient knows to call the clinic with any problems, questions or concerns. We can certainly see the patient much sooner if  necessary.  Disclaimer: This note was dictated with voice recognition software. Similar sounding words can inadvertently be transcribed and may not be corrected upon review.

## 2014-05-24 NOTE — [facility] Oncology Navigator Note (Signed)
Patient at [facility] for [place].  Patient seen by Dr. Julien Nordmann and Dr. Pablo Ledger.  Patient reports that she is doing very well.  She doesn't have any pain and feels good.  She denies any questions about her plan of care.  I gave her my contact information and encouraged her to call me for any needs.

## 2014-05-24 NOTE — Progress Notes (Signed)
   Department of Radiation Oncology  Phone:  [phone] Fax:        [phone]   Name: Tonya Valdez MRN: [id number]  DOB: Sep 02, 1940  Date: 05/24/2014  Follow Up Visit Note  Diagnosis: Non-small cell cancer of left lung   Staging form: Lung, AJCC 7th Edition     Clinical stage from 05/10/2014: Stage IA (T1b, N0, M0) - Signed by Curt Bears, MD on 05/13/2014  Interval History: Tonya Valdez presents today for routine followup.  She had her PET scan which showed no evidence of metastatic disease. The mediastinal nodes were negative. The mass had enlarged to 2.5 cm with an SUV of 11.6 from her PET last year. She is still completely asymptomatic. There was a ground glass area in the right upper lobe which was not hypermetabolic. Physical Exam:  There were no vitals filed for this visit. Alert and oriented. No respiratory distress.   IMPRESSION: Tonya Valdez is a 74 y.o. female with a T2N0 SCCA of the Right lung  PLAN:  We discussed that he has stage I lung cancer and the results of SBRT with provide a greater than 90% local control. We discussed that she may fail in the mediastinum hilum or elsewhere in the body and radiation was a local only process. We discussed the process of simulation and the use of a pad all to decrease respiratory motion. We discussed the use of 4-dimensional simulation to minimize normal lung tissue treated and the use of respiratory compression. We discussed 3- 5 treatments occurring every other day as an outpatient. We discussed these treatments will last about 10-20 minutes and she would be here at the hospital for about an hour. We discussed that SBRT was unlikely to make her breathing any worse. It is also unlikely to make her breathing symptoms any better. We discussed possible side effects including her shoulder pain due to arm positioning and possible rib fracture withthe pleural-based nodules proximity to the ribs. We discussed damage to other critical normal structures  including heart, ribs, lung collapse, chronic cough, and brachial plexus injury. We discussed that without treatment this  could develop into a more aggressive or even metastatic cancer.    Tonya Silversmith, MD

## 2014-05-28 NOTE — Progress Notes (Signed)
HPI: FU CAD. Echo (08/17/12): EF 30%, diffuse HK, mild MR, mild BAE, PASP. Carotid US (4/25): RICA 95-63%; LICA 87-56%. Abdominal US (8/14): No AAA, severe bilateral CIA stenosis noted. She was referred to vascular surgery for possible CEA and evaluation of PAD. LHC (10/07/12): oLM 60%, dLM 40-50%, oLAD 40-50%, mLAD 70-80%, pCFX subtotally occluded, mCFX 80-90%, oRCA occluded, ostial L subclavian 40%, EF 25-30%, abdominal aorta diffusely diseased with severe ulcerated plaquing (right iliac ostial 95%; left renal long 60-70%, right okay). CO 2.1, CI 1.5. Pre-CABG ABIs: normal. She underwent CABG 11/14/12 (LIMA-LAD, SVG-OM, SVG-PDA) along with right CEA. Developed hyperkalemia with ACEI. Diagnosed with non small cell lung cancer. Recently admitted with dyspnea and chest pain. Troponin minimally elevated. Last echocardiogram March 2016 showed normal LV function, grade 2 diastolic dysfunction, moderate left atrial enlargement, mild mitral regurgitation and tricuspid regurgitation. Nuclear study March 2016 showed ejection fraction 47%. There was inferior apical ischemia and inferior basal scar. Chest CT March 2016 showed unchanged right lower lobe mass but no pulmonary embolus. Event monitor presently in place for palpitations. Since I last saw her, she denies dyspnea, chest pain, palpitations or syncope.  Current Outpatient Prescriptions  Medication Sig Dispense Refill  . aspirin EC 325 MG EC tablet Take 1 tablet (325 mg total) by mouth daily. 30 tablet 0  . carvedilol (COREG) 12.5 MG tablet Take 1.5 tablets (18.75 mg total) by mouth 2 (two) times daily with a meal. 270 tablet 3  . furosemide (LASIX) 20 MG tablet Take 20 mg by mouth daily.    [name] glucose blood (ACCU-CHEK SMARTVIEW) test strip 1 each by Other route See admin instructions. Check blood sugar 4 times daily.    . hydrALAZINE (APRESOLINE) 25 MG tablet TAKE 1 TABLET THREE TIMES DAILY 270 tablet 3  . Hypromellose (ARTIFICIAL TEARS OP) Place 1 drop  into both eyes 4 (four) times daily as needed (for dry eyes).    . isosorbide mononitrate (IMDUR) 30 MG 24 hr tablet TAKE 1 TABLET EVERY DAY 90 tablet 3  . metFORMIN (GLUCOPHAGE) 500 MG tablet Take 1 tablet (500 mg total) by mouth 2 (two) times daily with a meal. 180 tablet 3  . Multiple Vitamin (MULTIVITAMIN) tablet Take 1 tablet by mouth daily.    . Omega-3 Fatty Acids (FISH OIL) 1000 MG CAPS Take 1,000 mg by mouth daily.     . pravastatin (PRAVACHOL) 40 MG tablet TAKE 1 TABLET EVERY EVENING 90 tablet 3   No current facility-administered medications for this visit.     Past Medical History  Diagnosis Date  . Positive TB test   . Bladder tumor   . COPD (chronic obstructive pulmonary disease)   . Diabetes mellitus TYPE II--  PER PCP NOTE DR Cathlean Cower    PER PT DOES NOT TAKES METFORMIN PRESCRIBED, SHE WATCHES DIET AND EXERCISES  . Cancer     bladder  . H/O hiatal hernia     s/p  74 yrs old  . Chronic systolic CHF (congestive heart failure) 08/04/2012    Echo (08/17/12): EF 30%, diffuse HK, mild MR, mild BAE, PASP.;  Echo (01/2013): EF 40-45%, inferior posterior HK, Gr 2 DD, mild MR, mild LAE  . Ischemic cardiomyopathy   . CAD (coronary artery disease)     a. LHC (10/07/12):  Ostial LM 60%, distal LM 40-50%, oLAD 40-50%, mLAD 70-80%, pCFX subtotally occluded, mCFX 80-90%, oRCA occluded, ostial L subclavian 40%, EF 25-30%, abdominal aorta diffusely diseased with severe ulcerated  plaquing (right iliac ostial 95%; left renal long 60-70%, right okay). CO 2.1, CI 1.5 => s/p CABG (L-LAD, S-OM, S-PDA) 10/2012 with Dr. Cyndia Bent   . Bilateral carotid artery disease 09/26/2012    carotid US (8/14):  R 80-99; L 60-79 => s/p R CEA 10/2012 (Dr. Donnetta Hutching)  . Lung nodule     chest CT 09/2012 => needs repeat by 03/2013  . PAD (peripheral artery disease)   . Hyperlipidemia   . Hyperkalemia     ACEI d/c'd 11/2012  . Bladder cancer 04/26/2013    Past Surgical History  Procedure Laterality Date  . Tonsillectomy   1959  . Hiatal hernia repair  1964  (AGE 31)    CHOLECYSTECTOMY AND APPENDECTOMY  . Left elbow surgery    . Lumbar disc surgery  1993  . Abdominal hysterectomy  1981 (APPROX)    BILATERAL SALPINGO-OOPHORECTOMY WITH EXCEPTION A SMALL PIECE OF OVARY REMAINS  . Transurethral resection of bladder tumor N/A 06/24/2012    Procedure: TRANSURETHRAL RESECTION OF BLADDER TUMOR  WITH GYRUS AND INSTILLATION OF MITOMYCIN C  (TURBT);  Surgeon: Claybon Jabs, MD;  Location: [facility];  Service: Urology;  Laterality: N/A;  . Appendectomy    . Wrist surgery Left   . Cholecystectomy    . Tonsillectomy    . Cardiac catheterization    . Hammer toe surgery Left   . Coronary artery bypass graft N/A 11/14/2012    Procedure: CORONARY ARTERY BYPASS GRAFTING (CABG) times three using left internal mammary artery and left saphenous vein;  Surgeon: Gaye Pollack, MD; L-LAD, S-OM, S-PDA   . Intraoperative transesophageal echocardiogram N/A 11/14/2012    Procedure: INTRAOPERATIVE TRANSESOPHAGEAL ECHOCARDIOGRAM;  Surgeon: Gaye Pollack, MD;  Location: [facility] OR;  Service: Open Heart Surgery;  Laterality: N/A;  . Endarterectomy Right 11/14/2012    Procedure: RIGHT ENDARTERECTOMY CAROTID with patch angioplasty.;  Surgeon: Rosetta Posner, MD;  Location: [place];  Service: Vascular;  Laterality: Right;    History   Social History  . Marital Status: Widowed    Spouse Name: N/A  . Number of Children: 2  . Years of Education: 16   Occupational History  . RN, BSN    Social History Main Topics  . Smoking status: Former Smoker -- 1.00 packs/day for 50 years    Types: Cigarettes    Quit date: 04/23/2013  . Smokeless tobacco: Never Used  . Alcohol Use: No     Comment: occasional wine not often  . Drug Use: No  . Sexual Activity: Not on file   Other Topics Concern  . Not on file   Social History Narrative    ROS: no fevers or chills, productive cough, hemoptysis, dysphasia, odynophagia, melena,  hematochezia, dysuria, hematuria, rash, seizure activity, orthopnea, PND, pedal edema, claudication. Remaining systems are negative.  Physical Exam: Well-developed frail in no acute distress.  Skin is warm and dry.  HEENT is normal.  Neck is supple.  Chest is clear to auscultation with normal expansion.  Cardiovascular exam is regular rate and rhythm.  Abdominal exam nontender or distended. No masses palpated. Extremities show no edema. neuro grossly intact

## 2014-05-29 ENCOUNTER — Ambulatory Visit (INDEPENDENT_AMBULATORY_CARE_PROVIDER_SITE_OTHER): Payer: Commercial Managed Care - HMO | Admitting: Cardiology

## 2014-05-29 ENCOUNTER — Encounter: Payer: Self-pay | Admitting: Cardiology

## 2014-05-29 VITALS — BP 120/56 | HR 70 | Ht 62.0 in | Wt 101.3 lb

## 2014-05-29 DIAGNOSIS — I739 Peripheral vascular disease, unspecified: Secondary | ICD-10-CM

## 2014-05-29 DIAGNOSIS — I255 Ischemic cardiomyopathy: Secondary | ICD-10-CM

## 2014-05-29 DIAGNOSIS — I10 Essential (primary) hypertension: Secondary | ICD-10-CM

## 2014-05-29 DIAGNOSIS — I779 Disorder of arteries and arterioles, unspecified: Secondary | ICD-10-CM | POA: Diagnosis not present

## 2014-05-29 DIAGNOSIS — I5022 Chronic systolic (congestive) heart failure: Secondary | ICD-10-CM

## 2014-05-29 DIAGNOSIS — I2510 Atherosclerotic heart disease of native coronary artery without angina pectoris: Secondary | ICD-10-CM

## 2014-05-29 DIAGNOSIS — I2583 Coronary atherosclerosis due to lipid rich plaque: Secondary | ICD-10-CM

## 2014-05-29 DIAGNOSIS — C3492 Malignant neoplasm of unspecified part of left bronchus or lung: Secondary | ICD-10-CM

## 2014-05-29 NOTE — Assessment & Plan Note (Signed)
Continue aspirin and statin. I have reviewed her nuclear study with her. I do not consider it high risk. She is not having significant dyspnea or chest pain. Plan medical therapy for now.

## 2014-05-29 NOTE — Assessment & Plan Note (Signed)
Management oncology.

## 2014-05-29 NOTE — Assessment & Plan Note (Signed)
Continue aspirin and statin.followed by vascular surgery.

## 2014-05-29 NOTE — Patient Instructions (Signed)
Your physician wants you to follow-up in: 6 MONTHS WITH DR CRENSHAW You will receive a reminder letter in the mail two months in advance. If you don't receive a letter, please call our office to schedule the follow-up appointment.  

## 2014-05-29 NOTE — Assessment & Plan Note (Signed)
Continue aspirin and statin. 

## 2014-05-29 NOTE — Assessment & Plan Note (Signed)
LV function improved on most recent echo.

## 2014-05-29 NOTE — Assessment & Plan Note (Signed)
Continue present dose of Lasix. 

## 2014-05-29 NOTE — Assessment & Plan Note (Signed)
Blood pressure controlled. Continue present medications. 

## 2014-05-30 ENCOUNTER — Encounter: Payer: Self-pay | Admitting: *Deleted

## 2014-05-30 ENCOUNTER — Ambulatory Visit
Admission: RE | Admit: 2014-05-30 | Discharge: 2014-05-30 | Disposition: A | Discharge status: Home / Self Care | Payer: Commercial Managed Care - HMO | Source: Ambulatory Visit | Attending: Radiation Oncology | Admitting: Radiation Oncology

## 2014-05-30 DIAGNOSIS — C3492 Malignant neoplasm of unspecified part of left bronchus or lung: Secondary | ICD-10-CM | POA: Insufficient documentation

## 2014-05-30 DIAGNOSIS — Z510 Encounter for antineoplastic radiation therapy: Secondary | ICD-10-CM | POA: Insufficient documentation

## 2014-05-30 NOTE — [facility] Oncology Navigator Note (Unsigned)
Spoke with patient today in [place].  She states she is happy she only needs radiation and no chemotherapy at this time.  No needs addressed at this time.

## 2014-05-31 DIAGNOSIS — Z510 Encounter for antineoplastic radiation therapy: Secondary | ICD-10-CM | POA: Diagnosis not present

## 2014-05-31 NOTE — Progress Notes (Signed)
[facility] Simulation and Treatment Planning Note   Name: Tonya Valdez MRN: [id number]  Date: 05/31/2014  DOB: [birth year]   DIAGNOSIS: The encounter diagnosis was Non-small cell cancer of left lung.  CONSENT VERIFIED: yes  SET UP AND IMMOBILIZATION: Patient is setup supine in a vac loc with a custom moldable pillow for head and neck immobilization   NARRATIVE: The patient was brought to the [facility].  Identity was confirmed.  All relevant records and images related to the planned course of therapy were reviewed.  Then, the patient was positioned in a stable reproducible clinical set-up for radiation therapy.  CT images were obtained.  Skin markings were placed.  A four dimensional simulation was then performed to track tumor movement throughout the patients' breathing cycle. The CT images were loaded into the planning software where the target and avoidance structures were contoured.  The GTV was outlined on the free breathing, 4D and MIP image sets.  The radiation prescription was entered and confirmed.   TREATMENT PLANNING NOTE:  Treatment planning then occurred. I have requested 3D simulation with [facility] of the spinal cord, total lungs and gross tumor volume. I have also requested mlcs and an isodose plan.   Special treatment procedure will be performed as [name] will be receiving high dose per fraction.    A total of  complex treatment devices will be used

## 2014-06-05 ENCOUNTER — Other Ambulatory Visit: Payer: Self-pay | Admitting: Radiation Oncology

## 2014-06-12 ENCOUNTER — Encounter: Payer: Self-pay | Admitting: Radiation Oncology

## 2014-06-12 ENCOUNTER — Ambulatory Visit: Payer: Commercial Managed Care - HMO | Admitting: Radiation Oncology

## 2014-06-12 DIAGNOSIS — Z510 Encounter for antineoplastic radiation therapy: Secondary | ICD-10-CM | POA: Diagnosis not present

## 2014-06-14 ENCOUNTER — Ambulatory Visit
Admission: RE | Admit: 2014-06-14 | Discharge: 2014-06-14 | Disposition: A | Discharge status: Home / Self Care | Payer: Commercial Managed Care - HMO | Source: Ambulatory Visit | Attending: Radiation Oncology | Admitting: Radiation Oncology

## 2014-06-14 DIAGNOSIS — Z510 Encounter for antineoplastic radiation therapy: Secondary | ICD-10-CM | POA: Diagnosis not present

## 2014-06-14 DIAGNOSIS — C3492 Malignant neoplasm of unspecified part of left bronchus or lung: Secondary | ICD-10-CM

## 2014-06-14 MED ORDER — BIAFINE EX EMUL
CUTANEOUS | Status: DC | PRN
  Administered 2014-06-14: 11:00:00 via TOPICAL

## 2014-06-15 ENCOUNTER — Ambulatory Visit: Payer: Commercial Managed Care - HMO | Admitting: Radiation Oncology

## 2014-06-19 ENCOUNTER — Ambulatory Visit
Admission: RE | Admit: 2014-06-19 | Discharge: 2014-06-19 | Disposition: A | Discharge status: Home / Self Care | Payer: Commercial Managed Care - HMO | Source: Ambulatory Visit | Attending: Radiation Oncology | Admitting: Radiation Oncology

## 2014-06-19 VITALS — BP 144/42 | HR 60 | Temp 97.8°F | Resp 20 | Wt 102.2 lb

## 2014-06-19 DIAGNOSIS — C3492 Malignant neoplasm of unspecified part of left bronchus or lung: Secondary | ICD-10-CM

## 2014-06-19 DIAGNOSIS — Z510 Encounter for antineoplastic radiation therapy: Secondary | ICD-10-CM | POA: Diagnosis not present

## 2014-06-19 NOTE — Progress Notes (Signed)
Weekly assessment  of radiation SBRT to right lower lobe .Completed 1 of 5 treatments.Denies pain, shortness of breath or cough.Explained routine of clinic.Shown where to apply biafine twice daily to anterior and posterior right lower chest wall.

## 2014-06-19 NOTE — Progress Notes (Signed)
Weekly Management Note Current Dose: 10  Gy  Projected Dose: 50 Gy   Narrative:  The patient presents for routine under treatment assessment.  CBCT/MVCT images/Port film x-rays were reviewed.  The chart was checked. Doing well. No complaints. RN education performed.   Physical Findings: Weight: 102 lb 3.2 oz (46.358 kg). Unchanged  Impression:  The patient is tolerating radiation.  Plan:  Continue treatment as planned. Start radiaplex on back. Discussed rib fracture, skin toxicity and possibility of mediastinal recurrence.

## 2014-06-19 NOTE — Progress Notes (Signed)
  Radiation Oncology         [phone] ________________________________  Name: MARIAME RYBOLT MRN: [id number]  Date: 06/19/2014  DOB: [birth year]  Stereotactic Body Radiotherapy Treatment Procedure Note  NARRATIVE:  Tonya Valdez was brought to the stereotactic radiation treatment machine and placed supine on the CT couch. The patient was set up for stereotactic body radiotherapy on the body fix pillow.  3D TREATMENT PLANNING AND DOSIMETRY:  The patient's radiation plan was reviewed and approved prior to starting treatment.  It showed 3-dimensional radiation distributions overlaid onto the planning CT.  The [facility] for the target structures as well as the organs at risk were reviewed. The documentation of this is filed in the radiation oncology EMR.  SIMULATION VERIFICATION:  The patient underwent CT imaging on the treatment unit.  These were carefully aligned to document that the ablative radiation dose would cover the target volume and maximally spare the nearby organs at risk according to the planned distribution.  SPECIAL TREATMENT PROCEDURE: CORIANA ANGELLO received high dose ablative stereotactic body radiotherapy to the planned target volume without unforeseen complications. Treatment was delivered uneventfully. The high doses associated with stereotactic body radiotherapy and the significant potential risks require careful treatment set up and patient monitoring constituting a special treatment procedure   STEREOTACTIC TREATMENT MANAGEMENT:  Following delivery, the patient was evaluated clinically. The patient tolerated treatment without significant acute effects, and was discharged to home in stable condition.    PLAN: Continue treatment as planned.  _________________________   Thea Silversmith, MD

## 2014-06-20 ENCOUNTER — Ambulatory Visit: Payer: Medicare HMO | Admitting: Cardiology

## 2014-06-21 ENCOUNTER — Ambulatory Visit
Admission: RE | Admit: 2014-06-21 | Discharge: 2014-06-21 | Disposition: A | Discharge status: Home / Self Care | Payer: Commercial Managed Care - HMO | Source: Ambulatory Visit | Attending: Radiation Oncology | Admitting: Radiation Oncology

## 2014-06-21 ENCOUNTER — Other Ambulatory Visit: Payer: Self-pay | Admitting: Internal Medicine

## 2014-06-21 DIAGNOSIS — C3492 Malignant neoplasm of unspecified part of left bronchus or lung: Secondary | ICD-10-CM

## 2014-06-21 DIAGNOSIS — Z510 Encounter for antineoplastic radiation therapy: Secondary | ICD-10-CM | POA: Diagnosis not present

## 2014-06-21 MED ORDER — ACCU-CHEK FASTCLIX LANCETS MISC: Status: DC

## 2014-06-21 NOTE — Progress Notes (Signed)
  Radiation Oncology         [phone]) [phone] ________________________________  Name: Tonya Valdez MRN: [id number]  Date: 06/21/2014  DOB: Dec 31, 1940  Stereotactic Body Radiotherapy Treatment Procedure Note  NARRATIVE:  Tonya Valdez was brought to the stereotactic radiation treatment machine and placed supine on the CT couch. The patient was set up for stereotactic body radiotherapy on the body fix pillow.  3D TREATMENT PLANNING AND DOSIMETRY:  The patient's radiation plan was reviewed and approved prior to starting treatment.  It showed 3-dimensional radiation distributions overlaid onto the planning CT.  The [facility] for the target structures as well as the organs at risk were reviewed. The documentation of this is filed in the radiation oncology EMR.  SIMULATION VERIFICATION:  The patient underwent CT imaging on the treatment unit.  These were carefully aligned to document that the ablative radiation dose would cover the target volume and maximally spare the nearby organs at risk according to the planned distribution.  SPECIAL TREATMENT PROCEDURE: Tonya Valdez received high dose ablative stereotactic body radiotherapy to the planned target volume without unforeseen complications. Treatment was delivered uneventfully. The high doses associated with stereotactic body radiotherapy and the significant potential risks require careful treatment set up and patient monitoring constituting a special treatment procedure   STEREOTACTIC TREATMENT MANAGEMENT:  Following delivery, the patient was evaluated clinically. The patient tolerated treatment without significant acute effects, and was discharged to home in stable condition.    PLAN: Continue treatment as planned.  This document serves as a record of services personally performed by Thea Silversmith, MD. It was created on her behalf by Darcus Austin, a trained medical scribe. The creation of this record is based on the scribe's personal observations and  the provider's statements to them. This document has been checked and approved by the attending provider.     _________________________   Thea Silversmith, MD

## 2014-06-26 ENCOUNTER — Other Ambulatory Visit (HOSPITAL_COMMUNITY): Payer: Commercial Managed Care - HMO

## 2014-06-26 ENCOUNTER — Ambulatory Visit: Payer: Commercial Managed Care - HMO | Admitting: Vascular Surgery

## 2014-06-26 ENCOUNTER — Ambulatory Visit
Admission: RE | Admit: 2014-06-26 | Discharge: 2014-06-26 | Disposition: A | Discharge status: Home / Self Care | Payer: Commercial Managed Care - HMO | Source: Ambulatory Visit | Attending: Radiation Oncology | Admitting: Radiation Oncology

## 2014-06-26 DIAGNOSIS — Z510 Encounter for antineoplastic radiation therapy: Secondary | ICD-10-CM | POA: Diagnosis not present

## 2014-06-26 DIAGNOSIS — C3492 Malignant neoplasm of unspecified part of left bronchus or lung: Secondary | ICD-10-CM

## 2014-06-26 NOTE — Progress Notes (Signed)
  Radiation Oncology         [phone]) [phone] ________________________________  Name: SHAYANN GARBUTT MRN: [id number]  Date: 06/26/2014  DOB: 10-08-1940  Stereotactic Body Radiotherapy Treatment Procedure Note  NARRATIVE:  HEAVYN YEARSLEY was brought to the stereotactic radiation treatment machine and placed supine on the CT couch. The patient was set up for stereotactic body radiotherapy on the body fix pillow.  3D TREATMENT PLANNING AND DOSIMETRY:  The patient's radiation plan was reviewed and approved prior to starting treatment.  It showed 3-dimensional radiation distributions overlaid onto the planning CT.  The [facility] for the target structures as well as the organs at risk were reviewed. The documentation of this is filed in the radiation oncology EMR.  SIMULATION VERIFICATION:  The patient underwent CT imaging on the treatment unit.  These were carefully aligned to document that the ablative radiation dose would cover the target volume and maximally spare the nearby organs at risk according to the planned distribution.  SPECIAL TREATMENT PROCEDURE: LUTHER NEWHOUSE received high dose ablative stereotactic body radiotherapy to the planned target volume without unforeseen complications. Treatment was delivered uneventfully. The high doses associated with stereotactic body radiotherapy and the significant potential risks require careful treatment set up and patient monitoring constituting a special treatment procedure   STEREOTACTIC TREATMENT MANAGEMENT:  Following delivery, the patient was evaluated clinically. The patient tolerated treatment without significant acute effects, and was discharged to home in stable condition.    PLAN: Continue treatment as planned.  _________________________   Thea Silversmith, MD

## 2014-06-28 ENCOUNTER — Ambulatory Visit
Admission: RE | Admit: 2014-06-28 | Discharge: 2014-06-28 | Disposition: A | Discharge status: Home / Self Care | Payer: Commercial Managed Care - HMO | Source: Ambulatory Visit | Attending: Radiation Oncology | Admitting: Radiation Oncology

## 2014-06-28 DIAGNOSIS — Z510 Encounter for antineoplastic radiation therapy: Secondary | ICD-10-CM | POA: Diagnosis not present

## 2014-06-28 DIAGNOSIS — C3492 Malignant neoplasm of unspecified part of left bronchus or lung: Secondary | ICD-10-CM

## 2014-06-28 NOTE — Progress Notes (Signed)
  Radiation Oncology         [phone]) [phone] ________________________________  Name: Tonya Valdez MRN: [id number]  Date: 06/28/2014  DOB: [birth year]  Stereotactic Body Radiotherapy Treatment Procedure Note  NARRATIVE:  Tonya Valdez was brought to the stereotactic radiation treatment machine and placed supine on the CT couch. The patient was set up for stereotactic body radiotherapy on the body fix pillow.  3D TREATMENT PLANNING AND DOSIMETRY:  The patient's radiation plan was reviewed and approved prior to starting treatment.  It showed 3-dimensional radiation distributions overlaid onto the planning CT.  The [facility] for the target structures as well as the organs at risk were reviewed. The documentation of this is filed in the radiation oncology EMR.  SIMULATION VERIFICATION:  The patient underwent CT imaging on the treatment unit.  These were carefully aligned to document that the ablative radiation dose would cover the target volume and maximally spare the nearby organs at risk according to the planned distribution.  SPECIAL TREATMENT PROCEDURE: Tonya Valdez received high dose ablative stereotactic body radiotherapy to the planned target volume without unforeseen complications. Treatment was delivered uneventfully. The high doses associated with stereotactic body radiotherapy and the significant potential risks require careful treatment set up and patient monitoring constituting a special treatment procedure   STEREOTACTIC TREATMENT MANAGEMENT:  Following delivery, the patient was evaluated clinically. The patient tolerated treatment without significant acute effects, and was discharged to home in stable condition.    PLAN: Continue treatment as planned.  _________________________   Thea Silversmith, MD

## 2014-07-03 ENCOUNTER — Ambulatory Visit
Admission: RE | Admit: 2014-07-03 | Discharge: 2014-07-03 | Disposition: A | Discharge status: Home / Self Care | Payer: Commercial Managed Care - HMO | Source: Ambulatory Visit | Attending: Radiation Oncology | Admitting: Radiation Oncology

## 2014-07-03 ENCOUNTER — Encounter: Payer: Self-pay | Admitting: Radiation Oncology

## 2014-07-03 VITALS — BP 137/55 | HR 62 | Temp 97.6°F | Resp 20 | Wt 97.7 lb

## 2014-07-03 DIAGNOSIS — Z510 Encounter for antineoplastic radiation therapy: Secondary | ICD-10-CM | POA: Diagnosis not present

## 2014-07-03 DIAGNOSIS — C3492 Malignant neoplasm of unspecified part of left bronchus or lung: Secondary | ICD-10-CM

## 2014-07-03 NOTE — Progress Notes (Signed)
  Radiation Oncology         [phone]) [phone] ________________________________  Name: Tonya Valdez MRN: [id number]  Date: 07/03/2014  DOB: 1940/09/07  Stereotactic Body Radiotherapy Treatment Procedure Note  NARRATIVE:  Tonya Valdez was brought to the stereotactic radiation treatment machine and placed supine on the CT couch. The patient was set up for stereotactic body radiotherapy on the body fix pillow.  3D TREATMENT PLANNING AND DOSIMETRY:  The patient's radiation plan was reviewed and approved prior to starting treatment.  It showed 3-dimensional radiation distributions overlaid onto the planning CT.  The [facility] for the target structures as well as the organs at risk were reviewed. The documentation of this is filed in the radiation oncology EMR.  SIMULATION VERIFICATION:  The patient underwent CT imaging on the treatment unit.  These were carefully aligned to document that the ablative radiation dose would cover the target volume and maximally spare the nearby organs at risk according to the planned distribution.  SPECIAL TREATMENT PROCEDURE: Tonya Valdez received high dose ablative stereotactic body radiotherapy to the planned target volume without unforeseen complications. Treatment was delivered uneventfully. The high doses associated with stereotactic body radiotherapy and the significant potential risks require careful treatment set up and patient monitoring constituting a special treatment procedure   STEREOTACTIC TREATMENT MANAGEMENT:  Following delivery, the patient was evaluated clinically. The patient tolerated treatment without significant acute effects, and was discharged to home in stable condition.    PLAN: Continue treatment as planned.  _________________________   Thea Silversmith, MD

## 2014-07-03 NOTE — Progress Notes (Signed)
SBRT RLL ,5/5 completed,  no c/o sob,nausea or difficulty swallowing, eats frequently  Throughout the day smaller meals, appetite good, mild fatigue, no skin changes biaine daily, took orthostatic b/p's no pain BP 137/55 mmHg  Pulse 62  Temp(Src) 97.6 F (36.4 C) (Oral)  Resp 20  Wt 97 lb 11.2 oz (44.316 kg)  SpO2 100% standing b/p Siting b/p=142/62,P=55,RR=20  Wt Readings from Last 3 Encounters:  06/19/14 102 lb 3.2 oz (46.358 kg)  05/29/14 101 lb 4.8 oz (45.949 kg)  05/24/14 100 lb (45.36 kg)

## 2014-07-03 NOTE — Progress Notes (Signed)
Weekly Management Note Current Dose: 50  Gy  Projected Dose: 50 Gy   Narrative:  The patient presents for routine under treatment assessment.  CBCT/MVCT images/Port film x-rays were reviewed.  The chart was checked. Doing well. No complaints. RN education performed.   Physical Findings: Weight:  . Unchanged  Impression:  The patient finishes RT today.   Plan:  Follow up 1 month. Will discuss CT scan follow up plan at that time.  [facility] has ordered one scan in 6 months.

## 2014-07-06 NOTE — Progress Notes (Addendum)
  Radiation Oncology         [phone] ________________________________  Name: Tonya Valdez MRN: [id number]  Date: 07/03/2014  DOB: 07/30/1940  End of Treatment Note  Diagnosis:   Non-small cell cancer of left lung   Staging form: Lung, AJCC 7th Edition     Clinical stage from 05/10/2014: Stage IA (T1b, N0, M0) - Signed by Curt Bears, MD on 05/13/2014  Indication for treatment:  Curative     Radiation treatment dates:   06/19/2014, 06/21/2014, 06/26/2014, 06/28/2014, 07/03/2014  Site/dose:  Right lower lobe/ 50 Gy in 5 fractions at 10 Gy per fraction  Beams/energy:   3D dynamic conformal arcs with 6 MV photons.   Narrative: The patient tolerated radiation treatment relatively well.   She had no ill effects of treatment.   Plan: The patient has completed radiation treatment. The patient will return to radiation oncology clinic for routine followup in one month. I advised them to call or return sooner if they have any questions or concerns related to their recovery or treatment.  ------------------------------------------------  Thea Silversmith, MD

## 2014-07-10 ENCOUNTER — Other Ambulatory Visit (HOSPITAL_COMMUNITY): Payer: Commercial Managed Care - HMO

## 2014-07-10 ENCOUNTER — Ambulatory Visit: Payer: Commercial Managed Care - HMO | Admitting: Vascular Surgery

## 2014-07-11 ENCOUNTER — Encounter: Payer: Self-pay | Admitting: Cardiology

## 2014-07-22 NOTE — Progress Notes (Signed)
[facility] Radiation Oncology Simulation and Treatment Planning Note   Name: Tonya Valdez MRN: [id number]  Date: 05/30/14  DOB: [birth year]  Motion management note:  A four dimensional simulation was then performed to track tumor movement throughout the patients' breathing cycle. The CT images were loaded into the planning software where the target and avoidance structures were contoured.  The GTV was outlined on the free breathing, 4D and MIP image sets.  The radiation prescription was entered and confirmed.

## 2014-07-31 ENCOUNTER — Ambulatory Visit: Payer: Commercial Managed Care - HMO | Admitting: Cardiology

## 2014-08-09 ENCOUNTER — Encounter: Payer: Self-pay | Admitting: Radiation Oncology

## 2014-08-09 ENCOUNTER — Telehealth: Payer: Self-pay | Admitting: *Deleted

## 2014-08-09 ENCOUNTER — Ambulatory Visit
Admission: RE | Admit: 2014-08-09 | Discharge: 2014-08-09 | Disposition: A | Discharge status: Home / Self Care | Payer: Commercial Managed Care - HMO | Source: Ambulatory Visit | Attending: Radiation Oncology | Admitting: Radiation Oncology

## 2014-08-09 VITALS — BP 139/57 | HR 61 | Temp 97.7°F | Resp 20 | Ht 62.0 in | Wt 95.4 lb

## 2014-08-09 DIAGNOSIS — C3492 Malignant neoplasm of unspecified part of left bronchus or lung: Secondary | ICD-10-CM

## 2014-08-09 NOTE — Progress Notes (Addendum)
   Department of Radiation Oncology  Phone:  [phone] Fax:        [phone]   Name: Tonya Valdez MRN: [id number]  DOB: 11-15-1940  Date: 08/09/2014  Follow Up Visit Note  Diagnosis: Non-small cell cancer of right lung   Staging form: Lung, AJCC 7th Edition     Clinical stage from 05/10/2014: Stage IA (T1b, N0, M0) - Signed by Curt Bears, MD on 05/13/2014   Summary and Interval since last radiation: right lung (lower lobe) / 50 Gy in 5 fractions at 10 Gy per fraction completed 07/03/14  Interval History: Tonya Valdez presents today for routine followup. She denies pain. She reports a cough and a nasal drip from allergies. She reports coughing up clear mucus every once in a while. She denies hemoptysis. She denies having a sore throat or trouble swallowing. She reports occasional shortness of breath with extreme activity. She reports having about a 6 lb weight loss. She weighs herself at home and was staying at 97 lbs. She reports she is now 91 lbs on her home scale. She reports she gets full fast and also reports fatigue. 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  Physical Exam:  Filed Vitals:   08/09/14 0754  BP: 139/57  Pulse: 61  Temp: 97.7 F (36.5 C)  TempSrc: Oral  Resp: 20  Height: '5\' 2"'$  (1.575 m)  Weight: 95 lb 6.4 oz (43.273 kg)  SpO2: 97%   Brown area on back consistent with radiation change on the skin.   IMPRESSION: Tonya Valdez is a 74 y.o. female T1N0 non-small cell lung cancer; status post SBRT with no acute effects from treatment  PLAN:  I will order CT scan in two weeks and plan to scan again in 6 months. She has a follow up scan with Dr. Julien Nordmann in September. She knows to call me in the interim.  This document serves as a record of services personally performed by Thea Silversmith, MD. It was created on her behalf by Darcus Austin, a trained medical scribe. The creation of this record is based on the scribe's personal observations and the provider's  statements to them. This document has been checked and approved by the attending provider.   Thea Silversmith, MD

## 2014-08-09 NOTE — Telephone Encounter (Signed)
Called patient to inform of test for 08-14-14 @ 4 pm, spoke with patient and she is aware of this test.

## 2014-08-09 NOTE — Progress Notes (Signed)
Tonya Valdez here for follow up.  She denies pain.  She reports a cough from a nasal drip from allergies.  She reports coughing up clear mucus every once in a while.  She denies hemoptysis.  She denies having a sore throat or trouble swallowing.  She reports occasional shortness of breath with extreme activity.  She reports having about a 6 lb weight loss.  She weighs herself at home and was staying at 97 lbs.  She reports she is now 91 lbs on her home scale.  She reports she gets full fast.  She reports fatigue.  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  BP 139/57 mmHg  Pulse 61  Temp(Src) 97.7 F (36.5 C) (Oral)  Resp 20  Ht '5\' 2"'$  (1.575 m)  Wt 95 lb 6.4 oz (43.273 kg)  BMI 17.44 kg/m2  SpO2 97%   Wt Readings from Last 3 Encounters:  08/09/14 95 lb 6.4 oz (43.273 kg)  07/03/14 97 lb 11.2 oz (44.316 kg)  06/19/14 102 lb 3.2 oz (46.358 kg)

## 2014-08-10 ENCOUNTER — Encounter: Payer: Self-pay | Admitting: Vascular Surgery

## 2014-08-13 ENCOUNTER — Ambulatory Visit (HOSPITAL_COMMUNITY): Payer: Commercial Managed Care - HMO

## 2014-08-14 ENCOUNTER — Encounter: Payer: Self-pay | Admitting: Vascular Surgery

## 2014-08-14 ENCOUNTER — Ambulatory Visit (INDEPENDENT_AMBULATORY_CARE_PROVIDER_SITE_OTHER): Payer: Commercial Managed Care - HMO | Admitting: Vascular Surgery

## 2014-08-14 ENCOUNTER — Ambulatory Visit (HOSPITAL_COMMUNITY)
Admission: RE | Admit: 2014-08-14 | Discharge: 2014-08-14 | Disposition: A | Discharge status: Home / Self Care | Payer: Commercial Managed Care - HMO | Source: Ambulatory Visit | Attending: Vascular Surgery | Admitting: Vascular Surgery

## 2014-08-14 ENCOUNTER — Ambulatory Visit (HOSPITAL_COMMUNITY)
Admission: RE | Admit: 2014-08-14 | Discharge: 2014-08-14 | Disposition: A | Discharge status: Home / Self Care | Payer: Commercial Managed Care - HMO | Source: Ambulatory Visit | Attending: Radiation Oncology | Admitting: Radiation Oncology

## 2014-08-14 VITALS — BP 142/52 | HR 60 | Resp 14 | Ht 62.0 in | Wt 96.5 lb

## 2014-08-14 DIAGNOSIS — I6529 Occlusion and stenosis of unspecified carotid artery: Secondary | ICD-10-CM | POA: Diagnosis not present

## 2014-08-14 DIAGNOSIS — Z923 Personal history of irradiation: Secondary | ICD-10-CM | POA: Diagnosis not present

## 2014-08-14 DIAGNOSIS — Z87891 Personal history of nicotine dependence: Secondary | ICD-10-CM | POA: Diagnosis not present

## 2014-08-14 DIAGNOSIS — C3492 Malignant neoplasm of unspecified part of left bronchus or lung: Secondary | ICD-10-CM

## 2014-08-14 NOTE — Addendum Note (Signed)
Addended by: Mena Goes on: 08/14/2014 03:36 PM   Modules accepted: Orders

## 2014-08-14 NOTE — Progress Notes (Signed)
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 At my last visit with her one month ago she was being worked up for lung nodule that was diagnosis Mellina Benison stage cancer. She has received radiation treatment for this is for CT scan today for follow-up. She reports some fatigue related to the radiation treatment but otherwise doing quite well. She has had no neurologic deficit. She is status post right carotid endarterectomy in September 2014 by myself.  Past Medical History  Diagnosis Date  . Positive TB test   . Bladder tumor   . COPD (chronic obstructive pulmonary disease)   . Diabetes mellitus TYPE II--  PER PCP NOTE DR Cathlean Cower    PER PT DOES NOT TAKES METFORMIN PRESCRIBED, SHE WATCHES DIET AND EXERCISES  . Cancer     bladder  . H/O hiatal hernia     s/p  74 yrs old  . Chronic systolic CHF (congestive heart failure) 08/04/2012    Echo (08/17/12): EF 30%, diffuse HK, mild MR, mild BAE, PASP.;  Echo (01/2013): EF 40-45%, inferior posterior HK, Gr 2 DD, mild MR, mild LAE  . Ischemic cardiomyopathy   . CAD (coronary artery disease)     a. LHC (10/07/12):  Ostial LM 60%, distal LM 40-50%, oLAD 40-50%, mLAD 70-80%, pCFX subtotally occluded, mCFX 80-90%, oRCA occluded, ostial L subclavian 40%, EF 25-30%, abdominal aorta diffusely diseased with severe ulcerated plaquing (right iliac ostial 95%; left renal long 60-70%, right okay). CO 2.1, CI 1.5 => s/p CABG (L-LAD, S-OM, S-PDA) 10/2012 with Dr. Cyndia Bent   . Bilateral carotid artery disease 09/26/2012    carotid US (8/14):  R 80-99; L 60-79 => s/p R CEA 10/2012 (Dr. Donnetta Hutching)  . Lung nodule     chest CT 09/2012 => needs repeat by 03/2013  . PAD (peripheral artery disease)   . Hyperlipidemia   . Hyperkalemia     ACEI d/c'd 11/2012  . Bladder cancer 04/26/2013  . Radiation 4/26,4/28,5/3,5/5,5/10    Left lung 5 fractions at 10 Gy    History  Substance Use Topics  . Smoking status: Former Smoker -- 1.00 packs/day for  50 years    Types: Cigarettes    Quit date: 04/23/2013  . Smokeless tobacco: Never Used  . Alcohol Use: No     Comment: occasional wine not often    Family History  Problem Relation Age of Onset  . Cancer Other     colon cancer  . Hypertension Other   . Kidney disease Other   . Diabetes Other   . Heart disease Mother     MI at age 16  . Cervical cancer Mother   . Cancer Mother   . Diabetes Mother   . Hypertension Mother   . Lung cancer Father 89    primary site lung CA then colon and bone  . Colon cancer Father   . Cancer Father   . Hypertension Father     No Known Allergies   Current outpatient prescriptions:  .  ACCU-CHEK FASTCLIX LANCETS MISC, Use to help check blood sugars four times a day Dx E11.9, Disp: 408 each, Rfl: 3 .  aspirin EC 325 MG EC tablet, Take 1 tablet (325 mg total) by mouth daily., Disp: 30 tablet, Rfl: 0 .  carvedilol (COREG) 12.5 MG tablet, Take 1.5 tablets (18.75 mg total) by mouth 2 (two) times daily with a meal., Disp: 270 tablet, Rfl: 3 .  emollient (BIAFINE) cream, Apply topically 2 (two) times  daily., Disp: , Rfl:  .  furosemide (LASIX) 20 MG tablet, Take 20 mg by mouth daily., Disp: , Rfl:  .  glucose blood (ACCU-CHEK SMARTVIEW) test strip, Use to check blood sugars four times a day Dx E11.9, Disp: 400 each, Rfl: 3 .  hydrALAZINE (APRESOLINE) 25 MG tablet, TAKE 1 TABLET THREE TIMES DAILY, Disp: 270 tablet, Rfl: 3 .  Hypromellose (ARTIFICIAL TEARS OP), Place 1 drop into both eyes 4 (four) times daily as needed (for dry eyes)., Disp: , Rfl:  .  isosorbide mononitrate (IMDUR) 30 MG 24 hr tablet, TAKE 1 TABLET EVERY DAY, Disp: 90 tablet, Rfl: 3 .  metFORMIN (GLUCOPHAGE) 500 MG tablet, Take 1 tablet (500 mg total) by mouth 2 (two) times daily with a meal., Disp: 180 tablet, Rfl: 3 .  Multiple Vitamin (MULTIVITAMIN) tablet, Take 1 tablet by mouth daily., Disp: , Rfl:  .  Omega-3 Fatty Acids (FISH OIL) 1000 MG CAPS, Take 1,000 mg by mouth daily. ,  Disp: , Rfl:  .  pravastatin (PRAVACHOL) 40 MG tablet, TAKE 1 TABLET EVERY EVENING, Disp: 90 tablet, Rfl: 3  Filed Vitals:   08/14/14 1430 08/14/14 1431  BP: 135/55 142/52  Pulse: 59 60  Resp: 14   Height: '5\' 2"'$  (1.575 m)   Weight: 96 lb 8 oz (43.772 kg)     Body mass index is 17.65 kg/(m^2).       Physical exam she is well-developed a very thin white female no acute distress Grossly intact neurologically Neck incision is well-healed. She has harsh carotid bruits bilaterally Heart is regular rate and rhythm 2+ dorsalis pedis pulses bilaterally  She underwent repeat carotid duplex in our office and reviewed this with her. This shows a progression of her left carotid. Her end-diastolic velocity is now 335 cm/s as compared to 84 cm/s on last visit 6 months ago. Right carotid does show some stenosis in her common carotid but no recurrent stenosis of the endarterectomy.  Impression and plan progression of asymptomatic disease on the left to the severe level. I have recommended CT angiogram for further evaluation. She is to have a CT for chest workup today and we've called [facility] radiology determine if she could have CT scan as well. A reports this is not possible but since he did not have prior authorization. We will obtain her CT scan of the neck several weeks at her convenience and make further recommendations depending on the study

## 2014-08-15 ENCOUNTER — Telehealth: Payer: Self-pay

## 2014-08-15 ENCOUNTER — Encounter: Payer: Self-pay | Admitting: Skilled Nursing Facility1

## 2014-08-15 NOTE — Progress Notes (Signed)
Subjective:     Patient ID: Tonya Valdez, female   DOB: September 14, 1940, 74 y.o.   MRN: [id number]  HPI   Review of Systems     Objective:   Physical Exam To assist the pt in identifying dietary strategies to gain some lost wt back.    Assessment:     Pt was identified as being malnourished due to some lost wt. Pt was contacted via the telephone at [phone]. Pt was unavailable.    Plan:     Dietitian left a voicemail prompting the pt to contact [place], RD,LDN at [phone].

## 2014-08-20 ENCOUNTER — Telehealth: Payer: Self-pay

## 2014-08-20 NOTE — Telephone Encounter (Addendum)
Patient informed of ct chest results are good.Patient thankful of good news.Scheduled for ct  In September and follow up with Dr. Julien Nordmann.I will call at end of week with appointment for December as patient is out of town.

## 2014-08-24 ENCOUNTER — Ambulatory Visit (HOSPITAL_COMMUNITY)
Admission: RE | Admit: 2014-08-24 | Discharge: 2014-08-24 | Disposition: A | Discharge status: Home / Self Care | Payer: Commercial Managed Care - HMO | Source: Ambulatory Visit | Attending: Vascular Surgery | Admitting: Vascular Surgery

## 2014-08-24 ENCOUNTER — Other Ambulatory Visit: Payer: Self-pay

## 2014-08-24 ENCOUNTER — Encounter (HOSPITAL_COMMUNITY): Payer: Self-pay

## 2014-08-24 DIAGNOSIS — I6522 Occlusion and stenosis of left carotid artery: Secondary | ICD-10-CM

## 2014-08-24 DIAGNOSIS — I6529 Occlusion and stenosis of unspecified carotid artery: Secondary | ICD-10-CM

## 2014-08-24 LAB — POCT I-STAT CREATININE: CREATININE: 0.9 mg/dL (ref 0.44–1.00)

## 2014-08-24 MED ORDER — IOHEXOL 350 MG/ML SOLN
80.0000 mL | Freq: Once | INTRAVENOUS | Status: AC | PRN
  Administered 2014-08-24: 80 mL via INTRAVENOUS

## 2014-08-24 NOTE — Progress Notes (Signed)
Please cx cta head per Arbie Cookey RN at dr. Luther Parody office.   Pt to have a CTA Neck w/wo instead.

## 2014-08-30 ENCOUNTER — Telehealth: Payer: Self-pay

## 2014-08-30 NOTE — Telephone Encounter (Signed)
Called patient with appointment schedule to follow up [place] in December 2016.Also informed of other pending appointments with Dr.Mohamed as well as lab and ct of chest.Thankful for the call.

## 2014-09-26 ENCOUNTER — Other Ambulatory Visit: Payer: Self-pay

## 2014-09-26 DIAGNOSIS — Z9889 Other specified postprocedural states: Secondary | ICD-10-CM

## 2014-09-26 DIAGNOSIS — I6523 Occlusion and stenosis of bilateral carotid arteries: Secondary | ICD-10-CM

## 2014-09-26 NOTE — Progress Notes (Signed)
Dr. Donnetta Hutching reviewed pt's CTA Neck performed 08/24/14.  Recommended a 6 month f/u with Carotid ultrasound and office appt., for continued surveillance.  Pt. notified of results and of recommendation.  Verb. understanding.

## 2014-09-27 LAB — HM DIABETES EYE EXAM

## 2014-10-17 ENCOUNTER — Other Ambulatory Visit: Payer: Self-pay | Admitting: Internal Medicine

## 2014-11-06 ENCOUNTER — Encounter: Payer: Self-pay | Admitting: Internal Medicine

## 2014-11-06 ENCOUNTER — Ambulatory Visit: Payer: Commercial Managed Care - HMO | Admitting: Internal Medicine

## 2014-11-06 ENCOUNTER — Other Ambulatory Visit (INDEPENDENT_AMBULATORY_CARE_PROVIDER_SITE_OTHER): Payer: Commercial Managed Care - HMO

## 2014-11-06 ENCOUNTER — Ambulatory Visit (INDEPENDENT_AMBULATORY_CARE_PROVIDER_SITE_OTHER): Payer: Commercial Managed Care - HMO | Admitting: Internal Medicine

## 2014-11-06 VITALS — BP 134/50 | HR 59 | Temp 98.6°F | Ht 62.0 in | Wt 100.0 lb

## 2014-11-06 DIAGNOSIS — I2510 Atherosclerotic heart disease of native coronary artery without angina pectoris: Secondary | ICD-10-CM

## 2014-11-06 DIAGNOSIS — I10 Essential (primary) hypertension: Secondary | ICD-10-CM

## 2014-11-06 DIAGNOSIS — E119 Type 2 diabetes mellitus without complications: Secondary | ICD-10-CM

## 2014-11-06 LAB — HEPATIC FUNCTION PANEL
ALBUMIN: 3.4 g/dL — AB (ref 3.5–5.2)
ALK PHOS: 73 U/L (ref 39–117)
ALT: 12 U/L (ref 0–35)
AST: 21 U/L (ref 0–37)
Bilirubin, Direct: 0.1 mg/dL (ref 0.0–0.3)
Total Bilirubin: 0.3 mg/dL (ref 0.2–1.2)
Total Protein: 6.5 g/dL (ref 6.0–8.3)

## 2014-11-06 LAB — BASIC METABOLIC PANEL
BUN: 16 mg/dL (ref 6–23)
CALCIUM: 8.9 mg/dL (ref 8.4–10.5)
CO2: 30 mEq/L (ref 19–32)
CREATININE: 0.83 mg/dL (ref 0.40–1.20)
Chloride: 102 mEq/L (ref 96–112)
GFR: 71.21 mL/min (ref >60.00)
GLUCOSE: 123 mg/dL — AB (ref 70–99)
Potassium: 3.8 mEq/L (ref 3.5–5.1)
SODIUM: 138 meq/L (ref 135–145)

## 2014-11-06 LAB — LIPID PANEL
CHOLESTEROL: 116 mg/dL (ref 0–200)
HDL: 50.6 mg/dL (ref >39.00)
LDL CALC: 42 mg/dL (ref 0–99)
NonHDL: 64.9
Total CHOL/HDL Ratio: 2
Triglycerides: 113 mg/dL (ref 0.0–149.0)
VLDL: 22.6 mg/dL (ref 0.0–40.0)

## 2014-11-06 LAB — HEMOGLOBIN A1C: HEMOGLOBIN A1C: 6.7 % — AB (ref 4.6–6.5)

## 2014-11-06 NOTE — Assessment & Plan Note (Signed)
stable overall by history and exam, recent data reviewed with pt, and pt to continue medical treatment as before,  to f/u any worsening symptoms or concerns BP Readings from Last 3 Encounters:  11/06/14 134/50  08/14/14 142/52  08/09/14 139/57

## 2014-11-06 NOTE — Assessment & Plan Note (Signed)
stable overall by history and exam, recent data reviewed with pt, and pt to continue medical treatment as before,  to f/u any worsening symptoms or concerns, for referral for insur purpose to cardiology

## 2014-11-06 NOTE — Progress Notes (Signed)
Pre visit review using our clinic review tool, if applicable. No additional management support is needed unless otherwise documented below in the visit note. 

## 2014-11-06 NOTE — Assessment & Plan Note (Signed)
stable overall by history and exam, recent data reviewed with pt, and pt to continue medical treatment as before,  to f/u any worsening symptoms or concerns Lab Results  Component Value Date   HGBA1C 7.4* 05/02/2014   For f/u lab today

## 2014-11-06 NOTE — Patient Instructions (Signed)
Your referral to Dr Stanford Breed will be done  Please continue all other medications as before, and refills have been done if requested.  Please have the pharmacy call with any other refills you may need.  Please continue your efforts at being more active, low cholesterol diet, and weight control.  Please keep your appointments with your specialists as you may have planned

## 2014-11-06 NOTE — Progress Notes (Signed)
Subjective:    Patient ID: Tonya Valdez, female    DOB: 12-Nov-1940, 74 y.o.   MRN: [id number]  HPI  Here to f/u; overall doing ok,  Pt denies chest pain, increasing sob or doe, wheezing, orthopnea, PND, increased LE swelling, palpitations, dizziness or syncope.  Pt denies new neurological symptoms such as new headache, or facial or extremity weakness or numbness.  Pt denies polydipsia, polyuria, or low sugar episode.   Pt denies new neurological symptoms such as new headache, or facial or extremity weakness or numbness.   Pt states overall good compliance with meds, mostly trying to follow appropriate diet, with wt overall stable,  but little exercise however. No other complaints< Needs referral to card for insurance purpose.  No longer smokes Past Medical History  Diagnosis Date  . Positive TB test   . Bladder tumor   . COPD (chronic obstructive pulmonary disease)   . Diabetes mellitus TYPE II--  PER PCP NOTE DR Cathlean Cower    PER PT DOES NOT TAKES METFORMIN PRESCRIBED, SHE WATCHES DIET AND EXERCISES  . Cancer     bladder  . H/O hiatal hernia     s/p  74 yrs old  . Chronic systolic CHF (congestive heart failure) 08/04/2012    Echo (08/17/12): EF 30%, diffuse HK, mild MR, mild BAE, PASP.;  Echo (01/2013): EF 40-45%, inferior posterior HK, Gr 2 DD, mild MR, mild LAE  . Ischemic cardiomyopathy   . CAD (coronary artery disease)     a. LHC (10/07/12):  Ostial LM 60%, distal LM 40-50%, oLAD 40-50%, mLAD 70-80%, pCFX subtotally occluded, mCFX 80-90%, oRCA occluded, ostial L subclavian 40%, EF 25-30%, abdominal aorta diffusely diseased with severe ulcerated plaquing (right iliac ostial 95%; left renal long 60-70%, right okay). CO 2.1, CI 1.5 => s/p CABG (L-LAD, S-OM, S-PDA) 10/2012 with Dr. Cyndia Bent   . Bilateral carotid artery disease 09/26/2012    carotid US (8/14):  R 80-99; L 60-79 => s/p R CEA 10/2012 (Dr. Donnetta Hutching)  . Lung nodule     chest CT 09/2012 => needs repeat by 03/2013  . PAD (peripheral artery  disease)   . Hyperlipidemia   . Hyperkalemia     ACEI d/c'd 11/2012  . Bladder cancer 04/26/2013  . Radiation 4/26,4/28,5/3,5/5,5/10    Left lung 5 fractions at 10 Gy   Past Surgical History  Procedure Laterality Date  . Tonsillectomy  1959  . Hiatal hernia repair  1964  (AGE 25)    CHOLECYSTECTOMY AND APPENDECTOMY  . Left elbow surgery    . Lumbar disc surgery  1993  . Abdominal hysterectomy  1981 (APPROX)    BILATERAL SALPINGO-OOPHORECTOMY WITH EXCEPTION A SMALL PIECE OF OVARY REMAINS  . Transurethral resection of bladder tumor N/A 06/24/2012    Procedure: TRANSURETHRAL RESECTION OF BLADDER TUMOR  WITH GYRUS AND INSTILLATION OF MITOMYCIN C  (TURBT);  Surgeon: Claybon Jabs, MD;  Location: [facility];  Service: Urology;  Laterality: N/A;  . Appendectomy    . Wrist surgery Left   . Cholecystectomy    . Tonsillectomy    . Cardiac catheterization    . Hammer toe surgery Left   . Coronary artery bypass graft N/A 11/14/2012    Procedure: CORONARY ARTERY BYPASS GRAFTING (CABG) times three using left internal mammary artery and left saphenous vein;  Surgeon: Gaye Pollack, MD; L-LAD, S-OM, S-PDA   . Intraoperative transesophageal echocardiogram N/A 11/14/2012    Procedure: INTRAOPERATIVE TRANSESOPHAGEAL ECHOCARDIOGRAM;  Surgeon:  Gaye Pollack, MD;  Location: [place];  Service: Open Heart Surgery;  Laterality: N/A;  . Endarterectomy Right 11/14/2012    Procedure: RIGHT ENDARTERECTOMY CAROTID with patch angioplasty.;  Surgeon: Rosetta Posner, MD;  Location: [place];  Service: Vascular;  Laterality: Right;    reports that she quit smoking about 18 months ago. Her smoking use included Cigarettes. She has a 50 pack-year smoking history. She has never used smokeless tobacco. She reports that she does not drink alcohol or use illicit drugs. family history includes Cancer in her father, mother, and other; Cervical cancer in her mother; Colon cancer in her father; Diabetes in her mother and  other; Heart disease in her mother; Hypertension in her father, mother, and other; Kidney disease in her other; Lung cancer (age of onset: 65) in her father. No Known Allergies Current Outpatient Prescriptions on File Prior to Visit  Medication Sig Dispense Refill  . ACCU-CHEK FASTCLIX LANCETS MISC Use to help check blood sugars four times a day Dx E11.9 408 each 3  . aspirin EC 325 MG EC tablet Take 1 tablet (325 mg total) by mouth daily. 30 tablet 0  . Blood Glucose Monitoring Suppl (ACCU-CHEK NANO SMARTVIEW) W/DEVICE KIT Use to check blood sugars daily Dx E11.9 1 kit 0  . carvedilol (COREG) 12.5 MG tablet Take 1.5 tablets (18.75 mg total) by mouth 2 (two) times daily with a meal. 270 tablet 3  . emollient (BIAFINE) cream Apply topically 2 (two) times daily.    . furosemide (LASIX) 20 MG tablet Take 20 mg by mouth daily.    [name] glucose blood (ACCU-CHEK SMARTVIEW) test strip Use to check blood sugars four times a day Dx E11.9 400 each 3  . hydrALAZINE (APRESOLINE) 25 MG tablet TAKE 1 TABLET THREE TIMES DAILY 270 tablet 3  . Hypromellose (ARTIFICIAL TEARS OP) Place 1 drop into both eyes 4 (four) times daily as needed (for dry eyes).    . isosorbide mononitrate (IMDUR) 30 MG 24 hr tablet TAKE 1 TABLET EVERY DAY 90 tablet 3  . metFORMIN (GLUCOPHAGE) 500 MG tablet Take 1 tablet (500 mg total) by mouth 2 (two) times daily with a meal. 180 tablet 3  . Multiple Vitamin (MULTIVITAMIN) tablet Take 1 tablet by mouth daily.    . Omega-3 Fatty Acids (FISH OIL) 1000 MG CAPS Take 1,000 mg by mouth daily.     . pravastatin (PRAVACHOL) 40 MG tablet TAKE 1 TABLET EVERY EVENING 90 tablet 3   No current facility-administered medications on file prior to visit.   Review of Systems  Constitutional: Negative for unusual diaphoresis or night sweats HENT: Negative for ringing in ear or discharge Eyes: Negative for double vision or worsening visual disturbance.  Respiratory: Negative for choking and stridor.     Gastrointestinal: Negative for vomiting or other signifcant bowel change Genitourinary: Negative for hematuria or change in urine volume.  Musculoskeletal: Negative for other MSK pain or swelling Skin: Negative for color change and worsening wound.  Neurological: Negative for tremors and numbness other than noted  Psychiatric/Behavioral: Negative for decreased concentration or agitation other than above       Objective:   Physical Exam BP 134/50 mmHg  Pulse 59  Temp(Src) 98.6 F (37 C) (Oral)  Ht 5' 2"  (1.575 m)  Wt 100 lb (45.36 kg)  BMI 18.29 kg/m2  SpO2 97%  VS noted,  Constitutional: Pt appears in no significant distress HENT: Head: NCAT.  Right Ear: External ear normal.  Left  Ear: External ear normal.  Eyes: . Pupils are equal, round, and reactive to light. Conjunctivae and EOM are normal Neck: Normal range of motion. Neck supple.  Cardiovascular: Normal rate and regular rhythm.   Pulmonary/Chest: Effort normal and breath sounds without rales or wheezing.  Abd:  Soft, NT, ND, + BS Neurological: Pt is alert. Not confused , motor grossly intact Skin: Skin is warm. No rash, no LE edema Psychiatric: Pt behavior is normal. No agitation.     Assessment & Plan:

## 2014-11-21 ENCOUNTER — Ambulatory Visit (HOSPITAL_COMMUNITY)
Admission: RE | Admit: 2014-11-21 | Discharge: 2014-11-21 | Disposition: A | Discharge status: Home / Self Care | Payer: Commercial Managed Care - HMO | Source: Ambulatory Visit | Attending: Internal Medicine | Admitting: Internal Medicine

## 2014-11-21 ENCOUNTER — Other Ambulatory Visit (HOSPITAL_BASED_OUTPATIENT_CLINIC_OR_DEPARTMENT_OTHER): Payer: Commercial Managed Care - HMO

## 2014-11-21 ENCOUNTER — Encounter (HOSPITAL_COMMUNITY): Payer: Self-pay

## 2014-11-21 DIAGNOSIS — C3492 Malignant neoplasm of unspecified part of left bronchus or lung: Secondary | ICD-10-CM

## 2014-11-21 DIAGNOSIS — I7090 Unspecified atherosclerosis: Secondary | ICD-10-CM | POA: Insufficient documentation

## 2014-11-21 DIAGNOSIS — J432 Centrilobular emphysema: Secondary | ICD-10-CM | POA: Diagnosis not present

## 2014-11-21 DIAGNOSIS — R911 Solitary pulmonary nodule: Secondary | ICD-10-CM | POA: Diagnosis not present

## 2014-11-21 LAB — CBC WITH DIFFERENTIAL/PLATELET
BASO%: 1.3 % (ref 0.0–2.0)
Basophils Absolute: 0.1 10*3/uL (ref 0.0–0.1)
EOS%: 3.2 % (ref 0.0–7.0)
Eosinophils Absolute: 0.3 10*3/uL (ref 0.0–0.5)
HEMATOCRIT: 36.4 % (ref 34.8–46.6)
HEMOGLOBIN: 12.3 g/dL (ref 11.6–15.9)
LYMPH%: 16.2 % (ref 14.0–49.7)
MCH: 31.1 pg (ref 25.1–34.0)
MCHC: 33.7 g/dL (ref 31.5–36.0)
MCV: 92.2 fL (ref 79.5–101.0)
MONO#: 0.8 10*3/uL (ref 0.1–0.9)
MONO%: 8.6 % (ref 0.0–14.0)
NEUT%: 70.7 % (ref 38.4–76.8)
NEUTROS ABS: 7 10*3/uL — AB (ref 1.5–6.5)
PLATELETS: 224 10*3/uL (ref 145–400)
RBC: 3.95 10*6/uL (ref 3.70–5.45)
RDW: 14.4 % (ref 11.2–14.5)
WBC: 9.9 10*3/uL (ref 3.9–10.3)
lymph#: 1.6 10*3/uL (ref 0.9–3.3)

## 2014-11-21 LAB — COMPREHENSIVE METABOLIC PANEL (CC13)
ALBUMIN: 3.3 g/dL — AB (ref 3.5–5.0)
ALK PHOS: 76 U/L (ref 40–150)
ALT: 12 U/L (ref 0–55)
AST: 24 U/L (ref 5–34)
Anion Gap: 7 mEq/L (ref 3–11)
BUN: 14.7 mg/dL (ref 7.0–26.0)
CO2: 29 mEq/L (ref 22–29)
Calcium: 9.3 mg/dL (ref 8.4–10.4)
Chloride: 101 mEq/L (ref 98–109)
Creatinine: 0.8 mg/dL (ref 0.6–1.1)
EGFR: 68 mL/min/{1.73_m2} — AB (ref >90)
Glucose: 178 mg/dl — ABNORMAL HIGH (ref 70–140)
Potassium: 4.2 mEq/L (ref 3.5–5.1)
SODIUM: 137 meq/L (ref 136–145)
Total Bilirubin: 0.47 mg/dL (ref 0.20–1.20)
Total Protein: 6.7 g/dL (ref 6.4–8.3)

## 2014-11-21 MED ORDER — IOHEXOL 300 MG/ML  SOLN
75.0000 mL | Freq: Once | INTRAMUSCULAR | Status: AC | PRN
  Administered 2014-11-21: 75 mL via INTRAVENOUS

## 2014-11-28 ENCOUNTER — Ambulatory Visit (HOSPITAL_BASED_OUTPATIENT_CLINIC_OR_DEPARTMENT_OTHER): Payer: Commercial Managed Care - HMO | Admitting: Internal Medicine

## 2014-11-28 ENCOUNTER — Encounter: Payer: Self-pay | Admitting: Internal Medicine

## 2014-11-28 VITALS — BP 134/40 | HR 72 | Temp 98.1°F | Resp 18 | Ht 62.0 in | Wt 96.7 lb

## 2014-11-28 DIAGNOSIS — C3492 Malignant neoplasm of unspecified part of left bronchus or lung: Secondary | ICD-10-CM

## 2014-11-28 DIAGNOSIS — C3431 Malignant neoplasm of lower lobe, right bronchus or lung: Secondary | ICD-10-CM

## 2014-11-28 NOTE — Progress Notes (Signed)
[facility] [phone]   [phone]  OFFICE PROGRESS NOTE  Cathlean Cower, MD [place] [address]  DIAGNOSIS: Stage IA (T1b, N0, M0) non-small cell lung cancer, squamous cell carcinoma diagnosed in March 2016 and presenting with right lower lobe lung mass.  PRIOR THERAPY: Curative radiotherapy under the care of Dr. Pablo Ledger completed 07/03/2014.  CURRENT THERAPY: Observation.  INTERVAL HISTORY: Tonya Valdez 74 y.o. female returns to the clinic today for follow-up visit. The patient completed a course of curative radiotherapy to the right lower lobe lung mass under the care of Dr. Pablo Ledger in May 2016. She is feeling fine today with no specific complaints. She denied having any significant chest pain, shortness of breath, cough or hemoptysis. She has no nausea or vomiting, no fever or chills. No significant weight loss or night sweats. She had repeat CT scan of the chest performed recently and she is here for evaluation and discussion of her scan results.  MEDICAL HISTORY: Past Medical History  Diagnosis Date  . Positive TB test   . Bladder tumor   . COPD (chronic obstructive pulmonary disease)   . Diabetes mellitus TYPE II--  PER PCP NOTE DR Cathlean Cower    PER PT DOES NOT TAKES METFORMIN PRESCRIBED, SHE WATCHES DIET AND EXERCISES  . Cancer     bladder  . H/O hiatal hernia     s/p  74 yrs old  . Chronic systolic CHF (congestive heart failure) 08/04/2012    Echo (08/17/12): EF 30%, diffuse HK, mild MR, mild BAE, PASP.;  Echo (01/2013): EF 40-45%, inferior posterior HK, Gr 2 DD, mild MR, mild LAE  . Ischemic cardiomyopathy   . CAD (coronary artery disease)     a. LHC (10/07/12):  Ostial LM 60%, distal LM 40-50%, oLAD 40-50%, mLAD 70-80%, pCFX subtotally occluded, mCFX 80-90%, oRCA occluded, ostial L subclavian 40%, EF 25-30%, abdominal aorta diffusely diseased with severe ulcerated plaquing (right iliac ostial 95%; left renal  long 60-70%, right okay). CO 2.1, CI 1.5 => s/p CABG (L-LAD, S-OM, S-PDA) 10/2012 with Dr. Cyndia Bent   . Bilateral carotid artery disease 09/26/2012    carotid US (8/14):  R 80-99; L 60-79 => s/p R CEA 10/2012 (Dr. Donnetta Hutching)  . Lung nodule     chest CT 09/2012 => needs repeat by 03/2013  . PAD (peripheral artery disease)   . Hyperlipidemia   . Hyperkalemia     ACEI d/c'd 11/2012  . Bladder cancer 04/26/2013  . Radiation 4/26,4/28,5/3,5/5,5/10    Left lung 5 fractions at 10 Gy    ALLERGIES:  has No Known Allergies.  MEDICATIONS:  Current Outpatient Prescriptions  Medication Sig Dispense Refill  . ACCU-CHEK FASTCLIX LANCETS MISC Use to help check blood sugars four times a day Dx E11.9 408 each 3  . aspirin EC 325 MG EC tablet Take 1 tablet (325 mg total) by mouth daily. 30 tablet 0  . Blood Glucose Monitoring Suppl (ACCU-CHEK NANO SMARTVIEW) W/DEVICE KIT Use to check blood sugars daily Dx E11.9 1 kit 0  . carvedilol (COREG) 12.5 MG tablet Take 1.5 tablets (18.75 mg total) by mouth 2 (two) times daily with a meal. 270 tablet 3  . emollient (BIAFINE) cream Apply topically 2 (two) times daily.    . furosemide (LASIX) 20 MG tablet Take 20 mg by mouth daily.    [name] glucose blood (ACCU-CHEK SMARTVIEW) test strip Use to check blood sugars four times  a day Dx E11.9 400 each 3  . hydrALAZINE (APRESOLINE) 25 MG tablet TAKE 1 TABLET THREE TIMES DAILY 270 tablet 3  . Hypromellose (ARTIFICIAL TEARS OP) Place 1 drop into both eyes 4 (four) times daily as needed (for dry eyes).    . isosorbide mononitrate (IMDUR) 30 MG 24 hr tablet TAKE 1 TABLET EVERY DAY 90 tablet 3  . metFORMIN (GLUCOPHAGE) 500 MG tablet Take 1 tablet (500 mg total) by mouth 2 (two) times daily with a meal. 180 tablet 3  . Multiple Vitamin (MULTIVITAMIN) tablet Take 1 tablet by mouth daily.    . Omega-3 Fatty Acids (FISH OIL) 1000 MG CAPS Take 1,000 mg by mouth daily.     . pravastatin (PRAVACHOL) 40 MG tablet TAKE 1 TABLET EVERY EVENING 90  tablet 3   No current facility-administered medications for this visit.    SURGICAL HISTORY:  Past Surgical History  Procedure Laterality Date  . Tonsillectomy  1959  . Hiatal hernia repair  1964  (AGE 45)    CHOLECYSTECTOMY AND APPENDECTOMY  . Left elbow surgery    . Lumbar disc surgery  1993  . Abdominal hysterectomy  1981 (APPROX)    BILATERAL SALPINGO-OOPHORECTOMY WITH EXCEPTION A SMALL PIECE OF OVARY REMAINS  . Transurethral resection of bladder tumor N/A 06/24/2012    Procedure: TRANSURETHRAL RESECTION OF BLADDER TUMOR  WITH GYRUS AND INSTILLATION OF MITOMYCIN C  (TURBT);  Surgeon: Claybon Jabs, MD;  Location: [facility];  Service: Urology;  Laterality: N/A;  . Appendectomy    . Wrist surgery Left   . Cholecystectomy    . Tonsillectomy    . Cardiac catheterization    . Hammer toe surgery Left   . Coronary artery bypass graft N/A 11/14/2012    Procedure: CORONARY ARTERY BYPASS GRAFTING (CABG) times three using left internal mammary artery and left saphenous vein;  Surgeon: Gaye Pollack, MD; L-LAD, S-OM, S-PDA   . Intraoperative transesophageal echocardiogram N/A 11/14/2012    Procedure: INTRAOPERATIVE TRANSESOPHAGEAL ECHOCARDIOGRAM;  Surgeon: Gaye Pollack, MD;  Location: [facility] OR;  Service: Open Heart Surgery;  Laterality: N/A;  . Endarterectomy Right 11/14/2012    Procedure: RIGHT ENDARTERECTOMY CAROTID with patch angioplasty.;  Surgeon: Rosetta Posner, MD;  Location: [facility] OR;  Service: Vascular;  Laterality: Right;    REVIEW OF SYSTEMS:  A comprehensive review of systems was negative.   PHYSICAL EXAMINATION: General appearance: alert, cooperative and no distress Head: Normocephalic, without obvious abnormality, atraumatic Neck: no adenopathy, no JVD, supple, symmetrical, trachea midline and thyroid not enlarged, symmetric, no tenderness/mass/nodules Lymph nodes: Cervical, supraclavicular, and axillary nodes normal. Resp: clear to auscultation bilaterally Back:  symmetric, no curvature. ROM normal. No CVA tenderness. Cardio: regular rate and rhythm, S1, S2 normal, no murmur, click, rub or gallop GI: soft, non-tender; bowel sounds normal; no masses,  no organomegaly Extremities: extremities normal, atraumatic, no cyanosis or edema Neurologic: Alert and oriented X 3, normal strength and tone. Normal symmetric reflexes. Normal coordination and gait  ECOG PERFORMANCE STATUS: 0 - Asymptomatic  There were no vitals taken for this visit.  LABORATORY DATA: Lab Results  Component Value Date   WBC 9.9 11/21/2014   HGB 12.3 11/21/2014   HCT 36.4 11/21/2014   MCV 92.2 11/21/2014   PLT 224 11/21/2014      Chemistry      Component Value Date/Time   NA 137 11/21/2014 0906   NA 138 11/06/2014 1440   K 4.2 11/21/2014 0906  K 3.8 11/06/2014 1440   CL 102 11/06/2014 1440   CO2 29 11/21/2014 0906   CO2 30 11/06/2014 1440   BUN 14.7 11/21/2014 0906   BUN 16 11/06/2014 1440   CREATININE 0.8 11/21/2014 0906   CREATININE 0.83 11/06/2014 1440   CREATININE 0.77 11/27/2013 0950      Component Value Date/Time   CALCIUM 9.3 11/21/2014 0906   CALCIUM 8.9 11/06/2014 1440   ALKPHOS 76 11/21/2014 0906   ALKPHOS 73 11/06/2014 1440   AST 24 11/21/2014 0906   AST 21 11/06/2014 1440   ALT 12 11/21/2014 0906   ALT 12 11/06/2014 1440   BILITOT 0.47 11/21/2014 0906   BILITOT 0.3 11/06/2014 1440       RADIOGRAPHIC STUDIES: Ct Chest W Contrast  11/21/2014   CLINICAL DATA:  Restaging of left-sided lung cancer. Diagnosed 2014. CABG 2015. Chemotherapy and radiation therapy complete. Non-small-cell type primary.  EXAM: CT CHEST WITH CONTRAST  TECHNIQUE: Multidetector CT imaging of the chest was performed during intravenous contrast administration.  CONTRAST:  47m OMNIPAQUE IOHEXOL 300 MG/ML  SOLN  COMPARISON:  08/14/2014  FINDINGS: Mediastinum/Nodes: No supraclavicular adenopathy. Aortic and branch vessel atherosclerosis. Normal heart size, without pericardial  effusion. Prior median sternotomy. No central pulmonary embolism, on this non-dedicated study. Calcified mediastinal nodes are likely related to old granulomatous disease. No mediastinal or hilar adenopathy.  Lungs/Pleura: No pleural fluid.  Moderate centrilobular emphysema.  A calcified right upper lobe granuloma.  Right lower lobe dependent spiculated opacity measures 1.7 x 1.7 cm on image#/series# 44/5. Similar to 1.8 x 1.6 cm on the prior.  Adjacent right lower lobe 6 mm nodule on image 37 is unchanged.  More lateral right lower lobe ground-glass opacity measures 2.0 cm on image 30 and is unchanged. Clear left lung.  Upper abdomen: Normal imaged portions of the liver, spleen, stomach, pancreas, adrenal glands, kidneys.  Musculoskeletal: Osteopenia. Superior endplate compression deformity at L3 is chronic. Mild, without ventral canal encroachment.  IMPRESSION: 1. Centrilobular emphysema with similar appearance of a pleural-based right lower lobe nodular opacity. A smaller right lower lobe nodule and ground-glass opacity are also not significantly changed. Recommend attention on follow-up. 2. No thoracic adenopathy. 3. Advanced atherosclerosis; status post median sternotomy.   Electronically Signed   By: KAbigail MiyamotoM.D.   On: 11/21/2014 12:07    ASSESSMENT AND PLAN: This is a very pleasant 74years old white female with a stage IA non-small cell lung cancer, squamous cell carcinoma with no evidence of metastatic disease in the mediastinum, but she has an adjacent mildly hypermetabolic groundglass opacity in the right lower lobe. She status post curative radiotherapy to the right lower lobe lung mass. The recent CT scan of the chest showed no evidence for disease recurrence or progression. I discussed the scan results with the patient today. I recommended for her to continue on observation with repeat CT scan of the chest in 6 months. She was advised to call if she has any concerning symptoms in the  interval. The patient voices understanding of current disease status and treatment options and is in agreement with the current care plan.  All questions were answered. The patient knows to call the clinic with any problems, questions or concerns. We can certainly see the patient much sooner if necessary.  Disclaimer: This note was dictated with voice recognition software. Similar sounding words can inadvertently be transcribed and may not be corrected upon review.

## 2014-12-04 NOTE — Progress Notes (Signed)
HPI: FU CAD. Echo (08/17/12): EF 30%, diffuse HK, mild MR, mild BAE, PASP. Carotid US (0/10): RICA 93-23%; LICA 55-73%. Abdominal US (8/14): No AAA, severe bilateral CIA stenosis noted. She was referred to vascular surgery for possible CEA and evaluation of PAD. LHC (10/07/12): oLM 60%, dLM 40-50%, oLAD 40-50%, mLAD 70-80%, pCFX subtotally occluded, mCFX 80-90%, oRCA occluded, ostial L subclavian 40%, EF 25-30%, abdominal aorta diffusely diseased with severe ulcerated plaquing (right iliac ostial 95%; left renal long 60-70%, right okay). CO 2.1, CI 1.5. Pre-CABG ABIs: normal. She underwent CABG 11/14/12 (LIMA-LAD, SVG-OM, SVG-PDA) along with right CEA. Developed hyperkalemia with ACEI. Last echocardiogram March 2016 showed normal LV function, grade 2 diastolic dysfunction, moderate left atrial enlargement, mild mitral regurgitation and tricuspid regurgitation. Nuclear study March 2016 showed ejection fraction 47%. There was inferior apical ischemia and inferior basal scar. Event monitor 3/16 Showed sinus with PVCs. Since I last saw her, she denies dyspnea, chest pain, palpitations or syncope.  Current Outpatient Prescriptions  Medication Sig Dispense Refill  . ACCU-CHEK FASTCLIX LANCETS MISC Use to help check blood sugars four times a day Dx E11.9 408 each 3  . aspirin EC 325 MG EC tablet Take 1 tablet (325 mg total) by mouth daily. 30 tablet 0  . Blood Glucose Monitoring Suppl (ACCU-CHEK NANO SMARTVIEW) W/DEVICE KIT Use to check blood sugars daily Dx E11.9 1 kit 0  . carvedilol (COREG) 12.5 MG tablet Take 1.5 tablets (18.75 mg total) by mouth 2 (two) times daily with a meal. 270 tablet 3  . emollient (BIAFINE) cream Apply topically 2 (two) times daily.    . furosemide (LASIX) 20 MG tablet Take 20 mg by mouth daily.    [name] glucose blood (ACCU-CHEK SMARTVIEW) test strip Use to check blood sugars four times a day Dx E11.9 400 each 3  . hydrALAZINE (APRESOLINE) 25 MG tablet TAKE 1 TABLET THREE TIMES  DAILY 270 tablet 3  . Hypromellose (ARTIFICIAL TEARS OP) Place 1 drop into both eyes 4 (four) times daily as needed (for dry eyes).    . isosorbide mononitrate (IMDUR) 30 MG 24 hr tablet TAKE 1 TABLET EVERY DAY 90 tablet 3  . metFORMIN (GLUCOPHAGE) 500 MG tablet Take 1 tablet (500 mg total) by mouth 2 (two) times daily with a meal. 180 tablet 3  . Multiple Vitamin (MULTIVITAMIN) tablet Take 1 tablet by mouth daily.    . Omega-3 Fatty Acids (FISH OIL) 1000 MG CAPS Take 1,000 mg by mouth daily.     . pravastatin (PRAVACHOL) 40 MG tablet TAKE 1 TABLET EVERY EVENING 90 tablet 3   No current facility-administered medications for this visit.     Past Medical History  Diagnosis Date  . Positive TB test   . Bladder tumor   . COPD (chronic obstructive pulmonary disease) ([place])   . Diabetes mellitus ([place]) TYPE II--  PER PCP NOTE DR Cathlean Cower    PER PT DOES NOT TAKES METFORMIN PRESCRIBED, SHE WATCHES DIET AND EXERCISES  . Cancer ([place])     bladder  . H/O hiatal hernia     s/p  74 yrs old  . Chronic systolic CHF (congestive heart failure) ([place]) 08/04/2012    Echo (08/17/12): EF 30%, diffuse HK, mild MR, mild BAE, PASP.;  Echo (01/2013): EF 40-45%, inferior posterior HK, Gr 2 DD, mild MR, mild LAE  . Ischemic cardiomyopathy   . CAD (coronary artery disease)     a. LHC (10/07/12):  Ostial LM 60%,  distal LM 40-50%, oLAD 40-50%, mLAD 70-80%, pCFX subtotally occluded, mCFX 80-90%, oRCA occluded, ostial L subclavian 40%, EF 25-30%, abdominal aorta diffusely diseased with severe ulcerated plaquing (right iliac ostial 95%; left renal long 60-70%, right okay). CO 2.1, CI 1.5 => s/p CABG (L-LAD, S-OM, S-PDA) 10/2012 with Dr. Cyndia Bent   . Bilateral carotid artery disease ([place]) 09/26/2012    carotid US (8/14):  R 80-99; L 60-79 => s/p R CEA 10/2012 (Dr. Donnetta Hutching)  . Lung nodule     chest CT 09/2012 => needs repeat by 03/2013  . PAD (peripheral artery disease) ([place])   . Hyperlipidemia   . Hyperkalemia     ACEI d/c'd  11/2012  . Bladder cancer ([place]) 04/26/2013  . Radiation 4/26,4/28,5/3,5/5,5/10    Left lung 5 fractions at 10 Gy    Past Surgical History  Procedure Laterality Date  . Tonsillectomy  1959  . Hiatal hernia repair  1964  (AGE 67)    CHOLECYSTECTOMY AND APPENDECTOMY  . Left elbow surgery    . Lumbar disc surgery  1993  . Abdominal hysterectomy  1981 (APPROX)    BILATERAL SALPINGO-OOPHORECTOMY WITH EXCEPTION A SMALL PIECE OF OVARY REMAINS  . Transurethral resection of bladder tumor N/A 06/24/2012    Procedure: TRANSURETHRAL RESECTION OF BLADDER TUMOR  WITH GYRUS AND INSTILLATION OF MITOMYCIN C  (TURBT);  Surgeon: Claybon Jabs, MD;  Location: [facility];  Service: Urology;  Laterality: N/A;  . Appendectomy    . Wrist surgery Left   . Cholecystectomy    . Tonsillectomy    . Cardiac catheterization    . Hammer toe surgery Left   . Coronary artery bypass graft N/A 11/14/2012    Procedure: CORONARY ARTERY BYPASS GRAFTING (CABG) times three using left internal mammary artery and left saphenous vein;  Surgeon: Gaye Pollack, MD; L-LAD, S-OM, S-PDA   . Intraoperative transesophageal echocardiogram N/A 11/14/2012    Procedure: INTRAOPERATIVE TRANSESOPHAGEAL ECHOCARDIOGRAM;  Surgeon: Gaye Pollack, MD;  Location: [facility] OR;  Service: Open Heart Surgery;  Laterality: N/A;  . Endarterectomy Right 11/14/2012    Procedure: RIGHT ENDARTERECTOMY CAROTID with patch angioplasty.;  Surgeon: Rosetta Posner, MD;  Location: [facility] OR;  Service: Vascular;  Laterality: Right;    Social History   Social History  . Marital Status: Widowed    Spouse Name: N/A  . Number of Children: 2  . Years of Education: 16   Occupational History  . RN, BSN    Social History Main Topics  . Smoking status: Former Smoker -- 1.00 packs/day for 50 years    Types: Cigarettes    Quit date: 04/23/2013  . Smokeless tobacco: Never Used  . Alcohol Use: No     Comment: occasional wine not often  . Drug Use: No  . Sexual  Activity: Not on file   Other Topics Concern  . Not on file   Social History Narrative    ROS: no fevers or chills, productive cough, hemoptysis, dysphasia, odynophagia, melena, hematochezia, dysuria, hematuria, rash, seizure activity, orthopnea, PND, pedal edema, claudication. Remaining systems are negative.  Physical Exam: Well-developed frail in no acute distress.  Skin is warm and dry.  HEENT is normal.  Neck is supple.  Chest Diminished breath sounds throughout Cardiovascular exam is regular rate and rhythm. 1/6 systolic murmur left sternal border. Abdominal exam nontender or distended. No masses palpated. Extremities show no edema. neuro grossly intact  ECG Sinus rhythm at a rate of 74, no significant ST  changes.

## 2014-12-07 ENCOUNTER — Encounter: Payer: Self-pay | Admitting: Cardiology

## 2014-12-07 ENCOUNTER — Ambulatory Visit (INDEPENDENT_AMBULATORY_CARE_PROVIDER_SITE_OTHER): Payer: Commercial Managed Care - HMO | Admitting: Cardiology

## 2014-12-07 VITALS — BP 122/74 | HR 74 | Ht 62.0 in | Wt 93.0 lb

## 2014-12-07 DIAGNOSIS — I779 Disorder of arteries and arterioles, unspecified: Secondary | ICD-10-CM | POA: Diagnosis not present

## 2014-12-07 DIAGNOSIS — I739 Peripheral vascular disease, unspecified: Secondary | ICD-10-CM

## 2014-12-07 DIAGNOSIS — I25110 Atherosclerotic heart disease of native coronary artery with unstable angina pectoris: Secondary | ICD-10-CM

## 2014-12-07 DIAGNOSIS — I5022 Chronic systolic (congestive) heart failure: Secondary | ICD-10-CM

## 2014-12-07 DIAGNOSIS — I10 Essential (primary) hypertension: Secondary | ICD-10-CM

## 2014-12-07 DIAGNOSIS — I255 Ischemic cardiomyopathy: Secondary | ICD-10-CM

## 2014-12-07 NOTE — Assessment & Plan Note (Signed)
Continue aspirin and statin. Followed by vascular surgery. 

## 2014-12-07 NOTE — Assessment & Plan Note (Signed)
LV function improved on most recent echocardiogram. Continue beta blocker and hydralazine/nitrates. ACE inhibitor caused hyperkalemia previously.

## 2014-12-07 NOTE — Assessment & Plan Note (Signed)
Patient is euvolemic on examination.Continue present dose of Lasix. 

## 2014-12-07 NOTE — Assessment & Plan Note (Signed)
Blood pressure controlled. Continue present medications. 

## 2014-12-07 NOTE — Patient Instructions (Signed)
Your physician wants you to follow-up in: ONE YEAR WITH DR CRENSHAW You will receive a reminder letter in the mail two months in advance. If you don't receive a letter, please call our office to schedule the follow-up appointment.  

## 2014-12-07 NOTE — Assessment & Plan Note (Signed)
Continue aspirin and statin. 

## 2014-12-28 ENCOUNTER — Other Ambulatory Visit: Payer: Self-pay | Admitting: Internal Medicine

## 2015-01-30 ENCOUNTER — Other Ambulatory Visit: Payer: Self-pay | Admitting: Internal Medicine

## 2015-01-30 ENCOUNTER — Other Ambulatory Visit: Payer: Self-pay | Admitting: Cardiology

## 2015-01-30 NOTE — Telephone Encounter (Signed)
Rx request sent to pharmacy.  

## 2015-02-07 ENCOUNTER — Ambulatory Visit
Admission: RE | Admit: 2015-02-07 | Discharge: 2015-02-07 | Disposition: A | Discharge status: Home / Self Care | Payer: Commercial Managed Care - HMO | Source: Ambulatory Visit | Attending: Radiation Oncology | Admitting: Radiation Oncology

## 2015-02-07 ENCOUNTER — Telehealth: Payer: Self-pay | Admitting: *Deleted

## 2015-02-07 ENCOUNTER — Encounter: Payer: Self-pay | Admitting: Radiation Oncology

## 2015-02-07 VITALS — BP 149/49 | HR 70 | Temp 97.9°F | Resp 16 | Wt 98.1 lb

## 2015-02-07 DIAGNOSIS — C3492 Malignant neoplasm of unspecified part of left bronchus or lung: Secondary | ICD-10-CM

## 2015-02-07 NOTE — Progress Notes (Addendum)
   Department of Radiation Oncology  Phone:  [phone] Fax:        [phone]   Name: KALEEAH GINGERICH MRN: [id number]  DOB: 1940/06/19  Date: 02/07/2015  Follow Up Visit Note  Diagnosis: Non-small cell cancer of right lung   Staging form: Lung, AJCC 7th Edition     Clinical stage from 05/10/2014: Stage IA (T1b, N0, M0) - Signed by Curt Bears, MD on 05/13/2014  Summary and Interval since last radiation: 06/19/2014, 06/21/2014, 06/26/2014, 06/28/2014, 07/03/2014 Site/dose:  right lung/ 50 Gy in 5 fractions at 10 Gy per fraction  Interval History: Katrenia presents today for routine followup. She saw Dr. Julien Nordmann in October and had a scan at that time showing no evidence of disease. She has a scan scheduled in 4 months with a follow up with Dr. Julien Nordmann then.  She denies pain or dysphagia. She occasionally has a productive could and the phlegm is white in color. She reports it is due to seasonal allergies. She continues to work.  Physical Exam:  Filed Vitals:   02/07/15 1251  BP: 149/49  Pulse: 70  Temp: 97.9 F (36.6 C)  TempSrc: Oral  Resp: 16  Weight: 98 lb 1.6 oz (44.498 kg)  SpO2: 98%  Pleasant woman who is alert and oriented x3. In no respiratory distress.  IMPRESSION: Galilea is a 74 y.o. female with Stage 1 NSCLC with no evidence of disease.  PLAN:  She would prefer to follow with me. I will schedule her to follow up with myself and a CT scan in March. She has refused the flu shot, but has received the pneumovax.  Thea Silversmith, MD  This document serves as a record of services personally performed by Thea Silversmith, MD. It was created on her behalf by Darcus Austin, a trained medical scribe. The creation of this record is based on the scribe's personal observations and the provider's statements to them. This document has been checked and approved by the attending provider.

## 2015-02-07 NOTE — Telephone Encounter (Signed)
CALLED PATIENT TO INFORM OF CT AND FU, LVM FOR A RETURN CALL

## 2015-02-07 NOTE — Progress Notes (Signed)
PAIN: She is currently in no pain.  RESPIRATORY: Coughing occasionally Productive and Color of Phlegm  white. She reports it is due to seasonal allergies. Pt is on room air. SKIN: Pt reports skin is warm dry and intact2. SWALLOWING/DIET: Pt denies dysphagia.  OTHER: Pt is a Therapist, sports still continues to work. BP 149/49 mmHg  Pulse 70  Temp(Src) 97.9 F (36.6 C) (Oral)  Resp 16  Wt 98 lb 1.6 oz (44.498 kg)  SpO2 98% Wt Readings from Last 3 Encounters:  02/07/15 98 lb 1.6 oz (44.498 kg)  12/07/14 93 lb (42.185 kg)  11/28/14 96 lb 11.2 oz (43.863 kg)   Last Med Appt: Mohamed 11/28/14  Last CT:11/21/14, next is schedule 05/2015

## 2015-03-20 DIAGNOSIS — Z8551 Personal history of malignant neoplasm of bladder: Secondary | ICD-10-CM | POA: Diagnosis not present

## 2015-03-20 DIAGNOSIS — Z0000 Encounter for general adult medical examination without abnormal findings: Secondary | ICD-10-CM | POA: Diagnosis not present

## 2015-03-27 ENCOUNTER — Encounter: Payer: Self-pay | Admitting: Vascular Surgery

## 2015-04-02 ENCOUNTER — Other Ambulatory Visit: Payer: Self-pay | Admitting: *Deleted

## 2015-04-02 ENCOUNTER — Encounter: Payer: Self-pay | Admitting: Vascular Surgery

## 2015-04-02 ENCOUNTER — Ambulatory Visit (INDEPENDENT_AMBULATORY_CARE_PROVIDER_SITE_OTHER): Payer: PPO | Admitting: Vascular Surgery

## 2015-04-02 ENCOUNTER — Ambulatory Visit (HOSPITAL_COMMUNITY)
Admission: RE | Admit: 2015-04-02 | Discharge: 2015-04-02 | Disposition: A | Discharge status: Home / Self Care | Payer: PPO | Source: Ambulatory Visit | Attending: Vascular Surgery | Admitting: Vascular Surgery

## 2015-04-02 VITALS — BP 151/56 | HR 61 | Ht 62.0 in | Wt 95.0 lb

## 2015-04-02 DIAGNOSIS — E785 Hyperlipidemia, unspecified: Secondary | ICD-10-CM | POA: Diagnosis not present

## 2015-04-02 DIAGNOSIS — E119 Type 2 diabetes mellitus without complications: Secondary | ICD-10-CM | POA: Insufficient documentation

## 2015-04-02 DIAGNOSIS — Z9889 Other specified postprocedural states: Secondary | ICD-10-CM

## 2015-04-02 DIAGNOSIS — I6529 Occlusion and stenosis of unspecified carotid artery: Secondary | ICD-10-CM

## 2015-04-02 DIAGNOSIS — I6523 Occlusion and stenosis of bilateral carotid arteries: Secondary | ICD-10-CM

## 2015-04-02 NOTE — Progress Notes (Signed)
History of Present Illness:  Patient is a 75 y.o. year old female who presents for evaluation of Bilateral carotids. She is status post right carotid endarterectomy in September 2014 by myself.  She is asymptomatic without recent TIA or stroke.   She has completed her radiatio treatment for the lung cancer and is now clear.   Other medical problems include has Preventative health care; Allergic rhinitis, cause unspecified; Positive TB test; Former smoker; COPD (chronic obstructive pulmonary disease) ([place]); Diabetes ([place]); Weight loss; Dysuria; Peripheral edema; Solitary pulmonary nodule; Hypotension, unspecified; Bilateral carotid artery disease ([place]); Occlusion and stenosis of carotid artery without mention of cerebral infarction; CAD (coronary artery disease); Cardiomyopathy, ischemic; Peripheral vascular disease ([place]); Bladder cancer ([place]); Chronic systolic heart failure ([place]); Essential hypertension; Carotid stenosis; ACS (acute coronary syndrome) ([place]); Shortness of breath; Chronic systolic CHF (congestive heart failure) ([place]); Non-small cell cancer of left lung [facility]); and Chest pain on her problem list.  Past Medical History  Diagnosis Date  . Positive TB test   . Bladder tumor   . COPD (chronic obstructive pulmonary disease) ([place])   . Diabetes mellitus ([place]) TYPE II--  PER PCP NOTE DR Cathlean Cower    PER PT DOES NOT TAKES METFORMIN PRESCRIBED, SHE WATCHES DIET AND EXERCISES  . Cancer ([place])     bladder  . H/O hiatal hernia     s/p  75 yrs old  . Chronic systolic CHF (congestive heart failure) ([place]) 08/04/2012    Echo (08/17/12): EF 30%, diffuse HK, mild MR, mild BAE, PASP.;  Echo (01/2013): EF 40-45%, inferior posterior HK, Gr 2 DD, mild MR, mild LAE  . Ischemic cardiomyopathy   . CAD (coronary artery disease)     a. LHC (10/07/12):  Ostial LM 60%, distal LM 40-50%, oLAD 40-50%, mLAD 70-80%, pCFX subtotally occluded, mCFX 80-90%, oRCA occluded, ostial L subclavian 40%, EF 25-30%,  abdominal aorta diffusely diseased with severe ulcerated plaquing (right iliac ostial 95%; left renal long 60-70%, right okay). CO 2.1, CI 1.5 => s/p CABG (L-LAD, S-OM, S-PDA) 10/2012 with Dr. Cyndia Bent   . Bilateral carotid artery disease ([place]) 09/26/2012    carotid US (8/14):  R 80-99; L 60-79 => s/p R CEA 10/2012 (Dr. Donnetta Hutching)  . Lung nodule     chest CT 09/2012 => needs repeat by 03/2013  . PAD (peripheral artery disease) ([place])   . Hyperlipidemia   . Hyperkalemia     ACEI d/c'd 11/2012  . Bladder cancer ([place]) 04/26/2013  . Radiation 4/26,4/28,5/3,5/5,5/10    Left lung 5 fractions at 10 Gy    Past Surgical History  Procedure Laterality Date  . Tonsillectomy  1959  . Hiatal hernia repair  1964  (AGE 23)    CHOLECYSTECTOMY AND APPENDECTOMY  . Left elbow surgery    . Lumbar disc surgery  1993  . Abdominal hysterectomy  1981 (APPROX)    BILATERAL SALPINGO-OOPHORECTOMY WITH EXCEPTION A SMALL PIECE OF OVARY REMAINS  . Transurethral resection of bladder tumor N/A 06/24/2012    Procedure: TRANSURETHRAL RESECTION OF BLADDER TUMOR  WITH GYRUS AND INSTILLATION OF MITOMYCIN C  (TURBT);  Surgeon: Claybon Jabs, MD;  Location: [facility];  Service: Urology;  Laterality: N/A;  . Appendectomy    . Wrist surgery Left   . Cholecystectomy    . Tonsillectomy    . Cardiac catheterization    . Hammer toe surgery Left   . Coronary artery bypass graft N/A 11/14/2012    Procedure:  CORONARY ARTERY BYPASS GRAFTING (CABG) times three using left internal mammary artery and left saphenous vein;  Surgeon: Gaye Pollack, MD; L-LAD, S-OM, S-PDA   . Intraoperative transesophageal echocardiogram N/A 11/14/2012    Procedure: INTRAOPERATIVE TRANSESOPHAGEAL ECHOCARDIOGRAM;  Surgeon: Gaye Pollack, MD;  Location: [facility] OR;  Service: Open Heart Surgery;  Laterality: N/A;  . Endarterectomy Right 11/14/2012    Procedure: RIGHT ENDARTERECTOMY CAROTID with patch angioplasty.;  Surgeon: Rosetta Posner, MD;  Location: [facility] OR;   Service: Vascular;  Laterality: Right;    Social History Social History  Substance Use Topics  . Smoking status: Former Smoker -- 1.00 packs/day for 50 years    Types: Cigarettes    Quit date: 04/23/2013  . Smokeless tobacco: Never Used  . Alcohol Use: No     Comment: occasional wine not often    Family History Family History  Problem Relation Age of Onset  . Cancer Other     colon cancer  . Hypertension Other   . Kidney disease Other   . Diabetes Other   . Heart disease Mother     MI at age 5  . Cervical cancer Mother   . Cancer Mother   . Diabetes Mother   . Hypertension Mother   . Lung cancer Father 83    primary site lung CA then colon and bone  . Colon cancer Father   . Cancer Father   . Hypertension Father     Allergies  No Known Allergies   Current Outpatient Prescriptions  Medication Sig Dispense Refill  . ACCU-CHEK FASTCLIX LANCETS MISC Use to help check blood sugars four times a day Dx E11.9 408 each 3  . aspirin EC 325 MG EC tablet Take 1 tablet (325 mg total) by mouth daily. 30 tablet 0  . Blood Glucose Monitoring Suppl (ACCU-CHEK NANO SMARTVIEW) W/DEVICE KIT Use to check blood sugars daily Dx E11.9 1 kit 0  . carvedilol (COREG) 12.5 MG tablet TAKE 1 AND 1/2 TABLETS TWICE DAILY WITH A MEAL 270 tablet 2  . emollient (BIAFINE) cream Apply topically 2 (two) times daily.    . furosemide (LASIX) 20 MG tablet TAKE 1 TABLET EVERY DAY 90 tablet 3  . glucose blood (ACCU-CHEK SMARTVIEW) test strip Use to check blood sugars four times a day Dx E11.9 400 each 3  . hydrALAZINE (APRESOLINE) 25 MG tablet TAKE 1 TABLET THREE TIMES DAILY 270 tablet 2  . Hypromellose (ARTIFICIAL TEARS OP) Place 1 drop into both eyes 4 (four) times daily as needed (for dry eyes).    . isosorbide mononitrate (IMDUR) 30 MG 24 hr tablet TAKE 1 TABLET EVERY DAY 90 tablet 3  . metFORMIN (GLUCOPHAGE) 500 MG tablet TAKE 1 TABLET TWICE DAILY WITH A MEAL 180 tablet 2  . Multiple Vitamin  (MULTIVITAMIN) tablet Take 1 tablet by mouth daily.    . Omega-3 Fatty Acids (FISH OIL) 1000 MG CAPS Take 1,000 mg by mouth daily.     . pravastatin (PRAVACHOL) 40 MG tablet TAKE 1 TABLET EVERY EVENING 90 tablet 3   No current facility-administered medications for this visit.    ROS:   General:  No weight loss, Fever, chills  HEENT: No recent headaches, no nasal bleeding, no visual changes, no sore throat  Neurologic: No dizziness, blackouts, seizures. No recent symptoms of stroke or mini- stroke. No recent episodes of slurred speech, or temporary blindness.  Cardiac: No recent episodes of chest pain/pressure, no shortness of breath at rest.  No  shortness of breath with exertion.  Denies history of atrial fibrillation or irregular heartbeat  Vascular: No history of rest pain in feet.  No history of claudication.  No history of non-healing ulcer, No history of DVT   Pulmonary: No home oxygen, no productive cough, no hemoptysis,  No asthma or wheezing  Musculoskeletal:  _0  Arthritis, _1  Low back pain,  _2  Joint pain  Hematologic:No history of hypercoagulable state.  No history of easy bleeding.  No history of anemia  Gastrointestinal: No hematochezia or melena,  No gastroesophageal reflux, no trouble swallowing  Urinary: _3  chronic Kidney disease, _4  on HD - _5  MWF or _6  TTHS, _7  Burning with urination, _8  Frequent urination, _9  Difficulty urinating;   Skin: No rashes  Psychological: No history of anxiety,  No history of depression   Physical Examination  Filed Vitals:   04/02/15 1459  Height: _10  (1.575 m)  Weight: 95 lb (43.092 kg)    Body mass index is 17.37 kg/(m^2).  General:  Alert and oriented, no acute distress HEENT: Normal Neck: No bruit or JVD Pulmonary: Clear to auscultation bilaterally Cardiac: Regular Rate and Rhythm without murmur Abdomen: Soft, non-tender, non-distended, no mass, no scars Skin: No rash Extremity Pulses:  2+ radial,  brachial, femoral, dorsalis pedis, posterior tibial pulses bilaterally Musculoskeletal: No deformity or edema  Neurologic: Upper and lower extremity motor 5/5 and symmetric  DATA:   Right carotid is patent with narrowing in the common carotid velocity 211 cm/s  Left 60-70% with out change ICA end diastolic velocity is unchanged from previous study 96 cm/s, peak systolic 379 cm/s     ASSESSMENT:   Asymptomatic carotid stenosis bilateral s/p right CEA 2014   PLAN:   Repeat carotid duplex in 6 months.  She under stands that if she crosses the threshold of 80% stenosis in the left ICA she will proceed with  Left CEA.  She agrees to this plan.    She will have a follow up CTA of the chest in March.    Theda Sers, Annalisse Minkoff [facility] PA-C [facility] [place] Office: [phone]  She was seen in conjunction with Dr. Donnetta Hutching today  I have examined the patient, reviewed and agree with above.  Curt Jews, MD 04/02/2015 4:05 PM

## 2015-04-17 ENCOUNTER — Telehealth: Payer: Self-pay | Admitting: *Deleted

## 2015-04-17 MED ORDER — METFORMIN HCL 500 MG PO TABS: ORAL_TABLET | ORAL | Status: DC

## 2015-04-17 MED ORDER — FUROSEMIDE 20 MG PO TABS: 20.0000 mg | ORAL_TABLET | Freq: Every day | ORAL | Status: DC

## 2015-04-17 NOTE — Telephone Encounter (Signed)
Receive call pt states she has change insurance to [facility], and needing her metformin & Lasix sent to new pharmacy. Not sure name of mail service , but their # [phone] & fax # [phone]. Called phone number [facility]. Sent rx electronically...Johny Chess

## 2015-04-18 ENCOUNTER — Telehealth: Payer: Self-pay | Admitting: Cardiology

## 2015-04-18 ENCOUNTER — Telehealth: Payer: Self-pay | Admitting: *Deleted

## 2015-04-18 MED ORDER — PRAVASTATIN SODIUM 40 MG PO TABS: 40.0000 mg | ORAL_TABLET | Freq: Every evening | ORAL | Status: DC

## 2015-04-18 MED ORDER — ISOSORBIDE MONONITRATE ER 30 MG PO TB24: 30.0000 mg | ORAL_TABLET | Freq: Every day | ORAL | Status: DC

## 2015-04-18 MED ORDER — CARVEDILOL 12.5 MG PO TABS: ORAL_TABLET | ORAL | Status: DC

## 2015-04-18 MED ORDER — HYDRALAZINE HCL 25 MG PO TABS: 25.0000 mg | ORAL_TABLET | Freq: Three times a day (TID) | ORAL | Status: DC

## 2015-04-18 NOTE — Telephone Encounter (Signed)
Rx(s) sent to pharmacy electronically.  

## 2015-04-18 NOTE — Telephone Encounter (Signed)
Left message for patient to call----I was returning her call from this morning.

## 2015-04-18 NOTE — Telephone Encounter (Signed)
Patient's mail order pharmacy has changed to [facility] # [id number] # [phone] needs refills on Isosorbide Mononitrate 30 mg --1 daily---Pravastatin 40 mg 1 Daily (patient only has 1 week left of this medication)--Carvedilol 12.5 mg 1 1/2 tab BID and Hydralazine 25 mg 1 tid----Patient needs 90 day supply of all of these medications with 3 refills.

## 2015-05-03 NOTE — Progress Notes (Signed)
  Weight changes, if any:  Wt Readings from Last 3 Encounters:  05/09/15 100 lb 4.8 oz (45.496 kg)  05/07/15 99 lb (44.906 kg)  04/02/15 95 lb (43.092 kg)    Respiratory complaints, if any: Denies SOB,coughs when her sinus drains only  nonproductive, O2 sat 100%  Hemoptysis, if any: No  Pain issues, if any:  No  Swallowing Problems/Pain/Difficulty swallowing:No  Smoking Tobacco/Marijuana/Snuff/ETOH use:No Temp(Src) 97.7 F (36.5 C) (Oral)  Ht '5\' 2"'$  (1.575 m)  Wt 100 lb 4.8 oz (45.496 kg)  BMI 18.34 kg/m2

## 2015-05-07 ENCOUNTER — Encounter: Payer: Self-pay | Admitting: Internal Medicine

## 2015-05-07 ENCOUNTER — Telehealth: Payer: Self-pay

## 2015-05-07 ENCOUNTER — Ambulatory Visit (INDEPENDENT_AMBULATORY_CARE_PROVIDER_SITE_OTHER): Payer: PPO | Admitting: Internal Medicine

## 2015-05-07 ENCOUNTER — Other Ambulatory Visit (INDEPENDENT_AMBULATORY_CARE_PROVIDER_SITE_OTHER): Payer: PPO

## 2015-05-07 VITALS — BP 130/60 | HR 75 | Temp 98.5°F | Resp 20 | Wt 99.0 lb

## 2015-05-07 DIAGNOSIS — E119 Type 2 diabetes mellitus without complications: Secondary | ICD-10-CM | POA: Diagnosis not present

## 2015-05-07 DIAGNOSIS — I5022 Chronic systolic (congestive) heart failure: Secondary | ICD-10-CM

## 2015-05-07 DIAGNOSIS — I10 Essential (primary) hypertension: Secondary | ICD-10-CM

## 2015-05-07 DIAGNOSIS — J438 Other emphysema: Secondary | ICD-10-CM | POA: Diagnosis not present

## 2015-05-07 DIAGNOSIS — Z0000 Encounter for general adult medical examination without abnormal findings: Secondary | ICD-10-CM

## 2015-05-07 LAB — BASIC METABOLIC PANEL
BUN: 17 mg/dL (ref 6–23)
CALCIUM: 9.5 mg/dL (ref 8.4–10.5)
CHLORIDE: 102 meq/L (ref 96–112)
CO2: 28 mEq/L (ref 19–32)
CREATININE: 0.75 mg/dL (ref 0.40–1.20)
GFR: 79.93 mL/min (ref >60.00)
Glucose, Bld: 154 mg/dL — ABNORMAL HIGH (ref 70–99)
Potassium: 4.1 mEq/L (ref 3.5–5.1)
SODIUM: 139 meq/L (ref 135–145)

## 2015-05-07 LAB — HEPATIC FUNCTION PANEL
ALT: 15 U/L (ref 0–35)
AST: 26 U/L (ref 0–37)
Albumin: 3.8 g/dL (ref 3.5–5.2)
Alkaline Phosphatase: 80 U/L (ref 39–117)
BILIRUBIN DIRECT: 0.1 mg/dL (ref 0.0–0.3)
BILIRUBIN TOTAL: 0.3 mg/dL (ref 0.2–1.2)
Total Protein: 7.1 g/dL (ref 6.0–8.3)

## 2015-05-07 LAB — URINALYSIS, ROUTINE W REFLEX MICROSCOPIC
Bilirubin Urine: NEGATIVE
Hgb urine dipstick: NEGATIVE
Ketones, ur: NEGATIVE
Leukocytes, UA: NEGATIVE
Nitrite: NEGATIVE
PH: 6 (ref 5.0–8.0)
RBC / HPF: NONE SEEN (ref 0–?)
SPECIFIC GRAVITY, URINE: 1.01 (ref 1.000–1.030)
Urine Glucose: NEGATIVE
Urobilinogen, UA: 0.2 (ref 0.0–1.0)
WBC UA: NONE SEEN (ref 0–?)

## 2015-05-07 LAB — CBC WITH DIFFERENTIAL/PLATELET
BASOS ABS: 0 10*3/uL (ref 0.0–0.1)
BASOS PCT: 0.4 % (ref 0.0–3.0)
EOS ABS: 0.3 10*3/uL (ref 0.0–0.7)
Eosinophils Relative: 2.7 % (ref 0.0–5.0)
HEMATOCRIT: 34.1 % — AB (ref 36.0–46.0)
Hemoglobin: 11.5 g/dL — ABNORMAL LOW (ref 12.0–15.0)
LYMPHS ABS: 2.6 10*3/uL (ref 0.7–4.0)
LYMPHS PCT: 21.1 % (ref 12.0–46.0)
MCHC: 33.6 g/dL (ref 30.0–36.0)
MCV: 93 fl (ref 78.0–100.0)
Monocytes Absolute: 1 10*3/uL (ref 0.1–1.0)
Monocytes Relative: 8.3 % (ref 3.0–12.0)
NEUTROS ABS: 8.3 10*3/uL — AB (ref 1.4–7.7)
NEUTROS PCT: 67.5 % (ref 43.0–77.0)
PLATELETS: 252 10*3/uL (ref 150.0–400.0)
RBC: 3.67 Mil/uL — ABNORMAL LOW (ref 3.87–5.11)
RDW: 15.1 % (ref 11.5–15.5)
WBC: 12.4 10*3/uL — ABNORMAL HIGH (ref 4.0–10.5)

## 2015-05-07 LAB — MICROALBUMIN / CREATININE URINE RATIO
CREATININE, U: 18.2 mg/dL
MICROALB UR: 17.9 mg/dL — AB (ref 0.0–1.9)
Microalb Creat Ratio: 98.4 mg/g — ABNORMAL HIGH (ref 0.0–30.0)

## 2015-05-07 LAB — HEMOGLOBIN A1C: HEMOGLOBIN A1C: 7 % — AB (ref 4.6–6.5)

## 2015-05-07 LAB — TSH: TSH: 3.49 u[IU]/mL (ref 0.35–4.50)

## 2015-05-07 LAB — LIPID PANEL
CHOL/HDL RATIO: 2
Cholesterol: 133 mg/dL (ref 0–200)
HDL: 61.1 mg/dL (ref >39.00)
LDL Cholesterol: 54 mg/dL (ref 0–99)
NONHDL: 72.36
Triglycerides: 94 mg/dL (ref 0.0–149.0)
VLDL: 18.8 mg/dL (ref 0.0–40.0)

## 2015-05-07 MED ORDER — GLUCOSE BLOOD VI STRP: ORAL_STRIP | Status: DC

## 2015-05-07 MED ORDER — ACCU-CHEK FASTCLIX LANCETS MISC: Status: DC

## 2015-05-07 NOTE — Progress Notes (Signed)
Pre visit review using our clinic review tool, if applicable. No additional management support is needed unless otherwise documented below in the visit note. 

## 2015-05-07 NOTE — Progress Notes (Signed)
Subjective:    Patient ID: Tonya Valdez, female    DOB: Apr 02, 1940, 75 y.o.   MRN: [id number]  HPI  Here for wellness and f/u;  Overall doing ok;  Pt denies Chest pain, worsening SOB, DOE, wheezing, orthopnea, PND, worsening LE edema, palpitations, dizziness or syncope.  Pt denies neurological change such as new headache, facial or extremity weakness.  Pt denies polydipsia, polyuria, or low sugar symptoms. Pt states overall good compliance with treatment and medications, good tolerability, and has been trying to follow appropriate diet.  Pt denies worsening depressive symptoms, suicidal ideation or panic. No fever, night sweats, wt loss, loss of appetite, or other constitutional symptoms.  Pt states good ability with ADL's, has low fall risk, home safety reviewed and adequate, no other significant changes in hearing or vision, and only occasionally active with exercise.  Due for tetanus. S/p right CEA, and has left carotid stenosis, sees Dr Donnetta Hutching ever 6 mo.  Has hx of CV dz, bladder ca and lung ca but remarkably doing well, denies any pain, can do "anything I want." Past Medical History  Diagnosis Date  . Positive TB test   . Bladder tumor   . COPD (chronic obstructive pulmonary disease) ([place])   . Diabetes mellitus ([place]) TYPE II--  PER PCP NOTE DR Cathlean Cower    PER PT DOES NOT TAKES METFORMIN PRESCRIBED, SHE WATCHES DIET AND EXERCISES  . Cancer ([place])     bladder  . H/O hiatal hernia     s/p  75 yrs old  . Chronic systolic CHF (congestive heart failure) ([place]) 08/04/2012    Echo (08/17/12): EF 30%, diffuse HK, mild MR, mild BAE, PASP.;  Echo (01/2013): EF 40-45%, inferior posterior HK, Gr 2 DD, mild MR, mild LAE  . Ischemic cardiomyopathy   . CAD (coronary artery disease)     a. LHC (10/07/12):  Ostial LM 60%, distal LM 40-50%, oLAD 40-50%, mLAD 70-80%, pCFX subtotally occluded, mCFX 80-90%, oRCA occluded, ostial L subclavian 40%, EF 25-30%, abdominal aorta diffusely diseased with severe ulcerated  plaquing (right iliac ostial 95%; left renal long 60-70%, right okay). CO 2.1, CI 1.5 => s/p CABG (L-LAD, S-OM, S-PDA) 10/2012 with Dr. Cyndia Bent   . Bilateral carotid artery disease ([place]) 09/26/2012    carotid US (8/14):  R 80-99; L 60-79 => s/p R CEA 10/2012 (Dr. Donnetta Hutching)  . Lung nodule     chest CT 09/2012 => needs repeat by 03/2013  . PAD (peripheral artery disease) ([place])   . Hyperlipidemia   . Hyperkalemia     ACEI d/c'd 11/2012  . Bladder cancer ([place]) 04/26/2013  . Radiation 4/26,4/28,5/3,5/5,5/10    Left lung 5 fractions at 10 Gy   Past Surgical History  Procedure Laterality Date  . Tonsillectomy  1959  . Hiatal hernia repair  1964  (AGE 47)    CHOLECYSTECTOMY AND APPENDECTOMY  . Left elbow surgery    . Lumbar disc surgery  1993  . Abdominal hysterectomy  1981 (APPROX)    BILATERAL SALPINGO-OOPHORECTOMY WITH EXCEPTION A SMALL PIECE OF OVARY REMAINS  . Transurethral resection of bladder tumor N/A 06/24/2012    Procedure: TRANSURETHRAL RESECTION OF BLADDER TUMOR  WITH GYRUS AND INSTILLATION OF MITOMYCIN C  (TURBT);  Surgeon: Claybon Jabs, MD;  Location: [facility];  Service: Urology;  Laterality: N/A;  . Appendectomy    . Wrist surgery Left   . Cholecystectomy    . Tonsillectomy    . Cardiac catheterization    .  Hammer toe surgery Left   . Coronary artery bypass graft N/A 11/14/2012    Procedure: CORONARY ARTERY BYPASS GRAFTING (CABG) times three using left internal mammary artery and left saphenous vein;  Surgeon: Gaye Pollack, MD; L-LAD, S-OM, S-PDA   . Intraoperative transesophageal echocardiogram N/A 11/14/2012    Procedure: INTRAOPERATIVE TRANSESOPHAGEAL ECHOCARDIOGRAM;  Surgeon: Gaye Pollack, MD;  Location: [facility] OR;  Service: Open Heart Surgery;  Laterality: N/A;  . Endarterectomy Right 11/14/2012    Procedure: RIGHT ENDARTERECTOMY CAROTID with patch angioplasty.;  Surgeon: Rosetta Posner, MD;  Location: [facility];  Service: Vascular;  Laterality: Right;    reports that  she quit smoking about 2 years ago. Her smoking use included Cigarettes. She has a 50 pack-year smoking history. She has never used smokeless tobacco. She reports that she does not drink alcohol or use illicit drugs. family history includes Cancer in her father, mother, and other; Cervical cancer in her mother; Colon cancer in her father; Diabetes in her mother and other; Heart disease in her mother; Hypertension in her father, mother, and other; Kidney disease in her other; Lung cancer (age of onset: 66) in her father. No Known Allergies . Current Outpatient Prescriptions on File Prior to Visit  Medication Sig Dispense Refill  . aspirin EC 325 MG EC tablet Take 1 tablet (325 mg total) by mouth daily. 30 tablet 0  . Blood Glucose Monitoring Suppl (ACCU-CHEK NANO SMARTVIEW) W/DEVICE KIT Use to check blood sugars daily Dx E11.9 1 kit 0  . carvedilol (COREG) 12.5 MG tablet TAKE 1 AND 1/2 TABLETS BY MOUTH TWICE DAILY WITH A MEAL 270 tablet 2  . emollient (BIAFINE) cream Apply topically 2 (two) times daily.    . furosemide (LASIX) 20 MG tablet Take 1 tablet (20 mg total) by mouth daily. 90 tablet 2  . hydrALAZINE (APRESOLINE) 25 MG tablet Take 1 tablet (25 mg total) by mouth 3 (three) times daily. 270 tablet 2  . Hypromellose (ARTIFICIAL TEARS OP) Place 1 drop into both eyes 4 (four) times daily as needed (for dry eyes).    . isosorbide mononitrate (IMDUR) 30 MG 24 hr tablet Take 1 tablet (30 mg total) by mouth daily. 90 tablet 2  . metFORMIN (GLUCOPHAGE) 500 MG tablet TAKE 1 TABLET TWICE DAILY WITH A MEAL 180 tablet 2  . Multiple Vitamin (MULTIVITAMIN) tablet Take 1 tablet by mouth daily.    . Omega-3 Fatty Acids (FISH OIL) 1000 MG CAPS Take 1,000 mg by mouth daily.     . pravastatin (PRAVACHOL) 40 MG tablet Take 1 tablet (40 mg total) by mouth every evening. 90 tablet 2   No current facility-administered medications on file prior to visit.    Review of Systems Constitutional: Negative for  increased diaphoresis, other activity, appetite or siginficant weight change other than noted HENT: Negative for worsening hearing loss, ear pain, facial swelling, mouth sores and neck stiffness.   Eyes: Negative for other worsening pain, redness or visual disturbance.  Respiratory: Negative for shortness of breath and wheezing  Cardiovascular: Negative for chest pain and palpitations.  Gastrointestinal: Negative for diarrhea, blood in stool, abdominal distention or other pain Genitourinary: Negative for hematuria, flank pain or change in urine volume.  Musculoskeletal: Negative for myalgias or other joint complaints.  Skin: Negative for color change and wound or drainage.  Neurological: Negative for syncope and numbness. other than noted Hematological: Negative for adenopathy. or other swelling Psychiatric/Behavioral: Negative for hallucinations, SI, self-injury, decreased concentration or other worsening  agitation.      Objective:   Physical Exam BP 130/60 mmHg  Pulse 75  Temp(Src) 98.5 F (36.9 C) (Oral)  Resp 20  Wt 99 lb (44.906 kg)  SpO2 94% VS noted,  Constitutional: Pt is oriented to person, place, and time. Appears well-developed and well-nourished, in no significant distress Head: Normocephalic and atraumatic.  Right Ear: External ear normal.  Left Ear: External ear normal.  Nose: Nose normal.  Mouth/Throat: Oropharynx is clear and moist.  Eyes: Conjunctivae and EOM are normal. Pupils are equal, round, and reactive to light.  Neck: Normal range of motion. Neck supple. No JVD present. No tracheal deviation present or significant neck LA or mass Cardiovascular: Normal rate, regular rhythm, normal heart sounds and intact distal pulses.   Pulmonary/Chest: Effort normal and breath sounds without rales or wheezing  Abdominal: Soft. Bowel sounds are normal. NT. No HSM  Musculoskeletal: Normal range of motion. Exhibits no edema.  Lymphadenopathy:  Has no cervical adenopathy.    Neurological: Pt is alert and oriented to person, place, and time. Pt has normal reflexes. No cranial nerve deficit. Motor grossly intact Skin: Skin is warm and dry. No rash noted.  Psychiatric:  Has normal mood and affect. Behavior is normal.     Assessment & Plan:

## 2015-05-07 NOTE — Patient Instructions (Addendum)

## 2015-05-07 NOTE — Telephone Encounter (Signed)
Medications sent to pharmacy

## 2015-05-08 ENCOUNTER — Ambulatory Visit (HOSPITAL_COMMUNITY)
Admission: RE | Admit: 2015-05-08 | Discharge: 2015-05-08 | Disposition: A | Discharge status: Home / Self Care | Payer: PPO | Source: Ambulatory Visit | Attending: Radiation Oncology | Admitting: Radiation Oncology

## 2015-05-08 ENCOUNTER — Encounter (HOSPITAL_COMMUNITY): Payer: Self-pay

## 2015-05-08 DIAGNOSIS — R918 Other nonspecific abnormal finding of lung field: Secondary | ICD-10-CM | POA: Insufficient documentation

## 2015-05-08 DIAGNOSIS — C3431 Malignant neoplasm of lower lobe, right bronchus or lung: Secondary | ICD-10-CM | POA: Diagnosis not present

## 2015-05-08 DIAGNOSIS — J439 Emphysema, unspecified: Secondary | ICD-10-CM | POA: Diagnosis not present

## 2015-05-08 DIAGNOSIS — C3492 Malignant neoplasm of unspecified part of left bronchus or lung: Secondary | ICD-10-CM | POA: Diagnosis not present

## 2015-05-08 NOTE — Assessment & Plan Note (Signed)
stable overall by history and exam, recent data reviewed with pt, and pt to continue medical treatment as before,  to f/u any worsening symptoms or concerns Lab Results  Component Value Date   HGBA1C 7.0* 05/07/2015

## 2015-05-08 NOTE — Assessment & Plan Note (Signed)

## 2015-05-08 NOTE — Assessment & Plan Note (Signed)
stable overall by history and exam, recent data reviewed with pt, and pt to continue medical treatment as before,  to f/u any worsening symptoms or concerns SpO2 Readings from Last 3 Encounters:  05/07/15 94%  04/02/15 97%  02/07/15 98%

## 2015-05-08 NOTE — Assessment & Plan Note (Signed)
stable overall by history and exam, recent data reviewed with pt, and pt to continue medical treatment as before,  to f/u any worsening symptoms or concerns BP Readings from Last 3 Encounters:  05/07/15 130/60  04/02/15 151/56  02/07/15 149/49

## 2015-05-08 NOTE — Assessment & Plan Note (Signed)
stable overall by history and exam, recent data reviewed with pt, and pt to continue medical treatment as before,  to f/u any worsening symptoms or concerns Lab Results  Component Value Date   WBC 12.4* 05/07/2015   HGB 11.5* 05/07/2015   HCT 34.1* 05/07/2015   PLT 252.0 05/07/2015   GLUCOSE 154* 05/07/2015   CHOL 133 05/07/2015   TRIG 94.0 05/07/2015   HDL 61.10 05/07/2015   LDLDIRECT 127.6 10/07/2011   LDLCALC 54 05/07/2015   ALT 15 05/07/2015   AST 26 05/07/2015   NA 139 05/07/2015   K 4.1 05/07/2015   CL 102 05/07/2015   CREATININE 0.75 05/07/2015   BUN 17 05/07/2015   CO2 28 05/07/2015   TSH 3.49 05/07/2015   INR 1.01 04/26/2014   HGBA1C 7.0* 05/07/2015   MICROALBUR 17.9* 05/07/2015

## 2015-05-09 ENCOUNTER — Ambulatory Visit
Admission: RE | Admit: 2015-05-09 | Discharge: 2015-05-09 | Disposition: A | Discharge status: Home / Self Care | Payer: PPO | Source: Ambulatory Visit | Attending: Radiation Oncology | Admitting: Radiation Oncology

## 2015-05-09 VITALS — Temp 97.7°F | Ht 62.0 in | Wt 100.3 lb

## 2015-05-09 DIAGNOSIS — C3492 Malignant neoplasm of unspecified part of left bronchus or lung: Secondary | ICD-10-CM | POA: Diagnosis not present

## 2015-05-09 NOTE — Addendum Note (Signed)
Encounter addended by: Thea Silversmith, MD on: 05/09/2015  1:28 PM<BR>     Documentation filed: Notes Section

## 2015-05-09 NOTE — Addendum Note (Signed)
Encounter addended by: Thea Silversmith, MD on: 05/09/2015  1:27 PM<BR>     Documentation filed: Notes Section

## 2015-05-09 NOTE — Progress Notes (Signed)
   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 She had a CT scan yesterday which showed a decrease in size of the right lower lung lobe lesion. Unfortunately, multiple scattered pulmonary nodules, largest at 5 mm, were worrisome for metastatic disease. A follow up CT of 6 weeks was recommended. She is feeling well and is doing well. She still works 3 nights a week.   Weight changes, if any:   Wt Readings from Last 3 Encounters:   05/09/15  100 lb 4.8 oz (45.496 kg)   05/07/15  99 lb (44.906 kg)   04/02/15  95 lb (43.092 kg)    Respiratory complaints, if any: Denies SOB,coughs when her sinus drains only nonproductive, O2 sat 100%  Hemoptysis, if any: No  Pain issues, if any: No  Swallowing Problems/Pain/Difficulty swallowing:No  Smoking Tobacco/Marijuana/Snuff/ETOH use:No Temp(Src) 97.7 F (36.5 C) (Oral)  Ht '5\' 2"'$  (1.575 m)  Wt 100 lb 4.8 oz (45.496 kg)  BMI 18.34 kg/m2   Physical Exam:  Filed Vitals:   05/09/15 1314  Temp: 97.7 F (36.5 C)  TempSrc: Oral  Height: '5\' 2"'$  (1.575 m)  Weight: 100 lb 4.8 oz (45.496 kg)  Pleasant woman who is alert and oriented x3. In no respiratory distress. Skin is clear, no radiation changes.   IMPRESSION: Tonya Valdez is a 75 y.o. 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 Dr.Early in August. She is going on a trip to [place]. Works nights Monday,Tuesday, Wednesday.   ------------------------------------------------  Thea Silversmith, MD  This document serves as a record of services personally performed by Thea Silversmith, MD. It was created on her behalf by Derek Mound, a trained medical scribe. The creation of this record is based on the scribe's personal observations and the provider's statements to them. This document has been checked and approved by the attending provider.

## 2015-05-15 ENCOUNTER — Telehealth: Payer: Self-pay | Admitting: *Deleted

## 2015-05-15 MED ORDER — ONETOUCH ULTRASOFT LANCETS MISC: 1.0000 | Freq: Two times a day (BID) | Status: DC

## 2015-05-15 MED ORDER — ONETOUCH ULTRA 2 W/DEVICE KIT: PACK | Status: DC

## 2015-05-15 MED ORDER — GLUCOSE BLOOD VI STRP: 1.0000 | ORAL_STRIP | Freq: Two times a day (BID) | Status: DC

## 2015-05-15 NOTE — Telephone Encounter (Signed)
Left msg on triage stating pt insurance does not cover accu chek monitor/supplies. Requesting to be change to One Touch ultra, Freestyle, and Precision. Sending new scripts for One Touch Ultra...Tonya Valdez

## 2015-05-22 ENCOUNTER — Other Ambulatory Visit: Payer: Self-pay | Admitting: Internal Medicine

## 2015-06-14 NOTE — Progress Notes (Signed)
Tonya Valdez is here today for a 6 week follow up visit for non-small cell cancer of the right lower lobe lung.  Weight changes, if any:  Wt Readings from Last 3 Encounters:  06/20/15 99 lb (44.906 kg)  05/09/15 100 lb 4.8 oz (45.496 kg)  05/07/15 99 lb (44.906 kg)   Respiratory complaints, if any: Denies SOB, coughing only with sinus drainage Hemoptysis, if any:No   Swallowing Problems/Pain/Difficulty swallowing:None Smoking Tobacco/Marijuana/Snuff/ETOH use:1 pack/day  Quit 04-23-13 Skin:Normal Pain :No  Appetite:Good Energy level:Denies fatigue working 40 hours a week. BP 171/34 mmHg  Pulse 63  Temp(Src) 98.3 F (36.8 C)  Resp 16  Ht '5\' 2"'$  (1.575 m)  Wt 99 lb (44.906 kg)  BMI 18.10 kg/m2  SpO2 97% When is next chemo scheduled?:None Lab work from of chart:05-07-15

## 2015-06-16 ENCOUNTER — Encounter: Payer: Self-pay | Admitting: Radiation Oncology

## 2015-06-19 ENCOUNTER — Encounter (HOSPITAL_COMMUNITY): Payer: Self-pay

## 2015-06-19 ENCOUNTER — Ambulatory Visit (HOSPITAL_COMMUNITY)
Admission: RE | Admit: 2015-06-19 | Discharge: 2015-06-19 | Disposition: A | Discharge status: Home / Self Care | Payer: PPO | Source: Ambulatory Visit | Attending: Radiation Oncology | Admitting: Radiation Oncology

## 2015-06-19 DIAGNOSIS — R590 Localized enlarged lymph nodes: Secondary | ICD-10-CM | POA: Insufficient documentation

## 2015-06-19 DIAGNOSIS — J439 Emphysema, unspecified: Secondary | ICD-10-CM | POA: Diagnosis not present

## 2015-06-19 DIAGNOSIS — C3492 Malignant neoplasm of unspecified part of left bronchus or lung: Secondary | ICD-10-CM | POA: Insufficient documentation

## 2015-06-20 ENCOUNTER — Ambulatory Visit
Admission: RE | Admit: 2015-06-20 | Discharge: 2015-06-20 | Disposition: A | Discharge status: Home / Self Care | Payer: PPO | Source: Ambulatory Visit | Attending: Radiation Oncology | Admitting: Radiation Oncology

## 2015-06-20 ENCOUNTER — Telehealth: Payer: Self-pay | Admitting: *Deleted

## 2015-06-20 ENCOUNTER — Encounter: Payer: Self-pay | Admitting: Radiation Oncology

## 2015-06-20 VITALS — BP 171/34 | HR 63 | Temp 98.3°F | Resp 16 | Ht 62.0 in | Wt 99.0 lb

## 2015-06-20 DIAGNOSIS — C3492 Malignant neoplasm of unspecified part of left bronchus or lung: Secondary | ICD-10-CM | POA: Diagnosis not present

## 2015-06-20 HISTORY — DX: Malignant neoplasm of unspecified part of unspecified bronchus or lung: C34.90

## 2015-06-20 NOTE — Telephone Encounter (Signed)
CALLED PATIENT TO INFORM OF CT ON 10-02-15 - ARRIVAL TIME - 1:15 PM @ [facility], NO RESTRICTIONS AND HER FU WITH DR. Pablo Ledger FOR RESULTS ON 10-03-15 @ 1 PM, SPOKE WITH PATIENT AND SHE IS AWARE OF THESE APPTS.

## 2015-06-20 NOTE — Progress Notes (Signed)
   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 Unchanged bilateral pulmonary nodules. Recommended 3-4 months  follow up chest CT. She is feeling well with no complaints. Ready to head to [place] to see her sister.   Weight changes, if any:  Wt Readings from Last 3 Encounters:  06/20/15 99 lb (44.906 kg)  05/09/15 100 lb 4.8 oz (45.496 kg)  05/07/15 99 lb (44.906 kg)   Respiratory complaints, if any: Denies SOB, coughing only with sinus drainage Hemoptysis, if any:No   Swallowing Problems/Pain/Difficulty swallowing:None Smoking Tobacco/Marijuana/Snuff/ETOH use:1 pack/day  Quit 04-23-13 Skin:Normal Pain :No  Appetite:Good Energy level:Denies fatigue working 40 hours a week. When is next chemo scheduled?:None Lab work from of chart:05-07-15    Physical Exam:  Filed Vitals:   06/20/15 1425  BP: 171/34  Pulse: 63  Temp: 98.3 F (36.8 C)  Resp: 16  Height: '5\' 2"'$  (1.575 m)  Weight: 99 lb (44.906 kg)  SpO2: 97%    Pleasant woman who is alert and oriented x3. In no respiratory distress.   IMPRESSION: Tonya Valdez is a 75 y.o. female with Stage 1 NSCLC of the right lower lobe.  PLAN:  Follow up in 3-4 months with repeat CT w/o in afternoon.  ------------------------------------------------  Thea Silversmith, MD  This document serves as a record of  services personally performed by Thea Silversmith, MD. It was created on her behalf by Derek Mound, a trained medical scribe. The creation of this record is based on the scribe's personal observations and the provider's statements to them. This document has been checked and approved by the attending provider.

## 2015-07-03 ENCOUNTER — Telehealth: Payer: Self-pay | Admitting: *Deleted

## 2015-07-03 MED ORDER — GLUCOSE BLOOD VI STRP: 1.0000 | ORAL_STRIP | Freq: Four times a day (QID) | Status: DC

## 2015-07-03 MED ORDER — ONETOUCH ULTRASOFT LANCETS MISC: 1.0000 | Freq: Four times a day (QID) | Status: AC

## 2015-07-03 NOTE — Telephone Encounter (Signed)
Left msg on triage stating needing to verify how many times pt is test blood sugars. Pt states she checks BS four times a day. If correct pls send updated script or call and ok change.Sent updated script for 4 x's a day.Marland KitchenJohny Chess

## 2015-09-19 DIAGNOSIS — Z8551 Personal history of malignant neoplasm of bladder: Secondary | ICD-10-CM | POA: Diagnosis not present

## 2015-09-23 ENCOUNTER — Telehealth: Payer: Self-pay | Admitting: Internal Medicine

## 2015-09-23 MED ORDER — ONETOUCH ULTRA 2 W/DEVICE KIT: PACK | 0 refills | Status: DC

## 2015-09-23 NOTE — Addendum Note (Signed)
Addended by: Biagio Borg on: 09/23/2015 08:54 PM   Modules accepted: Orders

## 2015-09-23 NOTE — Telephone Encounter (Signed)
Done hardcopy to Corinne, as it was not accepted erx to her pharmacy of record

## 2015-09-23 NOTE — Telephone Encounter (Signed)
Pt called in and needs a new 1 touch ultra 2 meter sent to:  Address: [address] [place], [place] [address] Phone number-[phone]  As soon as possible, he meter is not working

## 2015-09-24 NOTE — Telephone Encounter (Signed)
Prescription faxed to pharmacy.

## 2015-09-25 ENCOUNTER — Telehealth: Payer: Self-pay | Admitting: Internal Medicine

## 2015-09-25 NOTE — Telephone Encounter (Signed)
Pt called again asking about her one touch Ultra 2 meter. She is asking for it to be faxed to [facility] . Fax # is [phone]. Please follow up thanks.

## 2015-09-25 NOTE — Telephone Encounter (Signed)
Patient states that pharmacy did not receive One Touch Ultra.  Please resend to [facility] [place].

## 2015-09-30 ENCOUNTER — Telehealth: Payer: Self-pay

## 2015-09-30 MED ORDER — ONETOUCH ULTRA 2 W/DEVICE KIT: PACK | 0 refills | Status: DC

## 2015-09-30 NOTE — Telephone Encounter (Signed)
Looks like this got sent to mail order?  Can we route this to [facility]?  Please follow up with patient once sent.

## 2015-09-30 NOTE — Telephone Encounter (Signed)
error 

## 2015-09-30 NOTE — Telephone Encounter (Signed)
Sent to [facility] on [address]

## 2015-09-30 NOTE — Telephone Encounter (Signed)
Sent to [facility] on [address]---tried to reach patient to let her know, phone just rings and rings, no way to leave message----med shows "receipt confirmed by pharm" so her pharm should have this

## 2015-10-02 ENCOUNTER — Ambulatory Visit (HOSPITAL_COMMUNITY)
Admission: RE | Admit: 2015-10-02 | Discharge: 2015-10-02 | Disposition: A | Discharge status: Home / Self Care | Payer: PPO | Source: Ambulatory Visit | Attending: Radiation Oncology | Admitting: Radiation Oncology

## 2015-10-02 DIAGNOSIS — C3492 Malignant neoplasm of unspecified part of left bronchus or lung: Secondary | ICD-10-CM | POA: Insufficient documentation

## 2015-10-02 DIAGNOSIS — I700 Atherosclerosis of aorta: Secondary | ICD-10-CM | POA: Diagnosis not present

## 2015-10-02 DIAGNOSIS — Z951 Presence of aortocoronary bypass graft: Secondary | ICD-10-CM | POA: Diagnosis not present

## 2015-10-02 DIAGNOSIS — D71 Functional disorders of polymorphonuclear neutrophils: Secondary | ICD-10-CM | POA: Diagnosis not present

## 2015-10-02 DIAGNOSIS — R918 Other nonspecific abnormal finding of lung field: Secondary | ICD-10-CM | POA: Insufficient documentation

## 2015-10-02 DIAGNOSIS — J439 Emphysema, unspecified: Secondary | ICD-10-CM | POA: Insufficient documentation

## 2015-10-03 ENCOUNTER — Ambulatory Visit: Payer: Self-pay | Admitting: Radiation Oncology

## 2015-10-07 ENCOUNTER — Ambulatory Visit: Payer: PPO | Admitting: Radiation Oncology

## 2015-10-16 ENCOUNTER — Ambulatory Visit
Admission: RE | Admit: 2015-10-16 | Discharge: 2015-10-16 | Disposition: A | Discharge status: Home / Self Care | Payer: PPO | Source: Ambulatory Visit | Attending: Radiation Oncology | Admitting: Radiation Oncology

## 2015-10-16 ENCOUNTER — Encounter: Payer: Self-pay | Admitting: Radiation Oncology

## 2015-10-16 DIAGNOSIS — R634 Abnormal weight loss: Secondary | ICD-10-CM | POA: Diagnosis not present

## 2015-10-16 DIAGNOSIS — Z08 Encounter for follow-up examination after completed treatment for malignant neoplasm: Secondary | ICD-10-CM | POA: Diagnosis not present

## 2015-10-16 DIAGNOSIS — C3431 Malignant neoplasm of lower lobe, right bronchus or lung: Secondary | ICD-10-CM | POA: Insufficient documentation

## 2015-10-16 NOTE — Progress Notes (Addendum)
Radiation Oncology         [phone] ________________________________  Name: Tonya Valdez MRN: [id number]  Date: 10/16/2015  DOB: 17-Dec-1940  Follow-Up Visit Note  Outpatient  CC: Cathlean Cower, MD  Biagio Borg, MD  Diagnosis and Prior Radiotherapy:    ICD-9-CM ICD-10-CM   1. Primary malignant neoplasm of right lower lobe of lung (HCC) 162.5 C34.31    Stage IA NSCLC right lower lung  06/19/2014, 06/21/2014, 06/26/2014, 06/28/2014, 07/03/2014: Right lung/ 50 Gy in 5 fractions at 10 Gy per fraction  Narrative:  The patient returns today for routine follow-up. She denies pain or a cough. She reports some shortness of breath with activity. She is eating well per her report and has small frequent meals throughout the day. The patient has lost ~5lbs in the past 5 months. However her weight has ranged from 95-100 lb for years. The patient had a stable CT of the chest performed on 10/02/15. 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  Wt Readings from Last 3 Encounters:  10/16/15 95 lb 6.4 oz (43.3 kg)  06/20/15 99 lb (44.9 kg)  05/09/15 100 lb 4.8 oz (45.5 kg)    ALLERGIES:  has No Known Allergies.  Meds: Current Outpatient Prescriptions  Medication Sig Dispense Refill  . aspirin EC 325 MG EC tablet Take 1 tablet (325 mg total) by mouth daily. 30 tablet 0  . Blood Glucose Monitoring Suppl (ONE TOUCH ULTRA 2) w/Device KIT Use to check blood sugars daily 1 each 0  . carvedilol (COREG) 12.5 MG tablet TAKE 1 AND 1/2 TABLETS BY MOUTH TWICE DAILY WITH A MEAL 270 tablet 2  . furosemide (LASIX) 20 MG tablet Take 1 tablet (20 mg total) by mouth daily. 90 tablet 2  . glucose blood (ONE TOUCH ULTRA TEST) test strip 1 each by Other route 4 (four) times daily. Use to check blood sugar four times a day Dx E11.9 400 each 3  . hydrALAZINE (APRESOLINE) 25 MG tablet Take 1 tablet (25 mg total) by mouth 3 (three) times daily. 270 tablet 2  . Hypromellose (ARTIFICIAL TEARS OP) Place 1 drop into  both eyes 4 (four) times daily as needed (for dry eyes).    . isosorbide mononitrate (IMDUR) 30 MG 24 hr tablet Take 1 tablet (30 mg total) by mouth daily. 90 tablet 2  . Lancets (ONETOUCH ULTRASOFT) lancets 1 each by Other route 4 (four) times daily. Use to help check blood sugars four times a day Dx E11.9 400 each 3  . metFORMIN (GLUCOPHAGE) 500 MG tablet TAKE 1 TABLET TWICE DAILY WITH A MEAL 180 tablet 2  . Multiple Vitamin (MULTIVITAMIN) tablet Take 1 tablet by mouth daily.    . Omega-3 Fatty Acids (FISH OIL) 1000 MG CAPS Take 1,000 mg by mouth daily.     . pravastatin (PRAVACHOL) 40 MG tablet Take 1 tablet (40 mg total) by mouth every evening. 90 tablet 2   No current facility-administered medications for this encounter.     Physical Findings: The patient is in no acute distress. Patient is alert and oriented.  height is _0  (1.575 m) and weight is 95 lb 6.4 oz (43.3 kg). Her temperature is 98.2 F (36.8 C). Her blood pressure is 157/45 (abnormal) and her pulse is 60. Her oxygen saturation is 100%. .    The neck demonstrates no cervical or supraclavicular adenopathy. Heart has regular rate and rhythm with no murmurs. Chest is clear to auscultation.  Abdomen is soft, non-tender, non-distended.  Lab Findings: Lab Results  Component Value Date   WBC 12.4 (H) 05/07/2015   HGB 11.5 (L) 05/07/2015   HCT 34.1 (L) 05/07/2015   MCV 93.0 05/07/2015   PLT 252.0 05/07/2015    Radiographic Findings: Ct Chest Wo Contrast  Result Date: 10/02/2015 CLINICAL DATA:  Restaging left lung cancer. EXAM: CT CHEST WITHOUT CONTRAST TECHNIQUE: Multidetector CT imaging of the chest was performed following the standard protocol without IV contrast. COMPARISON:  06/19/2015 FINDINGS: Cardiovascular: Normal heart size. No pericardial effusion. Previous median sternotomy and CABG procedure. Aortic atherosclerosis noted. Mediastinum/Nodes: Calcified mediastinal lymph node identified. No adenopathy. The trachea  appears patent and is midline. Normal appearance of the esophagus. Lungs/Pleura: No pleural effusion. Peripheral interstitial reticulation is identified. Mild changes of paraseptal emphysema. Small, chronic right pleural effusion is identified with overlying chronic pleuroparenchymal scarring. The index lesion within the left lung base is again identified. This measures 1.3 by 2.5 cm, image 122 of series 5. This is stable when compared with the previous exam. There is a calcified granuloma identified within the right upper lobe. The sub solid lesion within the right lower lobe referenced on previous exam is stable measuring 1.3 cm, image 89 of series 5. Also stable is the previously identified 6 mm solid nodule in the right lower lobe, image 105 of series 5. Within the right lower lobe there is a subpleural nodule measuring 3 mm which is stable from previous exam, image 122 of series 5. Right lower lobe nodule is unchanged measuring 7 mm, image 114 of series 5. Upper Abdomen: The adrenal glands are normal. No focal liver abnormality identified. The visualized portions of the spleen are negative. The adrenal glands are negative. Aortic atherosclerosis is again noted. Musculoskeletal: The bones appear diffusely osteopenic. No aggressive lytic or sclerotic bone lesions. IMPRESSION: 1. Stable CT of the chest. The index lesion within the right lung base is unchanged when compared with the previous exam. Additionally, the other small solid and sub solid nodules within the right lung are stable. 2. Prior granulomatous disease. 3. Aortic atherosclerosis.  Previous CABG procedure. 4. Emphysema. Electronically Signed   By: Kerby Moors M.D.   On: 10/02/2015 16:38    Impression/Plan: NED on chest CT. I advised the patient to have a routine colonoscopies for health maintenance. Additionally, I will refer the patient for a nutritional consult given the patient's weight loss. She will have a CT scan of the chest WO cont in 4  months and a follow up in December.  _____________________________________   Eppie Gibson, MD  This document serves as a record of services personally performed by Eppie Gibson, MD. It was created on her behalf by Darcus Austin, a trained medical scribe. The creation of this record is based on the scribe's personal observations and the provider's statements to them. This document has been checked and approved by the attending provider.

## 2015-10-16 NOTE — Progress Notes (Signed)
[name] presents for follow up of radiation completed 07/03/14 to her Right Lower Lobe. She denies pain. She denies a cough. She tells me she has some shortness of breath with activity. She is eating well per her report, small frequent meals throughout the day.   BP (!) 157/45   Pulse 60   Temp 98.2 F (36.8 C)   Ht '5\' 2"'$  (1.575 m)   Wt 95 lb 6.4 oz (43.3 kg)   SpO2 100% Comment: room air  BMI 17.45 kg/m   Wt Readings from Last 3 Encounters:  10/16/15 95 lb 6.4 oz (43.3 kg)  06/20/15 99 lb (44.9 kg)  05/09/15 100 lb 4.8 oz (45.5 kg)

## 2015-10-18 ENCOUNTER — Encounter: Payer: Self-pay | Admitting: Vascular Surgery

## 2015-10-21 ENCOUNTER — Ambulatory Visit: Payer: PPO | Admitting: Nutrition

## 2015-10-21 NOTE — Progress Notes (Signed)
75 year old female diagnosed with lung cancer.  She is a patient of Dr. Earlie Server and Dr. Isidore Moos.  Past medical history includes hyperlipidemia, diabetes, COPD, CHF, CAD, and bladder cancer.  Medications include Lasix, Glucophage, multivitamin, omega-3 fatty acids.  Labs were reviewed.  Height: 62 inches. Weight: 95.4 pounds. Usual body weight: 90-100 pounds per patient. BMI: 17.45.  Patient denies nutrition impact symptoms. States she has a good appetite and eats small frequent meals. She likes most foods and has no problems chewing or swallowing. Patient reports she has always been thin and denies recent weight changes.  No nutrition diagnosis.  Encouraged patient to continue strategies for adequate calories and protein, and to contact me for any nutrition impact symptoms related to cancer treatments. My contact information was provided.

## 2015-10-22 ENCOUNTER — Encounter: Payer: Self-pay | Admitting: Vascular Surgery

## 2015-10-22 ENCOUNTER — Ambulatory Visit (INDEPENDENT_AMBULATORY_CARE_PROVIDER_SITE_OTHER): Payer: PPO | Admitting: Vascular Surgery

## 2015-10-22 ENCOUNTER — Ambulatory Visit (HOSPITAL_COMMUNITY)
Admission: RE | Admit: 2015-10-22 | Discharge: 2015-10-22 | Disposition: A | Discharge status: Home / Self Care | Payer: PPO | Source: Ambulatory Visit | Attending: Vascular Surgery | Admitting: Vascular Surgery

## 2015-10-22 VITALS — BP 173/68 | HR 62 | Temp 97.4°F | Resp 18 | Ht 60.5 in | Wt 94.6 lb

## 2015-10-22 DIAGNOSIS — I6522 Occlusion and stenosis of left carotid artery: Secondary | ICD-10-CM

## 2015-10-22 DIAGNOSIS — I6523 Occlusion and stenosis of bilateral carotid arteries: Secondary | ICD-10-CM

## 2015-10-22 LAB — VAS US CAROTID
LCCADSYS: 129 cm/s
LCCAPSYS: 126 cm/s
LEFT VERTEBRAL DIAS: 24 cm/s
LICADSYS: -126 cm/s
Left CCA dist dias: 33 cm/s
Left CCA prox dias: 28 cm/s
Left ICA dist dias: -22 cm/s
RCCADSYS: -120 cm/s
RIGHT CCA MID DIAS: 22 cm/s
RIGHT ECA DIAS: -3 cm/s
RIGHT VERTEBRAL DIAS: -17 cm/s
Right CCA prox dias: 35 cm/s
Right CCA prox sys: 268 cm/s

## 2015-10-22 NOTE — Progress Notes (Signed)
Vitals:   10/22/15 1557 10/22/15 1608 10/22/15 1609  BP: 126/78 (!) 174/68 (!) 173/68  Pulse: 62    Resp: 18    Temp: 97.4 F (36.3 C)    TempSrc: Oral    SpO2: 99%    Weight: 94 lb 9.6 oz (42.9 kg)    Height: 5' 0.5" (1.537 m)

## 2015-10-22 NOTE — Progress Notes (Signed)
[facility]  Patient name: Tonya Valdez MRN: [id number] DOB: 1941-02-12 Sex: female  REASON FOR VISIT: Follow-up carotid disease  HPI: Tonya Valdez is a 75 y.o. female who presents for continued follow-up of her bilateral carotid artery disease. She previously had a combined right carotid endarterectomy and CABG in 2014. At her last office visit in February 2017, there was a 60-79% left internal carotid artery stenosis. She denies any amaurosis fugax, sudden onset weakness or numbness of her extremities, expressive or receptive aphasia.  She previously underwent radiation for right lower lobe lung cancer. She recently had a chest CT this month that showed no evidence of disease. Her primary cardiologist is Dr. Stanford Breed. She has an office visit with him coming up soon. She takes a daily aspirin. She's on a statin for hyperlipidemia.  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 She is able to complete her daily activities without difficulty.  Past Medical History:  Diagnosis Date  . Bilateral carotid artery disease ([place]) 09/26/2012   carotid US (8/14):  R 80-99; L 60-79 => s/p R CEA 10/2012 (Dr. Donnetta Hutching)  . Bladder cancer ([place]) 04/26/2013  . Bladder tumor   . CAD (coronary artery disease)    a. LHC (10/07/12):  Ostial LM 60%, distal LM 40-50%, oLAD 40-50%, mLAD 70-80%, pCFX subtotally occluded, mCFX 80-90%, oRCA occluded, ostial L subclavian 40%, EF 25-30%, abdominal aorta diffusely diseased with severe ulcerated plaquing (right iliac ostial 95%; left renal long 60-70%, right okay). CO 2.1, CI 1.5 => s/p CABG (L-LAD, S-OM, S-PDA) 10/2012 with Dr. Cyndia Bent   . Cancer ([place])    bladder  . Chronic systolic CHF (congestive heart failure) ([place]) 08/04/2012   Echo (08/17/12): EF 30%, diffuse HK, mild MR, mild BAE, PASP.;  Echo (01/2013): EF 40-45%, inferior posterior HK, Gr 2 DD, mild MR, mild LAE  . COPD (chronic obstructive pulmonary disease) ([place])   . Diabetes mellitus  ([place]) TYPE II--  PER PCP NOTE DR Cathlean Cower   PER PT DOES NOT TAKES METFORMIN PRESCRIBED, SHE WATCHES DIET AND EXERCISES  . H/O hiatal hernia    s/p  75 yrs old  . Hyperkalemia    ACEI d/c'd 11/2012  . Hyperlipidemia   . Ischemic cardiomyopathy   . Lung cancer ([place])   . Lung nodule    chest CT 09/2012 => needs repeat by 03/2013  . PAD (peripheral artery disease) ([place])   . Positive TB test   . Radiation 4/26,4/28,5/3,5/5,5/10   Left lung 5 fractions at 10 Gy    Family History  Problem Relation Age of Onset  . Cancer Other     colon cancer  . Hypertension Other   . Kidney disease Other   . Diabetes Other   . Heart disease Mother     MI at age 63  . Cervical cancer Mother   . Cancer Mother   . Diabetes Mother   . Hypertension Mother   . Lung cancer Father 36    primary site lung CA then colon and bone  . Colon cancer Father   . Cancer Father   . Hypertension Father     SOCIAL HISTORY: Social History  Substance Use Topics  . Smoking status: Former Smoker    Packs/day: 1.00    Years: 50.00    Types: Cigarettes    Quit date: 04/23/2013  . Smokeless tobacco: Never Used  . Alcohol use No     Comment: occasional wine  not often    No Known Allergies  Current Outpatient Prescriptions  Medication Sig Dispense Refill  . aspirin EC 325 MG EC tablet Take 1 tablet (325 mg total) by mouth daily. 30 tablet 0  . Blood Glucose Monitoring Suppl (ONE TOUCH ULTRA 2) w/Device KIT Use to check blood sugars daily 1 each 0  . carvedilol (COREG) 12.5 MG tablet TAKE 1 AND 1/2 TABLETS BY MOUTH TWICE DAILY WITH A MEAL 270 tablet 2  . furosemide (LASIX) 20 MG tablet Take 1 tablet (20 mg total) by mouth daily. 90 tablet 2  . glucose blood (ONE TOUCH ULTRA TEST) test strip 1 each by Other route 4 (four) times daily. Use to check blood sugar four times a day Dx E11.9 400 each 3  . hydrALAZINE (APRESOLINE) 25 MG tablet Take 1 tablet (25 mg total) by mouth 3 (three) times daily. 270 tablet 2  .  Hypromellose (ARTIFICIAL TEARS OP) Place 1 drop into both eyes 4 (four) times daily as needed (for dry eyes).    . isosorbide mononitrate (IMDUR) 30 MG 24 hr tablet Take 1 tablet (30 mg total) by mouth daily. 90 tablet 2  . Lancets (ONETOUCH ULTRASOFT) lancets 1 each by Other route 4 (four) times daily. Use to help check blood sugars four times a day Dx E11.9 400 each 3  . metFORMIN (GLUCOPHAGE) 500 MG tablet TAKE 1 TABLET TWICE DAILY WITH A MEAL 180 tablet 2  . Multiple Vitamin (MULTIVITAMIN) tablet Take 1 tablet by mouth daily.    . Omega-3 Fatty Acids (FISH OIL) 1000 MG CAPS Take 1,000 mg by mouth daily.     . pravastatin (PRAVACHOL) 40 MG tablet Take 1 tablet (40 mg total) by mouth every evening. 90 tablet 2   No current facility-administered medications for this visit.     REVIEW OF SYSTEMS:  _0  denotes positive finding, _1  denotes negative finding Cardiac  Comments:  Chest pain or chest pressure:    Shortness of breath upon exertion:    Short of breath when lying flat:    Irregular heart rhythm:        Vascular    Pain in calf, thigh, or hip brought on by ambulation:    Pain in feet at night that wakes you up from your sleep:     Blood clot in your veins:    Leg swelling:         Pulmonary    Oxygen at home:    Productive cough:     Wheezing:         Neurologic    Sudden weakness in arms or legs:     Sudden numbness in arms or legs:     Sudden onset of difficulty speaking or slurred speech:    Temporary loss of vision in one eye:     Problems with dizziness:         Gastrointestinal    Blood in stool:     Vomited blood:         Genitourinary    Burning when urinating:     Blood in urine:        Psychiatric    Major depression:         Hematologic    Bleeding problems:    Problems with blood clotting too easily:        Skin    Rashes or ulcers:        Constitutional    Fever or chills:  PHYSICAL EXAM: Vitals:   10/22/15 1557 10/22/15 1608  10/22/15 1609  BP: 126/78 (!) 174/68 (!) 173/68  Pulse: 62    Resp: 18    Temp: 97.4 F (36.3 C)    TempSrc: Oral    SpO2: 99%    Weight: 94 lb 9.6 oz (42.9 kg)    Height: 5' 0.5" (1.537 m)      GENERAL: The patient is a well-nourished female, in no acute distress. The vital signs are documented above. CARDIAC: There is a regular rate and rhythm. Harsh bruits bilaterally. VASCULAR: 2+ radial pulses symmetric PULMONARY: There is good air exchange bilaterally without wheezing or rales. MUSCULOSKELETAL: There are no major deformities or cyanosis. NEUROLOGIC: No focal weakness or paresthesias are detected. SKIN: There are no ulcers or rashes noted. PSYCHIATRIC: The patient has a normal affect.  DATA:  Carotid duplex 10/22/2015  Left proximal ICA peak systolic velocity of 390 cm/s with end-diastolic velocity of 300 cm/s suggestive of 80-99% stenosis Right carotid endarterectomy site is patent without evidence of restenosis. Vertebral arteries are patent and with antegrade flow bilaterally. There is a high velocity in the left vertebral artery.  MEDICAL ISSUES: Asymptomatic high grade left internal carotid artery stenosis  The patient has had continued progression of her left internal carotid artery disease. She has reached threshold for surgery. Recommend left carotid endarterectomy for stroke prevention. Her right carotid endarterectomy site is patent. She will need to see Dr. Stanford Breed for preoperative cardiac clearance. Her surgery will then be scheduled at her convenience.   Virgina Jock, PA-C [facility]     I have examined the patient, reviewed and agree with above. Patient remains asymptomatic from the standpoint of her carotid stenosis. Had moderate to severe stenosis in her left carotid. She denies any amaurosis fugax, transient ischemic attack or stroke. Today's duplex shows progression with high-grade stenosis greater than 80%. Ended  endarterectomy for reduction of stroke risk. She understands procedure and the 1-2% risk of surgery with stroke and also liver slight risk of cranial nerve injury. We'll proceed after her upcoming visit with Dr. Wynn Maudlin, MD 10/22/2015 4:57 PM

## 2015-10-23 ENCOUNTER — Telehealth: Payer: Self-pay | Admitting: Vascular Surgery

## 2015-10-23 ENCOUNTER — Other Ambulatory Visit: Payer: Self-pay | Admitting: *Deleted

## 2015-10-23 NOTE — Telephone Encounter (Signed)
I spoke w/ patient and gave her the information regarding her appt w/ Dr.Crenshaw's office. She is scheduled to see Tonya Memos, PA on 11/01/15 at 8am. She is aware of the appt for her upcoming surgery w/ TFE as well. AWT

## 2015-10-25 ENCOUNTER — Encounter: Payer: Self-pay | Admitting: Cardiology

## 2015-11-01 ENCOUNTER — Ambulatory Visit (INDEPENDENT_AMBULATORY_CARE_PROVIDER_SITE_OTHER): Payer: PPO | Admitting: Physician Assistant

## 2015-11-01 ENCOUNTER — Encounter: Payer: Self-pay | Admitting: Physician Assistant

## 2015-11-01 VITALS — BP 157/54 | HR 52 | Ht 62.0 in | Wt 97.1 lb

## 2015-11-01 DIAGNOSIS — I10 Essential (primary) hypertension: Secondary | ICD-10-CM

## 2015-11-01 DIAGNOSIS — Z01818 Encounter for other preprocedural examination: Secondary | ICD-10-CM | POA: Diagnosis not present

## 2015-11-01 DIAGNOSIS — I25810 Atherosclerosis of coronary artery bypass graft(s) without angina pectoris: Secondary | ICD-10-CM

## 2015-11-01 DIAGNOSIS — R001 Bradycardia, unspecified: Secondary | ICD-10-CM | POA: Diagnosis not present

## 2015-11-01 NOTE — Patient Instructions (Signed)
Your physician recommends that you schedule a follow-up appointment in: 3 months with Dr. Stanford Breed.  If you need a refill on your cardiac medications before your next appointment, please call your pharmacy.

## 2015-11-01 NOTE — Progress Notes (Signed)
Cardiology Office Note   Date:  11/01/2015   ID:  ANNALISSA Valdez, DOB [birth year], MRN [id number]  PCP:  Cathlean Cower, MD  Cardiologist:  Dr Alcide Evener, PA-C   Chief Complaint  Patient presents with  . Follow-up    medical clearance, anuual visit, no chest pain    History of Present Illness: Tonya Valdez is a 75 y.o. female with a history of ICM w/ EF 40% 2014>>55-60% w/ grade 2 dd, Ao sclerosis by echo 2016, CABG 2014 (LIMA-LAD, SVG-OM, SVG-PDA) w/ R-CEA, MV 2016 w/ EF 47% & inf apical ischemia and inf basal scar, PVCs, bladder CA, lung CA s/p XRT, HTN, HLD, abd Ao diffuse dz w/ severe ulcerated plaquing (right iliac ostial 95%; left renal long 60-70%, right okay).  Pt seen recently by Dr Donnetta Hutching, L-ICA 80-99%>>L-CEA recommended, cardiology eval requested.  Tonya Valdez presents for cardiology evaluation.  [name] remains very active at baseline. She walks 20 minutes after every meal. She recently helped take care of her niece who had surgery and since the niece lives in a two-story home, she was up and down stairs all the time without any difficulty. She has noted dyspnea on exertion. She has no lower extremity edema, orthopnea or PND. She never gets chest pain or palpitations.  Of note, the only symptoms she had prior to her bypass surgery were lower extremity edema and shortness of breath. She has not had any of these symptoms.  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 A weight of about 95 pounds is normal for her, but she does wish she could get up to about 100 pounds   Past Medical History:  Diagnosis Date  . Bilateral carotid artery disease ([place]) 09/26/2012   carotid US (8/14):  R 80-99; L 60-79 => s/p R CEA 10/2012 (Dr. Donnetta Hutching)  . Bladder cancer ([place]) 04/26/2013  . Bladder tumor   . CAD (coronary artery disease)    a. LHC (10/07/12):  Ostial LM 60%, distal LM 40-50%, oLAD 40-50%, mLAD 70-80%, pCFX subtotally  occluded, mCFX 80-90%, oRCA occluded, ostial L subclavian 40%, EF 25-30%, abdominal aorta diffusely diseased with severe ulcerated plaquing (right iliac ostial 95%; left renal long 60-70%, right okay). CO 2.1, CI 1.5 => s/p CABG (L-LAD, S-OM, S-PDA) 10/2012 with Dr. Cyndia Bent   . Cancer (Tonya Valdez)    bladder  . Chronic systolic CHF (congestive heart failure) ([place]) 08/04/2012   Echo (08/17/12): EF 30%, diffuse HK, mild MR, mild BAE, PASP.;  Echo (01/2013): EF 40-45%, inferior posterior HK, Gr 2 DD, mild MR, mild LAE  . COPD (chronic obstructive pulmonary disease) ([place])   . Diabetes mellitus ([place]) PCP DR Cathlean Cower   Type II, PER PT DOES NOT TAKES METFORMIN PRESCRIBED, SHE WATCHES DIET AND EXERCISES  . H/O hiatal hernia    s/p  75 yrs old  . Hyperkalemia    ACEI d/c'd 11/2012  . Hyperlipidemia   . Ischemic cardiomyopathy   . Lung cancer ([place])   . Lung nodule    chest CT 09/2012 => needs repeat by 03/2013  . PAD (peripheral artery disease) ([place])   . Positive TB test   . Radiation 4/26,4/28,5/3,5/5,5/10   Left lung 5 fractions at 10 Gy    Past Surgical History:  Procedure Laterality Date  . ABDOMINAL HYSTERECTOMY  1981 (APPROX)   BILATERAL SALPINGO-OOPHORECTOMY WITH EXCEPTION A SMALL PIECE OF OVARY REMAINS  . APPENDECTOMY    .  CARDIAC CATHETERIZATION    . CHOLECYSTECTOMY    . CORONARY ARTERY BYPASS GRAFT N/A 11/14/2012   Procedure: CORONARY ARTERY BYPASS GRAFTING (CABG) times three using left internal mammary artery and left saphenous vein;  Surgeon: Gaye Pollack, MD; L-LAD, S-OM, S-PDA   . ENDARTERECTOMY Right 11/14/2012   Procedure: RIGHT ENDARTERECTOMY CAROTID with patch angioplasty.;  Surgeon: Rosetta Posner, MD;  Location: [facility] [place];  Service: Vascular;  Laterality: Right;  . HAMMER TOE SURGERY Left   . HIATAL HERNIA REPAIR  1964  (AGE 3)   CHOLECYSTECTOMY AND APPENDECTOMY  . INTRAOPERATIVE TRANSESOPHAGEAL ECHOCARDIOGRAM N/A 11/14/2012   Procedure: INTRAOPERATIVE TRANSESOPHAGEAL ECHOCARDIOGRAM;   Surgeon: Gaye Pollack, MD;  Location: [facility];  Service: Open Heart Surgery;  Laterality: N/A;  . LEFT ELBOW SURGERY    . [place] SURGERY  1993  . TONSILLECTOMY  1959  . TONSILLECTOMY    . TRANSURETHRAL RESECTION OF BLADDER TUMOR N/A 06/24/2012   Procedure: TRANSURETHRAL RESECTION OF BLADDER TUMOR  WITH GYRUS AND INSTILLATION OF MITOMYCIN C  (TURBT);  Surgeon: Claybon Jabs, MD;  Location: [facility];  Service: Urology;  Laterality: N/A;  . WRIST SURGERY Left     Current Outpatient Prescriptions  Medication Sig Dispense Refill  . aspirin EC 325 MG EC tablet Take 1 tablet (325 mg total) by mouth daily. 30 tablet 0  . Blood Glucose Monitoring Suppl (ONE TOUCH ULTRA 2) w/Device KIT Use to check blood sugars daily 1 each 0  . carvedilol (COREG) 12.5 MG tablet TAKE 1 AND 1/2 TABLETS BY MOUTH TWICE DAILY WITH A MEAL 270 tablet 2  . furosemide (LASIX) 20 MG tablet Take 1 tablet (20 mg total) by mouth daily. 90 tablet 2  . glucose blood (ONE TOUCH ULTRA TEST) test strip 1 each by Other route 4 (four) times daily. Use to check blood sugar four times a day Dx E11.9 400 each 3  . hydrALAZINE (APRESOLINE) 25 MG tablet Take 1 tablet (25 mg total) by mouth 3 (three) times daily. 270 tablet 2  . Hypromellose (ARTIFICIAL TEARS OP) Place 1 drop into both eyes 4 (four) times daily as needed (for dry eyes).    . isosorbide mononitrate (IMDUR) 30 MG 24 hr tablet Take 1 tablet (30 mg total) by mouth daily. 90 tablet 2  . Lancets (ONETOUCH ULTRASOFT) lancets 1 each by Other route 4 (four) times daily. Use to help check blood sugars four times a day Dx E11.9 400 each 3  . metFORMIN (GLUCOPHAGE) 500 MG tablet TAKE 1 TABLET TWICE DAILY WITH A MEAL 180 tablet 2  . Multiple Vitamin (MULTIVITAMIN) tablet Take 1 tablet by mouth daily.    . Omega-3 Fatty Acids (FISH OIL) 1000 MG CAPS Take 1,000 mg by mouth daily.     . pravastatin (PRAVACHOL) 40 MG tablet Take 1 tablet (40 mg total) by mouth every  evening. 90 tablet 2   No current facility-administered medications for this visit.     Allergies:   Ace inhibitors    Social History:  The patient  reports that she quit smoking about 2 years ago. Her smoking use included Cigarettes. She has a 50.00 pack-year smoking history. She has never used smokeless tobacco. She reports that she does not drink alcohol or use drugs.   Family History:  The patient's family history includes Cancer in her father, mother, and other; Cervical cancer in her mother; Colon cancer in her father; Diabetes in her mother and other; Heart disease  in her mother; Hypertension in her father, mother, and other; Kidney disease in her other; Lung cancer (age of onset: 55) in her father.    ROS:  Please see the history of present illness. All other systems are reviewed and negative.    PHYSICAL EXAM: VS:  BP (!) 157/54 (BP Location: Right Arm, Patient Position: Sitting, Cuff Size: Small)   Pulse (!) 52   Ht 5' 2"  (1.575 m)   Wt 97 lb 2 oz (44.1 kg)   BMI 17.76 kg/m  , BMI Body mass index is 17.76 kg/m. GEN: Well nourished, well developed, female in no acute distress  HEENT: normal for age  Neck: no JVD, bilateral carotid bruits, no masses Cardiac: RRR; 2/6 murmur, no rubs, or gallops Respiratory:  Scattered rales bilaterally, normal work of breathing, good air exchange GI: soft, nontender, nondistended, + BS MS: no deformity or atrophy; no edema; distal pulses are 2+ in all 4 extremities   Skin: warm and dry, no rash Neuro:  Strength and sensation are intact Psych: euthymic mood, full affect   EKG:  EKG is ordered today. The ekg ordered today demonstrates sinus bradycardia, rate 53, PAC seen   Recent Labs: 05/07/2015: ALT 15; BUN 17; Creatinine, Ser 0.75; Hemoglobin 11.5; Platelets 252.0; Potassium 4.1; Sodium 139; TSH 3.49    Lipid Panel    Component Value Date/Time   CHOL 133 05/07/2015 1509   TRIG 94.0 05/07/2015 1509   HDL 61.10 05/07/2015 1509     CHOLHDL 2 05/07/2015 1509   VLDL 18.8 05/07/2015 1509   LDLCALC 54 05/07/2015 1509   LDLDIRECT 127.6 10/07/2011 1037     Wt Readings from Last 3 Encounters:  11/01/15 97 lb 2 oz (44.1 kg)  10/22/15 94 lb 9.6 oz (42.9 kg)  10/16/15 95 lb 6.4 oz (43.3 kg)     Other studies Reviewed: Additional studies/ records that were reviewed today include: Office notes, hospital records and testing.  ASSESSMENT AND PLAN:  1.  Preoperative evaluation: Ms Nabi is having no ongoing ischemic symptoms. She has recently been climbing more stairs than usual, and tolerated this well. Therefore she can do 4 Mets. She had ischemia on a Myoview from March 2016, but has a good activity level.  I do not believe repeat stress testing or cardiac catheterization is indicated at this time. She is aware that she is at increased but acceptable risk for the planned procedure.  2. Sinus bradycardia: Her heart rate is in the low 50s at rest, but she is asymptomatic with this. She has no history of presyncope or syncope. Her carvedilol dose has not changed recently. Continue current dose for now, and followed for symptoms.  3. Hypertension: She took her medicines shortly before leaving the house for this appointment. They may not have had full affect yet. Because of her carotid stenosis, we'll not pursue aggressive blood pressure control. Reassess after the surgery.   Current medicines are reviewed at length with the patient today.  The patient does not have concerns regarding medicines.  The following changes have been made:  no change  Labs/ tests ordered today include:   Orders Placed This Encounter  Procedures  . EKG 12-Lead     Disposition:   FU with Dr. Stanford Breed  Signed, Rosaria Ferries, PA-C  11/01/2015 8:51 AM    [place] Phone: [phone]; Fax: [phone]  This note was written with the assistance of speech recognition software. Please excuse any transcriptional  errors.

## 2015-11-05 NOTE — Progress Notes (Signed)
Per Leota Jacobsen at Dr. Luther Parody office, patient is to continue taking Aspirin 325 but not to take the day of surgery.

## 2015-11-05 NOTE — Pre-Procedure Instructions (Signed)
Tonya Valdez [facility]  11/05/2015      [facility] - [place], [place] [address] Phone: [phone] Fax: [place]), [place] - [address] [place]) [place] [address] Phone: [phone] Fax: [phone]    Your procedure is scheduled on Friday, September 15th, 2017.  Report to [facility] Admitting at 5:30 A.M.   Call this number if you have problems the morning of surgery:  [phone]   Remember:  Do not eat food or drink liquids after midnight.   Take these medicines the morning of surgery with A SIP OF WATER: Carvedilol (Coreg), Hydralazine (Apresoline), Isosorbide Mononitrate (Imdur).  Stop taking: Naproxen, Aleve, Ibuprofen, Advil, Motrin, BC's, Goody's, Fish oil, all herbal medications, and all vitamins.    WHAT DO I DO ABOUT MY DIABETES MEDICATION?  [name] Do not take oral diabetes medicines (pills) the morning of surgery.  Do NOT take Metformin the morning of surgery.    How to Manage Your Diabetes Before and After Surgery  Why is it important to control my blood sugar before and after surgery? . Improving blood sugar levels before and after surgery helps healing and can limit problems. . A way of improving blood sugar control is eating a healthy diet by: o  Eating less sugar and carbohydrates o  Increasing activity/exercise o  Talking with your doctor about reaching your blood sugar goals . High blood sugars (greater than 180 mg/dL) can raise your risk of infections and slow your recovery, so you will need to focus on controlling your diabetes during the weeks before surgery. . Make sure that the doctor who takes care of your diabetes knows about your planned surgery including the date and location.  How do I manage my blood sugar before surgery? . Check your blood sugar at least 4 times a day, starting 2 days  before surgery, to make sure that the level is not too high or low. o Check your blood sugar the morning of your surgery when you wake up and every 2 hours until you get to the Short Stay unit. . If your blood sugar is less than 70 mg/dL, you will need to treat for low blood sugar: o Do not take insulin. o Treat a low blood sugar (less than 70 mg/dL) with  cup of clear juice (cranberry or apple), 4 glucose tablets, OR glucose gel. o Recheck blood sugar in 15 minutes after treatment (to make sure it is greater than 70 mg/dL). If your blood sugar is not greater than 70 mg/dL on recheck, call [phone] for further instructions. . Report your blood sugar to the short stay nurse when you get to Short Stay.  . If you are admitted to the hospital after surgery: o Your blood sugar will be checked by the staff and you will probably be given insulin after surgery (instead of oral diabetes medicines) to make sure you have good blood sugar levels. o The goal for blood sugar control after surgery is 80-180 mg/dL.    Do not wear jewelry, make-up or nail polish.  Do not wear lotions, powders, or perfumes, or deoderant.  Do not shave 48 hours prior to surgery.    Do not bring valuables to the hospital.  [facility] is not responsible for any belongings or valuables.  Contacts, dentures or bridgework may not be worn into surgery.  Leave your suitcase in the car.  After surgery it may be brought to your room.  For patients admitted to the hospital, discharge time will be determined by your treatment team.  Patients discharged the day of surgery will not be allowed to drive home.   Special instructions:  Preparing for Surgery.   Please read over the following fact sheets that you were given. MRSA Information    [facility]- Preparing For Surgery  Before surgery, you can play an important role. Because skin is not sterile, your skin needs to be as free of germs as possible. You can reduce the number  of germs on your skin by washing with CHG (chlorahexidine gluconate) Soap before surgery.  CHG is an antiseptic cleaner which kills germs and bonds with the skin to continue killing germs even after washing.  Please do not use if you have an allergy to CHG or antibacterial soaps. If your skin becomes reddened/irritated stop using the CHG.  Do not shave (including legs and underarms) for at least 48 hours prior to first CHG shower. It is OK to shave your face.  Please follow these instructions carefully.   1. Shower the NIGHT BEFORE SURGERY and the MORNING OF SURGERY with CHG.   2. If you chose to wash your hair, wash your hair first as usual with your normal shampoo.  3. After you shampoo, rinse your hair and body thoroughly to remove the shampoo.  4. Use CHG as you would any other liquid soap. You can apply CHG directly to the skin and wash gently with a scrungie or a clean washcloth.   5. Apply the CHG Soap to your body ONLY FROM THE NECK DOWN.  Do not use on open wounds or open sores. Avoid contact with your eyes, ears, mouth and genitals (private parts). Wash genitals (private parts) with your normal soap.  6. Wash thoroughly, paying special attention to the area where your surgery will be performed.  7. Thoroughly rinse your body with warm water from the neck down.  8. DO NOT shower/wash with your normal soap after using and rinsing off the CHG Soap.  9. Pat yourself dry with a CLEAN TOWEL.   10. Wear CLEAN PAJAMAS   11. Place CLEAN SHEETS on your bed the night of your first shower and DO NOT SLEEP WITH PETS.  Day of Surgery: Do not apply any deodorants/lotions. Please wear clean clothes to the hospital/surgery center.

## 2015-11-06 ENCOUNTER — Encounter (HOSPITAL_COMMUNITY)
Admission: RE | Admit: 2015-11-06 | Discharge: 2015-11-06 | Disposition: A | Discharge status: Home / Self Care | Payer: PPO | Source: Ambulatory Visit | Attending: Vascular Surgery | Admitting: Vascular Surgery

## 2015-11-06 ENCOUNTER — Encounter (HOSPITAL_COMMUNITY): Payer: Self-pay

## 2015-11-06 HISTORY — DX: Essential (primary) hypertension: I10

## 2015-11-06 HISTORY — DX: Presence of spectacles and contact lenses: Z97.3

## 2015-11-06 HISTORY — DX: Unspecified cataract: H26.9

## 2015-11-06 LAB — PROTIME-INR
INR: 1.05
Prothrombin Time: 13.7 seconds (ref 11.4–15.2)

## 2015-11-06 LAB — TYPE AND SCREEN
ABO/RH(D): O NEG
ANTIBODY SCREEN: NEGATIVE

## 2015-11-06 LAB — CBC
HEMATOCRIT: 34.8 % — AB (ref 36.0–46.0)
HEMOGLOBIN: 11.5 g/dL — AB (ref 12.0–15.0)
MCH: 30.8 pg (ref 26.0–34.0)
MCHC: 33 g/dL (ref 30.0–36.0)
MCV: 93.3 fL (ref 78.0–100.0)
Platelets: 224 10*3/uL (ref 150–400)
RBC: 3.73 MIL/uL — AB (ref 3.87–5.11)
RDW: 14.4 % (ref 11.5–15.5)
WBC: 12 10*3/uL — AB (ref 4.0–10.5)

## 2015-11-06 LAB — URINALYSIS, ROUTINE W REFLEX MICROSCOPIC
BILIRUBIN URINE: NEGATIVE
Glucose, UA: NEGATIVE mg/dL
Hgb urine dipstick: NEGATIVE
KETONES UR: NEGATIVE mg/dL
LEUKOCYTES UA: NEGATIVE
NITRITE: NEGATIVE
Protein, ur: 30 mg/dL — AB
SPECIFIC GRAVITY, URINE: 1.007 (ref 1.005–1.030)
pH: 6 (ref 5.0–8.0)

## 2015-11-06 LAB — COMPREHENSIVE METABOLIC PANEL
ALBUMIN: 3.3 g/dL — AB (ref 3.5–5.0)
ALK PHOS: 76 U/L (ref 38–126)
ALT: 16 U/L (ref 14–54)
AST: 30 U/L (ref 15–41)
Anion gap: 9 (ref 5–15)
BILIRUBIN TOTAL: 0.7 mg/dL (ref 0.3–1.2)
BUN: 14 mg/dL (ref 6–20)
CO2: 27 mmol/L (ref 22–32)
CREATININE: 0.83 mg/dL (ref 0.44–1.00)
Calcium: 9.3 mg/dL (ref 8.9–10.3)
Chloride: 103 mmol/L (ref 101–111)
GFR calc Af Amer: >60 mL/min (ref >60)
GLUCOSE: 153 mg/dL — AB (ref 65–99)
POTASSIUM: 3.5 mmol/L (ref 3.5–5.1)
Sodium: 139 mmol/L (ref 135–145)
TOTAL PROTEIN: 6.8 g/dL (ref 6.5–8.1)

## 2015-11-06 LAB — APTT: APTT: 30 s (ref 24–36)

## 2015-11-06 LAB — URINE MICROSCOPIC-ADD ON

## 2015-11-06 LAB — SURGICAL PCR SCREEN
MRSA, PCR: NEGATIVE
Staphylococcus aureus: NEGATIVE

## 2015-11-06 LAB — GLUCOSE, CAPILLARY: Glucose-Capillary: 150 mg/dL — ABNORMAL HIGH (ref 65–99)

## 2015-11-06 NOTE — Progress Notes (Signed)
PCP - Dr. Cathlean Cower Cardiologist - Dr. Stanford Breed  EKG - 11/01/15 CXR - denies  Echo - 04/2014 Stress test - 04/2014  Patient denies chest pain and shortness of breath at PAT appointment  Cardiac Clearance note from Dr. Jacalyn Lefevre office - 11/01/15  Per Leota Jacobsen at Dr. Luther Parody office, patient is to continue taking Aspirin but is not to take the day of surgery.  Patient made aware and verbalized understanding.     Patient informed nurse that she checks her blood sugar 4 times a day and that her fasting glucose is usually around 100.

## 2015-11-07 LAB — HEMOGLOBIN A1C
HEMOGLOBIN A1C: 6.2 % — AB (ref 4.8–5.6)
MEAN PLASMA GLUCOSE: 131 mg/dL

## 2015-11-07 MED ORDER — DEXTROSE 5 % IV SOLN
1.5000 g | INTRAVENOUS | Status: AC
  Administered 2015-11-08: 1.5 g via INTRAVENOUS
  Filled 2015-11-07: qty 1.5

## 2015-11-07 NOTE — Progress Notes (Signed)
Anesthesia chart review: Patient is a 75 year old female scheduled for left carotid endarterectomy on 11/08/2015 by Dr. Donnetta Hutching.  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    - PCP is Dr. Cathlean Cower.  - Cardiologist is Dr. Stanford Breed. Patient was seen by Rosaria Ferries, PA-C on 11/01/15 for follow-up and pre-operative evaluation. She wrote, "I do not believe repeat stress testing or cardiac catheterization is indicated at this time. She is aware that she is at increased but acceptable risk for the planned procedure." - MED-ONC is Dr. Julien Nordmann. RAD-ONC is Dr. Pablo Ledger.  Meds include aspirin 325 mg, Coreg, Lasix, hydralazine, Imdur, metformin, fish oil, pravastatin.  BP 125/68   Pulse 63   Temp 37.3 C   Resp 18   Ht 5' 1.5" (1.562 m)   Wt 99 lb 3.2 oz (45 kg)   SpO2 98%   BMI 18.44 kg/m   11/01/15 EKG ([facility]): Sinus bradycardia with PACs, ST and T-wave abnormality, consider inferior ischemia.  05/16/14 Nuclear stress test: Overall Impression:  Intermediate risk stress nuclear study Inferoapical ischemia, inferobasal scar. Follow up with Dr. Stanford Breed. LV Wall Motion:  Mild LV dysfunction with moderate inferior hypokinesia. LVEF 47%. (Patient seen for stress test follow-up on 05/29/14. Dr. Stanford Breed reviewed results. He wrote, "I do not consider it high risk. She is not having significant dyspnea or chest pain. Plan medical therapy for now."  05/08/14 Echo: Study Conclusions - Left ventricle: The cavity size was normal. There was mild concentric hypertrophy. Systolic function was normal. The estimated ejection fraction was in the range of 55% to 60%. Wall motion was  normal; there were no regional wall motion abnormalities. Doppler parameters are consistent with pseudonormal left ventricular relaxation (grade 2 diastolic dysfunction). The E/A ratio is >1.5. The E/e&' ratio is >15, suggesting elevated LV filling pressure. - Aortic valve: Trileaflet. Sclerosis without stenosis. There was no regurgitation. - Mitral valve: Calcified annulus. Mildly thickened leaflets . There was mild regurgitation. - Left atrium: Moderately dilated at 40 ml/m2. - Tricuspid valve: There was mild regurgitation. - Pulmonary arteries: PA peak pressure: 31 mm Hg (S). - Inferior vena cava: The vessel was normal in size. The respirophasic diameter changes were in the normal range (>= 50%), consistent with normal central venous pressure. Impressions: - Compared to the prior echo in 2014, the EF has improved, however there is Stage 2 diastolic dysfunction with elevated LV filling pressure.  10/22/15 Carotid U/S: Impression: 1. Patent right carotid endarterectomy site with no evidence of restenosis. 2. 80-99% left internal carotid artery stenosis. 3. Left external carotid artery stenosis. 4. Increased velocity in the bilateral subclavian arteries.  10/02/15 Chest CT: IMPRESSION: 1. Stable CT of the chest. The index lesion within the right lung base is unchanged when compared with the previous exam. Additionally, the other small solid and sub solid nodules within the right lung are stable. 2. Prior granulomatous disease. 3. Aortic atherosclerosis.  Previous CABG procedure. 4. Emphysema.  12/18/13 PFTs: FVC 2.27 (87%), FEV1 1.32 (67%), DLCOunc 6.97 (32%).  Preoperative labs noted. A1c 6.2.  If no acute changes then I anticipate that she can proceed as planned.  George Hugh [facility] Short Stay Center/Anesthesiology Phone [phone] 11/07/2015 11:21 AM

## 2015-11-07 NOTE — Anesthesia Preprocedure Evaluation (Addendum)
Anesthesia Evaluation  Patient identified by MRN, date of birth, ID band Patient awake    Reviewed: Allergy & Precautions, NPO status , Patient's Chart, lab work & pertinent test results  History of Anesthesia Complications Negative for: history of anesthetic complications  Airway Mallampati: I  TM Distance: >3 FB Neck ROM: Full    Dental  (+) Edentulous Upper, Edentulous Lower   Pulmonary COPD, former smoker,  Lung cancer: XRT   breath sounds clear to auscultation       Cardiovascular hypertension, Pt. on medications and Pt. on home beta blockers (-) angina+ CAD, + CABG and + Peripheral Vascular Disease  (-) Past MI  Rhythm:Regular Rate:Normal     Neuro/Psych negative neurological ROS     GI/Hepatic negative GI ROS, Neg liver ROS,   Endo/Other  diabetes (glu 116), Oral Hypoglycemic Agents  Renal/GU negative Renal ROS     Musculoskeletal   Abdominal   Peds  Hematology   Anesthesia Other Findings   Reproductive/Obstetrics                            Anesthesia Physical Anesthesia Plan  ASA: III  Anesthesia Plan: General   Post-op Pain Management:    Induction: Intravenous  Airway Management Planned: Oral ETT  Additional Equipment: Arterial line  Intra-op Plan:   Post-operative Plan: Extubation in OR  Informed Consent: I have reviewed the patients History and Physical, chart, labs and discussed the procedure including the risks, benefits and alternatives for the proposed anesthesia with the patient or authorized representative who has indicated his/her understanding and acceptance.     Plan Discussed with: CRNA and Surgeon  Anesthesia Plan Comments: (Plan routine monitors, A line, GETA)        Anesthesia Quick Evaluation

## 2015-11-08 ENCOUNTER — Inpatient Hospital Stay (HOSPITAL_COMMUNITY): Payer: PPO | Admitting: Vascular Surgery

## 2015-11-08 ENCOUNTER — Inpatient Hospital Stay (HOSPITAL_COMMUNITY)
Admission: RE | Admit: 2015-11-08 | Discharge: 2015-11-09 | DRG: 038 | Disposition: A | Discharge status: Home / Self Care | Payer: PPO | Source: Ambulatory Visit | Attending: Vascular Surgery | Admitting: Vascular Surgery

## 2015-11-08 ENCOUNTER — Inpatient Hospital Stay (HOSPITAL_COMMUNITY): Payer: PPO | Admitting: Anesthesiology

## 2015-11-08 ENCOUNTER — Encounter (HOSPITAL_COMMUNITY): Payer: Self-pay | Admitting: *Deleted

## 2015-11-08 ENCOUNTER — Encounter (HOSPITAL_COMMUNITY)
Admission: RE | Disposition: A | Discharge status: Home / Self Care | Payer: Self-pay | Source: Ambulatory Visit | Attending: Vascular Surgery

## 2015-11-08 DIAGNOSIS — E1151 Type 2 diabetes mellitus with diabetic peripheral angiopathy without gangrene: Secondary | ICD-10-CM | POA: Diagnosis present

## 2015-11-08 DIAGNOSIS — I6522 Occlusion and stenosis of left carotid artery: Secondary | ICD-10-CM

## 2015-11-08 DIAGNOSIS — Z87891 Personal history of nicotine dependence: Secondary | ICD-10-CM

## 2015-11-08 DIAGNOSIS — Z7984 Long term (current) use of oral hypoglycemic drugs: Secondary | ICD-10-CM

## 2015-11-08 DIAGNOSIS — J449 Chronic obstructive pulmonary disease, unspecified: Secondary | ICD-10-CM | POA: Diagnosis present

## 2015-11-08 DIAGNOSIS — Z7982 Long term (current) use of aspirin: Secondary | ICD-10-CM

## 2015-11-08 DIAGNOSIS — Z85118 Personal history of other malignant neoplasm of bronchus and lung: Secondary | ICD-10-CM | POA: Diagnosis not present

## 2015-11-08 DIAGNOSIS — I6502 Occlusion and stenosis of left vertebral artery: Secondary | ICD-10-CM | POA: Diagnosis not present

## 2015-11-08 DIAGNOSIS — E785 Hyperlipidemia, unspecified: Secondary | ICD-10-CM | POA: Diagnosis not present

## 2015-11-08 DIAGNOSIS — Z79899 Other long term (current) drug therapy: Secondary | ICD-10-CM | POA: Diagnosis not present

## 2015-11-08 DIAGNOSIS — Z8551 Personal history of malignant neoplasm of bladder: Secondary | ICD-10-CM

## 2015-11-08 DIAGNOSIS — Z951 Presence of aortocoronary bypass graft: Secondary | ICD-10-CM

## 2015-11-08 DIAGNOSIS — I110 Hypertensive heart disease with heart failure: Secondary | ICD-10-CM | POA: Diagnosis present

## 2015-11-08 DIAGNOSIS — I255 Ischemic cardiomyopathy: Secondary | ICD-10-CM | POA: Diagnosis present

## 2015-11-08 DIAGNOSIS — I5032 Chronic diastolic (congestive) heart failure: Secondary | ICD-10-CM | POA: Diagnosis not present

## 2015-11-08 DIAGNOSIS — I2510 Atherosclerotic heart disease of native coronary artery without angina pectoris: Secondary | ICD-10-CM | POA: Diagnosis present

## 2015-11-08 DIAGNOSIS — I5022 Chronic systolic (congestive) heart failure: Secondary | ICD-10-CM | POA: Diagnosis not present

## 2015-11-08 DIAGNOSIS — Z923 Personal history of irradiation: Secondary | ICD-10-CM | POA: Diagnosis not present

## 2015-11-08 HISTORY — PX: ENDARTERECTOMY: SHX5162

## 2015-11-08 LAB — GLUCOSE, CAPILLARY
GLUCOSE-CAPILLARY: 116 mg/dL — AB (ref 65–99)
GLUCOSE-CAPILLARY: 88 mg/dL (ref 65–99)
GLUCOSE-CAPILLARY: 118 mg/dL — AB (ref 65–99)
Glucose-Capillary: 130 mg/dL — ABNORMAL HIGH (ref 65–99)
Glucose-Capillary: 85 mg/dL (ref 65–99)

## 2015-11-08 SURGERY — ENDARTERECTOMY, CAROTID
Anesthesia: General | Site: Neck | Laterality: Left

## 2015-11-08 MED ORDER — SODIUM CHLORIDE 0.9 % IV SOLN: INTRAVENOUS | Status: DC

## 2015-11-08 MED ORDER — CHLORHEXIDINE GLUCONATE CLOTH 2 % EX PADS: 6.0000 | MEDICATED_PAD | Freq: Once | CUTANEOUS | Status: DC

## 2015-11-08 MED ORDER — OXYCODONE-ACETAMINOPHEN 5-325 MG PO TABS: 1.0000 | ORAL_TABLET | Freq: Four times a day (QID) | ORAL | 0 refills | Status: DC | PRN

## 2015-11-08 MED ORDER — BISACODYL 10 MG RE SUPP: 10.0000 mg | Freq: Every day | RECTAL | Status: DC | PRN

## 2015-11-08 MED ORDER — PROMETHAZINE HCL 25 MG/ML IJ SOLN: 6.2500 mg | INTRAMUSCULAR | Status: DC | PRN

## 2015-11-08 MED ORDER — FENTANYL CITRATE (PF) 100 MCG/2ML IJ SOLN
25.0000 ug | INTRAMUSCULAR | Status: DC | PRN
  Administered 2015-11-08: 25 ug via INTRAVENOUS

## 2015-11-08 MED ORDER — ONDANSETRON HCL 4 MG/2ML IJ SOLN: 4.0000 mg | Freq: Four times a day (QID) | INTRAMUSCULAR | Status: DC | PRN

## 2015-11-08 MED ORDER — PRAVASTATIN SODIUM 40 MG PO TABS
40.0000 mg | ORAL_TABLET | Freq: Every evening | ORAL | Status: DC
  Administered 2015-11-08: 40 mg via ORAL
  Filled 2015-11-08: qty 1

## 2015-11-08 MED ORDER — METOPROLOL TARTRATE 5 MG/5ML IV SOLN: 2.0000 mg | INTRAVENOUS | Status: DC | PRN

## 2015-11-08 MED ORDER — ALUM & MAG HYDROXIDE-SIMETH 200-200-20 MG/5ML PO SUSP: 15.0000 mL | ORAL | Status: DC | PRN

## 2015-11-08 MED ORDER — LABETALOL HCL 5 MG/ML IV SOLN: 10.0000 mg | INTRAVENOUS | Status: DC | PRN

## 2015-11-08 MED ORDER — LIDOCAINE HCL (CARDIAC) 20 MG/ML IV SOLN
INTRAVENOUS | Status: DC | PRN
Start: 2015-11-08 — End: 2015-11-08
  Administered 2015-11-08: 10 mg via INTRAVENOUS

## 2015-11-08 MED ORDER — GLYCOPYRROLATE 0.2 MG/ML IJ SOLN
INTRAMUSCULAR | Status: DC | PRN
  Administered 2015-11-08: 0.2 mg via INTRAVENOUS

## 2015-11-08 MED ORDER — ACETAMINOPHEN 325 MG RE SUPP: 325.0000 mg | RECTAL | Status: DC | PRN

## 2015-11-08 MED ORDER — FUROSEMIDE 20 MG PO TABS
20.0000 mg | ORAL_TABLET | Freq: Every day | ORAL | Status: DC
  Administered 2015-11-09: 20 mg via ORAL
  Filled 2015-11-08: qty 1

## 2015-11-08 MED ORDER — MAGNESIUM HYDROXIDE 400 MG/5ML PO SUSP: 30.0000 mL | Freq: Every day | ORAL | Status: DC | PRN

## 2015-11-08 MED ORDER — EPHEDRINE SULFATE 50 MG/ML IJ SOLN
INTRAMUSCULAR | Status: DC | PRN
  Administered 2015-11-08: 10 mg via INTRAVENOUS

## 2015-11-08 MED ORDER — PHENYLEPHRINE HCL 10 MG/ML IJ SOLN
INTRAVENOUS | Status: DC | PRN
  Administered 2015-11-08: 40 ug/min via INTRAVENOUS

## 2015-11-08 MED ORDER — FENTANYL CITRATE (PF) 100 MCG/2ML IJ SOLN
INTRAMUSCULAR | Status: AC
  Filled 2015-11-08: qty 2

## 2015-11-08 MED ORDER — SUGAMMADEX SODIUM 200 MG/2ML IV SOLN
INTRAVENOUS | Status: AC
  Filled 2015-11-08: qty 2

## 2015-11-08 MED ORDER — ACETAMINOPHEN 325 MG PO TABS: 325.0000 mg | ORAL_TABLET | ORAL | Status: DC | PRN

## 2015-11-08 MED ORDER — MIDAZOLAM HCL 2 MG/2ML IJ SOLN: 0.5000 mg | Freq: Once | INTRAMUSCULAR | Status: DC | PRN

## 2015-11-08 MED ORDER — PROPOFOL 10 MG/ML IV BOLUS
INTRAVENOUS | Status: AC
  Filled 2015-11-08: qty 20

## 2015-11-08 MED ORDER — SODIUM CHLORIDE 0.9 % IV SOLN
INTRAVENOUS | Status: DC
  Administered 2015-11-08: 16:00:00 via INTRAVENOUS

## 2015-11-08 MED ORDER — SODIUM CHLORIDE 0.9 % IV SOLN
INTRAVENOUS | Status: DC | PRN
  Administered 2015-11-08: 07:00:00

## 2015-11-08 MED ORDER — SODIUM CHLORIDE 0.9 % IV SOLN
0.0125 ug/kg/min | INTRAVENOUS | Status: DC
  Filled 2015-11-08: qty 2000

## 2015-11-08 MED ORDER — HEPARIN SODIUM (PORCINE) 1000 UNIT/ML IJ SOLN
INTRAMUSCULAR | Status: AC
  Filled 2015-11-08: qty 1

## 2015-11-08 MED ORDER — SUGAMMADEX SODIUM 200 MG/2ML IV SOLN
INTRAVENOUS | Status: DC | PRN
  Administered 2015-11-08: 100 mg via INTRAVENOUS

## 2015-11-08 MED ORDER — ASPIRIN EC 325 MG PO TBEC
325.0000 mg | DELAYED_RELEASE_TABLET | Freq: Every day | ORAL | Status: DC
  Administered 2015-11-09: 325 mg via ORAL
  Filled 2015-11-08: qty 1

## 2015-11-08 MED ORDER — SODIUM CHLORIDE 0.9 % IV SOLN
INTRAVENOUS | Status: DC | PRN
  Administered 2015-11-08: .1 ug/kg/min via INTRAVENOUS

## 2015-11-08 MED ORDER — PROTAMINE SULFATE 10 MG/ML IV SOLN
INTRAVENOUS | Status: AC
  Filled 2015-11-08: qty 5

## 2015-11-08 MED ORDER — LIDOCAINE HCL (PF) 1 % IJ SOLN
INTRAMUSCULAR | Status: AC
  Filled 2015-11-08: qty 30

## 2015-11-08 MED ORDER — LACTATED RINGERS IV SOLN
INTRAVENOUS | Status: DC | PRN
  Administered 2015-11-08: 07:00:00 via INTRAVENOUS

## 2015-11-08 MED ORDER — PROTAMINE SULFATE 10 MG/ML IV SOLN
INTRAVENOUS | Status: DC | PRN
  Administered 2015-11-08: 50 mg via INTRAVENOUS

## 2015-11-08 MED ORDER — ONDANSETRON HCL 4 MG/2ML IJ SOLN
INTRAMUSCULAR | Status: DC | PRN
  Administered 2015-11-08: 4 mg via INTRAVENOUS

## 2015-11-08 MED ORDER — LIDOCAINE 2% (20 MG/ML) 5 ML SYRINGE
INTRAMUSCULAR | Status: AC
  Filled 2015-11-08: qty 5

## 2015-11-08 MED ORDER — ISOSORBIDE MONONITRATE ER 30 MG PO TB24
30.0000 mg | ORAL_TABLET | Freq: Every day | ORAL | Status: DC
  Administered 2015-11-09: 30 mg via ORAL
  Filled 2015-11-08: qty 1

## 2015-11-08 MED ORDER — ADULT MULTIVITAMIN W/MINERALS CH
1.0000 | ORAL_TABLET | Freq: Every day | ORAL | Status: DC
  Administered 2015-11-09: 1 via ORAL
  Filled 2015-11-08: qty 1

## 2015-11-08 MED ORDER — INSULIN ASPART 100 UNIT/ML ~~LOC~~ SOLN
0.0000 [IU] | Freq: Three times a day (TID) | SUBCUTANEOUS | Status: DC
Start: 2015-11-08 — End: 2015-11-09

## 2015-11-08 MED ORDER — SODIUM CHLORIDE 0.9 % IV SOLN: 500.0000 mL | Freq: Once | INTRAVENOUS | Status: DC | PRN

## 2015-11-08 MED ORDER — DEXTROSE 5 % IV SOLN
1.5000 g | Freq: Two times a day (BID) | INTRAVENOUS | Status: AC
  Administered 2015-11-08 – 2015-11-09 (×2): 1.5 g via INTRAVENOUS
  Filled 2015-11-08 (×3): qty 1.5

## 2015-11-08 MED ORDER — FENTANYL CITRATE (PF) 100 MCG/2ML IJ SOLN
INTRAMUSCULAR | Status: DC | PRN
  Administered 2015-11-08: 75 ug via INTRAVENOUS
  Administered 2015-11-08: 25 ug via INTRAVENOUS

## 2015-11-08 MED ORDER — PANTOPRAZOLE SODIUM 40 MG PO TBEC
40.0000 mg | DELAYED_RELEASE_TABLET | Freq: Every day | ORAL | Status: DC
  Administered 2015-11-09: 40 mg via ORAL
  Filled 2015-11-08: qty 1

## 2015-11-08 MED ORDER — METFORMIN HCL 500 MG PO TABS
500.0000 mg | ORAL_TABLET | Freq: Two times a day (BID) | ORAL | Status: DC
  Administered 2015-11-09: 500 mg via ORAL
  Filled 2015-11-08: qty 1

## 2015-11-08 MED ORDER — ONDANSETRON HCL 4 MG/2ML IJ SOLN
INTRAMUSCULAR | Status: AC
  Filled 2015-11-08: qty 2

## 2015-11-08 MED ORDER — PHENOL 1.4 % MT LIQD
1.0000 | OROMUCOSAL | Status: DC | PRN
  Administered 2015-11-08: 1 via OROMUCOSAL
  Filled 2015-11-08: qty 177

## 2015-11-08 MED ORDER — GUAIFENESIN-DM 100-10 MG/5ML PO SYRP: 15.0000 mL | ORAL_SOLUTION | ORAL | Status: DC | PRN

## 2015-11-08 MED ORDER — POTASSIUM CHLORIDE CRYS ER 20 MEQ PO TBCR: 20.0000 meq | EXTENDED_RELEASE_TABLET | Freq: Every day | ORAL | Status: DC | PRN

## 2015-11-08 MED ORDER — MEPERIDINE HCL 25 MG/ML IJ SOLN: 6.2500 mg | INTRAMUSCULAR | Status: DC | PRN

## 2015-11-08 MED ORDER — MORPHINE SULFATE (PF) 2 MG/ML IV SOLN
1.0000 mg | INTRAVENOUS | Status: DC | PRN
  Administered 2015-11-08: 2 mg via INTRAVENOUS
  Filled 2015-11-08: qty 1

## 2015-11-08 MED ORDER — HYDRALAZINE HCL 25 MG PO TABS
25.0000 mg | ORAL_TABLET | Freq: Three times a day (TID) | ORAL | Status: DC
  Administered 2015-11-08 – 2015-11-09 (×2): 25 mg via ORAL
  Filled 2015-11-08 (×2): qty 1

## 2015-11-08 MED ORDER — HYDRALAZINE HCL 20 MG/ML IJ SOLN
5.0000 mg | INTRAMUSCULAR | Status: DC | PRN
Start: 2015-11-08 — End: 2015-11-09

## 2015-11-08 MED ORDER — HEPARIN SODIUM (PORCINE) 1000 UNIT/ML IJ SOLN
INTRAMUSCULAR | Status: DC | PRN
  Administered 2015-11-08: 5000 [IU] via INTRAVENOUS

## 2015-11-08 MED ORDER — 0.9 % SODIUM CHLORIDE (POUR BTL) OPTIME
TOPICAL | Status: DC | PRN
  Administered 2015-11-08: 2000 mL

## 2015-11-08 MED ORDER — ROCURONIUM BROMIDE 10 MG/ML (PF) SYRINGE
PREFILLED_SYRINGE | INTRAVENOUS | Status: AC
  Filled 2015-11-08: qty 10

## 2015-11-08 MED ORDER — CARVEDILOL 6.25 MG PO TABS
18.7500 mg | ORAL_TABLET | Freq: Two times a day (BID) | ORAL | Status: DC
  Administered 2015-11-09: 18.75 mg via ORAL
  Filled 2015-11-08: qty 1

## 2015-11-08 MED ORDER — DOCUSATE SODIUM 100 MG PO CAPS
100.0000 mg | ORAL_CAPSULE | Freq: Every day | ORAL | Status: DC
  Filled 2015-11-08: qty 1

## 2015-11-08 MED ORDER — OXYCODONE-ACETAMINOPHEN 5-325 MG PO TABS
1.0000 | ORAL_TABLET | ORAL | Status: DC | PRN
  Administered 2015-11-09: 1 via ORAL
  Filled 2015-11-08: qty 1

## 2015-11-08 SURGICAL SUPPLY — 44 items
APL SKNCLS STERI-STRIP NONHPOA (GAUZE/BANDAGES/DRESSINGS) ×1
BENZOIN TINCTURE PRP APPL 2/3 (GAUZE/BANDAGES/DRESSINGS) ×3 IMPLANT
CANISTER SUCTION 2500CC (MISCELLANEOUS) ×3 IMPLANT
CANNULA VESSEL 3MM 2 BLNT TIP (CANNULA) IMPLANT
CATH ROBINSON RED A/P 18FR (CATHETERS) ×3 IMPLANT
CLIP LIGATING EXTRA MED SLVR (CLIP) ×3 IMPLANT
CLIP LIGATING EXTRA SM BLUE (MISCELLANEOUS) ×3 IMPLANT
CLOSURE STERI-STRIP 1/2X4 (GAUZE/BANDAGES/DRESSINGS) ×1
CLOSURE WOUND 1/2 X4 (GAUZE/BANDAGES/DRESSINGS) ×1
CLSR STERI-STRIP ANTIMIC 1/2X4 (GAUZE/BANDAGES/DRESSINGS) ×1 IMPLANT
CRADLE DONUT ADULT HEAD (MISCELLANEOUS) ×3 IMPLANT
DECANTER SPIKE VIAL GLASS SM (MISCELLANEOUS) IMPLANT
DRAIN HEMOVAC 1/8 X 5 (WOUND CARE) IMPLANT
DRSG COVADERM 4X6 (GAUZE/BANDAGES/DRESSINGS) ×2 IMPLANT
DRSG COVADERM 4X8 (GAUZE/BANDAGES/DRESSINGS) ×2 IMPLANT
ELECT REM PT RETURN 9FT ADLT (ELECTROSURGICAL) ×3
ELECTRODE REM PT RTRN 9FT ADLT (ELECTROSURGICAL) ×1 IMPLANT
EVACUATOR SILICONE 100CC (DRAIN) IMPLANT
GAUZE SPONGE 4X4 12PLY STRL (GAUZE/BANDAGES/DRESSINGS) ×3 IMPLANT
GEL ULTRASOUND 20GR AQUASONIC (MISCELLANEOUS) IMPLANT
GLOVE SS BIOGEL STRL SZ 7.5 (GLOVE) ×1 IMPLANT
GLOVE SUPERSENSE BIOGEL SZ 7.5 (GLOVE) ×2
GOWN STRL REUS W/ TWL LRG LVL3 (GOWN DISPOSABLE) ×3 IMPLANT
GOWN STRL REUS W/TWL LRG LVL3 (GOWN DISPOSABLE) ×9
KIT BASIN OR (CUSTOM PROCEDURE TRAY) ×3 IMPLANT
KIT ROOM TURNOVER OR (KITS) ×3 IMPLANT
NEEDLE 22X1 1/2 (OR ONLY) (NEEDLE) IMPLANT
NS IRRIG 1000ML POUR BTL (IV SOLUTION) ×6 IMPLANT
PACK CAROTID (CUSTOM PROCEDURE TRAY) ×3 IMPLANT
PAD ARMBOARD 7.5X6 YLW CONV (MISCELLANEOUS) ×6 IMPLANT
PATCH HEMASHIELD 8X75 (Vascular Products) ×2 IMPLANT
SHUNT CAROTID BYPASS 10 (VASCULAR PRODUCTS) ×2 IMPLANT
SHUNT CAROTID BYPASS 12FRX15.5 (VASCULAR PRODUCTS) IMPLANT
SPONGE INTESTINAL PEANUT (DISPOSABLE) ×3 IMPLANT
STRIP CLOSURE SKIN 1/2X4 (GAUZE/BANDAGES/DRESSINGS) ×2 IMPLANT
SUT ETHILON 3 0 PS 1 (SUTURE) IMPLANT
SUT PROLENE 6 0 CC (SUTURE) ×3 IMPLANT
SUT SILK 3 0 (SUTURE)
SUT SILK 3-0 18XBRD TIE 12 (SUTURE) IMPLANT
SUT VIC AB 3-0 SH 27 (SUTURE) ×6
SUT VIC AB 3-0 SH 27X BRD (SUTURE) ×2 IMPLANT
SUT VICRYL 4-0 PS2 18IN ABS (SUTURE) ×3 IMPLANT
SYR CONTROL 10ML LL (SYRINGE) IMPLANT
WATER STERILE IRR 1000ML POUR (IV SOLUTION) ×3 IMPLANT

## 2015-11-08 NOTE — Progress Notes (Signed)
  Day of Surgery Note    Subjective:  Says she has just a little bit of pain, but not bad  Vitals:   11/08/15 0952 11/08/15 1007  BP: (!) 115/41 (!) 111/35  Pulse: 60 90  Resp: 19 (!) 21  Temp:      Incisions:   Covered-bandage is clean and dry Extremities:  Moving all extremities equally Cardiac:  regular Lungs:  Non labored Neuro:  In tact; tongue is midline   Assessment/Plan:  This is a 75 y.o. female who is s/p left carotid endarterectomy  -pt doing well in pacu and neurologically in tact. -to [place] when bed available -anticipate discharge in the am   Leontine Locket, PA-C 11/08/2015 10:23 AM [phone]

## 2015-11-08 NOTE — Interval H&P Note (Signed)
History and Physical Interval Note:  11/08/2015 6:50 AM  Tonya Valdez  has presented today for surgery, with the diagnosis of Left carotid stenosis   The various methods of treatment have been discussed with the patient and family. After consideration of risks, benefits and other options for treatment, the patient has consented to  Procedure(s): ENDARTERECTOMY CAROTID (Left) as a surgical intervention .  The patient's history has been reviewed, patient examined, no change in status, stable for surgery.  I have reviewed the patient's chart and labs.  Questions were answered to the patient's satisfaction.     Curt Jews

## 2015-11-08 NOTE — Anesthesia Postprocedure Evaluation (Signed)
Anesthesia Post Note  Patient: Tonya Valdez  Procedure(s) Performed: Procedure(s) (LRB): ENDARTERECTOMY CAROTID WITH PATCH ANGIOPLASTY (Left)  Patient location during evaluation: PACU Anesthesia Type: General Level of consciousness: awake and alert, oriented and patient cooperative Pain management: pain level controlled Vital Signs Assessment: post-procedure vital signs reviewed and stable Respiratory status: spontaneous breathing, nonlabored ventilation, respiratory function stable and patient connected to nasal cannula oxygen Cardiovascular status: blood pressure returned to baseline and stable Postop Assessment: no signs of nausea or vomiting Anesthetic complications: no    Last Vitals:  Vitals:   11/08/15 1322 11/08/15 1343  BP: (!) 121/40 (!) 113/47  Pulse: (!) 56 (!) 46  Resp: 19 14  Temp: 36.5 C 37 C    Last Pain:  Vitals:   11/08/15 1343  TempSrc: Oral  PainSc: 4                  Kody Vigil,E. Allix Blomquist

## 2015-11-08 NOTE — Anesthesia Procedure Notes (Signed)
Procedure Name: Intubation Date/Time: 11/08/2015 7:44 AM Performed by: Izora Gala Pre-anesthesia Checklist: Patient identified, Emergency Drugs available, Suction available and Patient being monitored Patient Re-evaluated:Patient Re-evaluated prior to inductionOxygen Delivery Method: Circle system utilized Preoxygenation: Pre-oxygenation with 100% oxygen Intubation Type: IV induction Ventilation: Mask ventilation without difficulty and Oral airway inserted - appropriate to patient size Laryngoscope Size: Miller and 3 Grade View: Grade I Tube type: Oral Tube size: 7.0 mm Number of attempts: 1 Airway Equipment and Method: Stylet and Oral airway Placement Confirmation: ETT inserted through vocal cords under direct vision,  positive ETCO2 and breath sounds checked- equal and bilateral Secured at: 21 cm Tube secured with: Tape

## 2015-11-08 NOTE — H&P (View-Only) (Signed)
[facility]  Patient name: Tonya Valdez MRN: [id number] DOB: 1941-02-12 Sex: female  REASON FOR VISIT: Follow-up carotid disease  HPI: Tonya Valdez is a 75 y.o. female who presents for continued follow-up of her bilateral carotid artery disease. She previously had a combined right carotid endarterectomy and CABG in 2014. At her last office visit in February 2017, there was a 60-79% left internal carotid artery stenosis. She denies any amaurosis fugax, sudden onset weakness or numbness of her extremities, expressive or receptive aphasia.  She previously underwent radiation for right lower lobe lung cancer. She recently had a chest CT this month that showed no evidence of disease. Her primary cardiologist is Dr. Stanford Breed. She has an office visit with him coming up soon. She takes a daily aspirin. She's on a statin for hyperlipidemia.  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 She is able to complete her daily activities without difficulty.  Past Medical History:  Diagnosis Date  . Bilateral carotid artery disease ([place]) 09/26/2012   carotid US (8/14):  R 80-99; L 60-79 => s/p R CEA 10/2012 (Dr. Donnetta Hutching)  . Bladder cancer ([place]) 04/26/2013  . Bladder tumor   . CAD (coronary artery disease)    a. LHC (10/07/12):  Ostial LM 60%, distal LM 40-50%, oLAD 40-50%, mLAD 70-80%, pCFX subtotally occluded, mCFX 80-90%, oRCA occluded, ostial L subclavian 40%, EF 25-30%, abdominal aorta diffusely diseased with severe ulcerated plaquing (right iliac ostial 95%; left renal long 60-70%, right okay). CO 2.1, CI 1.5 => s/p CABG (L-LAD, S-OM, S-PDA) 10/2012 with Dr. Cyndia Bent   . Cancer ([place])    bladder  . Chronic systolic CHF (congestive heart failure) ([place]) 08/04/2012   Echo (08/17/12): EF 30%, diffuse HK, mild MR, mild BAE, PASP.;  Echo (01/2013): EF 40-45%, inferior posterior HK, Gr 2 DD, mild MR, mild LAE  . COPD (chronic obstructive pulmonary disease) ([place])   . Diabetes mellitus  ([place]) TYPE II--  PER PCP NOTE DR Cathlean Cower   PER PT DOES NOT TAKES METFORMIN PRESCRIBED, SHE WATCHES DIET AND EXERCISES  . H/O hiatal hernia    s/p  75 yrs old  . Hyperkalemia    ACEI d/c'd 11/2012  . Hyperlipidemia   . Ischemic cardiomyopathy   . Lung cancer ([place])   . Lung nodule    chest CT 09/2012 => needs repeat by 03/2013  . PAD (peripheral artery disease) ([place])   . Positive TB test   . Radiation 4/26,4/28,5/3,5/5,5/10   Left lung 5 fractions at 10 Gy    Family History  Problem Relation Age of Onset  . Cancer Other     colon cancer  . Hypertension Other   . Kidney disease Other   . Diabetes Other   . Heart disease Mother     MI at age 63  . Cervical cancer Mother   . Cancer Mother   . Diabetes Mother   . Hypertension Mother   . Lung cancer Father 36    primary site lung CA then colon and bone  . Colon cancer Father   . Cancer Father   . Hypertension Father     SOCIAL HISTORY: Social History  Substance Use Topics  . Smoking status: Former Smoker    Packs/day: 1.00    Years: 50.00    Types: Cigarettes    Quit date: 04/23/2013  . Smokeless tobacco: Never Used  . Alcohol use No     Comment: occasional wine  not often    No Known Allergies  Current Outpatient Prescriptions  Medication Sig Dispense Refill  . aspirin EC 325 MG EC tablet Take 1 tablet (325 mg total) by mouth daily. 30 tablet 0  . Blood Glucose Monitoring Suppl (ONE TOUCH ULTRA 2) w/Device KIT Use to check blood sugars daily 1 each 0  . carvedilol (COREG) 12.5 MG tablet TAKE 1 AND 1/2 TABLETS BY MOUTH TWICE DAILY WITH A MEAL 270 tablet 2  . furosemide (LASIX) 20 MG tablet Take 1 tablet (20 mg total) by mouth daily. 90 tablet 2  . glucose blood (ONE TOUCH ULTRA TEST) test strip 1 each by Other route 4 (four) times daily. Use to check blood sugar four times a day Dx E11.9 400 each 3  . hydrALAZINE (APRESOLINE) 25 MG tablet Take 1 tablet (25 mg total) by mouth 3 (three) times daily. 270 tablet 2  .  Hypromellose (ARTIFICIAL TEARS OP) Place 1 drop into both eyes 4 (four) times daily as needed (for dry eyes).    . isosorbide mononitrate (IMDUR) 30 MG 24 hr tablet Take 1 tablet (30 mg total) by mouth daily. 90 tablet 2  . Lancets (ONETOUCH ULTRASOFT) lancets 1 each by Other route 4 (four) times daily. Use to help check blood sugars four times a day Dx E11.9 400 each 3  . metFORMIN (GLUCOPHAGE) 500 MG tablet TAKE 1 TABLET TWICE DAILY WITH A MEAL 180 tablet 2  . Multiple Vitamin (MULTIVITAMIN) tablet Take 1 tablet by mouth daily.    . Omega-3 Fatty Acids (FISH OIL) 1000 MG CAPS Take 1,000 mg by mouth daily.     . pravastatin (PRAVACHOL) 40 MG tablet Take 1 tablet (40 mg total) by mouth every evening. 90 tablet 2   No current facility-administered medications for this visit.     REVIEW OF SYSTEMS:  _0  denotes positive finding, _1  denotes negative finding Cardiac  Comments:  Chest pain or chest pressure:    Shortness of breath upon exertion:    Short of breath when lying flat:    Irregular heart rhythm:        Vascular    Pain in calf, thigh, or hip brought on by ambulation:    Pain in feet at night that wakes you up from your sleep:     Blood clot in your veins:    Leg swelling:         Pulmonary    Oxygen at home:    Productive cough:     Wheezing:         Neurologic    Sudden weakness in arms or legs:     Sudden numbness in arms or legs:     Sudden onset of difficulty speaking or slurred speech:    Temporary loss of vision in one eye:     Problems with dizziness:         Gastrointestinal    Blood in stool:     Vomited blood:         Genitourinary    Burning when urinating:     Blood in urine:        Psychiatric    Major depression:         Hematologic    Bleeding problems:    Problems with blood clotting too easily:        Skin    Rashes or ulcers:        Constitutional    Fever or chills:  PHYSICAL EXAM: Vitals:   10/22/15 1557 10/22/15 1608  10/22/15 1609  BP: 126/78 (!) 174/68 (!) 173/68  Pulse: 62    Resp: 18    Temp: 97.4 F (36.3 C)    TempSrc: Oral    SpO2: 99%    Weight: 94 lb 9.6 oz (42.9 kg)    Height: 5' 0.5" (1.537 m)      GENERAL: The patient is a well-nourished female, in no acute distress. The vital signs are documented above. CARDIAC: There is a regular rate and rhythm. Harsh bruits bilaterally. VASCULAR: 2+ radial pulses symmetric PULMONARY: There is good air exchange bilaterally without wheezing or rales. MUSCULOSKELETAL: There are no major deformities or cyanosis. NEUROLOGIC: No focal weakness or paresthesias are detected. SKIN: There are no ulcers or rashes noted. PSYCHIATRIC: The patient has a normal affect.  DATA:  Carotid duplex 10/22/2015  Left proximal ICA peak systolic velocity of 390 cm/s with end-diastolic velocity of 300 cm/s suggestive of 80-99% stenosis Right carotid endarterectomy site is patent without evidence of restenosis. Vertebral arteries are patent and with antegrade flow bilaterally. There is a high velocity in the left vertebral artery.  MEDICAL ISSUES: Asymptomatic high grade left internal carotid artery stenosis  The patient has had continued progression of her left internal carotid artery disease. She has reached threshold for surgery. Recommend left carotid endarterectomy for stroke prevention. Her right carotid endarterectomy site is patent. She will need to see Dr. Stanford Breed for preoperative cardiac clearance. Her surgery will then be scheduled at her convenience.   Virgina Jock, PA-C [facility] [place]     I have examined the patient, reviewed and agree with above. Patient remains asymptomatic from the standpoint of her carotid stenosis. Had moderate to severe stenosis in her left carotid. She denies any amaurosis fugax, transient ischemic attack or stroke. Today's duplex shows progression with high-grade stenosis greater than 80%. Ended  endarterectomy for reduction of stroke risk. She understands procedure and the 1-2% risk of surgery with stroke and also liver slight risk of cranial nerve injury. We'll proceed after her upcoming visit with Dr. Wynn Maudlin, MD 10/22/2015 4:57 PM

## 2015-11-08 NOTE — Op Note (Signed)
     Patient name: Tonya Valdez MRN: [id number] DOB: 04-23-1940 Sex: female  11/08/2015 Pre-operative Diagnosis: Asymptomatic left carotid stenosis Post-operative diagnosis:  Same Surgeon:  Rosetta Posner, M.D. Assistants:  Rhyne Procedure:    left carotid Endarterectomy with Dacron patch angioplasty Anesthesia:  General Blood Loss:  See anesthesia record   Indications for surgery:  Asymptomatic  Procedure in detail:  The patient was taken to the operating and placed in the supine position. The neck was prepped and draped in the usual sterile fashion. An incision was made anterior to the sternocleidomastoid muscle and continued with electrocautery through the platysma muscle. The muscle was retracted posteriorly and the carotid sheath was opened. The facial vein was ligated with 2-0 silk ties and divided. The common carotid artery was encircled with an umbilical tape and Rummel tourniquet. Dissection was continued onto the carotid bifurcation. The superior thyroid artery was controlled with a 2-0 silk Potts tie. The external carotid organ was encircled with a vessel loop and the internal carotid was encircled with umbilical tape and Rummel tourniquet. The hypoglossal and vagus nerves were identified and preserved.  The patient was given systemic heparinization. After adequate circulation time, the internal,external and common carotid arteries were occluded. The common carotid was opened with an 11 blade and the arteriotomy was continued with Potts scissors onto the internal carotid artery. A 10 shunt was passed up the internal carotid artery, allowed to back bleed, and then passed down the common carotid artery. The shunt was secured with Rummel tourniquet. The endarterectomy was begun on the common carotid artery  plaque was divided proximally with Potts scissors. The endarterectomy was continued onto the carotid bifurcation. The external carotid was endarterectomized by eversion technique and the  internal carotid artery was endarterectomized in an open fashion. Remaining debris was removed from the endarterectomy plane. A Dacron patch was brought to the field and sewn as a patch angioplasty. Prior to completing the anastomosis, the shunt was removed and the usual flushing maneuvers were undertaken. The anastomosis was then completed and flow was restored first to the external and then the internal carotid artery. Excellent flow characteristics were noted with hand-held Doppler in the internal and external carotid arteries.  The patient was given protamine to reverse the heparin. Hemostasis was obtained with electrocautery. The wounds were irrigated with saline. The wound was closed by first reapproximating the sternocleidomastoid muscle over the carotid artery with interrupted 3-0 Vicryl sutures. Next, the platysma was closed with a running 3-0 Vicryl suture. The skin was closed with a 4-0 subcuticular Vicryl suture. Benzoin and Steri-Strips were applied to the incision. A sterile dressing was placed over the incision. All sponge and needle counts were correct. The patient was awakened in the operating room, neurologically intact. They were transferred to the PACU in stable condition.  Carotid stenosis at surgery:90%  Disposition:  To PACU in stable condition,neurologically intact  Relevant Operative Details:  Normal bifurcation location  Rosetta Posner, M.D. [facility] Office: [phone] Pager:  [phone] n-

## 2015-11-08 NOTE — Transfer of Care (Signed)
Immediate Anesthesia Transfer of Care Note  Patient: Tonya Valdez  Procedure(s) Performed: Procedure(s): ENDARTERECTOMY CAROTID WITH PATCH ANGIOPLASTY (Left)  Patient Location: PACU  Anesthesia Type:General  Level of Consciousness: awake, alert , oriented, patient cooperative and responds to stimulation  Airway & Oxygen Therapy: Patient Spontanous Breathing and Patient connected to nasal cannula oxygen  Post-op Assessment: Report given to RN, Post -op Vital signs reviewed and stable, Patient moving all extremities, Patient moving all extremities X 4 and Patient able to stick tongue midline  Post vital signs: Reviewed and stable  Last Vitals:  Vitals:   11/08/15 0603  BP: (!) 154/40  Pulse: 61  Resp: 18  Temp: 36.6 C    Last Pain:  Vitals:   11/08/15 0603  TempSrc: Oral      Patients Stated Pain Goal: 3 (12/23/11 1438)  Complications: No apparent anesthesia complications

## 2015-11-09 LAB — CBC
HEMATOCRIT: 26.2 % — AB (ref 36.0–46.0)
HEMOGLOBIN: 8.4 g/dL — AB (ref 12.0–15.0)
MCH: 30.2 pg (ref 26.0–34.0)
MCHC: 32.1 g/dL (ref 30.0–36.0)
MCV: 94.2 fL (ref 78.0–100.0)
Platelets: 157 10*3/uL (ref 150–400)
RBC: 2.78 MIL/uL — ABNORMAL LOW (ref 3.87–5.11)
RDW: 14.2 % (ref 11.5–15.5)
WBC: 8.1 10*3/uL (ref 4.0–10.5)

## 2015-11-09 LAB — GLUCOSE, CAPILLARY: Glucose-Capillary: 102 mg/dL — ABNORMAL HIGH (ref 65–99)

## 2015-11-09 LAB — BASIC METABOLIC PANEL
Anion gap: 6 (ref 5–15)
BUN: 13 mg/dL (ref 6–20)
CO2: 24 mmol/L (ref 22–32)
CREATININE: 0.84 mg/dL (ref 0.44–1.00)
Calcium: 8.3 mg/dL — ABNORMAL LOW (ref 8.9–10.3)
Chloride: 105 mmol/L (ref 101–111)
GFR calc Af Amer: >60 mL/min (ref >60)
GFR calc non Af Amer: >60 mL/min (ref >60)
GLUCOSE: 92 mg/dL (ref 65–99)
POTASSIUM: 4 mmol/L (ref 3.5–5.1)
Sodium: 135 mmol/L (ref 135–145)

## 2015-11-09 NOTE — Progress Notes (Addendum)
[facility]  Subjective  - feels ok    Objective (!) 145/73 (!) 51 98.5 F (36.9 C) (Oral) 17 97%  Intake/Output Summary (Last 24 hours) at 11/09/15 0754 Last data filed at 11/09/15 0300  Gross per 24 hour  Intake             1075 ml  Output              605 ml  Net              470 ml   No hematoma in neck Tongue midline Neuro symmetric UE/LE 5/5 motor no facial asymmetry  Assessment/Planning: Acute blood loss anemia, asymptomatic D/c home today   Ruta Hinds 11/09/2015 7:54 AM --  Laboratory Lab Results:  Recent Labs  11/06/15 0853 11/09/15 0225  WBC 12.0* 8.1  HGB 11.5* 8.4*  HCT 34.8* 26.2*  PLT 224 157   BMET  Recent Labs  11/06/15 0853 11/09/15 0225  NA 139 135  K 3.5 4.0  CL 103 105  CO2 27 24  GLUCOSE 153* 92  BUN 14 13  CREATININE 0.83 0.84  CALCIUM 9.3 8.3*    COAG Lab Results  Component Value Date   INR 1.05 11/06/2015   INR 1.01 04/26/2014   INR 1.08 12/27/2013   No results found for: PTT

## 2015-11-09 NOTE — Progress Notes (Signed)
Patient taken out by NA in wheelchair for d/c home.

## 2015-11-09 NOTE — Progress Notes (Signed)
Discharge instructions reviewed with patient.  Prescription for Percocet provided.  Awaiting family for transport home.

## 2015-11-11 ENCOUNTER — Encounter (HOSPITAL_COMMUNITY): Payer: Self-pay | Admitting: Vascular Surgery

## 2015-11-11 NOTE — Discharge Summary (Signed)
[facility] Discharge Summary  Tonya Valdez 1941-02-12 75 y.o. female  [id number]  Admission Date: 11/08/2015  Discharge Date: 11/09/2015  Physician: Curt Jews, MD  Admission Diagnosis: Left carotid stenosis   HPI:   This is a 75 y.o. female who presented for continued follow-up of her bilateral carotid artery disease. She previously had a combined right carotid endarterectomy and CABG in 2014. At her last office visit in February 2017, there was a 60-79% left internal carotid artery stenosis. She denies any amaurosis fugax, sudden onset weakness or numbness of her extremities, expressive or receptive aphasia.  She previously underwent radiation for right lower lobe lung cancer. She recently had a chest CT this month that showed no evidence of disease. Her primary cardiologist is Dr. Stanford Breed. She has an office visit with him coming up soon. She takes a daily aspirin. She's on a statin for hyperlipidemia.  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 She is able to complete her daily activities without difficulty.  Hospital Course:  The patient was admitted to the hospital and taken to the operating room on 11/08/2015 and underwent left carotid endarterectomy.  The patient tolerated the procedure well and was transported to the PACU in stable condition.  By POD 1, the patient's neuro status was intact. She had no hematoma at her left neck. She had acute blood loss anemia that was asymptomatic. She was discharged home on POD 1 in good condition.     Recent Labs  11/09/15 0225  NA 135  K 4.0  CL 105  CO2 24  GLUCOSE 92  BUN 13  CALCIUM 8.3*    Recent Labs  11/09/15 0225  WBC 8.1  HGB 8.4*  HCT 26.2*  PLT 157   No results for input(s): INR in the last 72 hours.  Discharge Instructions:   The patient is discharged to home with extensive instructions on wound care and progressive ambulation.  They are instructed not to drive or perform any  heavy lifting until returning to see the physician in his office.  Discharge Instructions    CAROTID Sugery: Call MD for difficulty swallowing or speaking; weakness in arms or legs that is a new symtom; severe headache.  If you have increased swelling in the neck and/or  are having difficulty breathing, CALL [phone]    Complete by:  As directed    Call MD for:  redness, tenderness, or signs of infection (pain, swelling, bleeding, redness, odor or green/yellow discharge around incision site)    Complete by:  As directed    Call MD for:  severe or increased pain, loss or decreased feeling  in affected limb(s)    Complete by:  As directed    Call MD for:  temperature >100.5    Complete by:  As directed    Discharge wound care:    Complete by:  As directed    Shower daily with soap and water starting 11/10/15   Driving Restrictions    Complete by:  As directed    No driving for 2 weeks   Lifting restrictions    Complete by:  As directed    No lifting for 2 weeks   Resume previous diet    Complete by:  As directed       Discharge Diagnosis:  Left carotid stenosis   Secondary Diagnosis: Patient Active Problem List   Diagnosis Date Noted  . Left carotid artery stenosis 11/08/2015  . Primary malignant neoplasm of  right lower lobe of lung ([place]) 10/16/2015  . ACS (acute coronary syndrome) ([place]) 05/07/2014  . Shortness of breath 05/07/2014  . Non-small cell cancer of left lung ([place]) 05/07/2014  . Chest pain 05/07/2014  . Carotid stenosis 12/26/2013  . Essential hypertension 05/01/2013  . Bladder cancer ([place]) 04/26/2013  . Chronic systolic heart failure ([place]) 04/26/2013  . CAD (coronary artery disease) 11/10/2012  . Cardiomyopathy, ischemic 11/10/2012  . Peripheral vascular disease ([place]) 11/10/2012  . Occlusion and stenosis of carotid artery without mention of cerebral infarction 10/18/2012  . Bilateral carotid artery disease ([place]) 09/26/2012  . Hypotension, unspecified 08/24/2012  .  Peripheral edema 08/04/2012  . Solitary pulmonary nodule 08/04/2012  . Chronic systolic CHF (congestive heart failure) ([place]) 08/04/2012  . Dysuria 04/26/2012  . Weight loss 01/15/2012  . Diabetes ([place]) 10/10/2011  . Former smoker 10/07/2011  . COPD (chronic obstructive pulmonary disease) ([place]) 10/07/2011  . Allergic rhinitis, cause unspecified   . Positive TB test   . Preventative health care 10/02/2011   Past Medical History:  Diagnosis Date  . Bilateral carotid artery disease ([place]) 09/26/2012   carotid US (8/14):  R 80-99; L 60-79 => s/p R CEA 10/2012 (Dr. Donnetta Hutching)  . Bladder cancer ([place]) 04/26/2013  . Bladder tumor   . CAD (coronary artery disease)    a. LHC (10/07/12):  Ostial LM 60%, distal LM 40-50%, oLAD 40-50%, mLAD 70-80%, pCFX subtotally occluded, mCFX 80-90%, oRCA occluded, ostial L subclavian 40%, EF 25-30%, abdominal aorta diffusely diseased with severe ulcerated plaquing (right iliac ostial 95%; left renal long 60-70%, right okay). CO 2.1, CI 1.5 => s/p CABG (L-LAD, S-OM, S-PDA) 10/2012 with Dr. Cyndia Bent   . Cancer ([place])    bladder  . Cataract    left eye  . Chronic systolic CHF (congestive heart failure) ([place]) 08/04/2012   Echo (08/17/12): EF 30%, diffuse HK, mild MR, mild BAE, PASP.;  Echo (01/2013): EF 40-45%, inferior posterior HK, Gr 2 DD, mild MR, mild LAE  . COPD (chronic obstructive pulmonary disease) ([place])   . Diabetes mellitus ([place]) PCP DR Cathlean Cower   Type II, PER PT DOES NOT TAKES METFORMIN PRESCRIBED, SHE WATCHES DIET AND EXERCISES  . H/O hiatal hernia    s/p  75 yrs old  . Hyperkalemia    ACEI d/c'd 11/2012  . Hyperlipidemia   . Hypertension    controlled with medications  . Ischemic cardiomyopathy   . Lung cancer ([place])   . Lung nodule    chest CT 09/2012 => needs repeat by 03/2013  . PAD (peripheral artery disease) ([place])   . 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 Radiation 4/26,4/28,5/3,5/5,5/10   Left lung 5  fractions at 10 Gy  . Wears glasses       Medication List    TAKE these medications   ARTIFICIAL TEARS OP Place 1 drop into both eyes 4 (four) times daily as needed (for dry eyes).   aspirin 325 MG EC tablet Take 1 tablet (325 mg total) by mouth daily.   carvedilol 12.5 MG tablet Commonly known as:  COREG TAKE 1 AND 1/2 TABLETS BY MOUTH TWICE DAILY WITH A MEAL   Fish Oil 1000 MG Caps Take 1,000 mg by mouth daily.   furosemide 20 MG tablet Commonly known as:  LASIX Take 1 tablet (20 mg total) by mouth daily.   glucose blood test strip Commonly known as:  ONE TOUCH  ULTRA TEST 1 each by Other route 4 (four) times daily. Use to check blood sugar four times a day Dx E11.9   hydrALAZINE 25 MG tablet Commonly known as:  APRESOLINE Take 1 tablet (25 mg total) by mouth 3 (three) times daily.   isosorbide mononitrate 30 MG 24 hr tablet Commonly known as:  IMDUR Take 1 tablet (30 mg total) by mouth daily.   metFORMIN 500 MG tablet Commonly known as:  GLUCOPHAGE TAKE 1 TABLET TWICE DAILY WITH A MEAL   multivitamin tablet Take 1 tablet by mouth daily.   ONE TOUCH ULTRA 2 w/Device Kit Use to check blood sugars daily   onetouch ultrasoft lancets 1 each by Other route 4 (four) times daily. Use to help check blood sugars four times a day Dx E11.9   oxyCODONE-acetaminophen 5-325 MG tablet Commonly known as:  PERCOCET Take 1 tablet by mouth every 6 (six) hours as needed for severe pain.   pravastatin 40 MG tablet Commonly known as:  PRAVACHOL Take 1 tablet (40 mg total) by mouth every evening.       Percocet #8 No Refill  Disposition: Home  Patient's condition: is Good  Follow up: 1. Dr.  Donnetta Hutching in 2 weeks.   Virgina Jock, PA-C [facility] [phone]  --- For [facility] use --- Instructions: Press F2 to tab through selections.  Delete question if not applicable.   Modified Rankin score at D/C (0-6): 0  IV medication needed for:  1.  Hypertension: No 2. Hypotension: No  Post-op Complications: No  1. Post-op CVA or TIA: No  2. CN injury: No  3. Myocardial infarction: No  4.  CHF: No  5.  Dysrhythmia (new): No  6. Wound infection: No  7. Reperfusion symptoms: No  8. Return to OR: No  Discharge medications: Statin use:  Yes If No: _0  For Medical reasons, _1  Non-compliant, _2  Not-indicated ASA use:  Yes  If No: _3  For Medical reasons, _4  Non-compliant, _5  Not-indicated Beta blocker use:  Yes If No: _6  For Medical reasons, _7  Non-compliant, _8  Not-indicated ACE-Inhibitor use:  No If No: _9  For Medical reasons, _10  Non-compliant, _11  Not-indicated P2Y12 Antagonist use: No, _12  Plavix, _13  Plasugrel, _14  Ticlopinine, _15  Ticagrelor, _16  Other, _17  No for medical reason, _18  Non-compliant, [x ] Not-indicated Anti-coagulant use:  No, _19  Warfarin, _20  Rivaroxaban, _21  Dabigatran, _22  Other, _23  No for medical reason, _24  Non-compliant, [x ] Not-indicated

## 2015-11-12 ENCOUNTER — Encounter: Payer: Self-pay | Admitting: Vascular Surgery

## 2015-11-14 ENCOUNTER — Telehealth: Payer: Self-pay | Admitting: Vascular Surgery

## 2015-11-14 DIAGNOSIS — H04123 Dry eye syndrome of bilateral lacrimal glands: Secondary | ICD-10-CM | POA: Diagnosis not present

## 2015-11-14 DIAGNOSIS — H10413 Chronic giant papillary conjunctivitis, bilateral: Secondary | ICD-10-CM | POA: Diagnosis not present

## 2015-11-14 DIAGNOSIS — H2513 Age-related nuclear cataract, bilateral: Secondary | ICD-10-CM | POA: Diagnosis not present

## 2015-11-14 DIAGNOSIS — E119 Type 2 diabetes mellitus without complications: Secondary | ICD-10-CM | POA: Diagnosis not present

## 2015-11-14 LAB — HM DIABETES EYE EXAM

## 2015-11-14 NOTE — Telephone Encounter (Signed)
-----   Message from Mena Goes, RN sent at 11/08/2015  9:28 AM EDT ----- Regarding: schedule   ----- Message ----- From: Gabriel Earing, PA-C Sent: 11/08/2015   9:05 AM To: [facility] Charge Pool  S/p left CEA 11/08/15.  F/u with Dr. Donnetta Hutching in 2 weeks.  Thanks, Aldona Bar

## 2015-11-14 NOTE — Telephone Encounter (Signed)
Unable to leave mssg home # just rings will mail letter for appt

## 2015-11-19 ENCOUNTER — Encounter: Payer: Self-pay | Admitting: Vascular Surgery

## 2015-11-19 ENCOUNTER — Ambulatory Visit (INDEPENDENT_AMBULATORY_CARE_PROVIDER_SITE_OTHER): Payer: Self-pay | Admitting: Vascular Surgery

## 2015-11-19 VITALS — BP 168/74 | HR 65 | Temp 99.1°F | Resp 18 | Ht 61.0 in | Wt 96.5 lb

## 2015-11-19 DIAGNOSIS — I6522 Occlusion and stenosis of left carotid artery: Secondary | ICD-10-CM

## 2015-11-19 NOTE — Progress Notes (Signed)
   Patient name: Tonya Valdez MRN: [id number] DOB: [birth year] Sex: female  REASON FOR VISIT: Here for follow-up of left carotid endarterectomy for severe asymptomatic carotid stenosis on 11/08/2015.   HPI: Tonya Valdez is a 75 y.o. female here for follow-up. She did well the hospital was discharged home to postop day 1. She remains neurologically intact. She does have mild soreness postoperatively.  Current Outpatient Prescriptions  Medication Sig Dispense Refill  . aspirin EC 325 MG EC tablet Take 1 tablet (325 mg total) by mouth daily. 30 tablet 0  . Blood Glucose Monitoring Suppl (ONE TOUCH ULTRA 2) w/Device KIT Use to check blood sugars daily 1 each 0  . carvedilol (COREG) 12.5 MG tablet TAKE 1 AND 1/2 TABLETS BY MOUTH TWICE DAILY WITH A MEAL 270 tablet 2  . furosemide (LASIX) 20 MG tablet Take 1 tablet (20 mg total) by mouth daily. 90 tablet 2  . glucose blood (ONE TOUCH ULTRA TEST) test strip 1 each by Other route 4 (four) times daily. Use to check blood sugar four times a day Dx E11.9 400 each 3  . hydrALAZINE (APRESOLINE) 25 MG tablet Take 1 tablet (25 mg total) by mouth 3 (three) times daily. 270 tablet 2  . Hypromellose (ARTIFICIAL TEARS OP) Place 1 drop into both eyes 4 (four) times daily as needed (for dry eyes).    . isosorbide mononitrate (IMDUR) 30 MG 24 hr tablet Take 1 tablet (30 mg total) by mouth daily. 90 tablet 2  . Lancets (ONETOUCH ULTRASOFT) lancets 1 each by Other route 4 (four) times daily. Use to help check blood sugars four times a day Dx E11.9 400 each 3  . metFORMIN (GLUCOPHAGE) 500 MG tablet TAKE 1 TABLET TWICE DAILY WITH A MEAL 180 tablet 2  . Multiple Vitamin (MULTIVITAMIN) tablet Take 1 tablet by mouth daily.    . Omega-3 Fatty Acids (FISH OIL) 1000 MG CAPS Take 1,000 mg by mouth daily.     [name] oxyCODONE-acetaminophen (PERCOCET) 5-325 MG tablet Take 1 tablet by mouth every 6 (six) hours as needed for severe pain. 8 tablet 0    . pravastatin (PRAVACHOL) 40 MG tablet Take 1 tablet (40 mg total) by mouth every evening. 90 tablet 2   No current facility-administered medications for this visit.      PHYSICAL EXAM: Vitals:   11/19/15 1506 11/19/15 1512 11/19/15 1514 11/19/15 1515  BP: (!) 180/70 (!) 176/76 (!) 174/74 (!) 168/74  Pulse: 65     Resp: 18     Temp: 99.1 F (37.3 C)     TempSrc: Oral     SpO2: 99%     Weight: 43.8 kg (96 lb 8 oz)     Height: _0  (1.549 m)       GENERAL: The patient is a well-nourished female, in no acute distress. The vital signs are documented above. Left neck incision well-healed. She does have bilateral carotid bruits. Neurologically she is intact.  MEDICAL ISSUES: Stable status post carotid endarterectomy for asymptomatic disease. She will return to usual function. Plan to see her again in 6 months with carotid duplex. She'll notify should she develop any neurologic deficits   Rosetta Posner, MD [facility] Tel [phone] Pager [phone]

## 2015-11-19 NOTE — Progress Notes (Signed)
Vitals:   11/19/15 1506 11/19/15 1512 11/19/15 1514 11/19/15 1515  BP: (!) 180/70 (!) 176/76 (!) 174/74 (!) 168/74  Pulse: 65     Resp: 18     Temp: 99.1 F (37.3 C)     TempSrc: Oral     SpO2: 99%     Weight: 96 lb 8 oz (43.8 kg)     Height: '5\' 1"'$  (1.549 m)

## 2015-12-16 ENCOUNTER — Ambulatory Visit: Payer: PPO | Admitting: Cardiology

## 2016-01-13 ENCOUNTER — Other Ambulatory Visit: Payer: Self-pay | Admitting: Internal Medicine

## 2016-01-14 ENCOUNTER — Other Ambulatory Visit: Payer: Self-pay | Admitting: *Deleted

## 2016-01-14 MED ORDER — HYDRALAZINE HCL 25 MG PO TABS: 25.0000 mg | ORAL_TABLET | Freq: Three times a day (TID) | ORAL | 2 refills | Status: DC

## 2016-01-14 MED ORDER — PRAVASTATIN SODIUM 40 MG PO TABS: 40.0000 mg | ORAL_TABLET | Freq: Every evening | ORAL | 2 refills | Status: DC

## 2016-01-14 MED ORDER — CARVEDILOL 12.5 MG PO TABS: ORAL_TABLET | ORAL | 2 refills | Status: DC

## 2016-01-14 MED ORDER — ISOSORBIDE MONONITRATE ER 30 MG PO TB24: 30.0000 mg | ORAL_TABLET | Freq: Every day | ORAL | 2 refills | Status: DC

## 2016-01-15 ENCOUNTER — Other Ambulatory Visit: Payer: Self-pay

## 2016-01-15 MED ORDER — CARVEDILOL 12.5 MG PO TABS: ORAL_TABLET | ORAL | 3 refills | Status: DC

## 2016-01-15 MED ORDER — HYDRALAZINE HCL 25 MG PO TABS: 25.0000 mg | ORAL_TABLET | Freq: Three times a day (TID) | ORAL | 3 refills | Status: DC

## 2016-01-15 MED ORDER — PRAVASTATIN SODIUM 40 MG PO TABS: 40.0000 mg | ORAL_TABLET | Freq: Every evening | ORAL | 2 refills | Status: DC

## 2016-01-15 MED ORDER — ISOSORBIDE MONONITRATE ER 30 MG PO TB24: 30.0000 mg | ORAL_TABLET | Freq: Every day | ORAL | 1 refills | Status: DC

## 2016-01-22 ENCOUNTER — Other Ambulatory Visit: Payer: Self-pay | Admitting: *Deleted

## 2016-01-22 ENCOUNTER — Telehealth: Payer: Self-pay | Admitting: Cardiology

## 2016-01-22 MED ORDER — PRAVASTATIN SODIUM 40 MG PO TABS: 40.0000 mg | ORAL_TABLET | Freq: Every evening | ORAL | 1 refills | Status: DC

## 2016-01-22 MED ORDER — HYDRALAZINE HCL 25 MG PO TABS: 25.0000 mg | ORAL_TABLET | Freq: Three times a day (TID) | ORAL | 1 refills | Status: DC

## 2016-01-22 MED ORDER — CARVEDILOL 12.5 MG PO TABS: ORAL_TABLET | ORAL | 1 refills | Status: DC

## 2016-01-22 MED ORDER — ISOSORBIDE MONONITRATE ER 30 MG PO TB24: 30.0000 mg | ORAL_TABLET | Freq: Every day | ORAL | 1 refills | Status: DC

## 2016-01-22 NOTE — Telephone Encounter (Signed)
°*  STAT* If patient is at the pharmacy, call can be transferred to refill team.   1. Which medications need to be refilled? (please list name of each medication and dose if known)  Hydralazine '25mg'$  Pravastatin sodium '40mg'$  Carvedilol 12.'5mg'$  Isosorbide mner '30mg'$    2. Which pharmacy/location (including street and city if local pharmacy) is medication to be sent to? [facility] [phone]  0. Do they need a 30 day or 90 day supply? 90day    Pt stated her medication was sent to wrong pharmacy which was [facility] and she is very upset.

## 2016-01-22 NOTE — Addendum Note (Signed)
Addended by: Lianne Cure A on: 01/22/2016 02:09 PM   Modules accepted: Orders

## 2016-01-31 ENCOUNTER — Ambulatory Visit: Payer: PPO | Admitting: Cardiology

## 2016-02-10 ENCOUNTER — Encounter (HOSPITAL_COMMUNITY): Payer: Self-pay

## 2016-02-10 ENCOUNTER — Ambulatory Visit (HOSPITAL_COMMUNITY)
Admission: RE | Admit: 2016-02-10 | Discharge: 2016-02-10 | Disposition: A | Discharge status: Home / Self Care | Payer: PPO | Source: Ambulatory Visit | Attending: Radiation Oncology | Admitting: Radiation Oncology

## 2016-02-10 DIAGNOSIS — S2231XD Fracture of one rib, right side, subsequent encounter for fracture with routine healing: Secondary | ICD-10-CM | POA: Insufficient documentation

## 2016-02-10 DIAGNOSIS — C3431 Malignant neoplasm of lower lobe, right bronchus or lung: Secondary | ICD-10-CM | POA: Insufficient documentation

## 2016-02-10 DIAGNOSIS — I700 Atherosclerosis of aorta: Secondary | ICD-10-CM | POA: Diagnosis not present

## 2016-02-10 DIAGNOSIS — I2510 Atherosclerotic heart disease of native coronary artery without angina pectoris: Secondary | ICD-10-CM | POA: Insufficient documentation

## 2016-02-10 DIAGNOSIS — X58XXXD Exposure to other specified factors, subsequent encounter: Secondary | ICD-10-CM | POA: Insufficient documentation

## 2016-02-10 DIAGNOSIS — J439 Emphysema, unspecified: Secondary | ICD-10-CM | POA: Insufficient documentation

## 2016-02-18 NOTE — Progress Notes (Signed)
HPI: FU CAD. Echo (08/17/12): EF 30%, diffuse HK, mild MR, mild BAE, PASP. Abdominal US (8/14): No AAA. LHC (10/07/12): oLM 60%, dLM 40-50%, oLAD 40-50%, mLAD 70-80%, pCFX subtotally occluded, mCFX 80-90%, oRCA occluded, ostial L subclavian 40%, EF 25-30%, abdominal aorta diffusely diseased with severe ulcerated plaquing (right iliac ostial 95%; left renal long 60-70%, right okay). CO 2.1, CI 1.5. Pre-CABG ABIs: normal. She underwent CABG 11/14/12 (LIMA-LAD, SVG-OM, SVG-PDA) along with right CEA. Developed hyperkalemia with ACEI. Last echocardiogram March 2016 showed normal LV function, grade 2 diastolic dysfunction, moderate left atrial enlargement, mild mitral regurgitation and tricuspid regurgitation. Nuclear study March 2016 showed ejection fraction 47%. There was inferior apical ischemia and inferior basal scar. Event monitor 3/16 Showed sinus with PVCs. Also being treated for lung cancer. Since I last saw her, the patient has dyspnea with more extreme activities but not with routine activities. It is relieved with rest. It is not associated with chest pain. There is no orthopnea, PND or pedal edema. There is no syncope or palpitations. There is no exertional chest pain.   Current Outpatient Prescriptions  Medication Sig Dispense Refill  . aspirin EC 325 MG EC tablet Take 1 tablet (325 mg total) by mouth daily. 30 tablet 0  . Blood Glucose Monitoring Suppl (ONE TOUCH ULTRA 2) w/Device KIT Use to check blood sugars daily 1 each 0  . carvedilol (COREG) 12.5 MG tablet TAKE 1 AND 1/2 TABLETS BY MOUTH TWICE DAILY WITH A MEAL 270 tablet 1  . furosemide (LASIX) 20 MG tablet Take 1 tablet by mouth daily 90 tablet 1  . glucose blood (ONE TOUCH ULTRA TEST) test strip 1 each by Other route 4 (four) times daily. Use to check blood sugar four times a day Dx E11.9 400 each 3  . hydrALAZINE (APRESOLINE) 25 MG tablet Take 1 tablet (25 mg total) by mouth 3 (three) times daily. 270 tablet 1  . Hypromellose  (ARTIFICIAL TEARS OP) Place 1 drop into both eyes 4 (four) times daily as needed (for dry eyes).    . isosorbide mononitrate (IMDUR) 30 MG 24 hr tablet Take 1 tablet (30 mg total) by mouth daily. 90 tablet 1  . Lancets (ONETOUCH ULTRASOFT) lancets 1 each by Other route 4 (four) times daily. Use to help check blood sugars four times a day Dx E11.9 400 each 3  . metFORMIN (GLUCOPHAGE) 500 MG tablet Take 1 tablet by mouth twice a day with a meal 180 tablet 1  . Multiple Vitamin (MULTIVITAMIN) tablet Take 1 tablet by mouth daily.    . Omega-3 Fatty Acids (FISH OIL) 1000 MG CAPS Take 1,000 mg by mouth daily.     . pravastatin (PRAVACHOL) 40 MG tablet Take 1 tablet (40 mg total) by mouth every evening. 90 tablet 1   No current facility-administered medications for this visit.      Past Medical History:  Diagnosis Date  . Bilateral carotid artery disease ([place]) 09/26/2012   carotid US (8/14):  R 80-99; L 60-79 => s/p R CEA 10/2012 (Dr. Donnetta Hutching)  . Bladder cancer ([place]) 04/26/2013  . Bladder tumor   . CAD (coronary artery disease)    a. LHC (10/07/12):  Ostial LM 60%, distal LM 40-50%, oLAD 40-50%, mLAD 70-80%, pCFX subtotally occluded, mCFX 80-90%, oRCA occluded, ostial L subclavian 40%, EF 25-30%, abdominal aorta diffusely diseased with severe ulcerated plaquing (right iliac ostial 95%; left renal long 60-70%, right okay). CO 2.1, CI 1.5 => s/p CABG (L-LAD,  S-OM, S-PDA) 10/2012 with Dr. Cyndia Bent   . Cancer ([place])    bladder  . Cataract    left eye  . Chronic systolic CHF (congestive heart failure) ([place]) 08/04/2012   Echo (08/17/12): EF 30%, diffuse HK, mild MR, mild BAE, PASP.;  Echo (01/2013): EF 40-45%, inferior posterior HK, Gr 2 DD, mild MR, mild LAE  . COPD (chronic obstructive pulmonary disease) ([place])   . 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 H/O hiatal hernia    s/p  75 yrs old  . Hyperkalemia    ACEI d/c'd  11/2012  . Hyperlipidemia   . Hypertension    controlled with medications  . Ischemic cardiomyopathy   . Lung cancer ([place])   . Lung nodule    chest CT 09/2012 => needs repeat by 03/2013  . PAD (peripheral artery disease) ([place])   . 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 Radiation 4/26,4/28,5/3,5/5,5/10   Left lung 5 fractions at 10 Gy  . Wears glasses     Past Surgical History:  Procedure Laterality Date  . ABDOMINAL HYSTERECTOMY  1981 (APPROX)   BILATERAL SALPINGO-OOPHORECTOMY WITH EXCEPTION A SMALL PIECE OF OVARY REMAINS  . APPENDECTOMY    . CARDIAC CATHETERIZATION    . CHOLECYSTECTOMY    . COLONOSCOPY    . CORONARY ARTERY BYPASS GRAFT N/A 11/14/2012   Procedure: CORONARY ARTERY BYPASS GRAFTING (CABG) times three using left internal mammary artery and left saphenous vein;  Surgeon: Gaye Pollack, MD; L-LAD, S-OM, S-PDA   . ENDARTERECTOMY Right 11/14/2012   Procedure: RIGHT ENDARTERECTOMY CAROTID with patch angioplasty.;  Surgeon: Rosetta Posner, MD;  Location: [place];  Service: Vascular;  Laterality: Right;  . ENDARTERECTOMY Left 11/08/2015   Procedure: ENDARTERECTOMY CAROTID WITH PATCH ANGIOPLASTY;  Surgeon: Rosetta Posner, MD;  Location: [place];  Service: Vascular;  Laterality: Left;  . HAMMER TOE SURGERY Left   . HIATAL HERNIA REPAIR  1964  (AGE 8)   CHOLECYSTECTOMY AND APPENDECTOMY  . INTRAOPERATIVE TRANSESOPHAGEAL ECHOCARDIOGRAM N/A 11/14/2012   Procedure: INTRAOPERATIVE TRANSESOPHAGEAL ECHOCARDIOGRAM;  Surgeon: Gaye Pollack, MD;  Location: [facility] OR;  Service: Open Heart Surgery;  Laterality: N/A;  . LEFT ELBOW SURGERY    . [place] SURGERY  1993  . TONSILLECTOMY  1959  . TONSILLECTOMY    . TRANSURETHRAL RESECTION OF BLADDER TUMOR N/A 06/24/2012   Procedure: TRANSURETHRAL RESECTION OF BLADDER TUMOR  WITH GYRUS AND INSTILLATION OF MITOMYCIN C  (TURBT);  Surgeon: Claybon Jabs, MD;  Location: [facility];  Service: Urology;   Laterality: N/A;  . WRIST SURGERY Left     Social History   Social History  . Marital status: Widowed    Spouse name: N/A  . Number of children: 2  . Years of education: 16   Occupational History  . RN, BSN [employer]   Social History Main Topics  . Smoking status: Former Smoker    Packs/day: 1.00    Years: 50.00    Types: Cigarettes    Quit date: 04/23/2013  . Smokeless tobacco: Never Used  . Alcohol use No     Comment: occasional wine not often  . Drug use: No  . Sexual activity: Not on file   Other Topics Concern  . Not on file   Social History Narrative  .  No narrative on file    Family History  Problem Relation Age of Onset  . Heart disease Mother     MI at age 109  . Cervical cancer Mother   . Cancer Mother   . Diabetes Mother   . Hypertension Mother   . Lung cancer Father 31    primary site lung CA then colon and bone  . Colon cancer Father   . Cancer Father   . Hypertension Father   . Cancer Other     colon cancer  . Hypertension Other   . Kidney disease Other   . Diabetes Other     ROS: no fevers or chills, productive cough, hemoptysis, dysphasia, odynophagia, melena, hematochezia, dysuria, hematuria, rash, seizure activity, orthopnea, PND, pedal edema, claudication. Remaining systems are negative.  Physical Exam: Well-developed well-nourished in no acute distress.  Skin is warm and dry.  HEENT is normal.  Neck is supple.  Chest diminished BS throughout Cardiovascular exam is regular rate and rhythm.  Abdominal exam nontender or distended. No masses palpated. Extremities show no edema. neuro grossly intact   A/P  1 Coronary artery disease-continue aspirin and statin.  2 carotid artery disease-continue aspirin and statin. Followed by vascular surgery.  3 ischemic cardiomyopathy-continue beta blocker and hydralazine/nitrates. LV function improved on most recent echocardiogram. No ACE inhibitor as this caused hyperkalemia  previously.  4 peripheral vascular disease-continue aspirin and statin.   5 hypertension-blood pressure controlled. Continue present medications.  6 chronic combined systolic/diastolic congestive heart failure-patient is euvolemic. Continue present dose of Lasix.      Kirk Ruths, MD

## 2016-02-19 ENCOUNTER — Ambulatory Visit
Admission: RE | Admit: 2016-02-19 | Discharge: 2016-02-19 | Disposition: A | Discharge status: Home / Self Care | Payer: PPO | Source: Ambulatory Visit | Attending: Radiation Oncology | Admitting: Radiation Oncology

## 2016-02-19 ENCOUNTER — Encounter: Payer: Self-pay | Admitting: Radiation Oncology

## 2016-02-19 VITALS — BP 174/63 | HR 65 | Temp 98.4°F | Ht 61.0 in | Wt 97.2 lb

## 2016-02-19 DIAGNOSIS — Z7984 Long term (current) use of oral hypoglycemic drugs: Secondary | ICD-10-CM | POA: Diagnosis not present

## 2016-02-19 DIAGNOSIS — C3431 Malignant neoplasm of lower lobe, right bronchus or lung: Secondary | ICD-10-CM | POA: Diagnosis not present

## 2016-02-19 DIAGNOSIS — E119 Type 2 diabetes mellitus without complications: Secondary | ICD-10-CM | POA: Insufficient documentation

## 2016-02-19 DIAGNOSIS — Z7982 Long term (current) use of aspirin: Secondary | ICD-10-CM | POA: Diagnosis not present

## 2016-02-19 DIAGNOSIS — F17211 Nicotine dependence, cigarettes, in remission: Secondary | ICD-10-CM | POA: Diagnosis not present

## 2016-02-19 DIAGNOSIS — Z79899 Other long term (current) drug therapy: Secondary | ICD-10-CM | POA: Diagnosis not present

## 2016-02-19 DIAGNOSIS — Z08 Encounter for follow-up examination after completed treatment for malignant neoplasm: Secondary | ICD-10-CM | POA: Diagnosis not present

## 2016-02-19 NOTE — Progress Notes (Signed)
Radiation Oncology         ([phone]) [phone] ________________________________  Name: Tonya Valdez MRN: [id number]  Date: 02/19/2016  DOB: 03-Jun-1940  Follow-Up Visit Note  Outpatient  CC: Cathlean Cower, MD  Biagio Borg, MD  Diagnosis and Prior Radiotherapy:    ICD-9-CM ICD-10-CM   1. Primary malignant neoplasm of right lower lobe of lung (HCC) 162.5 C34.31    Stage IA NSCLC right lower lung  06/19/2014, 06/21/2014, 06/26/2014, 06/28/2014, 07/03/2014: Right lung/ 50 Gy in 5 fractions at 10 Gy per fraction  Narrative:  The patient returns today for routine follow-up of radiation completed to her right lung on 07/03/14.  On review of systems, the patient denies pain. She denies cough or shortness of breath. The patient is eating well at the time; she is diabetic and reports watching her carbohydrates. She had a CT of her chest performed recently and is here for the results. 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 Weight is stable, she reports a fast metabolism  She has no new complaints.  Denies tobacco abuse.  Wt Readings from Last 3 Encounters:  02/19/16 97 lb 3.2 oz (44.1 kg)  11/19/15 96 lb 8 oz (43.8 kg)  11/08/15 104 lb 15 oz (47.6 kg)    ALLERGIES:  is allergic to ace inhibitors.  Meds: Current Outpatient Prescriptions  Medication Sig Dispense Refill  . aspirin EC 325 MG EC tablet Take 1 tablet (325 mg total) by mouth daily. 30 tablet 0  . Blood Glucose Monitoring Suppl (ONE TOUCH ULTRA 2) w/Device KIT Use to check blood sugars daily 1 each 0  . carvedilol (COREG) 12.5 MG tablet TAKE 1 AND 1/2 TABLETS BY MOUTH TWICE DAILY WITH A MEAL 270 tablet 1  . furosemide (LASIX) 20 MG tablet Take 1 tablet by mouth daily 90 tablet 1  . glucose blood (ONE TOUCH ULTRA TEST) test strip 1 each by Other route 4 (four) times daily. Use to check blood sugar four times a day Dx E11.9 400 each 3  . hydrALAZINE (APRESOLINE) 25 MG tablet Take 1 tablet (25 mg total) by mouth 3 (three) times daily.  270 tablet 1  . Hypromellose (ARTIFICIAL TEARS OP) Place 1 drop into both eyes 4 (four) times daily as needed (for dry eyes).    . isosorbide mononitrate (IMDUR) 30 MG 24 hr tablet Take 1 tablet (30 mg total) by mouth daily. 90 tablet 1  . Lancets (ONETOUCH ULTRASOFT) lancets 1 each by Other route 4 (four) times daily. Use to help check blood sugars four times a day Dx E11.9 400 each 3  . metFORMIN (GLUCOPHAGE) 500 MG tablet Take 1 tablet by mouth twice a day with a meal 180 tablet 1  . Multiple Vitamin (MULTIVITAMIN) tablet Take 1 tablet by mouth daily.    . Omega-3 Fatty Acids (FISH OIL) 1000 MG CAPS Take 1,000 mg by mouth daily.     . pravastatin (PRAVACHOL) 40 MG tablet Take 1 tablet (40 mg total) by mouth every evening. 90 tablet 1   No current facility-administered medications for this encounter.     Physical Findings: The patient is in no acute distress. Patient is alert and oriented.  height is 5' 1"  (1.549 m) and weight is 97 lb 3.2 oz (44.1 kg). Her temperature is 98.4 F (36.9 C). Her blood pressure is 174/63 (abnormal) and her pulse is 65. Her oxygen saturation is 95%.   General: Alert and oriented, in no acute distress. Cachectic -  stable with baseline HEENT: Oropharynx and oral cavity are clear. Dentures not removed Neck: Neck is supple, no palpable cervical or supraclavicular masses. Heart: Regular in rate and rhythm with a soft systolic murmur noted at the pulmonary region. Pt notes this is not new. Chest: Scattered expiratory wheezes with coarse breath sounds on inspiration in the right base. Lymphatics: see Neck Exam Skin: No concerning lesions. Some dry skin over the supraclavicular regions. Neurologic: No obvious focalities. Speech is fluent. Coordination is intact. Psychiatric: Judgment and insight are intact. Affect is appropriate.  Lab Findings: Lab Results  Component Value Date   WBC 8.1 11/09/2015   HGB 8.4 (L) 11/09/2015   HCT 26.2 (L) 11/09/2015   MCV 94.2  11/09/2015   PLT 157 11/09/2015    Radiographic Findings: I reviewed images. Ct Chest Wo Contrast  Result Date: 02/10/2016 CLINICAL DATA:  Right lower lobe lung cancer, prior SBRT. EXAM: CT CHEST WITHOUT CONTRAST TECHNIQUE: Multidetector CT imaging of the chest was performed following the standard protocol without IV contrast. COMPARISON:  10/02/2015 FINDINGS: Cardiovascular: Coronary, aortic arch, and branch vessel atherosclerotic vascular disease. Prior CABG. No cardiomegaly. Mediastinum/Nodes: Calcified lower paratracheal lymph nodes. Suspected type 1 hiatal hernia. Lungs/Pleura: Old granulomatous disease. The dominant right lower lobe lesion which was previously hypermetabolic 0037 demonstrates increasing airspace opacity just below the level of the lesion. It is difficult to measure this lesion because of the surrounding airspace opacity and pleural fluid along with the configuration of the lesion, but overall I suspect the lesion is essentially stable in size. There is some calcification of the visceral pleura. Peripheral interstitial accentuation with emphysema and some peripheral honeycombing noted. Previous 6 mm right lower lobe lesion currently measures 6 mm also on image 102/5. Lesion previously measuring 1.0 by 0.8 cm by my measurement on the prior exam currently measures the same on image 110/5. A small subpleural nodule anteriorly in the right lower lobe measures 4 mm in diameter, previously 3 mm, image 118/5. Other smaller areas of nodularity and scarring appear stable. Upper Abdomen: Vascular calcifications noted. Musculoskeletal: Prior median sternotomy. Right posterior eleventh rib fracture, healing. IMPRESSION: 1. There is some increase in the airspace opacity and pleural fluid surrounding the previously hypermetabolic right lower lobe triangular peripheral lesion. This makes it difficult to measure of the lesion. Overall the size seems similar although the airspace opacity below the level  of the lesion is increased. 2. Near the lesion there is a healing right eleventh rib fracture which is new compared the prior exam. 3. Severe emphysema with some scattered nodules in the right lung. Some of these are clearly calcified compatible with granulomas, and the other nodules are stable. 4. Emphysema with coarse peripheral interstitial accentuation favoring some early fibrosis. 5. Coronary, aortic arch, and branch vessel atherosclerotic vascular disease. Electronically Signed   By: Van Clines M.D.   On: 02/10/2016 16:26    Impression/Plan: NED on recent chest CT.   I would like the patient to have another Chest  CT scan in 4 months with subsequent follow up.   Advised to put vaseline on dry skin.  Applauded for tobacco cessation.  She will call me with any questions or concerns that may arise in the interim.  _____________________________________   Eppie Gibson, MD  This document serves as a record of services personally performed by Eppie Gibson, MD. It was created on her behalf by Maryla Morrow, a trained medical scribe. The creation of this record is based on the scribe's personal  observations and the provider's statements to them. This document has been checked and approved by the attending provider.

## 2016-02-19 NOTE — Progress Notes (Signed)
Tonya Valdez is here for follow up of radiation completed 07/03/14 to her Right Lung in 5 fractions. She denies pain or cough. She denies shortness of breath. She is eating well per her report, she is diabetic and watches her carbohydrates. She had a CT of her Chest performed recently and is here for the results. She has no other concerns at this time.   BP (!) 174/63   Pulse 65   Temp 98.4 F (36.9 C)   Ht '5\' 1"'$  (1.549 m)   Wt 97 lb 3.2 oz (44.1 kg)   SpO2 95% Comment: room air  BMI 18.37 kg/m    Wt Readings from Last 3 Encounters:  02/19/16 97 lb 3.2 oz (44.1 kg)  11/19/15 96 lb 8 oz (43.8 kg)  11/08/15 104 lb 15 oz (47.6 kg)

## 2016-02-20 ENCOUNTER — Other Ambulatory Visit: Payer: Self-pay | Admitting: Radiation Oncology

## 2016-02-20 DIAGNOSIS — C3431 Malignant neoplasm of lower lobe, right bronchus or lung: Secondary | ICD-10-CM

## 2016-02-27 ENCOUNTER — Ambulatory Visit (INDEPENDENT_AMBULATORY_CARE_PROVIDER_SITE_OTHER): Payer: PPO | Admitting: Cardiology

## 2016-02-27 ENCOUNTER — Encounter: Payer: Self-pay | Admitting: Cardiology

## 2016-02-27 VITALS — BP 120/50 | HR 60 | Ht 61.0 in | Wt 98.4 lb

## 2016-02-27 DIAGNOSIS — I6523 Occlusion and stenosis of bilateral carotid arteries: Secondary | ICD-10-CM

## 2016-02-27 DIAGNOSIS — I2510 Atherosclerotic heart disease of native coronary artery without angina pectoris: Secondary | ICD-10-CM | POA: Diagnosis not present

## 2016-02-27 DIAGNOSIS — I5022 Chronic systolic (congestive) heart failure: Secondary | ICD-10-CM

## 2016-02-27 DIAGNOSIS — I255 Ischemic cardiomyopathy: Secondary | ICD-10-CM

## 2016-02-27 DIAGNOSIS — I10 Essential (primary) hypertension: Secondary | ICD-10-CM | POA: Diagnosis not present

## 2016-02-27 NOTE — Patient Instructions (Signed)
Your physician recommends that you schedule a follow-up appointment in: 1 year  

## 2016-03-09 DIAGNOSIS — Z8551 Personal history of malignant neoplasm of bladder: Secondary | ICD-10-CM | POA: Diagnosis not present

## 2016-03-20 ENCOUNTER — Telehealth: Payer: Self-pay | Admitting: Internal Medicine

## 2016-03-20 NOTE — Telephone Encounter (Signed)
Attempted to call patient to schedule awv. Patient did not answer. Will attempt to call patient again at a later time.

## 2016-04-14 ENCOUNTER — Other Ambulatory Visit: Payer: Self-pay | Admitting: Cardiology

## 2016-04-14 MED ORDER — FUROSEMIDE 20 MG PO TABS: 20.0000 mg | ORAL_TABLET | Freq: Every day | ORAL | 3 refills | Status: DC

## 2016-04-14 MED ORDER — CARVEDILOL 12.5 MG PO TABS: ORAL_TABLET | ORAL | 3 refills | Status: DC

## 2016-04-14 MED ORDER — PRAVASTATIN SODIUM 40 MG PO TABS: 40.0000 mg | ORAL_TABLET | Freq: Every evening | ORAL | 3 refills | Status: DC

## 2016-04-14 MED ORDER — HYDRALAZINE HCL 25 MG PO TABS: 25.0000 mg | ORAL_TABLET | Freq: Three times a day (TID) | ORAL | 3 refills | Status: DC

## 2016-04-14 NOTE — Telephone Encounter (Signed)
Rx(s) sent to pharmacy electronically.  

## 2016-04-14 NOTE — Telephone Encounter (Signed)
New message     *STAT* If patient is at the pharmacy, call can be transferred to refill team.   1. Which medications need to be refilled? (please list name of each medication and dose if known) Furosemide '20mg'$ , Pravastatin '40mg'$ , Carvedilol 12.'5mg'$ , Hydralazine '25mg'$   2. Which pharmacy/location (including street and city if local pharmacy) is medication to be sent to? [place] [phone]  3. Do they need a 30 day or 90 day supply? 90 day  Pt calling stating her pharmacy states they have faxed over refill request for pt and haven't received anything back.

## 2016-04-15 ENCOUNTER — Other Ambulatory Visit: Payer: Self-pay | Admitting: *Deleted

## 2016-04-15 MED ORDER — CARVEDILOL 12.5 MG PO TABS: ORAL_TABLET | ORAL | 3 refills | Status: DC

## 2016-04-15 MED ORDER — HYDRALAZINE HCL 25 MG PO TABS: 25.0000 mg | ORAL_TABLET | Freq: Three times a day (TID) | ORAL | 3 refills | Status: DC

## 2016-04-15 MED ORDER — ISOSORBIDE MONONITRATE ER 30 MG PO TB24: 30.0000 mg | ORAL_TABLET | Freq: Every day | ORAL | 3 refills | Status: DC

## 2016-04-15 MED ORDER — PRAVASTATIN SODIUM 40 MG PO TABS: 40.0000 mg | ORAL_TABLET | Freq: Every evening | ORAL | 3 refills | Status: DC

## 2016-05-06 ENCOUNTER — Ambulatory Visit (INDEPENDENT_AMBULATORY_CARE_PROVIDER_SITE_OTHER): Payer: PPO | Admitting: Internal Medicine

## 2016-05-06 ENCOUNTER — Other Ambulatory Visit (INDEPENDENT_AMBULATORY_CARE_PROVIDER_SITE_OTHER): Payer: PPO

## 2016-05-06 ENCOUNTER — Encounter: Payer: Self-pay | Admitting: Internal Medicine

## 2016-05-06 VITALS — BP 138/86 | HR 86 | Temp 97.9°F | Ht 61.0 in | Wt 99.0 lb

## 2016-05-06 DIAGNOSIS — Z0000 Encounter for general adult medical examination without abnormal findings: Secondary | ICD-10-CM

## 2016-05-06 DIAGNOSIS — Z23 Encounter for immunization: Secondary | ICD-10-CM | POA: Diagnosis not present

## 2016-05-06 DIAGNOSIS — E119 Type 2 diabetes mellitus without complications: Secondary | ICD-10-CM | POA: Diagnosis not present

## 2016-05-06 DIAGNOSIS — J438 Other emphysema: Secondary | ICD-10-CM

## 2016-05-06 DIAGNOSIS — E2839 Other primary ovarian failure: Secondary | ICD-10-CM | POA: Diagnosis not present

## 2016-05-06 LAB — BASIC METABOLIC PANEL
BUN: 16 mg/dL (ref 6–23)
CHLORIDE: 104 meq/L (ref 96–112)
CO2: 29 mEq/L (ref 19–32)
CREATININE: 0.8 mg/dL (ref 0.40–1.20)
Calcium: 9.5 mg/dL (ref 8.4–10.5)
GFR: 74 mL/min (ref >60.00)
Glucose, Bld: 124 mg/dL — ABNORMAL HIGH (ref 70–99)
POTASSIUM: 3.6 meq/L (ref 3.5–5.1)
Sodium: 140 mEq/L (ref 135–145)

## 2016-05-06 LAB — URINALYSIS, ROUTINE W REFLEX MICROSCOPIC
Bilirubin Urine: NEGATIVE
Hgb urine dipstick: NEGATIVE
KETONES UR: NEGATIVE
Leukocytes, UA: NEGATIVE
NITRITE: NEGATIVE
PH: 6 (ref 5.0–8.0)
RBC / HPF: NONE SEEN (ref 0–?)
SPECIFIC GRAVITY, URINE: 1.01 (ref 1.000–1.030)
Total Protein, Urine: 30 — AB
URINE GLUCOSE: NEGATIVE
Urobilinogen, UA: 0.2 (ref 0.0–1.0)
WBC UA: NONE SEEN (ref 0–?)

## 2016-05-06 LAB — CBC WITH DIFFERENTIAL/PLATELET
BASOS PCT: 0.7 % (ref 0.0–3.0)
Basophils Absolute: 0.1 10*3/uL (ref 0.0–0.1)
EOS PCT: 3.6 % (ref 0.0–5.0)
Eosinophils Absolute: 0.4 10*3/uL (ref 0.0–0.7)
HEMATOCRIT: 34.3 % — AB (ref 36.0–46.0)
HEMOGLOBIN: 11.4 g/dL — AB (ref 12.0–15.0)
LYMPHS PCT: 22.3 % (ref 12.0–46.0)
Lymphs Abs: 2.6 10*3/uL (ref 0.7–4.0)
MCHC: 33.4 g/dL (ref 30.0–36.0)
MCV: 91.2 fl (ref 78.0–100.0)
Monocytes Absolute: 1.1 10*3/uL — ABNORMAL HIGH (ref 0.1–1.0)
Monocytes Relative: 9.2 % (ref 3.0–12.0)
NEUTROS ABS: 7.4 10*3/uL (ref 1.4–7.7)
Neutrophils Relative %: 64.2 % (ref 43.0–77.0)
PLATELETS: 247 10*3/uL (ref 150.0–400.0)
RBC: 3.76 Mil/uL — ABNORMAL LOW (ref 3.87–5.11)
RDW: 15.3 % (ref 11.5–15.5)
WBC: 11.6 10*3/uL — AB (ref 4.0–10.5)

## 2016-05-06 LAB — LIPID PANEL
CHOL/HDL RATIO: 2
Cholesterol: 123 mg/dL (ref 0–200)
HDL: 52.9 mg/dL (ref >39.00)
LDL CALC: 48 mg/dL (ref 0–99)
NONHDL: 69.86
TRIGLYCERIDES: 109 mg/dL (ref 0.0–149.0)
VLDL: 21.8 mg/dL (ref 0.0–40.0)

## 2016-05-06 LAB — MICROALBUMIN / CREATININE URINE RATIO
CREATININE, U: 24.7 mg/dL
MICROALB/CREAT RATIO: 145.3 mg/g — AB (ref 0.0–30.0)
Microalb, Ur: 35.9 mg/dL — ABNORMAL HIGH (ref 0.0–1.9)

## 2016-05-06 LAB — HEPATIC FUNCTION PANEL
ALT: 10 U/L (ref 0–35)
AST: 21 U/L (ref 0–37)
Albumin: 3.5 g/dL (ref 3.5–5.2)
Alkaline Phosphatase: 71 U/L (ref 39–117)
BILIRUBIN DIRECT: 0.1 mg/dL (ref 0.0–0.3)
BILIRUBIN TOTAL: 0.3 mg/dL (ref 0.2–1.2)
Total Protein: 6.7 g/dL (ref 6.0–8.3)

## 2016-05-06 LAB — TSH: TSH: 5.8 u[IU]/mL — ABNORMAL HIGH (ref 0.35–4.50)

## 2016-05-06 LAB — HEMOGLOBIN A1C: Hgb A1c MFr Bld: 6.8 % — ABNORMAL HIGH (ref 4.6–6.5)

## 2016-05-06 NOTE — Patient Instructions (Addendum)
You had the Tdap (tetanus) shot today  Please schedule the bone density test before leaving today at the scheduling desk (where you check out)  Your parking application form was signed today  Please continue all other medications as before, and refills have been done if requested.  Please have the pharmacy call with any other refills you may need.  Please continue your efforts at being more active, low cholesterol diet, and weight control.  You are otherwise up to date with prevention measures today.  Please keep your appointments with your specialists as you may have planned  Please go to the LAB in the Basement (turn left off the elevator) for the tests to be done today  You will be contacted by phone if any changes need to be made immediately.  Otherwise, you will receive a letter about your results with an explanation, but please check with MyChart first.  Please remember to sign up for MyChart if you have not done so, as this will be important to you in the future with finding out test results, communicating by private email, and scheduling acute appointments online when needed.  Please return in 6 months, or sooner if needed, with Lab testing done 3-5 days before

## 2016-05-06 NOTE — Progress Notes (Signed)
Subjective:    Patient ID: Tonya Valdez, female    DOB: 1940-08-18, 76 y.o.   MRN: [id number]  HPI  Here for wellness and f/u;  Overall doing ok;  Pt denies Chest pain, worsening SOB, DOE, wheezing, orthopnea, PND, worsening LE edema, palpitations, dizziness or syncope.  Pt denies neurological change such as new headache, facial or extremity weakness.  Pt denies polydipsia, polyuria, or low sugar symptoms. Pt states overall good compliance with treatment and medications, good tolerability, and has been trying to follow appropriate diet.  Pt denies worsening depressive symptoms, suicidal ideation or panic. No fever, night sweats, wt loss, loss of appetite, or other constitutional symptoms.  Pt states good ability with ADL's, has low fall risk, home safety reviewed and adequate, no other significant changes in hearing or vision, and not with exercise  Hard to gain wt.  Wt Readings from Last 3 Encounters:  05/06/16 99 lb (44.9 kg)  02/27/16 98 lb 6.4 oz (44.6 kg)  02/19/16 97 lb 3.2 oz (44.1 kg)   BP Readings from Last 3 Encounters:  05/06/16 138/86  02/27/16 (!) 120/50  02/19/16 (!) 174/63  Plans to f/u with Dr Delman Cheadle for right cataract by sept 2018 which is the yearly f/u date for DM eye exam.  Sees urology with hx of bladder ca, now down to every 6 mo.  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 Bilateral carotid artery disease ([place]) 09/26/2012   carotid US (8/14):  R 80-99; L 60-79 => s/p R CEA 10/2012 (Dr. Donnetta Hutching)  . Bladder cancer ([place]) 04/26/2013  . Bladder tumor   . CAD (coronary artery disease)    a. LHC (10/07/12):  Ostial LM 60%, distal LM 40-50%, oLAD 40-50%, mLAD 70-80%, pCFX subtotally occluded, mCFX 80-90%, oRCA occluded, ostial L subclavian 40%, EF 25-30%, abdominal aorta diffusely diseased with severe ulcerated plaquing (right iliac ostial 95%; left renal long 60-70%, right okay). CO 2.1, CI 1.5 => s/p  CABG (L-LAD, S-OM, S-PDA) 10/2012 with Dr. Cyndia Bent   . Cancer ([place])    bladder  . Cataract    left eye  . Chronic systolic CHF (congestive heart failure) ([place]) 08/04/2012   Echo (08/17/12): EF 30%, diffuse HK, mild MR, mild BAE, PASP.;  Echo (01/2013): EF 40-45%, inferior posterior HK, Gr 2 DD, mild MR, mild LAE  . COPD (chronic obstructive pulmonary disease) ([place])   . 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 H/O hiatal hernia    s/p  76 yrs old  . Hyperkalemia    ACEI d/c'd 11/2012  . Hyperlipidemia   . Hypertension    controlled with medications  . Ischemic cardiomyopathy   . Lung cancer ([place])   . Lung nodule    chest CT 09/2012 => needs repeat by 03/2013  . PAD (peripheral artery disease) ([place])   . 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 Radiation 4/26,4/28,5/3,5/5,5/10   Left lung 5 fractions at 10 Gy  . Wears glasses    Past Surgical History:  Procedure Laterality Date  . ABDOMINAL HYSTERECTOMY  1981 (APPROX)   BILATERAL SALPINGO-OOPHORECTOMY WITH EXCEPTION A SMALL PIECE OF OVARY REMAINS  . APPENDECTOMY    . CARDIAC CATHETERIZATION    . CHOLECYSTECTOMY    .  COLONOSCOPY    . CORONARY ARTERY BYPASS GRAFT N/A 11/14/2012   Procedure: CORONARY ARTERY BYPASS GRAFTING (CABG) times three using left internal mammary artery and left saphenous vein;  Surgeon: Gaye Pollack, MD; L-LAD, S-OM, S-PDA   . ENDARTERECTOMY Right 11/14/2012   Procedure: RIGHT ENDARTERECTOMY CAROTID with patch angioplasty.;  Surgeon: Rosetta Posner, MD;  Location: [facility];  Service: Vascular;  Laterality: Right;  . ENDARTERECTOMY Left 11/08/2015   Procedure: ENDARTERECTOMY CAROTID WITH PATCH ANGIOPLASTY;  Surgeon: Rosetta Posner, MD;  Location: [facility];  Service: Vascular;  Laterality: Left;  . HAMMER TOE SURGERY Left   . HIATAL HERNIA REPAIR  1964  (AGE 34)   CHOLECYSTECTOMY AND  APPENDECTOMY  . INTRAOPERATIVE TRANSESOPHAGEAL ECHOCARDIOGRAM N/A 11/14/2012   Procedure: INTRAOPERATIVE TRANSESOPHAGEAL ECHOCARDIOGRAM;  Surgeon: Gaye Pollack, MD;  Location: [facility] OR;  Service: Open Heart Surgery;  Laterality: N/A;  . LEFT ELBOW SURGERY    . [place] SURGERY  1993  . TONSILLECTOMY  1959  . TONSILLECTOMY    . TRANSURETHRAL RESECTION OF BLADDER TUMOR N/A 06/24/2012   Procedure: TRANSURETHRAL RESECTION OF BLADDER TUMOR  WITH GYRUS AND INSTILLATION OF MITOMYCIN C  (TURBT);  Surgeon: Claybon Jabs, MD;  Location: [facility];  Service: Urology;  Laterality: N/A;  . WRIST SURGERY Left     reports that she quit smoking about 3 years ago. Her smoking use included Cigarettes. She has a 50.00 pack-year smoking history. She has never used smokeless tobacco. She reports that she does not drink alcohol or use drugs. family history includes Cancer in her father, mother, and other; Cervical cancer in her mother; Colon cancer in her father; Diabetes in her mother and other; Heart disease in her mother; Hypertension in her father, mother, and other; Kidney disease in her other; Lung cancer (age of onset: 21) in her father. Allergies  Allergen Reactions  . Ace Inhibitors Other (See Comments)    Hyperkalemia; pt denies    Current Outpatient Prescriptions on File Prior to Visit  Medication Sig Dispense Refill  . aspirin EC 325 MG EC tablet Take 1 tablet (325 mg total) by mouth daily. 30 tablet 0  . Blood Glucose Monitoring Suppl (ONE TOUCH ULTRA 2) w/Device KIT Use to check blood sugars daily 1 each 0  . carvedilol (COREG) 12.5 MG tablet TAKE 1 AND 1/2 TABLETS BY MOUTH TWICE DAILY WITH A MEAL 270 tablet 3  . furosemide (LASIX) 20 MG tablet Take 1 tablet (20 mg total) by mouth daily. 90 tablet 3  . glucose blood (ONE TOUCH ULTRA TEST) test strip 1 each by Other route 4 (four) times daily. Use to check blood sugar four times a day Dx E11.9 400 each 3  . hydrALAZINE (APRESOLINE)  25 MG tablet Take 1 tablet (25 mg total) by mouth 3 (three) times daily. 270 tablet 3  . Hypromellose (ARTIFICIAL TEARS OP) Place 1 drop into both eyes 4 (four) times daily as needed (for dry eyes).    . isosorbide mononitrate (IMDUR) 30 MG 24 hr tablet Take 1 tablet (30 mg total) by mouth daily. 90 tablet 3  . Lancets (ONETOUCH ULTRASOFT) lancets 1 each by Other route 4 (four) times daily. Use to help check blood sugars four times a day Dx E11.9 400 each 3  . metFORMIN (GLUCOPHAGE) 500 MG tablet Take 1 tablet by mouth twice a day with a meal 180 tablet 1  . Multiple Vitamin (MULTIVITAMIN) tablet Take 1 tablet by mouth daily.    [name]  Omega-3 Fatty Acids (FISH OIL) 1000 MG CAPS Take 1,000 mg by mouth daily.     . pravastatin (PRAVACHOL) 40 MG tablet Take 1 tablet (40 mg total) by mouth every evening. 90 tablet 3   No current facility-administered medications on file prior to visit.    Review of Systems Constitutional: Negative for increased diaphoresis, or other activity, appetite or siginficant weight change other than noted HENT: Negative for worsening hearing loss, ear pain, facial swelling, mouth sores and neck stiffness.   Eyes: Negative for other worsening pain, redness or visual disturbance.  Respiratory: Negative for choking or stridor Cardiovascular: Negative for other chest pain and palpitations.  Gastrointestinal: Negative for worsening diarrhea, blood in stool, or abdominal distention Genitourinary: Negative for hematuria, flank pain or change in urine volume.  Musculoskeletal: Negative for myalgias or other joint complaints.  Skin: Negative for other color change and wound or drainage.  Neurological: Negative for syncope and numbness. other than noted Hematological: Negative for adenopathy. or other swelling Psychiatric/Behavioral: Negative for hallucinations, SI, self-injury, decreased concentration or other worsening agitation.  All other system neg per pt    Objective:    Physical Exam BP 138/86   Pulse 86   Temp 97.9 F (36.6 C)   Ht _0  (1.549 m)   Wt 99 lb (44.9 kg)   SpO2 98%   BMI 18.71 kg/m  VS noted, thin for height Constitutional: Pt is oriented to person, place, and time. Appears well-developed and well-nourished, in no significant distress Head: Normocephalic and atraumatic  Eyes: Conjunctivae and EOM are normal. Pupils are equal, round, and reactive to light Right Ear: External ear normal.  Left Ear: External ear normal Nose: Nose normal.  Mouth/Throat: Oropharynx is clear and moist  Neck: Normal range of motion. Neck supple. No JVD present. No tracheal deviation present or significant neck LA or mass Cardiovascular: Normal rate, regular rhythm, normal heart sounds decreased and intact distal pulses.   Pulmonary/Chest: Effort normal and breath sounds without rales or wheezing  Abdominal: Soft. Bowel sounds are normal. NT. No HSM  Musculoskeletal: Normal range of motion. Exhibits no edema Lymphadenopathy: Has no cervical adenopathy.  Neurological: Pt is alert and oriented to person, place, and time. Pt has normal reflexes. No cranial nerve deficit. Motor grossly intact Skin: Skin is warm and dry. No rash noted or new ulcers Psychiatric:  Has normal mood and affect. Behavior is normal.  No other exam findings       Assessment & Plan:

## 2016-05-10 NOTE — Assessment & Plan Note (Signed)

## 2016-05-10 NOTE — Assessment & Plan Note (Signed)
stable overall by history and exam, recent data reviewed with pt, and pt to continue medical treatment as before,  to f/u any worsening symptoms or concerns Lab Results  Component Value Date   HGBA1C 6.8 (H) 05/06/2016

## 2016-05-10 NOTE — Assessment & Plan Note (Signed)
stable overall by history and exam, and pt to continue medical treatment as before,  to f/u any worsening symptoms or concerns 

## 2016-05-18 ENCOUNTER — Ambulatory Visit (INDEPENDENT_AMBULATORY_CARE_PROVIDER_SITE_OTHER)
Admission: RE | Admit: 2016-05-18 | Discharge: 2016-05-18 | Disposition: A | Discharge status: Home / Self Care | Payer: PPO | Source: Ambulatory Visit | Attending: Internal Medicine | Admitting: Internal Medicine

## 2016-05-18 DIAGNOSIS — E2839 Other primary ovarian failure: Secondary | ICD-10-CM | POA: Diagnosis not present

## 2016-05-20 ENCOUNTER — Encounter: Payer: Self-pay | Admitting: Vascular Surgery

## 2016-05-21 ENCOUNTER — Telehealth: Payer: Self-pay

## 2016-05-21 NOTE — Telephone Encounter (Signed)
-----   Message from Biagio Borg, MD sent at 05/20/2016 12:59 PM EDT ----- Left message on MyChart, pt to cont same tx except  The test results show that your current treatment is OK, except the T score indicates severe osteoporosis.  We should try treatment with Prolia injections, and I will forward this to out office head nurse, Jonelle Sidle, to help with this.     Shirron to please inform pt, I will ask Jonelle Sidle to further address copay and admin for this pt, thanks  Jonelle Sidle - can we look in to copay for the prolia and start for this patient?

## 2016-05-21 NOTE — Telephone Encounter (Signed)
Called pt, LVM for pt to call back for results.

## 2016-05-21 NOTE — Telephone Encounter (Signed)
prolia information has been submitted into patient's insurance----Tonya Valdez will call patient back with copay/coverage findings

## 2016-06-02 ENCOUNTER — Encounter (HOSPITAL_COMMUNITY): Payer: PPO

## 2016-06-02 ENCOUNTER — Ambulatory Visit: Payer: PPO | Admitting: Vascular Surgery

## 2016-06-17 ENCOUNTER — Telehealth: Payer: Self-pay | Admitting: *Deleted

## 2016-06-17 NOTE — Telephone Encounter (Signed)
Called patient to inform of CT for 06-18-16- arrival time - 4:45 pm @ [facility], no restrictions to test and her fu visit with Dr. Isidore Moos on 06-19-16 @ 11:20 am, spoke with patient and she is aware of these appts.

## 2016-06-17 NOTE — Progress Notes (Signed)
Radiation Oncology         [phone] ________________________________  Name: Tonya Valdez MRN: [id number]  Date: 06/19/2016  DOB: Dec 17, 1940  Follow-Up Visit Note  Outpatient  CC: Cathlean Cower, MD  Biagio Borg, MD  Diagnosis and Prior Radiotherapy:    ICD-9-CM ICD-10-CM   1. Primary malignant neoplasm of right lower lobe of lung ([place]) 162.5 C34.31    Stage IA NSCLC right lower lung  06/19/2014, 06/21/2014, 06/26/2014, 06/28/2014, 07/03/2014: Right lung treated to 50 Gy in 5 fractions at 10 Gy per fraction  Chief Complaint: Follow up of lung cancer  Narrative:  The patient returns today for routine follow-up of radiation completed to her right lung on 07/03/14.  The patient underwent CT chest on 06/18/16 which was stable with no new or progressive findings. I have personally reviewed these scans.  On review of systems, the patient denies pain. She reports allergies, which have caused her to cough recently. She denies shortness of breath. Some cold intolerance. Cont'd difficulty gaining weight. Overall the patient is without complaint.    ALLERGIES:  is allergic to ace inhibitors.  Meds: Current Outpatient Prescriptions  Medication Sig Dispense Refill  . aspirin EC 325 MG EC tablet Take 1 tablet (325 mg total) by mouth daily. 30 tablet 0  . Blood Glucose Monitoring Suppl (ONE TOUCH ULTRA 2) w/Device KIT Use to check blood sugars daily 1 each 0  . carvedilol (COREG) 12.5 MG tablet TAKE 1 AND 1/2 TABLETS BY MOUTH TWICE DAILY WITH A MEAL 270 tablet 3  . furosemide (LASIX) 20 MG tablet Take 1 tablet (20 mg total) by mouth daily. 90 tablet 3  . glucose blood (ONE TOUCH ULTRA TEST) test strip 1 each by Other route 4 (four) times daily. Use to check blood sugar four times a day Dx E11.9 400 each 3  . hydrALAZINE (APRESOLINE) 25 MG tablet Take 1 tablet (25 mg total) by mouth 3 (three) times daily. 270 tablet 3  . Hypromellose (ARTIFICIAL TEARS OP) Place 1 drop into both eyes 4 (four)  times daily as needed (for dry eyes).    . isosorbide mononitrate (IMDUR) 30 MG 24 hr tablet Take 1 tablet (30 mg total) by mouth daily. 90 tablet 3  . Lancets (ONETOUCH ULTRASOFT) lancets 1 each by Other route 4 (four) times daily. Use to help check blood sugars four times a day Dx E11.9 400 each 3  . metFORMIN (GLUCOPHAGE) 500 MG tablet Take 1 tablet by mouth twice a day with a meal 180 tablet 1  . Multiple Vitamin (MULTIVITAMIN) tablet Take 1 tablet by mouth daily.    . Omega-3 Fatty Acids (FISH OIL) 1000 MG CAPS Take 1,000 mg by mouth daily.     . pravastatin (PRAVACHOL) 40 MG tablet Take 1 tablet (40 mg total) by mouth every evening. 90 tablet 3   No current facility-administered medications for this encounter.     Physical Findings: The patient is in no acute distress. Patient is alert and oriented.  height is _0  (1.549 m) and weight is 97 lb (44 kg). Her temperature is 98 F (36.7 C). Her blood pressure is 180/58 (abnormal) and her pulse is 70. Her oxygen saturation is 100%.   General: Alert and oriented, in no acute distress.  Cachectic - stable with baseline Neck: Prominent submandibular glands.  No palpable masses in neck. Heart: Regular in rate and rhythm with a soft systolic murmur heard throughout the upper chest. Pt notes this is  not new. Chest: Lungs are clear to auscultation bilaterally. Lymphatics: see Neck Exam Abdomen: soft, non tender Skin: No concerning lesions. Neurologic: No obvious focalities. Speech is fluent. Coordination is intact. Psychiatric: Judgment and insight are intact. Affect is appropriate.  Lab Findings: Lab Results  Component Value Date   WBC 11.6 (H) 05/06/2016   HGB 11.4 (L) 05/06/2016   HCT 34.3 (L) 05/06/2016   MCV 91.2 05/06/2016   PLT 247.0 05/06/2016    Radiographic Findings: I reviewed images. Ct Chest Wo Contrast  Result Date: 06/19/2016 CLINICAL DATA:  Right lower lobe lung cancer. EXAM: CT CHEST WITHOUT CONTRAST TECHNIQUE:  Multidetector CT imaging of the chest was performed following the standard protocol without IV contrast. COMPARISON:  02/10/2016 FINDINGS: Cardiovascular: The heart size is normal. No pericardial effusion. Coronary artery calcification is noted. Atherosclerotic calcification is noted in the wall of the thoracic aorta. Mediastinum/Nodes: Calcified nodal tissue again noted in the mediastinum. No mediastinal lymphadenopathy. No evidence for gross hilar lymphadenopathy although assessment is limited by the lack of intravenous contrast on today's study. There is no axillary lymphadenopathy. Lungs/Pleura: Emphysema noted with biapical pleuroparenchymal scarring as before. Scattered calcified granulomata. 7 x 10 mm noncalcified right lower lobe nodule (image 108 series 7) is stable in the interval. 4 mm perifissural nodule right lung base unchanged. Consolidative changes in the posterior right costophrenic sulcus is similar to prior and represents the area of previous lung lesion. No substantial interval change in appearance. Upper Abdomen: Stable. Musculoskeletal: Bone windows reveal no worrisome lytic or sclerotic osseous lesions. Stable posterior right eleventh rib fracture. IMPRESSION: 1. Stable exam.  No new or progressive findings. 2. The airspace consolidations seen posterior right lower lobe in the costophrenic sulcus is stable and incorporates the lesion evident on 10/02/2015. Overall imaging features are stable since 02/10/2016. 3. Stable 10 mm anterior right lower lobe pulmonary nodule. 4. Other scattered tiny calcified and noncalcified pulmonary nodules unchanged. 5. Emphysema. 6. Coronary artery atherosclerosis in this patient status post CABG. Electronically Signed   By: Misty Stanley M.D.   On: 06/19/2016 10:56    Impression/Plan: NED on recent chest CT.   Cold intolerance: The patient will follow up with her PCP to discuss her thyroid function and potential need for thyroid supplement. She declines  labs today at the [facility]. I recommended Free T4 given recent TSH at PCP.  Lab Results  Component Value Date   TSH 5.80 (H) 05/06/2016    Follow up CT scan in 6 months with follow up.  She will call me with any questions or concerns that may arise in the interim.  _____________________________________   Eppie Gibson, MD  This document serves as a record of services personally performed by Eppie Gibson, MD. It was created on her behalf by Maryla Morrow, a trained medical scribe. The creation of this record is based on the scribe's personal observations and the provider's statements to them. This document has been checked and approved by the attending provider.

## 2016-06-18 ENCOUNTER — Ambulatory Visit (HOSPITAL_COMMUNITY)
Admission: RE | Admit: 2016-06-18 | Discharge: 2016-06-18 | Disposition: A | Discharge status: Home / Self Care | Payer: PPO | Source: Ambulatory Visit | Attending: Radiation Oncology | Admitting: Radiation Oncology

## 2016-06-18 DIAGNOSIS — Z951 Presence of aortocoronary bypass graft: Secondary | ICD-10-CM | POA: Insufficient documentation

## 2016-06-18 DIAGNOSIS — C3431 Malignant neoplasm of lower lobe, right bronchus or lung: Secondary | ICD-10-CM | POA: Diagnosis not present

## 2016-06-18 DIAGNOSIS — J439 Emphysema, unspecified: Secondary | ICD-10-CM | POA: Insufficient documentation

## 2016-06-18 DIAGNOSIS — I2510 Atherosclerotic heart disease of native coronary artery without angina pectoris: Secondary | ICD-10-CM | POA: Insufficient documentation

## 2016-06-18 DIAGNOSIS — R918 Other nonspecific abnormal finding of lung field: Secondary | ICD-10-CM | POA: Diagnosis not present

## 2016-06-19 ENCOUNTER — Ambulatory Visit
Admission: RE | Admit: 2016-06-19 | Discharge: 2016-06-19 | Disposition: A | Discharge status: Home / Self Care | Payer: PPO | Source: Ambulatory Visit | Attending: Radiation Oncology | Admitting: Radiation Oncology

## 2016-06-19 ENCOUNTER — Encounter: Payer: Self-pay | Admitting: Radiation Oncology

## 2016-06-19 VITALS — BP 180/58 | HR 70 | Temp 98.0°F | Ht 61.0 in | Wt 97.0 lb

## 2016-06-19 DIAGNOSIS — R911 Solitary pulmonary nodule: Secondary | ICD-10-CM | POA: Diagnosis not present

## 2016-06-19 DIAGNOSIS — I2510 Atherosclerotic heart disease of native coronary artery without angina pectoris: Secondary | ICD-10-CM | POA: Insufficient documentation

## 2016-06-19 DIAGNOSIS — C3431 Malignant neoplasm of lower lobe, right bronchus or lung: Secondary | ICD-10-CM | POA: Diagnosis not present

## 2016-06-19 DIAGNOSIS — I2584 Coronary atherosclerosis due to calcified coronary lesion: Secondary | ICD-10-CM | POA: Diagnosis not present

## 2016-06-19 DIAGNOSIS — Z951 Presence of aortocoronary bypass graft: Secondary | ICD-10-CM | POA: Diagnosis not present

## 2016-06-19 DIAGNOSIS — Z08 Encounter for follow-up examination after completed treatment for malignant neoplasm: Secondary | ICD-10-CM | POA: Diagnosis not present

## 2016-06-19 DIAGNOSIS — R6889 Other general symptoms and signs: Secondary | ICD-10-CM | POA: Diagnosis not present

## 2016-06-19 DIAGNOSIS — Z7982 Long term (current) use of aspirin: Secondary | ICD-10-CM | POA: Diagnosis not present

## 2016-06-19 DIAGNOSIS — Z923 Personal history of irradiation: Secondary | ICD-10-CM | POA: Diagnosis not present

## 2016-06-19 DIAGNOSIS — Z85118 Personal history of other malignant neoplasm of bronchus and lung: Secondary | ICD-10-CM | POA: Insufficient documentation

## 2016-06-19 NOTE — Addendum Note (Signed)
Encounter addended by: Ernst Spell, RN on: 06/19/2016  1:06 PM<BR>    Actions taken: Charge Capture section accepted

## 2016-06-19 NOTE — Progress Notes (Signed)
Tonya Valdez presents for follow up of radiation completed 07/03/14 to her Right Lower Lung. She denies pain. She report allergies, which have caused a cough recently. She denies shortness of breath. She had a CT Chest 06/18/16 and is here to get the results. She has no new complaints at this time, and is actually traveling to [facility] tomorrow.   BP (!) 180/58   Pulse 70   Temp 98 F (36.7 C)   Ht '5\' 1"'$  (1.549 m)   Wt 97 lb (44 kg)   SpO2 100% Comment: room air  BMI 18.33 kg/m    Wt Readings from Last 3 Encounters:  06/19/16 97 lb (44 kg)  05/06/16 99 lb (44.9 kg)  02/27/16 98 lb 6.4 oz (44.6 kg)

## 2016-06-23 ENCOUNTER — Telehealth: Payer: Self-pay

## 2016-06-23 NOTE — Telephone Encounter (Signed)
Left message advising patient call tamara back to discuss prolia----insurance has been verified and there is estimated $230 copay for prolia injections---can talk with tamara if any questions---patient can schedule at earliest convenience, this will be her first injection

## 2016-07-01 ENCOUNTER — Encounter: Payer: Self-pay | Admitting: Vascular Surgery

## 2016-07-05 IMAGING — CT CT CHEST W/O CM
2 of 4 series · 15 of 36 positions shown, 18 images · non-contrast
Comparison: 05/08/2015

CLINICAL DATA: Followup right lower lobe squamous cell carcinoma
tree with radiation therapy 7789. Restaging. Followup indeterminate
pulmonary nodules. Personal history of bladder carcinoma.

EXAM:
CT CHEST WITHOUT CONTRAST
TECHNIQUE: Multidetector CT imaging of the chest was performed following the
standard protocol without IV contrast.

[Series 2: rtn chest without st · axial · non-contrast · 0.65mm/px · z∈[-157,+113]mm · 12 of 106 slices shown, 15 images]
[im 8/106  mediastinal]
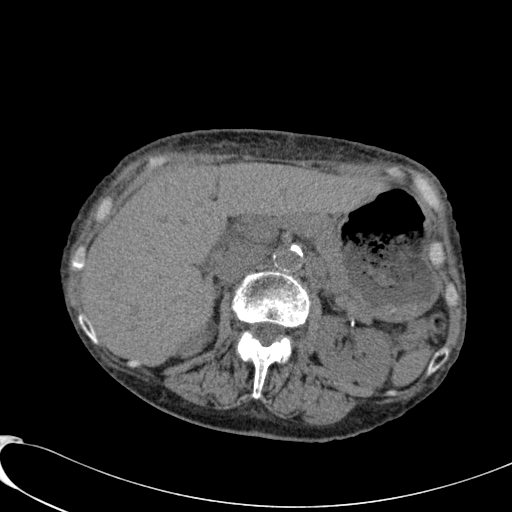
[im 8/106  lung]
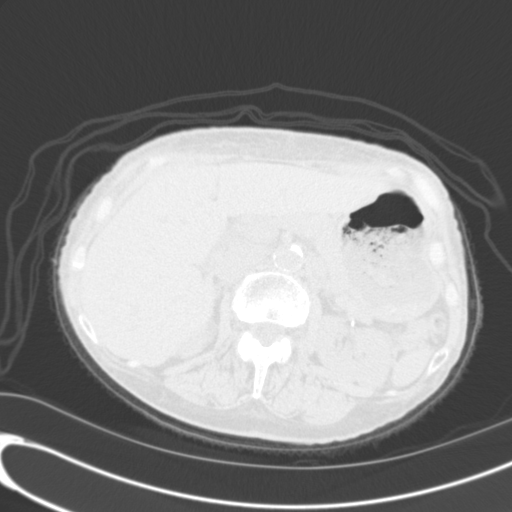
[im 16/106  lung]
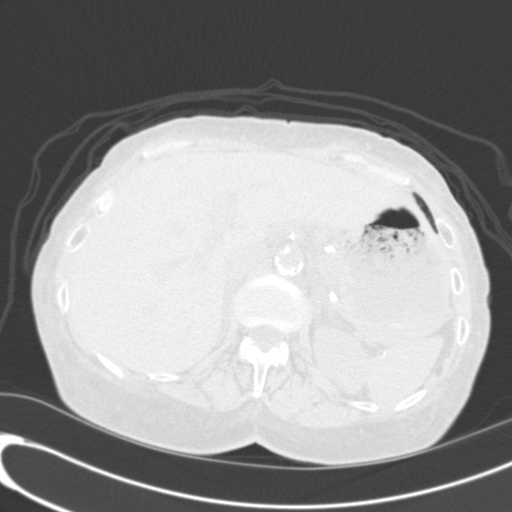
[im 23/106  lung]
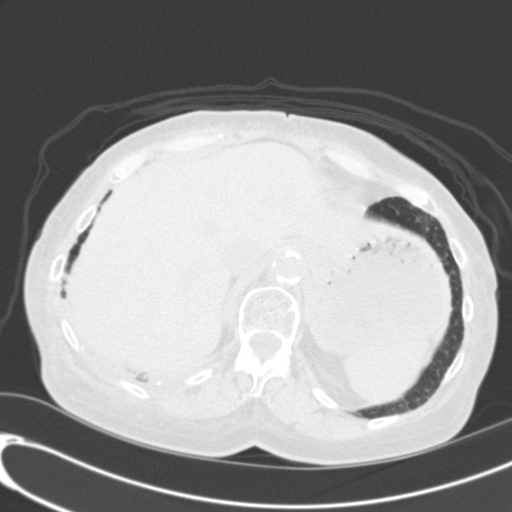
[im 31/106  lung]
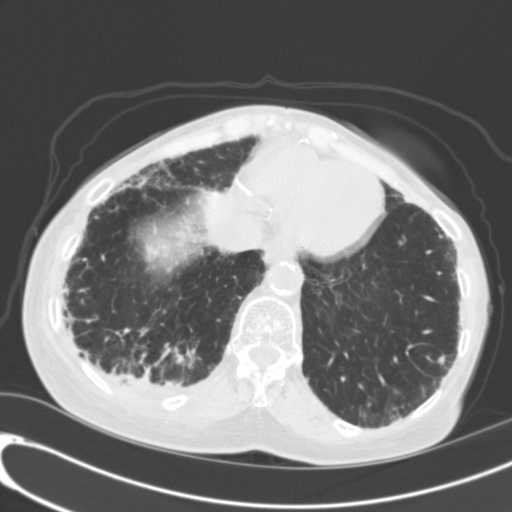
[im 38/106  mediastinal]
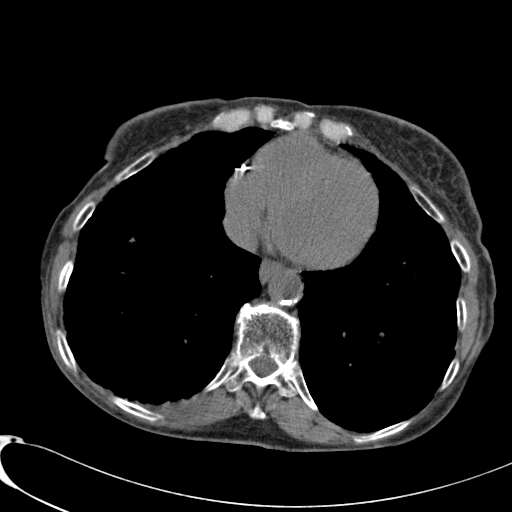
[im 38/106  lung]
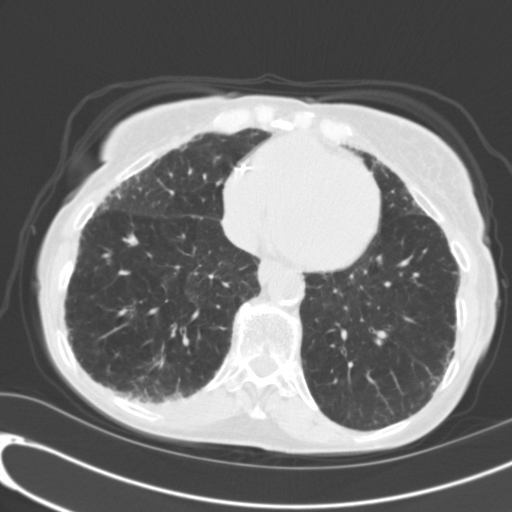
[im 46/106  lung]
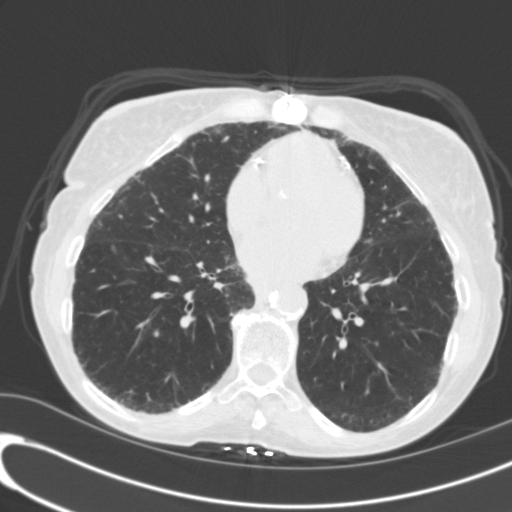
[im 61/106  lung]
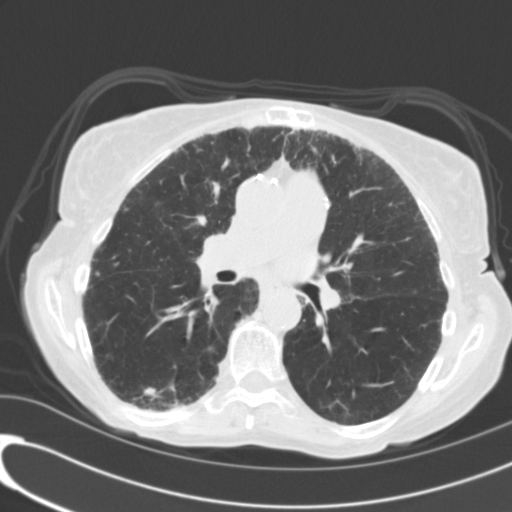
[im 68/106  lung]
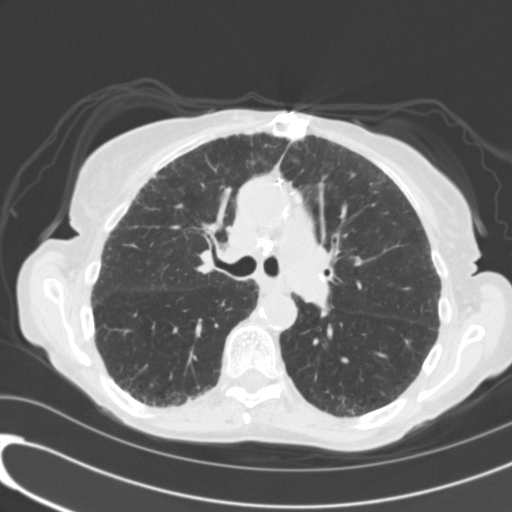
[im 76/106  mediastinal]
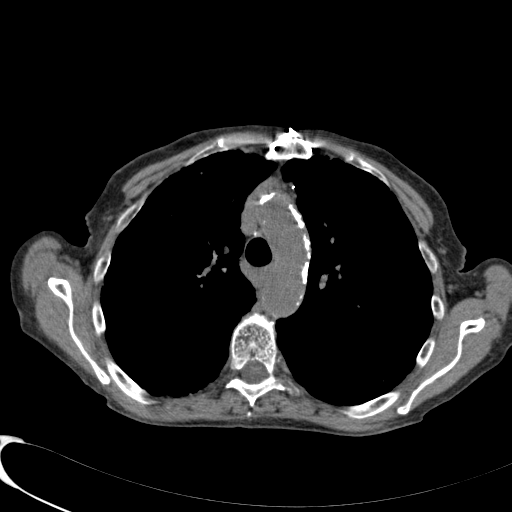
[im 76/106  lung]
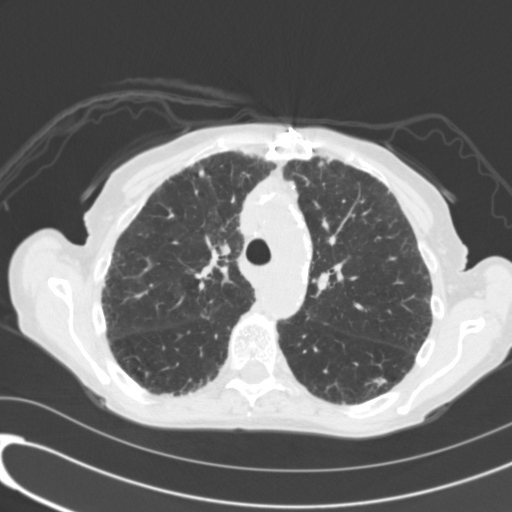
[im 83/106  lung]
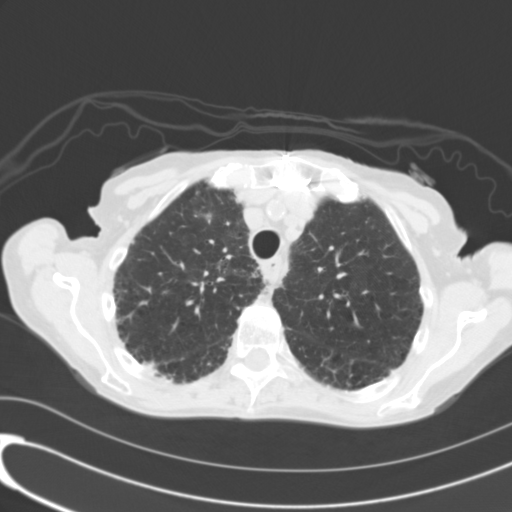
[im 91/106  lung]
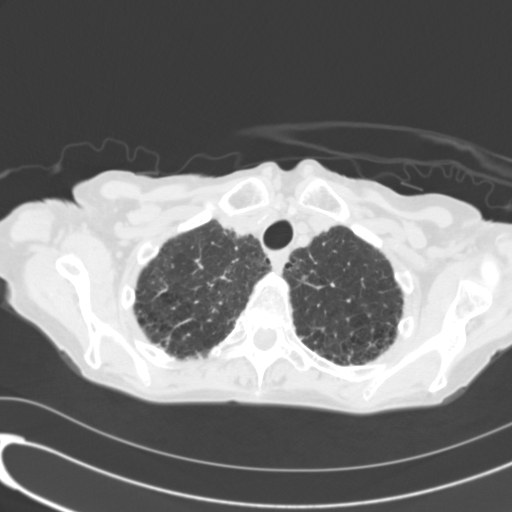
[im 98/106  lung]
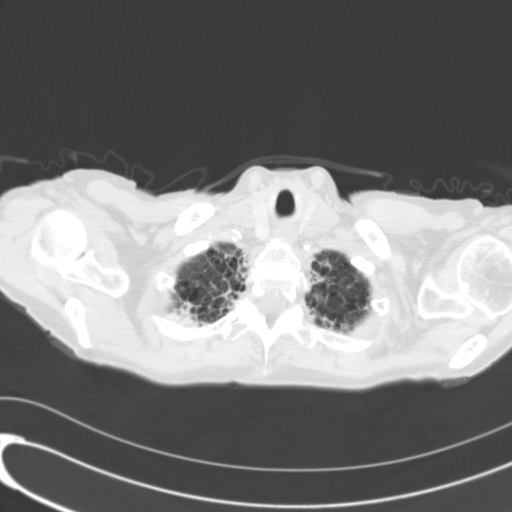

[Series 602: cor · coronal · 0.65mm/px · 3 of 114 slices shown]
[im 23/114  lung]
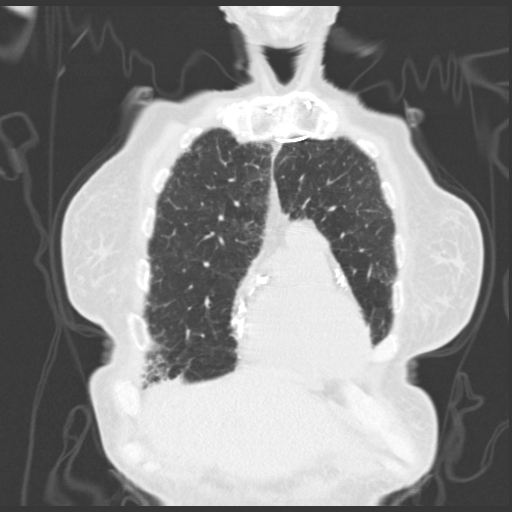
[im 46/114  lung]
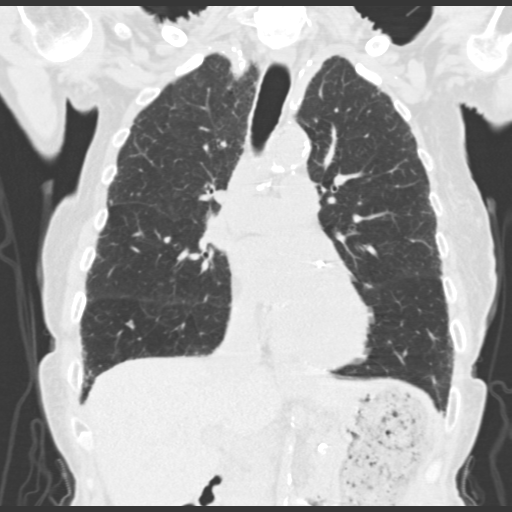
[im 68/114  lung]
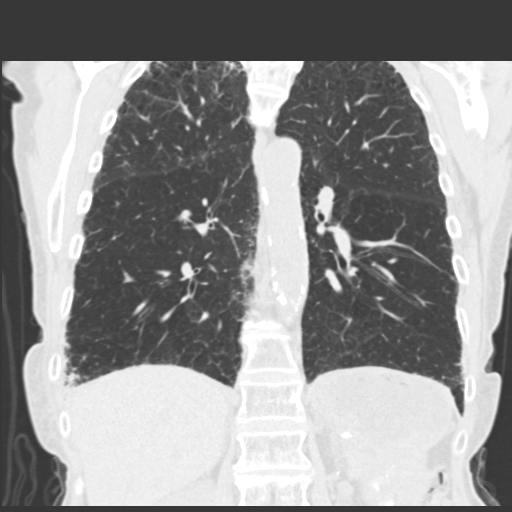

[15 of 36 positions shown; findings below may reference images not displayed]

FINDINGS: Mediastinum/Lymph Nodes: No masses or pathologically enlarged lymph
nodes identified on this un-enhanced exam. Normal heart size. No
evidence pericardial effusion.

Lungs/Pleura: Treated subpleural posterior right lower lobe
pulmonary nodule currently measures 2.6 x 1.4 cm on image 72/series
5, and is not significantly changed since prior exam. Adjacent post
radiation changes are again noted. An ill-defined pulmonary nodular
opacity in the right lower lobe measuring 1.4 cm on image 49 shows
no significant change. There arm numerous other widely scattered
sub-cm pulmonary nodules seen throughout both lungs most of which
are stable. No significant change is seen in the short time interval
compared to most recent CT on 05/08/2015. Severe emphysema again
noted. No evidence of pleural effusion.

Upper abdomen: No acute findings.

Musculoskeletal: No chest wall mass or suspicious bone lesions
identified.
IMPRESSION: Stable size of treated primary malignancy in sub pleural posterior
right lower lobe, with adjacent post radiation changes.

No significant change in other widely scattered bilateral pulmonary
nodules in short time interval compared to recent study of
05/08/2015. Continued followup by CT is recommended in 3-4 months.

Severe emphysema.

No evidence of pleural effusion or lymphadenopathy.

## 2016-07-07 ENCOUNTER — Ambulatory Visit (INDEPENDENT_AMBULATORY_CARE_PROVIDER_SITE_OTHER): Payer: PPO | Admitting: Vascular Surgery

## 2016-07-07 ENCOUNTER — Encounter: Payer: Self-pay | Admitting: Vascular Surgery

## 2016-07-07 ENCOUNTER — Ambulatory Visit (HOSPITAL_COMMUNITY)
Admission: RE | Admit: 2016-07-07 | Discharge: 2016-07-07 | Disposition: A | Discharge status: Home / Self Care | Payer: PPO | Source: Ambulatory Visit | Attending: Vascular Surgery | Admitting: Vascular Surgery

## 2016-07-07 VITALS — BP 152/56 | HR 63 | Temp 97.6°F | Resp 16 | Ht 61.0 in | Wt 94.0 lb

## 2016-07-07 DIAGNOSIS — I6523 Occlusion and stenosis of bilateral carotid arteries: Secondary | ICD-10-CM | POA: Diagnosis not present

## 2016-07-07 DIAGNOSIS — I6522 Occlusion and stenosis of left carotid artery: Secondary | ICD-10-CM

## 2016-07-07 LAB — VAS US CAROTID
LCCADDIAS: -13 cm/s
LEFT ECA DIAS: -14 cm/s
LEFT VERTEBRAL DIAS: -13 cm/s
LICADDIAS: -33 cm/s
LICADSYS: -169 cm/s
Left CCA dist sys: -145 cm/s
Left CCA prox dias: 21 cm/s
Left CCA prox sys: 170 cm/s
Left ICA prox dias: -51 cm/s
Left ICA prox sys: -203 cm/s
RIGHT CCA MID DIAS: 17 cm/s
RIGHT ECA DIAS: -5 cm/s
RIGHT VERTEBRAL DIAS: -17 cm/s
Right CCA prox dias: 34 cm/s
Right CCA prox sys: 301 cm/s
Right cca dist sys: -101 cm/s

## 2016-07-07 NOTE — Progress Notes (Signed)
[facility] [place]  Patient name: Tonya Valdez MRN: [id number] DOB: January 08, 1941 Sex: female  REASON FOR VISIT: Follow-up bilateral carotid endarterectomy  HPI: Tonya Valdez is a 76 y.o. female here today for follow-up of staged bilateral carotid endarterectomies. Most recently she underwent left carotid endarterectomy for high-grade asymptomatic stenosis in September 2017. She had undergone right carotid endarterectomy in September 2014 the same time as coronary artery bypass grafting by Dr. Cyndia Bent. She remains completely asymptomatic. 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 She remains quite active.  Past Medical History:  Diagnosis Date  . Bilateral carotid artery disease ([place]) 09/26/2012   carotid US (8/14):  R 80-99; L 60-79 => s/p R CEA 10/2012 (Dr. Donnetta Hutching)  . Bladder cancer ([place]) 04/26/2013  . Bladder tumor   . CAD (coronary artery disease)    a. LHC (10/07/12):  Ostial LM 60%, distal LM 40-50%, oLAD 40-50%, mLAD 70-80%, pCFX subtotally occluded, mCFX 80-90%, oRCA occluded, ostial L subclavian 40%, EF 25-30%, abdominal aorta diffusely diseased with severe ulcerated plaquing (right iliac ostial 95%; left renal long 60-70%, right okay). CO 2.1, CI 1.5 => s/p CABG (L-LAD, S-OM, S-PDA) 10/2012 with Dr. Cyndia Bent   . Cancer ([place])    bladder  . Cataract    left eye  . Chronic systolic CHF (congestive heart failure) ([place]) 08/04/2012   Echo (08/17/12): EF 30%, diffuse HK, mild MR, mild BAE, PASP.;  Echo (01/2013): EF 40-45%, inferior posterior HK, Gr 2 DD, mild MR, mild LAE  . COPD (chronic obstructive pulmonary disease) ([place])   . 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 H/O hiatal hernia    s/p  76 yrs old  . Hyperkalemia    ACEI d/c'd 11/2012  . Hyperlipidemia   . Hypertension    controlled with medications  . Ischemic  cardiomyopathy   . Lung cancer ([place])   . Lung nodule    chest CT 09/2012 => needs repeat by 03/2013  . PAD (peripheral artery disease) ([place])   . 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 Radiation 4/26,4/28,5/3,5/5,5/10   Left lung 5 fractions at 10 Gy  . Wears glasses     Family History  Problem Relation Age of Onset  . Heart disease Mother        MI at age 60  . Cervical cancer Mother   . Cancer Mother   . Diabetes Mother   . Hypertension Mother   . Lung cancer Father 33       primary site lung CA then colon and bone  . Colon cancer Father   . Cancer Father   . Hypertension Father   . Cancer Other        colon cancer  . Hypertension Other   . Kidney disease Other   . Diabetes Other     SOCIAL HISTORY: Social History  Substance Use Topics  . Smoking status: Former Smoker    Packs/day: 1.00    Years: 50.00    Types: Cigarettes    Quit date: 04/23/2013  . Smokeless tobacco: Never Used  . Alcohol use No     Comment: occasional wine not often    Allergies  Allergen Reactions  . Ace Inhibitors Other (See Comments)    Hyperkalemia; pt denies  Current Outpatient Prescriptions  Medication Sig Dispense Refill  . aspirin EC 325 MG EC tablet Take 1 tablet (325 mg total) by mouth daily. 30 tablet 0  . Blood Glucose Monitoring Suppl (ONE TOUCH ULTRA 2) w/Device KIT Use to check blood sugars daily 1 each 0  . carvedilol (COREG) 12.5 MG tablet TAKE 1 AND 1/2 TABLETS BY MOUTH TWICE DAILY WITH A MEAL 270 tablet 3  . furosemide (LASIX) 20 MG tablet Take 1 tablet (20 mg total) by mouth daily. 90 tablet 3  . glucose blood (ONE TOUCH ULTRA TEST) test strip 1 each by Other route 4 (four) times daily. Use to check blood sugar four times a day Dx E11.9 400 each 3  . hydrALAZINE (APRESOLINE) 25 MG tablet Take 1 tablet (25 mg total) by mouth 3 (three) times daily. 270 tablet 3  . Hypromellose (ARTIFICIAL TEARS OP) Place 1 drop into both eyes  4 (four) times daily as needed (for dry eyes).    . isosorbide mononitrate (IMDUR) 30 MG 24 hr tablet Take 1 tablet (30 mg total) by mouth daily. 90 tablet 3  . Lancets (ONETOUCH ULTRASOFT) lancets 1 each by Other route 4 (four) times daily. Use to help check blood sugars four times a day Dx E11.9 400 each 3  . metFORMIN (GLUCOPHAGE) 500 MG tablet Take 1 tablet by mouth twice a day with a meal 180 tablet 1  . Multiple Vitamin (MULTIVITAMIN) tablet Take 1 tablet by mouth daily.    . Omega-3 Fatty Acids (FISH OIL) 1000 MG CAPS Take 1,000 mg by mouth daily.     . pravastatin (PRAVACHOL) 40 MG tablet Take 1 tablet (40 mg total) by mouth every evening. 90 tablet 3   No current facility-administered medications for this visit.     REVIEW OF SYSTEMS:  [X]  denotes positive finding, [ ]  denotes negative finding Cardiac  Comments:  Chest pain or chest pressure:    Shortness of breath upon exertion:    Short of breath when lying flat:    Irregular heart rhythm:        Vascular    Pain in calf, thigh, or hip brought on by ambulation:    Pain in feet at night that wakes you up from your sleep:     Blood clot in your veins:    Leg swelling:           PHYSICAL EXAM: Vitals:   07/07/16 1555 07/07/16 1556  BP: (!) 150/52 (!) 152/56  Pulse: 63   Resp: 16   Temp: 97.6 F (36.4 C)   SpO2: 96%   Weight: 94 lb (42.6 kg)   Height: 5' 1"  (1.549 m)     GENERAL: The patient is a well-nourished female, in no acute distress. The vital signs are documented above. CARDIOVASCULAR: Harsh carotid bruits bilaterally. Well-healed neck incisions bilaterally PULMONARY: There is good air exchange  MUSCULOSKELETAL: There are no major deformities or cyanosis. NEUROLOGIC: No focal weakness or paresthesias are detected. SKIN: There are no ulcers or rashes noted. PSYCHIATRIC: The patient has a normal affect.  DATA:  Duplex today reveals widely patent endarterectomy site bilaterally. Does have stenosis of her  left external carotid artery  MEDICAL ISSUES: Stable overall. No symptoms related to carotid disease. Will continue usual activities. Plan see her again in 6 months with carotid duplex. We'll then drop back to yearly assuming this is normal.    Rosetta Posner, MD FACS [facility] Tel (  [phone] Pager [phone]

## 2016-07-14 NOTE — Addendum Note (Signed)
Addended by: Lianne Cure A on: 07/14/2016 01:13 PM   Modules accepted: Orders

## 2016-07-15 ENCOUNTER — Ambulatory Visit (INDEPENDENT_AMBULATORY_CARE_PROVIDER_SITE_OTHER): Payer: PPO | Admitting: *Deleted

## 2016-07-15 VITALS — BP 144/62 | HR 68 | Resp 20 | Ht 61.0 in | Wt 94.0 lb

## 2016-07-15 DIAGNOSIS — Z0000 Encounter for general adult medical examination without abnormal findings: Secondary | ICD-10-CM

## 2016-07-15 NOTE — Patient Instructions (Signed)
Continue to eat heart healthy diet (full of fruits, vegetables, whole grains, lean protein, water--limit salt, fat, and sugar intake) and increase physical activity as tolerated.  .Continue doing brain stimulating activities (puzzles, reading, adult coloring books, staying active) to keep memory sharp.    Tonya Valdez , Thank you for taking time to come for your Medicare Wellness Visit. I appreciate your ongoing commitment to your health goals. Please review the following plan we discussed and let me know if I can assist you in the future.   These are the goals we discussed: Goals    . Maintain my current health status and well being.          Stay active and be independent       This is a list of the screening recommended for you and due dates:  Health Maintenance  Topic Date Due  . Flu Shot  09/23/2016  . Hemoglobin A1C  11/06/2016  . Eye exam for diabetics  11/13/2016  . Colon Cancer Screening  12/31/2016  . Complete foot exam   05/06/2017  . Urine Protein Check  05/06/2017  . Tetanus Vaccine  05/07/2026  . DEXA scan (bone density measurement)  Completed  . Pneumonia vaccines  Completed

## 2016-07-15 NOTE — Progress Notes (Signed)
Pre visit review using our clinic review tool, if applicable. No additional management support is needed unless otherwise documented below in the visit note. 

## 2016-07-15 NOTE — Progress Notes (Addendum)
Subjective:   Tonya Valdez is a 76 y.o. female who presents for an Initial Medicare Annual Wellness Visit.  Review of Systems    No ROS.  Medicare Wellness Visit.   Cardiac Risk Factors include: diabetes mellitus;hypertension Sleep patterns: no sleep issues, feels rested on waking, does not get up to void and sleeps 7-8 hours nightly.   Home Safety/Smoke Alarms:   Living environment; residence and Firearm Safety: 1-story house/ trailer, no firearms. Lives alone, no needs for DME, good family support Seat Belt Safety/Bike Helmet: Wears seat belt.   Counseling:   Eye Exam- appointment yearly   Dental- appointment every 6 months   Female:   Pap-  N/A     Mammo- Last 05/22/13,  BI-RADS CATEGORY  1: Negative      Dexa scan- Last 05/18/16, osteoporosis      CCS- Last 01/01/12, Precancerous polyps, recall 5 years     Objective:    Today's Vitals   07/15/16 0807  BP: (!) 144/62  Pulse: 68  Resp: 20  SpO2: 98%  Weight: 94 lb (42.6 kg)  Height: 5' 1"  (1.549 m)   Body mass index is 17.76 kg/m.   Current Medications (verified) Outpatient Encounter Prescriptions as of 07/15/2016  Medication Sig  . aspirin EC 325 MG EC tablet Take 1 tablet (325 mg total) by mouth daily.  . Blood Glucose Monitoring Suppl (ONE TOUCH ULTRA 2) w/Device KIT Use to check blood sugars daily  . carvedilol (COREG) 12.5 MG tablet TAKE 1 AND 1/2 TABLETS BY MOUTH TWICE DAILY WITH A MEAL  . furosemide (LASIX) 20 MG tablet Take 1 tablet (20 mg total) by mouth daily.  [name] glucose blood (ONE TOUCH ULTRA TEST) test strip 1 each by Other route 4 (four) times daily. Use to check blood sugar four times a day Dx E11.9  . hydrALAZINE (APRESOLINE) 25 MG tablet Take 1 tablet (25 mg total) by mouth 3 (three) times daily.  . Hypromellose (ARTIFICIAL TEARS OP) Place 1 drop into both eyes 4 (four) times daily as needed (for dry eyes).  . isosorbide mononitrate (IMDUR) 30 MG 24 hr tablet Take 1 tablet (30 mg total) by mouth  daily.  . Lancets (ONETOUCH ULTRASOFT) lancets 1 each by Other route 4 (four) times daily. Use to help check blood sugars four times a day Dx E11.9  . metFORMIN (GLUCOPHAGE) 500 MG tablet Take 1 tablet by mouth twice a day with a meal  . Multiple Vitamin (MULTIVITAMIN) tablet Take 1 tablet by mouth daily.  . Omega-3 Fatty Acids (FISH OIL) 1000 MG CAPS Take 1,000 mg by mouth daily.   . pravastatin (PRAVACHOL) 40 MG tablet Take 1 tablet (40 mg total) by mouth every evening.   No facility-administered encounter medications on file as of 07/15/2016.     Allergies (verified) Ace inhibitors   History: Past Medical History:  Diagnosis Date  . Bilateral carotid artery disease ([place]) 09/26/2012   carotid US (8/14):  R 80-99; L 60-79 => s/p R CEA 10/2012 (Dr. Donnetta Hutching)  . Bladder cancer ([place]) 04/26/2013  . Bladder tumor   . CAD (coronary artery disease)    a. LHC (10/07/12):  Ostial LM 60%, distal LM 40-50%, oLAD 40-50%, mLAD 70-80%, pCFX subtotally occluded, mCFX 80-90%, oRCA occluded, ostial L subclavian 40%, EF 25-30%, abdominal aorta diffusely diseased with severe ulcerated plaquing (right iliac ostial 95%; left renal long 60-70%, right okay). CO 2.1, CI 1.5 => s/p CABG (L-LAD, S-OM, S-PDA) 10/2012 with Dr. Cyndia Bent   .  Cancer ([place])    bladder  . Cataract    left eye  . Chronic systolic CHF (congestive heart failure) ([place]) 08/04/2012   Echo (08/17/12): EF 30%, diffuse HK, mild MR, mild BAE, PASP.;  Echo (01/2013): EF 40-45%, inferior posterior HK, Gr 2 DD, mild MR, mild LAE  . COPD (chronic obstructive pulmonary disease) ([place])   . Diabetes mellitus ([place]) PCP DR Cathlean Cower   Type II, PER PT DOES NOT TAKES METFORMIN PRESCRIBED, SHE WATCHES DIET AND EXERCISES  . H/O hiatal hernia    s/p  76 yrs old  . Hyperkalemia    ACEI d/c'd 11/2012  . Hyperlipidemia   . Hypertension    controlled with medications  . Ischemic cardiomyopathy   . Lung cancer ([place])   . Lung nodule    chest CT 09/2012 => needs repeat  by 03/2013  . PAD (peripheral artery disease) ([place])   . 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 Radiation 4/26,4/28,5/3,5/5,5/10   Left lung 5 fractions at 10 Gy  . Wears glasses    Past Surgical History:  Procedure Laterality Date  . ABDOMINAL HYSTERECTOMY  1981 (APPROX)   BILATERAL SALPINGO-OOPHORECTOMY WITH EXCEPTION A SMALL PIECE OF OVARY REMAINS  . APPENDECTOMY    . CARDIAC CATHETERIZATION    . CHOLECYSTECTOMY    . COLONOSCOPY    . CORONARY ARTERY BYPASS GRAFT N/A 11/14/2012   Procedure: CORONARY ARTERY BYPASS GRAFTING (CABG) times three using left internal mammary artery and left saphenous vein;  Surgeon: Gaye Pollack, MD; L-LAD, S-OM, S-PDA   . ENDARTERECTOMY Right 11/14/2012   Procedure: RIGHT ENDARTERECTOMY CAROTID with patch angioplasty.;  Surgeon: Rosetta Posner, MD;  Location: [facility];  Service: Vascular;  Laterality: Right;  . ENDARTERECTOMY Left 11/08/2015   Procedure: ENDARTERECTOMY CAROTID WITH PATCH ANGIOPLASTY;  Surgeon: Rosetta Posner, MD;  Location: [facility];  Service: Vascular;  Laterality: Left;  . HAMMER TOE SURGERY Left   . HIATAL HERNIA REPAIR  1964  (AGE 73)   CHOLECYSTECTOMY AND APPENDECTOMY  . INTRAOPERATIVE TRANSESOPHAGEAL ECHOCARDIOGRAM N/A 11/14/2012   Procedure: INTRAOPERATIVE TRANSESOPHAGEAL ECHOCARDIOGRAM;  Surgeon: Gaye Pollack, MD;  [facility];  Service: Open Heart Surgery;  Laterality: N/A;  . LEFT ELBOW SURGERY    . [place] SURGERY  1993  . TONSILLECTOMY  1959  . TONSILLECTOMY    . TRANSURETHRAL RESECTION OF BLADDER TUMOR N/A 06/24/2012   Procedure: TRANSURETHRAL RESECTION OF BLADDER TUMOR  WITH GYRUS AND INSTILLATION OF MITOMYCIN C  (TURBT);  Surgeon: Claybon Jabs, MD;  Location: [facility];  Service: Urology;  Laterality: N/A;  . WRIST SURGERY Left    Family History  Problem Relation Age of Onset  . Heart disease Mother        MI at age 40  . Cervical cancer Mother   . Cancer  Mother   . Diabetes Mother   . Hypertension Mother   . Lung cancer Father 53       primary site lung CA then colon and bone  . Colon cancer Father   . Cancer Father   . Hypertension Father   . Cancer Other        colon cancer  . Hypertension Other   . Kidney disease Other   . Diabetes Other    Social History   Occupational History  . RN, BSN [employer]   Social History Main Topics  .  Smoking status: Former Smoker    Packs/day: 1.00    Years: 50.00    Types: Cigarettes    Quit date: 04/23/2013  . Smokeless tobacco: Never Used  . Alcohol use No     Comment: occasional Cordarryl Monrreal not often  . Drug use: No  . Sexual activity: Not Currently    Tobacco Counseling Counseling given: Not Answered   Activities of Daily Living In your present state of health, do you have any difficulty performing the following activities: 07/15/2016 11/08/2015  Hearing? N N  Vision? N N  Difficulty concentrating or making decisions? N N  Walking or climbing stairs? N N  Dressing or bathing? N N  Doing errands, shopping? N N  Preparing Food and eating ? N -  Using the Toilet? N -  In the past six months, have you accidently leaked urine? N -  Do you have problems with loss of bowel control? N -  Managing your Medications? N -  Managing your Finances? N -  Housekeeping or managing your Housekeeping? N -  Some recent data might be hidden    Immunizations and Health Maintenance Immunization History  Administered Date(s) Administered  . Pneumococcal Conjugate-13 04/26/2013  . Pneumococcal Polysaccharide-23 10/07/2011  . Tdap 05/06/2016   There are no preventive care reminders to display for this patient.  Patient Care Team: Biagio Borg, MD as PCP - General (Internal Medicine) Stanford Breed Denice Bors, MD as Consulting Physician (Cardiology) Gaye Pollack, MD as Consulting Physician (Cardiothoracic Surgery)  Indicate any recent Medical Services you may have received from other than [facility]  providers in the past year (date may be approximate).     Assessment:   This is a routine wellness examination for Shonique. Physical assessment deferred to PCP.   Hearing/Vision screen Hearing Screening Comments: Able to hear conversational tones w/o difficulty. No issues reported.   Vision Screening Comments: Wears gkasses  Dietary issues and exercise activities discussed: Current Exercise Habits: The patient has a physically strenous job, but has no regular exercise apart from work., Exercise limited by: None identified  Diet (meal preparation, eat out, water intake, caffeinated beverages, dairy products, fruits and vegetables): in general, a "healthy" diet  , well balanced, diabetic, low fat/ cholesterol, low salt eats a variety of fruits and vegetables daily, limits salt, fat/cholesterol, sugar, caffeine, drinks 6-8 glasses of water daily.       Goals    . Maintain my current health status and well being.          Stay active and be independent      Depression Screen PHQ 2/9 Scores 07/15/2016 06/19/2016 05/06/2016 02/19/2016 10/16/2015 06/20/2015 05/09/2015  PHQ - 2 Score 0 0 0 0 0 0 0    Fall Risk Fall Risk  07/15/2016 06/19/2016 05/06/2016 02/19/2016 10/16/2015  Falls in the past year? No No No No No    Cognitive Function:       Ad8 score reviewed for issues:  Issues making decisions: no  Less interest in hobbies / activities: no  Repeats questions, stories (family complaining): no  Trouble using ordinary gadgets (microwave, computer, phone): no  Forgets the month or year: no  Mismanaging finances: no  Remembering appts: no  Daily problems with thinking and/or memory: no Ad8 score is= 0  Screening Tests Health Maintenance  Topic Date Due  . INFLUENZA VACCINE  09/23/2016  . HEMOGLOBIN A1C  11/06/2016  . OPHTHALMOLOGY EXAM  11/13/2016  . COLONOSCOPY  12/31/2016  . FOOT  EXAM  05/06/2017  . URINE MICROALBUMIN  05/06/2017  . TETANUS/TDAP  05/07/2026  . DEXA  SCAN  Completed  . PNA vac Low Risk Adult  Completed      Plan:    Continue to eat heart healthy diet (full of fruits, vegetables, whole grains, lean protein, water--limit salt, fat, and sugar intake) and increase physical activity as tolerated.  .Continue doing brain stimulating activities (puzzles, reading, adult coloring books, staying active) to keep memory sharp.    I have personally reviewed and noted the following in the patient's chart:   . Medical and social history . Use of alcohol, tobacco or illicit drugs  . Current medications and supplements . Functional ability and status . Nutritional status . Physical activity . Advanced directives . List of other physicians . Vitals . Screenings to include cognitive, depression, and falls . Referrals and appointments  In addition, I have reviewed and discussed with patient certain preventive protocols, quality metrics, and best practice recommendations. A written personalized care plan for preventive services as well as general preventive health recommendations were provided to patient.     Michiel Cowboy, RN   07/15/2016   Medical screening examination/treatment/procedure(s) were performed by non-physician practitioner and as supervising physician I was immediately available for consultation/collaboration. I agree with above. Cathlean Cower, MD

## 2016-08-31 DIAGNOSIS — Z8551 Personal history of malignant neoplasm of bladder: Secondary | ICD-10-CM | POA: Diagnosis not present

## 2016-09-17 ENCOUNTER — Telehealth: Payer: Self-pay | Admitting: General Practice

## 2016-09-17 NOTE — Telephone Encounter (Signed)
Patient is not interested in Prolia at this time.  I did inform patient that if she changes her mind that her co-pay would be $230.00 and Insurance benefits have been verified through the end of the year.  Patient verbalized understanding.

## 2016-10-02 ENCOUNTER — Other Ambulatory Visit: Payer: Self-pay | Admitting: Internal Medicine

## 2016-11-10 ENCOUNTER — Ambulatory Visit: Payer: PPO | Admitting: Internal Medicine

## 2016-12-17 ENCOUNTER — Ambulatory Visit (HOSPITAL_COMMUNITY): Payer: PPO

## 2016-12-17 ENCOUNTER — Telehealth: Payer: Self-pay | Admitting: *Deleted

## 2016-12-17 NOTE — Telephone Encounter (Signed)
Called patient to give her the info. that Dr. Isidore Moos shared with me, patient stated that she wants to wait until she gets back form her trip before she has her scans rescheduled and her fu rescheduled.

## 2016-12-18 ENCOUNTER — Inpatient Hospital Stay: Admission: RE | Admit: 2016-12-18 | Payer: Self-pay | Source: Ambulatory Visit | Admitting: Radiation Oncology

## 2017-01-19 ENCOUNTER — Encounter (HOSPITAL_COMMUNITY): Payer: PPO

## 2017-01-19 ENCOUNTER — Ambulatory Visit: Payer: PPO | Admitting: Vascular Surgery

## 2017-01-25 ENCOUNTER — Encounter: Payer: Self-pay | Admitting: Internal Medicine

## 2017-04-21 ENCOUNTER — Other Ambulatory Visit: Payer: Self-pay | Admitting: Internal Medicine

## 2017-04-24 ENCOUNTER — Other Ambulatory Visit: Payer: Self-pay | Admitting: Cardiology

## 2017-04-26 NOTE — Telephone Encounter (Signed)
REFILL 

## 2017-04-26 NOTE — Telephone Encounter (Signed)
New  Message      *STAT* If patient is at the pharmacy, call can be transferred to refill team.   1. Which medications need to be refilled? (please list name of each medication and dose if known)  carvedilol (COREG) 12.5 MG tablet ,  furosemide (LASIX) 20 MG tablet,  isosorbide mononitrate (IMDUR) 30 MG 24 hr tablet,  metFORMIN (GLUCOPHAGE) 500 MG tablet pravastatin (PRAVACHOL) 40 MG tablet,  hydrALAZINE (APRESOLINE) 25 MG tablet 2. Which pharmacy/location (including street and city if local pharmacy) is medication to be sent to? [facility] [phone]    3. Do they need a 30 day or 90 day supply? 90 days supply

## 2017-07-14 NOTE — Progress Notes (Signed)
Cardiology Office Note   Date:  07/15/2017   ID:  Tonya Valdez, DOB 1941/02/05, MRN [id number]  PCP:  Biagio Borg, MD  Cardiologist: Dr.  Stanford Breed  Chief Complaint  Patient presents with  . Coronary Artery Disease  . Follow-up     History of Present Illness: Tonya Valdez is a 77 y.o. female, retired critical care nurse,  Who presents for ongoing assessment and management of CAD with most recent cardiac cath 2014 with ostial LM 60% ostial LAD 40%-50%, mid LAD of 70%-80%, p CFX subtotally occluded, mCFX 80%-90%, ostial RCA occluded, ostial L subclavian 40%, EF of 25%-30%. She had CABG in 10/2012 with LIMA to LAD, SVG to OM, SVG to PDA) and right CEA.   PAD, with abdominal aorta diffusely diseased with noted severe ulcerated plaquing in the right iliac with ostial 95%, left renal long 60%-70%, right renal normal.  Most recent echocardiogram 2016 revealed improved EF of 47%, with inferior apical ischemia and inferior basal scar. Hx of systolic CHF on lasix.   On last office visit with Dr. Stanford Breed, 02/27/2016, she complained of worsening dyspnea with exertion, relieved with rest. No associated chest pain. She is unable to take ACE due to hyperkalemia. No changes were made in her medical regimen.   The patient has not been seen in approximately 18 months.  Since being seen last she has been treated for bladder cancer and lung cancer with a small lung nodule which included chemotherapy and radiation.  The patient unfortunately continues to smoke.  She has not taken her medications yet this morning.   Past Medical History:  Diagnosis Date  . Bilateral carotid artery disease ([place]) 09/26/2012   carotid US (8/14):  R 80-99; L 60-79 => s/p R CEA 10/2012 (Dr. Donnetta Hutching)  . Bladder cancer ([place]) 04/26/2013  . Bladder tumor   . CAD (coronary artery disease)    a. LHC (10/07/12):  Ostial LM 60%, distal LM 40-50%, oLAD 40-50%, mLAD 70-80%, pCFX subtotally occluded, mCFX 80-90%, oRCA occluded, ostial L  subclavian 40%, EF 25-30%, abdominal aorta diffusely diseased with severe ulcerated plaquing (right iliac ostial 95%; left renal long 60-70%, right okay). CO 2.1, CI 1.5 => s/p CABG (L-LAD, S-OM, S-PDA) 10/2012 with Dr. Cyndia Bent   . Cancer ([place])    bladder  . Cataract    left eye  . Chronic systolic CHF (congestive heart failure) ([place]) 08/04/2012   Echo (08/17/12): EF 30%, diffuse HK, mild MR, mild BAE, PASP.;  Echo (01/2013): EF 40-45%, inferior posterior HK, Gr 2 DD, mild MR, mild LAE  . COPD (chronic obstructive pulmonary disease) ([place])   . 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 H/O hiatal hernia    s/p  77 yrs old  . Hyperkalemia    ACEI d/c'd 11/2012  . Hyperlipidemia   . Hypertension    controlled with medications  . Ischemic cardiomyopathy   . Lung cancer ([place])   . Lung nodule    chest CT 09/2012 => needs repeat by 03/2013  . PAD (peripheral artery disease) ([place])   . 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 Radiation 4/26,4/28,5/3,5/5,5/10   Left lung 5 fractions at 10 Gy  . Wears glasses     Past Surgical History:  Procedure Laterality Date  . ABDOMINAL HYSTERECTOMY  1981 (APPROX)  BILATERAL SALPINGO-OOPHORECTOMY WITH EXCEPTION A SMALL PIECE OF OVARY REMAINS  . APPENDECTOMY    . CARDIAC CATHETERIZATION    . CHOLECYSTECTOMY    . COLONOSCOPY    . CORONARY ARTERY BYPASS GRAFT N/A 11/14/2012   Procedure: CORONARY ARTERY BYPASS GRAFTING (CABG) times three using left internal mammary artery and left saphenous vein;  Surgeon: Gaye Pollack, MD; L-LAD, S-OM, S-PDA   . ENDARTERECTOMY Right 11/14/2012   Procedure: RIGHT ENDARTERECTOMY CAROTID with patch angioplasty.;  Surgeon: Rosetta Posner, MD;  Location: [place];  Service: Vascular;  Laterality: Right;  . ENDARTERECTOMY Left 11/08/2015   Procedure: ENDARTERECTOMY CAROTID WITH PATCH ANGIOPLASTY;  Surgeon:  Rosetta Posner, MD;  Location: [place];  Service: Vascular;  Laterality: Left;  . HAMMER TOE SURGERY Left   . HIATAL HERNIA REPAIR  1964  (AGE 49)   CHOLECYSTECTOMY AND APPENDECTOMY  . INTRAOPERATIVE TRANSESOPHAGEAL ECHOCARDIOGRAM N/A 11/14/2012   Procedure: INTRAOPERATIVE TRANSESOPHAGEAL ECHOCARDIOGRAM;  Surgeon: Gaye Pollack, MD;  Location: [facility] OR;  Service: Open Heart Surgery;  Laterality: N/A;  . LEFT ELBOW SURGERY    . [place] SURGERY  1993  . TONSILLECTOMY  1959  . TONSILLECTOMY    . TRANSURETHRAL RESECTION OF BLADDER TUMOR N/A 06/24/2012   Procedure: TRANSURETHRAL RESECTION OF BLADDER TUMOR  WITH GYRUS AND INSTILLATION OF MITOMYCIN C  (TURBT);  Surgeon: Claybon Jabs, MD;  Location: [facility];  Service: Urology;  Laterality: N/A;  . WRIST SURGERY Left      Current Outpatient Medications  Medication Sig Dispense Refill  . aspirin EC 325 MG EC tablet Take 1 tablet (325 mg total) by mouth daily. 30 tablet 0  . Blood Glucose Monitoring Suppl (ONE TOUCH ULTRA 2) w/Device KIT Use to check blood sugars daily 1 each 0  . carvedilol (COREG) 12.5 MG tablet Take 1 & 1/2 tablets by mouth twice a day with a meal 270 tablet 0  . furosemide (LASIX) 20 MG tablet Take 1 tablet (20 mg total) by mouth daily. NEED OV. 90 tablet 0  . glucose blood (ONE TOUCH ULTRA TEST) test strip 1 each by Other route 4 (four) times daily. Use to check blood sugar four times a day Dx E11.9 400 each 3  . hydrALAZINE (APRESOLINE) 25 MG tablet Take 1 tablet (25 mg total) by mouth 3 (three) times daily. NEED OV. 270 tablet 0  . Hypromellose (ARTIFICIAL TEARS OP) Place 1 drop into both eyes 4 (four) times daily as needed (for dry eyes).    . isosorbide mononitrate (IMDUR) 30 MG 24 hr tablet Take 1 tablet (30 mg total) by mouth daily. NEED OV. 90 tablet 0  . Lancets (ONETOUCH ULTRASOFT) lancets 1 each by Other route 4 (four) times daily. Use to help check blood sugars four times a day Dx E11.9 400 each 3  .  metFORMIN (GLUCOPHAGE) 500 MG tablet Take 1 tablet by mouth twice a day with a meal 180 tablet 0  . Multiple Vitamin (MULTIVITAMIN) tablet Take 1 tablet by mouth daily.    . Omega-3 Fatty Acids (FISH OIL) 1000 MG CAPS Take 1,000 mg by mouth daily.     . pravastatin (PRAVACHOL) 40 MG tablet Take 1 tablet (40 mg total) by mouth every evening. NEED OV. 90 tablet 0   No current facility-administered medications for this visit.     Allergies:   Ace inhibitors    Social History:  The patient  reports that she quit smoking about 4 years ago.  Her smoking use included cigarettes. She has a 50.00 pack-year smoking history. She has never used smokeless tobacco. She reports that she does not drink alcohol or use drugs.   Family History:  The patient's family history includes Cancer in her father, mother, and other; Cervical cancer in her mother; Colon cancer in her father; Diabetes in her mother and other; Heart disease in her mother; Hypertension in her father, mother, and other; Kidney disease in her other; Lung cancer (age of onset: 63) in her father.    ROS: All other systems are reviewed and negative. Unless otherwise mentioned in H&P    PHYSICAL EXAM: VS:  Ht 5' 1"  (1.549 m)   Wt 92 lb 12.8 oz (42.1 kg)   BMI 17.53 kg/m  , BMI Body mass index is 17.53 kg/m. GEN: Well nourished, well developed, in no acute distress  HEENT: normal  Neck: no JVD, 2/6 bilateral harsh carotid bruits, or masses Cardiac: RRR; 3/6 holo-systolic  murmurs, rubs, or gallops,no edema  Respiratory:  Clear to auscultation bilaterally, normal work of breathing GI: soft, nontender, nondistended, + BS, abdominal bruit is auscultated  MS: no deformity or atrophy  Skin: warm and dry, no rash Neuro:  Strength and sensation are intact Psych: euthymic mood, full affect   EKG:  NSR with rate of 70 bpm non-specific T-wave abnormality.   Recent Labs: No results found for requested labs within last 8760 hours.    Lipid  Panel    Component Value Date/Time   CHOL 123 05/06/2016 1412   TRIG 109.0 05/06/2016 1412   HDL 52.90 05/06/2016 1412   CHOLHDL 2 05/06/2016 1412   VLDL 21.8 05/06/2016 1412   LDLCALC 48 05/06/2016 1412   LDLDIRECT 127.6 10/07/2011 1037      Wt Readings from Last 3 Encounters:  07/15/17 92 lb 12.8 oz (42.1 kg)  07/15/16 94 lb (42.6 kg)  07/07/16 94 lb (42.6 kg)      Other studies Reviewed: Echocardiogram 05/17/14 Left ventricle: The cavity size was normal. There was mild concentric hypertrophy. Systolic function was normal. The estimated ejection fraction was in the range of 55% to 60%. Wall motion was normal; there were no regional wall motion abnormalities. Doppler parameters are consistent with pseudonormal left ventricular relaxation (grade 2 diastolic dysfunction). The E/A ratio is >1.5. The E/e&' ratio is >15, suggesting elevated LV filling pressure. - Aortic valve: Trileaflet. Sclerosis without stenosis. There was no regurgitation. - Mitral valve: Calcified annulus. Mildly thickened leaflets . There was mild regurgitation. - Left atrium: Moderately dilated at 40 ml/m2. - Tricuspid valve: There was mild regurgitation. - Pulmonary arteries: PA peak pressure: 31 mm Hg (S). - Inferior vena cava: The vessel was normal in size. The respirophasic diameter changes were in the normal range (>= 50%), consistent with normal central venous pressure.  Stress Test 05/16/2014 Stress ECG: No significant change from baseline ECG  QPS Raw Data Images:  Normal; no motion artifact; normal heart/lung ratio. Stress Images:  There is decreased uptake in the inferior wall. Rest Images:  There is decreased uptake in the inferior wall. Subtraction (SDS):  These findings are consistent with ischemia.  Impression Exercise Capacity:  Lexiscan with no exercise. BP Response:  Normal blood pressure response. Clinical Symptoms:  No significant symptoms noted. ECG  Impression:  No significant ST segment change suggestive of ischemia. Comparison with Prior Nuclear Study: No images to compare  Overall Impression:  Intermediate risk stress nuclear study Inferoapical ischemia, inferobasal scar. Follow up with Dr.  Crenshaw.  ASSESSMENT AND PLAN:  1. CAD: She offers no symptoms of chest pain, or worsening dyspnea. She has undergone treatment for bladder cancer and lung cancer with radiation and chemo, lost over 100 lbs, and therefore stamina is diminished. I am hearing a loud AoV murmur. She will need to have repeat echocardiogram to assess valve status and LV fx.   2. Bilateral carotid bruits: History of carotid artery stenosis with endarterectomy.  I am hearing significant bilateral carotid bruit.  I will repeat bilateral Doppler ultrasound for evaluation of current status.  3. Hypertension: Elevated on this office visit.  The patient has not taken her medications today as she has not eaten yet.  She would like to be removed from hydralazine dose.  However I will not make any changes and tell I see her blood pressure recordings on medications.  She is advised to take her medications before next follow-up visit so that we can ascertain blood pressure control and need for adjustment in medications.  4.  Hypercholesterolemia: I will check fasting lipids and LFTs along with BMET and CBC today.  5.  Ongoing tobacco abuse: I have counseled the patient on tobacco cessation, especially in light of known history of lung cancer and bladder cancer with coronary artery disease.  She verbalizes understanding but does not appear to be motivated to quit although she has cut down.   Current medicines are reviewed at length with the patient today.    Labs/ tests ordered today include: Hgb A1c, BMET, TSH, CBC,Doppler ultrasound, and echocardiogram.   Phill Myron. West Pugh, ANP, AACC   07/15/2017 8:22 AM    [facility] [address]  [place], [place] [address] Phone: [phone]; Fax: [phone]

## 2017-07-15 ENCOUNTER — Other Ambulatory Visit: Payer: Self-pay | Admitting: Adult Health

## 2017-07-15 ENCOUNTER — Other Ambulatory Visit: Payer: Self-pay | Admitting: Internal Medicine

## 2017-07-15 ENCOUNTER — Other Ambulatory Visit (INDEPENDENT_AMBULATORY_CARE_PROVIDER_SITE_OTHER): Payer: PPO

## 2017-07-15 ENCOUNTER — Ambulatory Visit (INDEPENDENT_AMBULATORY_CARE_PROVIDER_SITE_OTHER): Payer: PPO | Admitting: Internal Medicine

## 2017-07-15 ENCOUNTER — Encounter: Payer: Self-pay | Admitting: Internal Medicine

## 2017-07-15 ENCOUNTER — Ambulatory Visit: Payer: PPO | Admitting: Adult Health

## 2017-07-15 ENCOUNTER — Encounter: Payer: Self-pay | Admitting: Adult Health

## 2017-07-15 ENCOUNTER — Telehealth: Payer: Self-pay

## 2017-07-15 VITALS — BP 142/86 | HR 78 | Temp 97.6°F | Ht 61.0 in | Wt 92.0 lb

## 2017-07-15 VITALS — BP 210/73 | HR 70 | Ht 61.0 in | Wt 92.8 lb

## 2017-07-15 DIAGNOSIS — E7800 Pure hypercholesterolemia, unspecified: Secondary | ICD-10-CM | POA: Diagnosis not present

## 2017-07-15 DIAGNOSIS — I6523 Occlusion and stenosis of bilateral carotid arteries: Secondary | ICD-10-CM | POA: Diagnosis not present

## 2017-07-15 DIAGNOSIS — D649 Anemia, unspecified: Secondary | ICD-10-CM | POA: Diagnosis not present

## 2017-07-15 DIAGNOSIS — Z720 Tobacco use: Secondary | ICD-10-CM

## 2017-07-15 DIAGNOSIS — Z0000 Encounter for general adult medical examination without abnormal findings: Secondary | ICD-10-CM | POA: Diagnosis not present

## 2017-07-15 DIAGNOSIS — I350 Nonrheumatic aortic (valve) stenosis: Secondary | ICD-10-CM

## 2017-07-15 DIAGNOSIS — D509 Iron deficiency anemia, unspecified: Secondary | ICD-10-CM

## 2017-07-15 DIAGNOSIS — E538 Deficiency of other specified B group vitamins: Secondary | ICD-10-CM | POA: Diagnosis not present

## 2017-07-15 DIAGNOSIS — E119 Type 2 diabetes mellitus without complications: Secondary | ICD-10-CM

## 2017-07-15 DIAGNOSIS — Z79899 Other long term (current) drug therapy: Secondary | ICD-10-CM | POA: Diagnosis not present

## 2017-07-15 DIAGNOSIS — I10 Essential (primary) hypertension: Secondary | ICD-10-CM | POA: Diagnosis not present

## 2017-07-15 DIAGNOSIS — E559 Vitamin D deficiency, unspecified: Secondary | ICD-10-CM

## 2017-07-15 DIAGNOSIS — I6522 Occlusion and stenosis of left carotid artery: Secondary | ICD-10-CM | POA: Diagnosis not present

## 2017-07-15 DIAGNOSIS — I2510 Atherosclerotic heart disease of native coronary artery without angina pectoris: Secondary | ICD-10-CM | POA: Diagnosis not present

## 2017-07-15 LAB — URINALYSIS, ROUTINE W REFLEX MICROSCOPIC
Bilirubin Urine: NEGATIVE
Hgb urine dipstick: NEGATIVE
KETONES UR: NEGATIVE
Leukocytes, UA: NEGATIVE
NITRITE: NEGATIVE
Specific Gravity, Urine: 1.015 (ref 1.000–1.030)
Total Protein, Urine: 100 — AB
URINE GLUCOSE: NEGATIVE
UROBILINOGEN UA: 0.2 (ref 0.0–1.0)
WBC, UA: NONE SEEN (ref 0–?)
pH: 7.5 (ref 5.0–8.0)

## 2017-07-15 LAB — HEPATIC FUNCTION PANEL
ALT: 11 U/L (ref 0–35)
AST: 21 U/L (ref 0–37)
Albumin: 3.7 g/dL (ref 3.5–5.2)
Alkaline Phosphatase: 91 U/L (ref 39–117)
BILIRUBIN TOTAL: 0.5 mg/dL (ref 0.2–1.2)
Bilirubin, Direct: 0.1 mg/dL (ref 0.0–0.3)
Total Protein: 7.9 g/dL (ref 6.0–8.3)

## 2017-07-15 LAB — VITAMIN D 25 HYDROXY (VIT D DEFICIENCY, FRACTURES): VITD: 24.01 ng/mL — ABNORMAL LOW (ref 30.00–100.00)

## 2017-07-15 LAB — BASIC METABOLIC PANEL
BUN: 12 mg/dL (ref 6–23)
CO2: 29 mEq/L (ref 19–32)
Calcium: 10 mg/dL (ref 8.4–10.5)
Chloride: 101 mEq/L (ref 96–112)
Creatinine, Ser: 0.79 mg/dL (ref 0.40–1.20)
GFR: 74.85 mL/min (ref >60.00)
Glucose, Bld: 135 mg/dL — ABNORMAL HIGH (ref 70–99)
POTASSIUM: 4.6 meq/L (ref 3.5–5.1)
SODIUM: 139 meq/L (ref 135–145)

## 2017-07-15 LAB — LIPID PANEL
CHOL/HDL RATIO: 2
Cholesterol: 131 mg/dL (ref 0–200)
HDL: 58.4 mg/dL (ref >39.00)
LDL CALC: 54 mg/dL (ref 0–99)
NONHDL: 72.29
Triglycerides: 93 mg/dL (ref 0.0–149.0)
VLDL: 18.6 mg/dL (ref 0.0–40.0)

## 2017-07-15 LAB — MICROALBUMIN / CREATININE URINE RATIO
Creatinine,U: 26.5 mg/dL
MICROALB/CREAT RATIO: 270.8 mg/g — AB (ref 0.0–30.0)
Microalb, Ur: 71.8 mg/dL — ABNORMAL HIGH (ref 0.0–1.9)

## 2017-07-15 LAB — HEMOGLOBIN A1C: Hgb A1c MFr Bld: 7 % — ABNORMAL HIGH (ref 4.6–6.5)

## 2017-07-15 LAB — IBC PANEL
Iron: 24 ug/dL — ABNORMAL LOW (ref 42–145)
Saturation Ratios: 7.7 % — ABNORMAL LOW (ref 20.0–50.0)
TRANSFERRIN: 223 mg/dL (ref 212.0–360.0)

## 2017-07-15 LAB — CBC WITH DIFFERENTIAL/PLATELET
BASOS PCT: 1 % (ref 0.0–3.0)
Basophils Absolute: 0.1 10*3/uL (ref 0.0–0.1)
EOS ABS: 0.2 10*3/uL (ref 0.0–0.7)
Eosinophils Relative: 1.5 % (ref 0.0–5.0)
HEMATOCRIT: 34.3 % — AB (ref 36.0–46.0)
Hemoglobin: 11.7 g/dL — ABNORMAL LOW (ref 12.0–15.0)
Lymphocytes Relative: 17.2 % (ref 12.0–46.0)
Lymphs Abs: 2.6 10*3/uL (ref 0.7–4.0)
MCHC: 34.1 g/dL (ref 30.0–36.0)
MCV: 88.9 fl (ref 78.0–100.0)
MONO ABS: 1 10*3/uL (ref 0.1–1.0)
Monocytes Relative: 6.9 % (ref 3.0–12.0)
NEUTROS ABS: 11.1 10*3/uL — AB (ref 1.4–7.7)
Neutrophils Relative %: 73.4 % (ref 43.0–77.0)
PLATELETS: 378 10*3/uL (ref 150.0–400.0)
RBC: 3.86 Mil/uL — ABNORMAL LOW (ref 3.87–5.11)
RDW: 14.7 % (ref 11.5–15.5)
WBC: 15.2 10*3/uL — ABNORMAL HIGH (ref 4.0–10.5)

## 2017-07-15 LAB — VITAMIN B12: Vitamin B-12: 350 pg/mL (ref 211–911)

## 2017-07-15 LAB — TSH: TSH: 3.11 u[IU]/mL (ref 0.35–4.50)

## 2017-07-15 MED ORDER — FUROSEMIDE 20 MG PO TABS: 20.0000 mg | ORAL_TABLET | Freq: Every day | ORAL | 3 refills | Status: AC

## 2017-07-15 MED ORDER — ASPIRIN 81 MG PO TBEC: 81.0000 mg | DELAYED_RELEASE_TABLET | Freq: Every day | ORAL | Status: DC

## 2017-07-15 MED ORDER — HYDRALAZINE HCL 25 MG PO TABS: 25.0000 mg | ORAL_TABLET | Freq: Three times a day (TID) | ORAL | 3 refills | Status: AC

## 2017-07-15 MED ORDER — FERROUS SULFATE DRIED ER 160 (50 FE) MG PO TBCR: 1.0000 | EXTENDED_RELEASE_TABLET | Freq: Every day | ORAL | 5 refills | Status: AC

## 2017-07-15 MED ORDER — CARVEDILOL 12.5 MG PO TABS: ORAL_TABLET | ORAL | 3 refills | Status: AC

## 2017-07-15 MED ORDER — GLUCOSE BLOOD VI STRP: 1.0000 | ORAL_STRIP | Freq: Four times a day (QID) | 3 refills | Status: DC

## 2017-07-15 MED ORDER — ISOSORBIDE MONONITRATE ER 30 MG PO TB24: 30.0000 mg | ORAL_TABLET | Freq: Every day | ORAL | 3 refills | Status: AC

## 2017-07-15 MED ORDER — METFORMIN HCL 500 MG PO TABS: ORAL_TABLET | ORAL | 3 refills | Status: AC

## 2017-07-15 MED ORDER — METFORMIN HCL 500 MG PO TABS: ORAL_TABLET | ORAL | 1 refills | Status: DC

## 2017-07-15 MED ORDER — PRAVASTATIN SODIUM 40 MG PO TABS: 40.0000 mg | ORAL_TABLET | Freq: Every evening | ORAL | 3 refills | Status: AC

## 2017-07-15 MED ORDER — VITAMIN D (ERGOCALCIFEROL) 1.25 MG (50000 UNIT) PO CAPS: 50000.0000 [IU] | ORAL_CAPSULE | ORAL | 0 refills | Status: AC

## 2017-07-15 NOTE — Patient Instructions (Signed)

## 2017-07-15 NOTE — Patient Instructions (Signed)
Medication Instructions:  DECREASE ASPIRIN TO 81MG   If you need a refill on your cardiac medications before your next appointment, please call your pharmacy.  Labwork: BMP,LIPID,LFT,CBC.A1C AND TSH TODAY HERE IN OUR OFFICE AT [facility]  Take the provided lab slips with you to the lab for your blood draw.   Testing/Procedures: Echocardiogram - Your physician has requested that you have an echocardiogram. Echocardiography is a painless test that uses sound waves to create images of your heart. It provides your doctor with information about the size and shape of your heart and how well your heart's chambers and valves are working. This procedure takes approximately one hour. There are no restrictions for this procedure. This will be performed at our [facility] location - [address], [address].  Your physician has requested that you have a carotid duplex. This test is an ultrasound of the carotid arteries in your neck. It looks at blood flow through these arteries that supply the brain with blood. Allow one hour for this exam. There are no restrictions or special instructions.  Special Instructions: MAKE SURE TO TAKE YOUR MEDICATION BEFORE YOUR FOLLOW UP APPOINTMENTS  Follow-Up: Your physician wants you to follow-up in: [place] ([place]), DNP,AACC IF PRIMARY CARDIOLOGIST IS UNAVAILABLE.    Thank you for choosing [facility] at [facility]!!

## 2017-07-15 NOTE — Telephone Encounter (Signed)
-----   Message from Biagio Borg, MD sent at 07/15/2017 12:56 PM EDT ----- Left message on MyChart, pt to cont same tx except  The test results show that your current treatment is OK, except the Vitamin D and iron are both low.  Please take Vit D 50,000 units per wk for 12 wks only with a prescription I will send.  After that you can take OTC Vit D 2000 units per day (indefinitely).  Also please start Slo-iron - 1 per day for the low iron, and we will need to refer you to Gastroenterology for further consideration.    Shirron to please inform pt, I will do referral to GI and rx for Vit D and Slo iron

## 2017-07-15 NOTE — Assessment & Plan Note (Signed)
Likely chronic dz, but for iron as well

## 2017-07-15 NOTE — Assessment & Plan Note (Signed)
stable overall by history and exam, recent data reviewed with pt, and pt to continue medical treatment as before,  to f/u any worsening symptoms or concerns, for f/u lab today

## 2017-07-15 NOTE — Progress Notes (Signed)
Subjective:    Patient ID: Tonya Valdez, female    DOB: September 15, 1940, 77 y.o.   MRN: [id number]  HPI Here for wellness and f/u;  Overall doing ok;  Pt denies Chest pain, worsening SOB, DOE, wheezing, orthopnea, PND, worsening LE edema, palpitations, dizziness or syncope.  Pt denies neurological change such as new headache, facial or extremity weakness.  Pt denies polydipsia, polyuria, or low sugar symptoms. Pt states overall good compliance with treatment and medications, good tolerability, and has been trying to follow appropriate diet.  Pt denies worsening depressive symptoms, suicidal ideation or panic. No fever, night sweats, though mentions low appetite and lost another 2 lbs, but no other constitutional symptoms.  Pt states good ability with ADL's, has low fall risk, home safety reviewed and adequate, no other significant changes in hearing or vision, and not active with exercise. No acute complaints or other interval hx Wt Readings from Last 3 Encounters:  07/15/17 92 lb (41.7 kg)  07/15/17 92 lb 12.8 oz (42.1 kg)  07/15/16 94 lb (42.6 kg)   Past Medical History:  Diagnosis Date  . Bilateral carotid artery disease ([place]) 09/26/2012   carotid US (8/14):  R 80-99; L 60-79 => s/p R CEA 10/2012 (Dr. Donnetta Hutching)  . Bladder cancer ([place]) 04/26/2013  . Bladder tumor   . CAD (coronary artery disease)    a. LHC (10/07/12):  Ostial LM 60%, distal LM 40-50%, oLAD 40-50%, mLAD 70-80%, pCFX subtotally occluded, mCFX 80-90%, oRCA occluded, ostial L subclavian 40%, EF 25-30%, abdominal aorta diffusely diseased with severe ulcerated plaquing (right iliac ostial 95%; left renal long 60-70%, right okay). CO 2.1, CI 1.5 => s/p CABG (L-LAD, S-OM, S-PDA) 10/2012 with Dr. Cyndia Bent   . Cancer ([place])    bladder  . Cataract    left eye  . Chronic systolic CHF (congestive heart failure) ([place]) 08/04/2012   Echo (08/17/12): EF 30%, diffuse HK, mild MR, mild BAE, PASP.;  Echo (01/2013): EF 40-45%, inferior posterior HK, Gr 2 DD,  mild MR, mild LAE  . COPD (chronic obstructive pulmonary disease) ([place])   . 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 H/O hiatal hernia    s/p  77 yrs old  . Hyperkalemia    ACEI d/c'd 11/2012  . Hyperlipidemia   . Hypertension    controlled with medications  . Ischemic cardiomyopathy   . Lung cancer ([place])   . Lung nodule    chest CT 09/2012 => needs repeat by 03/2013  . PAD (peripheral artery disease) ([place])   . 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 Radiation 4/26,4/28,5/3,5/5,5/10   Left lung 5 fractions at 10 Gy  . Wears glasses    Past Surgical History:  Procedure Laterality Date  . ABDOMINAL HYSTERECTOMY  1981 (APPROX)   BILATERAL SALPINGO-OOPHORECTOMY WITH EXCEPTION A SMALL PIECE OF OVARY REMAINS  . APPENDECTOMY    . CARDIAC CATHETERIZATION    . CHOLECYSTECTOMY    . COLONOSCOPY    . CORONARY ARTERY BYPASS GRAFT N/A 11/14/2012   Procedure: CORONARY ARTERY BYPASS GRAFTING (CABG) times three using left internal mammary artery and left saphenous vein;  Surgeon: Gaye Pollack, MD; L-LAD, S-OM, S-PDA   . ENDARTERECTOMY Right 11/14/2012   Procedure: RIGHT ENDARTERECTOMY CAROTID with patch angioplasty.;  Surgeon: Rosetta Posner, MD;  Location: [place];  Service: Vascular;  Laterality: Right;  . ENDARTERECTOMY Left 11/08/2015   Procedure: ENDARTERECTOMY CAROTID WITH PATCH ANGIOPLASTY;  Surgeon: Rosetta Posner, MD;  Location: [place];  Service: Vascular;  Laterality: Left;  . HAMMER TOE SURGERY Left   . HIATAL HERNIA REPAIR  1964  (AGE 68)   CHOLECYSTECTOMY AND APPENDECTOMY  . INTRAOPERATIVE TRANSESOPHAGEAL ECHOCARDIOGRAM N/A 11/14/2012   Procedure: INTRAOPERATIVE TRANSESOPHAGEAL ECHOCARDIOGRAM;  Surgeon: Gaye Pollack, MD;  Location: [facility];  Service: Open Heart Surgery;  Laterality: N/A;  . LEFT ELBOW SURGERY    . [place] SURGERY  1993  . TONSILLECTOMY   1959  . TONSILLECTOMY    . TRANSURETHRAL RESECTION OF BLADDER TUMOR N/A 06/24/2012   Procedure: TRANSURETHRAL RESECTION OF BLADDER TUMOR  WITH GYRUS AND INSTILLATION OF MITOMYCIN C  (TURBT);  Surgeon: Claybon Jabs, MD;  Location[facility];  Service: Urology;  Laterality: N/A;  . WRIST SURGERY Left     reports that she quit smoking about 4 years ago. Her smoking use included cigarettes. She has a 50.00 pack-year smoking history. She has never used smokeless tobacco. She reports that she does not drink alcohol or use drugs. family history includes Cancer in her father, mother, and other; Cervical cancer in her mother; Colon cancer in her father; Diabetes in her mother and other; Heart disease in her mother; Hypertension in her father, mother, and other; Kidney disease in her other; Lung cancer (age of onset: 38) in her father. Allergies  Allergen Reactions  . Ace Inhibitors Other (See Comments)    Hyperkalemia; pt denies    Current Outpatient Medications on File Prior to Visit  Medication Sig Dispense Refill  . aspirin 81 MG EC tablet Take 1 tablet (81 mg total) by mouth daily.    . Blood Glucose Monitoring Suppl (ONE TOUCH ULTRA 2) w/Device KIT Use to check blood sugars daily 1 each 0  . carvedilol (COREG) 12.5 MG tablet Take 1 & 1/2 tablets by mouth twice a day with a meal 270 tablet 3  . furosemide (LASIX) 20 MG tablet Take 1 tablet (20 mg total) by mouth daily. NEED OV. 90 tablet 3  . hydrALAZINE (APRESOLINE) 25 MG tablet Take 1 tablet (25 mg total) by mouth 3 (three) times daily. NEED OV. 270 tablet 3  . Hypromellose (ARTIFICIAL TEARS OP) Place 1 drop into both eyes 4 (four) times daily as needed (for dry eyes).    . isosorbide mononitrate (IMDUR) 30 MG 24 hr tablet Take 1 tablet (30 mg total) by mouth daily. 90 tablet 3  . Lancets (ONETOUCH ULTRASOFT) lancets 1 each by Other route 4 (four) times daily. Use to help check blood sugars four times a day Dx E11.9 400 each 3    . Multiple Vitamin (MULTIVITAMIN) tablet Take 1 tablet by mouth daily.    . Omega-3 Fatty Acids (FISH OIL) 1000 MG CAPS Take 1,000 mg by mouth daily.     . pravastatin (PRAVACHOL) 40 MG tablet Take 1 tablet (40 mg total) by mouth every evening. NEED OV. 90 tablet 3   No current facility-administered medications on file prior to visit.    Review of Systems Constitutional: Negative for other unusual diaphoresis, sweats, appetite or weight changes HENT: Negative for other worsening hearing loss, ear pain, facial swelling, mouth sores or neck stiffness.   Eyes: Negative for other worsening pain, redness or other visual disturbance.  Respiratory: Negative for other stridor or swelling Cardiovascular: Negative for other palpitations or other  chest pain  Gastrointestinal: Negative for worsening diarrhea or loose stools, blood in stool, distention or other pain Genitourinary: Negative for hematuria, flank pain or other change in urine volume.  Musculoskeletal: Negative for myalgias or other joint swelling.  Skin: Negative for other color change, or other wound or worsening drainage.  Neurological: Negative for other syncope or numbness. Hematological: Negative for other adenopathy or swelling Psychiatric/Behavioral: Negative for hallucinations, other worsening agitation, SI, self-injury, or new decreased concentration All other system neg per pt    Objective:   Physical Exam BP (!) 142/86   Pulse 78   Temp 97.6 F (36.4 C) (Oral)   Ht 5' 1"  (1.549 m)   Wt 92 lb (41.7 kg)   SpO2 94%   BMI 17.38 kg/m  VS noted, thin for ht Constitutional: Pt is oriented to person, place, and time. Appears well-developed and well-nourished, in no significant distress and comfortable Head: Normocephalic and atraumatic  Eyes: Conjunctivae and EOM are normal. Pupils are equal, round, and reactive to light Right Ear: External ear normal without discharge Left Ear: External ear normal without discharge Nose:  Nose without discharge or deformity Mouth/Throat: Oropharynx is without other ulcerations and moist  Neck: Normal range of motion. Neck supple. No JVD present. No tracheal deviation present or significant neck LA or mass Cardiovascular: Normal rate, regular rhythm, normal heart sounds and intact distal pulses.   Pulmonary/Chest: WOB normal and breath sounds without rales or wheezing  Abdominal: Soft. Bowel sounds are normal. NT. No HSM  Musculoskeletal: Normal range of motion. Exhibits no edema Lymphadenopathy: Has no other cervical adenopathy.  Neurological: Pt is alert and oriented to person, place, and time. Pt has normal reflexes. No cranial nerve deficit. Motor grossly intact, Gait intact Skin: Skin is warm and dry. No rash noted or new ulcerations Psychiatric:  Has somewhat irritable mood and affect. Behavior is normal without agitation No other exam findings  Lab Results  Component Value Date   WBC 11.6 (H) 05/06/2016   HGB 11.4 (L) 05/06/2016   HCT 34.3 (L) 05/06/2016   PLT 247.0 05/06/2016   GLUCOSE 124 (H) 05/06/2016   CHOL 123 05/06/2016   TRIG 109.0 05/06/2016   HDL 52.90 05/06/2016   LDLDIRECT 127.6 10/07/2011   LDLCALC 48 05/06/2016   ALT 10 05/06/2016   AST 21 05/06/2016   NA 140 05/06/2016   K 3.6 05/06/2016   CL 104 05/06/2016   CREATININE 0.80 05/06/2016   BUN 16 05/06/2016   CO2 29 05/06/2016   TSH 5.80 (H) 05/06/2016   INR 1.05 11/06/2015   HGBA1C 6.8 (H) 05/06/2016   MICROALBUR 35.9 (H) 05/06/2016        Assessment & Plan:

## 2017-07-15 NOTE — Assessment & Plan Note (Signed)

## 2017-07-15 NOTE — Telephone Encounter (Signed)
Pt has been informed and expressed understanding.  

## 2017-07-21 ENCOUNTER — Ambulatory Visit: Payer: PPO

## 2017-07-22 ENCOUNTER — Ambulatory Visit: Payer: PPO

## 2017-07-28 ENCOUNTER — Ambulatory Visit (HOSPITAL_BASED_OUTPATIENT_CLINIC_OR_DEPARTMENT_OTHER): Payer: PPO

## 2017-07-28 ENCOUNTER — Other Ambulatory Visit: Payer: Self-pay

## 2017-07-28 ENCOUNTER — Ambulatory Visit (HOSPITAL_COMMUNITY)
Admission: RE | Admit: 2017-07-28 | Discharge: 2017-07-28 | Disposition: A | Discharge status: Home / Self Care | Payer: PPO | Source: Ambulatory Visit | Attending: Cardiology | Admitting: Cardiology

## 2017-07-28 DIAGNOSIS — E119 Type 2 diabetes mellitus without complications: Secondary | ICD-10-CM | POA: Diagnosis not present

## 2017-07-28 DIAGNOSIS — I6523 Occlusion and stenosis of bilateral carotid arteries: Secondary | ICD-10-CM

## 2017-07-28 DIAGNOSIS — I350 Nonrheumatic aortic (valve) stenosis: Secondary | ICD-10-CM

## 2017-07-28 DIAGNOSIS — I509 Heart failure, unspecified: Secondary | ICD-10-CM | POA: Diagnosis not present

## 2017-07-28 DIAGNOSIS — I110 Hypertensive heart disease with heart failure: Secondary | ICD-10-CM | POA: Diagnosis not present

## 2017-07-28 DIAGNOSIS — E785 Hyperlipidemia, unspecified: Secondary | ICD-10-CM | POA: Diagnosis not present

## 2017-07-28 DIAGNOSIS — I2510 Atherosclerotic heart disease of native coronary artery without angina pectoris: Secondary | ICD-10-CM | POA: Diagnosis not present

## 2017-08-16 ENCOUNTER — Encounter: Payer: Self-pay | Admitting: Gastroenterology

## 2017-08-17 NOTE — Progress Notes (Signed)
Cardiology Office Note   Date:  08/18/2017   ID:  Tonya Valdez, DOB 12-14-1940, MRN [id number]  PCP:  Biagio Borg, MD  Cardiologist: Dr. Stanford Breed No chief complaint on file.    History of Present Illness: Tonya Valdez is a 77 y.o. female who presents for ongoing assessment and management of coronary artery disease with recent catheterization completed in 2014 revealing ostial left main 60%, ostial LAD 40 to 50%, mid LAD 70 to 80%, proximal circumflex was found to be subsequently had CABG in 2014 with LIMA to LAD, SVG to OM, SVG to PDA, and right carotid endarterectomy.  Other history includes PAD with abdominal aorta diffusely disease with severe ulcerated plaquing in the right iliac and ostial area at 95%, left renal long 60 to 70% right renal normal.  She was last seen in the office by myself on 07/15/2017 at which time she was complaining of shortness of breath, had been recently treated for bladder cancer and lung cancer with small lung nodule which was treated with chemotherapy and radiation.  Unfortunately, the patient continued to smoke.  Due to loud aortic valve murmur, the patient was scheduled for echocardiogram for further evaluation.  I also repeated carotid Doppler ultrasounds due to significant bilateral carotid bruits.  Blood pressure was not well controlled, and the patient requested change in her medications.  I decided to wait to do that until all other testing was completed so that we can adjust the medications appropriately.  Echocardiogram revealed normal LV systolic function, mild regurgitation of the mitral valve, aortic valve is moderately thickened with moderately calcified leaflets, sclerosis without stenosis.  Carotid ultrasound was negative for hemodynamically significant plaque on the left, but plaque greater than 50% on the right CCA.  She was noted to have right subclavian artery flow disturbed.  There was minimal plaque at the site of the prior CEA.  She is  without complaints. Tolerating her medications well. She wants allergies to be corrected to remove ACE allergy as she has not had any complications from this class of medications.   Past Medical History:  Diagnosis Date  . Bilateral carotid artery disease ([place]) 09/26/2012   carotid US (8/14):  R 80-99; L 60-79 => s/p R CEA 10/2012 (Dr. Donnetta Hutching)  . Bladder cancer ([place]) 04/26/2013  . Bladder tumor   . CAD (coronary artery disease)    a. LHC (10/07/12):  Ostial LM 60%, distal LM 40-50%, oLAD 40-50%, mLAD 70-80%, pCFX subtotally occluded, mCFX 80-90%, oRCA occluded, ostial L subclavian 40%, EF 25-30%, abdominal aorta diffusely diseased with severe ulcerated plaquing (right iliac ostial 95%; left renal long 60-70%, right okay). CO 2.1, CI 1.5 => s/p CABG (L-LAD, S-OM, S-PDA) 10/2012 with Dr. Cyndia Bent   . Cancer ([place])    bladder  . Cataract    left eye  . Chronic systolic CHF (congestive heart failure) ([place]) 08/04/2012   Echo (08/17/12): EF 30%, diffuse HK, mild MR, mild BAE, PASP.;  Echo (01/2013): EF 40-45%, inferior posterior HK, Gr 2 DD, mild MR, mild LAE  . COPD (chronic obstructive pulmonary disease) ([place])   . 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 H/O hiatal hernia    s/p  77 yrs old  . Hyperkalemia    ACEI d/c'd 11/2012  . Hyperlipidemia   . Hypertension    controlled with medications  . Ischemic cardiomyopathy   . Lung  cancer ([place])   . Lung nodule    chest CT 09/2012 => needs repeat by 03/2013  . PAD (peripheral artery disease) ([place])   . 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 Radiation 4/26,4/28,5/3,5/5,5/10   Left lung 5 fractions at 10 Gy  . Wears glasses     Past Surgical History:  Procedure Laterality Date  . ABDOMINAL HYSTERECTOMY  1981 (APPROX)   BILATERAL SALPINGO-OOPHORECTOMY WITH EXCEPTION A SMALL PIECE OF OVARY REMAINS  . APPENDECTOMY    .  CARDIAC CATHETERIZATION    . CHOLECYSTECTOMY    . COLONOSCOPY    . CORONARY ARTERY BYPASS GRAFT N/A 11/14/2012   Procedure: CORONARY ARTERY BYPASS GRAFTING (CABG) times three using left internal mammary artery and left saphenous vein;  Surgeon: Gaye Pollack, MD; L-LAD, S-OM, S-PDA   . ENDARTERECTOMY Right 11/14/2012   Procedure: RIGHT ENDARTERECTOMY CAROTID with patch angioplasty.;  Surgeon: Rosetta Posner, MD;  Location: [place] [facility];  Service: Vascular;  Laterality: Right;  . ENDARTERECTOMY Left 11/08/2015   Procedure: ENDARTERECTOMY CAROTID WITH PATCH ANGIOPLASTY;  Surgeon: Rosetta Posner, MD;  Location: [facility];  Service: Vascular;  Laterality: Left;  . HAMMER TOE SURGERY Left   . HIATAL HERNIA REPAIR  1964  (AGE 77)   CHOLECYSTECTOMY AND APPENDECTOMY  . INTRAOPERATIVE TRANSESOPHAGEAL ECHOCARDIOGRAM N/A 11/14/2012   Procedure: INTRAOPERATIVE TRANSESOPHAGEAL ECHOCARDIOGRAM;  Surgeon: Gaye Pollack, MD;  Location: [facility] OR;  Service: Open Heart Surgery;  Laterality: N/A;  . LEFT ELBOW SURGERY    . [place] SURGERY  1993  . TONSILLECTOMY  1959  . TONSILLECTOMY    . TRANSURETHRAL RESECTION OF BLADDER TUMOR N/A 06/24/2012   Procedure: TRANSURETHRAL RESECTION OF BLADDER TUMOR  WITH GYRUS AND INSTILLATION OF MITOMYCIN C  (TURBT);  Surgeon: Claybon Jabs, MD;  Location: [facility];  Service: Urology;  Laterality: N/A;  . WRIST SURGERY Left      Current Outpatient Medications  Medication Sig Dispense Refill  . aspirin 325 MG tablet Take 325 mg by mouth daily.    . Blood Glucose Monitoring Suppl (ONE TOUCH ULTRA 2) w/Device KIT Use to check blood sugars daily 1 each 0  . carvedilol (COREG) 12.5 MG tablet Take 1 & 1/2 tablets by mouth twice a day with a meal 270 tablet 3  . ferrous sulfate (SLOW IRON) 160 (50 Fe) MG TBCR SR tablet Take 1 tablet (160 mg total) by mouth daily. 30 each 5  . furosemide (LASIX) 20 MG tablet Take 1 tablet (20 mg total) by mouth daily. NEED OV. 90 tablet 3  .  glucose blood (ONE TOUCH ULTRA TEST) test strip 1 each by Other route 4 (four) times daily. Use to check blood sugar four times a day Dx E11.9 400 each 3  . hydrALAZINE (APRESOLINE) 25 MG tablet Take 1 tablet (25 mg total) by mouth 3 (three) times daily. NEED OV. 270 tablet 3  . Hypromellose (ARTIFICIAL TEARS OP) Place 1 drop into both eyes 4 (four) times daily as needed (for dry eyes).    . isosorbide mononitrate (IMDUR) 30 MG 24 hr tablet Take 1 tablet (30 mg total) by mouth daily. 90 tablet 3  . Lancets (ONETOUCH ULTRASOFT) lancets 1 each by Other route 4 (four) times daily. Use to help check blood sugars four times a day Dx E11.9 400 each 3  . metFORMIN (GLUCOPHAGE) 500 MG tablet Take 1 tablet by mouth twice  a day with a meal 180 tablet 3  . Multiple Vitamin (MULTIVITAMIN) tablet Take 1 tablet by mouth daily.    . Omega-3 Fatty Acids (FISH OIL) 1000 MG CAPS Take 1,000 mg by mouth daily.     . pravastatin (PRAVACHOL) 40 MG tablet Take 1 tablet (40 mg total) by mouth every evening. NEED OV. 90 tablet 3  . Vitamin D, Ergocalciferol, (DRISDOL) 50000 units CAPS capsule Take 1 capsule (50,000 Units total) by mouth every 7 (seven) days. 12 capsule 0   No current facility-administered medications for this visit.     Allergies:   Patient has no active allergies.    Social History:  The patient  reports that she quit smoking about 4 years ago. Her smoking use included cigarettes. She has a 50.00 pack-year smoking history. She has never used smokeless tobacco. She reports that she does not drink alcohol or use drugs.   Family History:  The patient's family history includes Cancer in her father, mother, and other; Cervical cancer in her mother; Colon cancer in her father; Diabetes in her mother and other; Heart disease in her mother; Hypertension in her father, mother, and other; Kidney disease in her other; Lung cancer (age of onset: 29) in her father.    ROS: All other systems are reviewed and  negative. Unless otherwise mentioned in H&P    PHYSICAL EXAM: VS:  BP (!) 148/61   Pulse 62   Ht 5' 1"  (1.549 m)   Wt 89 lb (40.4 kg)   BMI 16.82 kg/m  , BMI Body mass index is 16.82 kg/m. GEN: Well nourished, well developed, in no acute distress Thin HEENT: normal  Neck: no JVD, bilateral carotid bruits, R>L, or masses Cardiac: RRR 2/6 holosystolic murmur, no murmurs, rubs, or gallops,no edema  Respiratory:  clear to auscultation bilaterally, normal work of breathing GI: soft, nontender, nondistended, + BS MS: no deformity or atrophy  Skin: warm and dry, no rash Neuro:  Strength and sensation are intact Psych: euthymic mood, full affect   EKG:  Not competed during this office visit.   Recent Labs: 07/15/2017: ALT 11; BUN 12; Creatinine, Ser 0.79; Hemoglobin 11.7; Platelets 378.0; Potassium 4.6; Sodium 139; TSH 3.11    Lipid Panel    Component Value Date/Time   CHOL 131 07/15/2017 1007   TRIG 93.0 07/15/2017 1007   HDL 58.40 07/15/2017 1007   CHOLHDL 2 07/15/2017 1007   VLDL 18.6 07/15/2017 1007   LDLCALC 54 07/15/2017 1007   LDLDIRECT 127.6 10/07/2011 1037      Wt Readings from Last 3 Encounters:  08/18/17 89 lb (40.4 kg)  07/15/17 92 lb (41.7 kg)  07/15/17 92 lb 12.8 oz (42.1 kg)      Other studies Reviewed: Echocardiogram 08/21/17 Left ventricle: Inferior basal hypokinesis. The cavity size was   normal. Wall thickness was increased in a pattern of mild LVH.   The estimated ejection fraction was 55%. Doppler parameters are   consistent with both elevated ventricular end-diastolic filling   pressure and elevated left atrial filling pressure. - Mitral valve: There was mild regurgitation. - Left atrium: The atrium was mildly dilated. - Atrial septum: No defect or patent foramen ovale was identified.  ASSESSMENT AND PLAN:  1. CAD: Hx of CABG in 2014 as above. She is doing well and has no complaints of recurrent chest pain, fatigue, or dyspnea. She is  tolerating her medications and is compliant with this. ASA is now reduced to 81 mg daily. No  further testing is planned.   2. Hypertension: She denies dizziness. She states she is not allergic to ACE inhibitors and therefore this will be removed from her allergy list. She has some feelings of being cold after taking hydralazine for a short period time. She states she can tolerate this and does not wish to change her medications at this time. I have offered to change to amlodipine or consider lisinopril. She wishes to wait for now.   3. Hypercholesterolemia: Continue pravastatin as directed. Follow up labs on next office visit.   4. PAD:  Aortic and iliac disease is noted on testing . She is asymptomatic at present. Consider referring to [facility] if she is symptomatic.    Current medicines are reviewed at length with the patient today.    Labs/ tests ordered today include: None   Phill Myron. West Pugh, ANP, AACC   08/18/2017 11:53 AM    [facility] [address], [place], [place] [address] Phone: [phone]; Fax: [phone]

## 2017-08-18 ENCOUNTER — Ambulatory Visit: Payer: PPO | Admitting: Adult Health

## 2017-08-18 ENCOUNTER — Encounter: Payer: Self-pay | Admitting: Adult Health

## 2017-08-18 VITALS — BP 148/61 | HR 62 | Ht 61.0 in | Wt 89.0 lb

## 2017-08-18 DIAGNOSIS — E7800 Pure hypercholesterolemia, unspecified: Secondary | ICD-10-CM | POA: Diagnosis not present

## 2017-08-18 DIAGNOSIS — I739 Peripheral vascular disease, unspecified: Secondary | ICD-10-CM

## 2017-08-18 DIAGNOSIS — I2510 Atherosclerotic heart disease of native coronary artery without angina pectoris: Secondary | ICD-10-CM | POA: Diagnosis not present

## 2017-08-18 DIAGNOSIS — I10 Essential (primary) hypertension: Secondary | ICD-10-CM | POA: Diagnosis not present

## 2017-08-18 NOTE — Patient Instructions (Signed)
Medication Instructions:  NO CHANGES- Your physician recommends that you continue on your current medications as directed. Please refer to the Current Medication list given to you today.  If you need a refill on your cardiac medications before your next appointment, please call your pharmacy.  Follow-Up: Your physician wants you to follow-up in: [place] ([place]), DNP,AACC IF PRIMARY CARDIOLOGIST IS UNAVAILABLE.  You should receive a reminder letter in the mail two months in advance. If you do not receive a letter, please call our office [place] 2019 to schedule the JAN 2020 follow-up appointment.   Thank you for choosing [facility] at [facility]!!

## 2017-08-19 ENCOUNTER — Ambulatory Visit (INDEPENDENT_AMBULATORY_CARE_PROVIDER_SITE_OTHER): Payer: PPO | Admitting: Gastroenterology

## 2017-08-19 ENCOUNTER — Encounter: Payer: Self-pay | Admitting: Gastroenterology

## 2017-08-19 VITALS — BP 120/52 | HR 68 | Ht 62.0 in | Wt 92.4 lb

## 2017-08-19 DIAGNOSIS — D508 Other iron deficiency anemias: Secondary | ICD-10-CM

## 2017-08-19 MED ORDER — NA SULFATE-K SULFATE-MG SULF 17.5-3.13-1.6 GM/177ML PO SOLN: 1.0000 | Freq: Once | ORAL | 0 refills | Status: AC

## 2017-08-19 NOTE — Patient Instructions (Addendum)
You have been scheduled for an endoscopy and colonoscopy. Please follow the written instructions given to you at your visit today. Please pick up your prep supplies at the pharmacy within the next 1-3 days. If you use inhalers (even only as needed), please bring them with you on the day of your procedure.  If you are age 78 or older, your body mass index should be between 23-30. Your Body mass index is 16.9 kg/m. If this is out of the aforementioned range listed, please consider follow up with your Primary Care Provider.  If you are age 58 or younger, your body mass index should be between 19-25. Your Body mass index is 16.9 kg/m. If this is out of the aformentioned range listed, please consider follow up with your Primary Care Provider.

## 2017-08-19 NOTE — Progress Notes (Signed)
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 Patient denies any overt GI bleed, no melena or blood per rectum. Denies any nausea, vomiting, dysphagia, abdominal pain, change in bowel habits, loss of appetite or weight loss. Iron saturation 7.7 % with iron level 24 and transferrin 223.  B12 350.  Hemoglobin 11.7 and hematocrit 34  Colonoscopy by Dr. Sharlett Iles January 01, 2012 2 large pedunculated polyps were removed from descending and sigmoid colon Tubular adenomas on pathology  Outpatient Encounter Medications as of 08/19/2017  Medication Sig  . Blood Glucose Monitoring Suppl (ONE TOUCH ULTRA 2) w/Device KIT Use to check blood sugars daily  . carvedilol (COREG) 12.5 MG tablet Take 1 & 1/2 tablets by mouth twice a day with a meal  . ferrous sulfate (SLOW IRON) 160 (50 Fe) MG TBCR SR tablet Take 1 tablet (160 mg total) by mouth daily.  . furosemide (LASIX) 20 MG tablet Take 1 tablet (20 mg total) by mouth daily. NEED OV.  [name] glucose blood (ONE TOUCH ULTRA TEST) test strip 1 each by Other route 4 (four) times daily. Use to check blood sugar four times a day Dx E11.9  . hydrALAZINE (APRESOLINE) 25 MG tablet Take 1 tablet (25 mg total) by mouth 3 (three) times daily. NEED OV.  [name] Hypromellose (ARTIFICIAL TEARS OP) Place 1 drop into both eyes 4 (four) times daily as needed (for dry eyes).  . isosorbide mononitrate (IMDUR) 30 MG 24 hr tablet Take 1 tablet (30 mg total) by mouth daily.  . Lancets (ONETOUCH ULTRASOFT) lancets 1 each by Other route 4 (four) times daily. Use to help check blood sugars four times a day Dx E11.9  . metFORMIN (GLUCOPHAGE) 500 MG tablet Take 1 tablet by mouth twice a day with a meal  .  Multiple Vitamin (MULTIVITAMIN) tablet Take 1 tablet by mouth daily.  . Omega-3 Fatty Acids (FISH OIL) 1000 MG CAPS Take 1,000 mg by mouth daily.   . pravastatin (PRAVACHOL) 40 MG tablet Take 1 tablet (40 mg total) by mouth every evening. NEED OV.  . Vitamin D, Ergocalciferol, (DRISDOL) 50000 units CAPS capsule Take 1 capsule (50,000 Units total) by mouth every 7 (seven) days.  . [DISCONTINUED] aspirin 325 MG tablet Take 325 mg by mouth daily.   No facility-administered encounter medications on file as of 08/19/2017.     Allergies as of 08/19/2017  . (No Known Allergies)    Past Medical History:  Diagnosis Date  . Bilateral carotid artery disease ([name]) 09/26/2012   carotid US (8/14):  R 80-99; L 60-79 => s/p R CEA 10/2012 (Dr. Donnetta Hutching)  . Bladder cancer ([name]) 04/26/2013  . Bladder tumor   . CAD (coronary artery disease)    a. LHC (10/07/12):  Ostial LM 60%, distal LM 40-50%, oLAD 40-50%, mLAD 70-80%, pCFX subtotally occluded, mCFX 80-90%, oRCA occluded, ostial L subclavian 40%, EF 25-30%, abdominal aorta diffusely diseased with severe ulcerated plaquing (right iliac ostial 95%; left renal long 60-70%, right okay). CO 2.1, CI 1.5 => s/p CABG (L-LAD, S-OM, S-PDA) 10/2012 with Dr. Cyndia Bent   . Cancer ([place])    bladder  . Cataract    left eye  .  Chronic systolic CHF (congestive heart failure) ([name]) 08/04/2012   Echo (08/17/12): EF 30%, diffuse HK, mild MR, mild BAE, PASP.;  Echo (01/2013): EF 40-45%, inferior posterior HK, Gr 2 DD, mild MR, mild LAE  . COPD (chronic obstructive pulmonary disease) ([place])   . 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 H/O hiatal hernia    s/p  77 yrs old  . Hyperkalemia    ACEI d/c'd 11/2012  . Hyperlipidemia   . Hypertension    controlled with medications  . Ischemic cardiomyopathy   . Lung cancer ([name])   . Lung nodule    chest CT 09/2012 => needs repeat by 03/2013  . PAD  (peripheral artery disease) ([name])   . 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 Radiation 4/26,4/28,5/3,5/5,5/10   Left lung 5 fractions at 10 Gy  . Wears glasses     Past Surgical History:  Procedure Laterality Date  . ABDOMINAL HYSTERECTOMY  1981 (APPROX)   BILATERAL SALPINGO-OOPHORECTOMY WITH EXCEPTION A SMALL PIECE OF OVARY REMAINS  . APPENDECTOMY    . CARDIAC CATHETERIZATION    . CHOLECYSTECTOMY    . COLONOSCOPY    . CORONARY ARTERY BYPASS GRAFT N/A 11/14/2012   Procedure: CORONARY ARTERY BYPASS GRAFTING (CABG) times three using left internal mammary artery and left saphenous vein;  Surgeon: Gaye Pollack, MD; L-LAD, S-OM, S-PDA   . ENDARTERECTOMY Right 11/14/2012   Procedure: RIGHT ENDARTERECTOMY CAROTID with patch angioplasty.;  Surgeon: Rosetta Posner, MD;  Location: [place];  Service: Vascular;  Laterality: Right;  . ENDARTERECTOMY Left 11/08/2015   Procedure: ENDARTERECTOMY CAROTID WITH PATCH ANGIOPLASTY;  Surgeon: Rosetta Posner, MD;  Location: [place];  Service: Vascular;  Laterality: Left;  . HAMMER TOE SURGERY Left   . HIATAL HERNIA REPAIR  1964  (AGE 61)   CHOLECYSTECTOMY AND APPENDECTOMY  . INTRAOPERATIVE TRANSESOPHAGEAL ECHOCARDIOGRAM N/A 11/14/2012   Procedure: INTRAOPERATIVE TRANSESOPHAGEAL ECHOCARDIOGRAM;  Surgeon: Gaye Pollack, MD;  Location: [facility] OR;  Service: Open Heart Surgery;  Laterality: N/A;  . LEFT ELBOW SURGERY    . [place] SURGERY  1993  . TONSILLECTOMY  1959  . TONSILLECTOMY    . TRANSURETHRAL RESECTION OF BLADDER TUMOR N/A 06/24/2012   Procedure: TRANSURETHRAL RESECTION OF BLADDER TUMOR  WITH GYRUS AND INSTILLATION OF MITOMYCIN C  (TURBT);  Surgeon: Claybon Jabs, MD;  [facility];  Service: Urology;  Laterality: N/A;  . WRIST SURGERY Left     Family History  Problem Relation Age of Onset  . Heart disease Mother        MI at age 43  . Cervical cancer Mother   . Cancer Mother   .  Diabetes Mother   . Hypertension Mother   . Lung cancer Father 34       primary site lung CA then colon and bone  . Colon cancer Father   . Cancer Father   . Hypertension Father   . Cancer Other        colon cancer  . Hypertension Other   . Kidney disease Other   . Diabetes Other     Social History   Socioeconomic History  . Marital status: Widowed    Spouse name: Not on file  . Number of children: 2  . Years of education: 41  .  Highest education level: Not on file  Occupational History  . Occupation: Therapist, sports, Multimedia programmer: [employer]  . Financial resource strain: Not on file  . Food insecurity:    Worry: Not on file    Inability: Not on file  . Transportation needs:    Medical: Not on file    Non-medical: Not on file  Tobacco Use  . Smoking status: Former Smoker    Packs/day: 1.00    Years: 50.00    Pack years: 50.00    Types: Cigarettes    Last attempt to quit: 04/23/2013    Years since quitting: 4.3  . Smokeless tobacco: Never Used  Substance and Sexual Activity  . Alcohol use: No    Alcohol/week: 0.0 oz    Comment: occasional wine not often  . Drug use: No  . Sexual activity: Not Currently  Lifestyle  . Physical activity:    Days per week: Not on file    Minutes per session: Not on file  . Stress: Not on file  Relationships  . Social connections:    Talks on phone: Not on file    Gets together: Not on file    Attends religious service: Not on file    Active member of club or organization: Not on file    Attends meetings of clubs or organizations: Not on file    Relationship status: Not on file  . Intimate partner violence:    Fear of current or ex partner: Not on file    Emotionally abused: Not on file    Physically abused: Not on file    Forced sexual activity: Not on file  Other Topics Concern  . Not on file  Social History Narrative  . Not on file      Review of systems: Review of Systems  Constitutional: Negative for  fever and chills.  HENT: Negative.   Eyes: Negative for blurred vision.  Respiratory: Negative for cough, shortness of breath and wheezing.   Cardiovascular: Negative for chest pain and palpitations.  Gastrointestinal: as per HPI Genitourinary: Negative for dysuria, urgency, frequency and hematuria.  Musculoskeletal: Negative for myalgias, back pain and joint pain.  Skin: Negative for itching and rash.  Neurological: Negative for dizziness, tremors, focal weakness, seizures and loss of consciousness.  Endo/Heme/Allergies: Positive for seasonal allergies.  Psychiatric/Behavioral: Negative for depression, suicidal ideas and hallucinations.  All other systems reviewed and are negative.   Physical Exam: Vitals:   08/19/17 1024  BP: (!) 120/52  Pulse: 68   Body mass index is 16.9 kg/m. Gen:      No acute distress HEENT:  EOMI, sclera anicteric Neck:     No masses; no thyromegaly Lungs:    Clear to auscultation bilaterally; normal respiratory effort CV:         Regular rate and rhythm; no murmurs Abd:      + bowel sounds; soft, non-tender; no palpable masses, no distension Ext:    No edema; adequate peripheral perfusion Skin:      Warm and dry; no rash Neuro: alert and oriented x 3 Psych: normal mood and affect  Data Reviewed:  Reviewed labs, radiology imaging, old records and pertinent past GI work up   Assessment and Plan/Recommendations:  77 year old female, retired Therapist, sports with history of bladder cancer, CAD, CHF (EF 55%), 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  Due for surveillance colonoscopy. We will schedule for EGD and colonoscopy for evaluation of iron deficiency anemia If no significant source on EGD or colonoscopy, will need to consider small bowel video capsule to evaluate for possible small bowel occult blood loss. Continue oral iron  supplements Recheck hemoglobin and ferritin in 3 months to see if has adequate iron stores, if not may need iron infusion.  The risks and benefits as well as alternatives of endoscopic procedure(s) have been discussed and reviewed. All questions answered. The patient agrees to proceed.    Damaris Hippo , MD [phone]    CC: Tonya Borg, MD

## 2017-09-28 ENCOUNTER — Ambulatory Visit (AMBULATORY_SURGERY_CENTER): Payer: PPO | Admitting: Gastroenterology

## 2017-09-28 ENCOUNTER — Encounter: Payer: Self-pay | Admitting: Gastroenterology

## 2017-09-28 VITALS — BP 178/66 | HR 77 | Temp 99.3°F | Resp 14 | Ht 62.0 in | Wt 92.0 lb

## 2017-09-28 DIAGNOSIS — I10 Essential (primary) hypertension: Secondary | ICD-10-CM | POA: Diagnosis not present

## 2017-09-28 DIAGNOSIS — K2970 Gastritis, unspecified, without bleeding: Secondary | ICD-10-CM

## 2017-09-28 DIAGNOSIS — J449 Chronic obstructive pulmonary disease, unspecified: Secondary | ICD-10-CM | POA: Diagnosis not present

## 2017-09-28 DIAGNOSIS — D508 Other iron deficiency anemias: Secondary | ICD-10-CM | POA: Diagnosis not present

## 2017-09-28 DIAGNOSIS — I509 Heart failure, unspecified: Secondary | ICD-10-CM | POA: Diagnosis not present

## 2017-09-28 DIAGNOSIS — Z86010 Personal history of colonic polyps: Secondary | ICD-10-CM

## 2017-09-28 DIAGNOSIS — D123 Benign neoplasm of transverse colon: Secondary | ICD-10-CM | POA: Diagnosis not present

## 2017-09-28 DIAGNOSIS — K2990 Gastroduodenitis, unspecified, without bleeding: Secondary | ICD-10-CM | POA: Diagnosis not present

## 2017-09-28 DIAGNOSIS — D122 Benign neoplasm of ascending colon: Secondary | ICD-10-CM | POA: Diagnosis not present

## 2017-09-28 DIAGNOSIS — I2510 Atherosclerotic heart disease of native coronary artery without angina pectoris: Secondary | ICD-10-CM | POA: Diagnosis not present

## 2017-09-28 DIAGNOSIS — K2950 Unspecified chronic gastritis without bleeding: Secondary | ICD-10-CM | POA: Diagnosis not present

## 2017-09-28 DIAGNOSIS — E119 Type 2 diabetes mellitus without complications: Secondary | ICD-10-CM | POA: Diagnosis not present

## 2017-09-28 DIAGNOSIS — K2980 Duodenitis without bleeding: Secondary | ICD-10-CM | POA: Diagnosis not present

## 2017-09-28 DIAGNOSIS — B9681 Helicobacter pylori [H. pylori] as the cause of diseases classified elsewhere: Secondary | ICD-10-CM | POA: Diagnosis not present

## 2017-09-28 DIAGNOSIS — D509 Iron deficiency anemia, unspecified: Secondary | ICD-10-CM | POA: Diagnosis not present

## 2017-09-28 MED ORDER — SODIUM CHLORIDE 0.9 % IV SOLN: 500.0000 mL | Freq: Once | INTRAVENOUS | Status: DC

## 2017-09-28 NOTE — Op Note (Signed)
[place] Patient Name: Tonya Valdez Procedure Date: 09/28/2017 3:23 PM MRN: [id number] Endoscopist: Mauri Pole , MD Age: 77 Referring MD:  Date of Birth: Jan 08, 1941 Gender: Female Account #: [id number] Procedure:                Upper GI endoscopy Indications:              Suspected upper gastrointestinal bleeding in                            patient with unexplained iron deficiency anemia Medicines:                Monitored Anesthesia Care Procedure:                Pre-Anesthesia Assessment:                           - Prior to the procedure, a History and Physical                            was performed, and patient medications and                            allergies were reviewed. The patient's tolerance of                            previous anesthesia was also reviewed. The risks                            and benefits of the procedure and the sedation                            options and risks were discussed with the patient.                            All questions were answered, and informed consent                            was obtained. Prior Anticoagulants: The patient has                            taken no previous anticoagulant or antiplatelet                            agents. ASA Grade Assessment: III - A patient with                            severe systemic disease. After reviewing the risks                            and benefits, the patient was deemed in                            satisfactory condition to undergo the procedure.  After obtaining informed consent, the endoscope was                            passed under direct vision. Throughout the                            procedure, the patient's blood pressure, pulse, and                            oxygen saturations were monitored continuously. The                            Endoscope was introduced through the mouth, and                            advanced  to the second part of duodenum. The upper                            GI endoscopy was accomplished without difficulty.                            The patient tolerated the procedure well. Scope In: Scope Out: Findings:                 The Z-line was regular and was found 38 cm from the                            incisors.                           The examined esophagus was normal.                           Scattered severe inflammation with hemorrhage                            characterized by adherent blood, erosions,                            erythema, mucus and shallow ulcerations was found                            in the entire examined stomach. Biopsies were taken                            with a cold forceps for Helicobacter pylori testing.                           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 Biopsies were taken with a cold                            forceps for histology. Complications:            No immediate complications. Estimated Blood Loss:     Estimated blood loss was minimal. Impression:               - Z-line regular, 38 cm from the incisors.                           - Normal esophagus.                           - Gastritis with hemorrhage. Biopsied.                           - Duodenal glycogenic acanthosis. Biopsied. Recommendation:           - Resume previous diet.                           - Continue present medications.                           - Await pathology results.                           - Repeat upper endoscopy for surveillance based on                            pathology results. Mauri Pole, MD 09/28/2017 4:25:21 PM This report has been signed electronically.

## 2017-09-28 NOTE — Op Note (Signed)
[name] Procedure Date: 09/28/2017 3:23 PM MRN: [id number] Endoscopist: Mauri Pole , MD Age: 77 Referring MD:  Date of Birth: October 08, 1940 Gender: Female Account #: [id number] Procedure:                Colonoscopy Indications:              Unexplained iron deficiency anemia Medicines:                Monitored Anesthesia Care Procedure:                Pre-Anesthesia Assessment:                           - Prior to the procedure, a History and Physical                            was performed, and patient medications and                            allergies were reviewed. The patient's tolerance of                            previous anesthesia was also reviewed. The risks                            and benefits of the procedure and the sedation                            options and risks were discussed with the patient.                            All questions were answered, and informed consent                            was obtained. Prior Anticoagulants: The patient has                            taken no previous anticoagulant or antiplatelet                            agents. ASA Grade Assessment: III - A patient with                            severe systemic disease. After reviewing the risks                            and benefits, the patient was deemed in                            satisfactory condition to undergo the procedure.                           After obtaining informed consent, the colonoscope  was passed under direct vision. Throughout the                            procedure, the patient's blood pressure, pulse, and                            oxygen saturations were monitored continuously. The                            Colonoscope was introduced through the anus and                            advanced to the the cecum, identified by                            appendiceal orifice and  ileocecal valve. The                            colonoscopy was somewhat difficult due to                            restricted mobility of the colon and a tortuous                            colon. The patient tolerated the procedure well.                            The quality of the bowel preparation was fair. The                            ileocecal valve, appendiceal orifice, and rectum                            were photographed. Scope In: 3:39:47 PM Scope Out: 4:21:09 PM Scope Withdrawal Time: 0 hours 26 minutes 33 seconds  Total Procedure Duration: 0 hours 41 minutes 22 seconds  Findings:                 The perianal and digital rectal examinations were                            normal.                           Three carpet-like polyps were found in the hepatic                            flexure and ascending colon. The polyps were 10 to                            20 mm in size. These polyps were removed with a                            piecemeal technique using a hot snare. Resection  and retrieval were complete.                           Two sessile polyps were found in the transverse                            colon and ascending colon. The polyps were 10 to 12                            mm in size. These polyps were removed with a hot                            snare. Resection and retrieval were complete.                           Two sessile polyps were found in the transverse                            colon and ascending colon. The polyps were 7 to 8                            mm in size. These polyps were removed with a cold                            snare. Resection and retrieval were complete.                           A 2 mm polyp was found in the ascending colon. The                            polyp was sessile. The polyp was removed with a                            cold biopsy forceps. Resection and retrieval were                             complete.                           Multiple carpet-like and sessile polyps were found                            in the rectum, sigmoid colon, descending colon and                            transverse colon. The polyps were 5 to 20 mm in                            size. Polyps were not removed Complications:            No immediate complications. Estimated Blood Loss:     Estimated blood loss was minimal. Impression:               -  Preparation of the colon was fair.                           - Three 10 to 20 mm polyps at the hepatic flexure                            and in the ascending colon, removed piecemeal using                            a hot snare. Resected and retrieved.                           - Two 10 to 12 mm polyps in the transverse colon                            and in the ascending colon, removed with a hot                            snare. Resected and retrieved.                           - Two 7 to 8 mm polyps in the transverse colon and                            in the ascending colon, removed with a cold snare.                            Resected and retrieved.                           - One 2 mm polyp in the ascending colon, removed                            with a cold biopsy forceps. Resected and retrieved.                           - Multiple 10 to 20 mm polyps in the rectum, in the                            sigmoid colon, in the descending colon and in the                            transverse colon. Recommendation:           - Patient has a contact number available for                            emergencies. The signs and symptoms of potential                            delayed complications were discussed with the  patient. Return to normal activities tomorrow.                            Written discharge instructions were provided to the                            patient.                           - Resume previous  diet.                           - Continue present medications.                           - No aspirin, ibuprofen, naproxen, or other                            non-steroidal anti-inflammatory drugs.                           - Await pathology results.                           - Repeat colonoscopy at appointment to be scheduled                            for polypectomy of multiple colonic polyps and also                            the bowel preparation was inadequate                           - For future colonoscopy the patient will require                            an extended preparation.                           - Return to my office at appointment to be                            scheduled. Mauri Pole, MD 09/28/2017 4:33:48 PM This report has been signed electronically.

## 2017-09-28 NOTE — Progress Notes (Signed)
No problems noted in the recovery room. maw 

## 2017-09-28 NOTE — Progress Notes (Addendum)
Per Dr. Silverio Decamp pt needs and appointment to be seen in the office to go over pathology results.  Office nurse to call pt with the appointment.  Once Dr. Silverio Decamp sees the results, she will decide if pt needs another colonoscopy.  If so colon to be set up at the hospital for removal of multiple polyps and pt will need a 2 day prep.  Dr. Silverio Decamp said to wait and procedure will be scheduled at the appointment in the office if needed. Maw   Pt was also advised to make sure she takes her b/p med when she gets home.  maw

## 2017-09-28 NOTE — Progress Notes (Signed)
Called to room to assist during endoscopic procedure.  Patient ID and intended procedure confirmed with present staff. Received instructions for my participation in the procedure from the performing physician.  

## 2017-09-28 NOTE — Progress Notes (Signed)
Report to PACU, RN, vss, BBS= Clear.  

## 2017-09-28 NOTE — Patient Instructions (Signed)
YOU HAD AN ENDOSCOPIC PROCEDURE TODAY AT [facility]:   Refer to the procedure report that was given to you for any specific questions about what was found during the examination.  If the procedure report does not answer your questions, please call your gastroenterologist to clarify.  If you requested that your care partner not be given the details of your procedure findings, then the procedure report has been included in a sealed envelope for you to review at your convenience later.  YOU SHOULD EXPECT: Some feelings of bloating in the abdomen. Passage of more gas than usual.  Walking can help get rid of the air that was put into your GI tract during the procedure and reduce the bloating. If you had a lower endoscopy (such as a colonoscopy or flexible sigmoidoscopy) you may notice spotting of blood in your stool or on the toilet paper. If you underwent a bowel prep for your procedure, you may not have a normal bowel movement for a few days.  Please Note:  You might notice some irritation and congestion in your nose or some drainage.  This is from the oxygen used during your procedure.  There is no need for concern and it should clear up in a day or so.  SYMPTOMS TO REPORT IMMEDIATELY:   Following lower endoscopy (colonoscopy or flexible sigmoidoscopy):  Excessive amounts of blood in the stool  Significant tenderness or worsening of abdominal pains  Swelling of the abdomen that is new, acute  Fever of 100F or higher   Following upper endoscopy (EGD)  Vomiting of blood or coffee ground material  New chest pain or pain under the shoulder blades  Painful or persistently difficult swallowing  New shortness of breath  Fever of 100F or higher  Black, tarry-looking stools  For urgent or emergent issues, a gastroenterologist can be reached at any hour by calling [phone].   DIET:  We do recommend a small meal at first, but then you may proceed to your regular diet.  Drink  plenty of fluids but you should avoid alcoholic beverages for 24 hours.  ACTIVITY:  You should plan to take it easy for the rest of today and you should NOT DRIVE or use heavy machinery until tomorrow (because of the sedation medicines used during the test).    FOLLOW UP: Our staff will call the number listed on your records the next business day following your procedure to check on you and address any questions or concerns that you may have regarding the information given to you following your procedure. If we do not reach you, we will leave a message.  However, if you are feeling well and you are not experiencing any problems, there is no need to return our call.  We will assume that you have returned to your regular daily activities without incident.  If any biopsies were taken you will be contacted by phone or by letter within the next 1-3 weeks.  Please call us at [phone] if you have not heard about the biopsies in 3 weeks.    SIGNATURES/CONFIDENTIALITY: You and/or your care partner have signed paperwork which will be entered into your electronic medical record.  These signatures attest to the fact that that the information above on your After Visit Summary has been reviewed and is understood.  Full responsibility of the confidentiality of this discharge information lies with you and/or your care-partner.   Handouts were given to your care partner on gastritis, polyps,  diverticulosis, and hemorrhoids. NO ASPIRIN, ASPIRIN CONTAINING PRODUCTS (BC OR GOODY POWDERS) OR NSAIDS (IBUPROFEN, ADVIL, ALEVE, AND MOTRIN) FOR; TYLENOL IS OK TO TAKE.  Per Dr. Silverio Decamp you  may continue taking Aspirin 81 mg. You may resume your other  current medications today. Await biopsy results. Repeat colonoscopy to set up for you at office visit to discuss polypectomy results.  The nurse in [place] office will call you with the office visit. Please call if any questions or concerns.

## 2017-09-29 ENCOUNTER — Telehealth: Payer: Self-pay | Admitting: *Deleted

## 2017-09-29 NOTE — Telephone Encounter (Signed)
  Follow up Call-  Call back number 09/28/2017  Post procedure Call Back phone  # [phone]  Permission to leave phone message Yes  Some recent data might be hidden     Patient questions:  Do you have a fever, pain , or abdominal swelling? No. Pain Score  0 *  Have you tolerated food without any problems? Yes.    Have you been able to return to your normal activities? Yes.    Do you have any questions about your discharge instructions: Diet   No. Medications  No. Follow up visit  No.  Do you have questions or concerns about your Care? No.  Actions: * If pain score is 4 or above: No action needed, pain <4.

## 2017-10-05 ENCOUNTER — Telehealth: Payer: Self-pay | Admitting: Internal Medicine

## 2017-10-05 MED ORDER — ONETOUCH ULTRA 2 W/DEVICE KIT: PACK | 0 refills | Status: AC

## 2017-10-05 MED ORDER — GLUCOSE BLOOD VI STRP: 1.0000 | ORAL_STRIP | Freq: Two times a day (BID) | 3 refills | Status: AC

## 2017-10-05 MED ORDER — ONETOUCH ULTRASOFT LANCETS MISC: 3 refills | Status: AC

## 2017-10-05 NOTE — Telephone Encounter (Signed)
Copied from [place] [id number]. Topic: Quick Communication - Rx Refill/Question >> Oct 05, 2017 11:59 AM Berneta Levins wrote: Medication:  Cathie Beams with [employer] calling on behalf of the member to request that pt needs OneTouch meter and supplies ordered.  States that pharmacy has never received new RX for this. Cathie Beams can be reached at [phone]  Preferred Pharmacy (with phone number or street name): [facility] [place], [place] [phone] (Phone) [phone] (Fax)  Agent: Please be advised that RX refills may take up to 3 business days. We ask that you follow-up with your pharmacy.

## 2017-10-05 NOTE — Telephone Encounter (Signed)
Reviewed chart pt is up-to-date sent refills to [facility].Marland KitchenJohny Valdez

## 2017-10-11 ENCOUNTER — Other Ambulatory Visit: Payer: Self-pay

## 2017-10-11 DIAGNOSIS — K2970 Gastritis, unspecified, without bleeding: Secondary | ICD-10-CM

## 2017-10-11 DIAGNOSIS — K2990 Gastroduodenitis, unspecified, without bleeding: Principal | ICD-10-CM

## 2017-10-11 DIAGNOSIS — A048 Other specified bacterial intestinal infections: Secondary | ICD-10-CM

## 2017-10-11 DIAGNOSIS — Z86010 Personal history of colonic polyps: Secondary | ICD-10-CM

## 2017-10-11 MED ORDER — BIS SUBCIT-METRONID-TETRACYC 140-125-125 MG PO CAPS: 3.0000 | ORAL_CAPSULE | Freq: Three times a day (TID) | ORAL | 0 refills | Status: DC

## 2017-10-11 MED ORDER — OMEPRAZOLE 40 MG PO CPDR: 40.0000 mg | DELAYED_RELEASE_CAPSULE | Freq: Two times a day (BID) | ORAL | 0 refills | Status: AC

## 2017-11-03 ENCOUNTER — Telehealth: Payer: Self-pay | Admitting: Gastroenterology

## 2017-11-03 ENCOUNTER — Telehealth: Payer: Self-pay

## 2017-11-03 NOTE — Telephone Encounter (Signed)
Copied from [place] [phone]. Topic: General - Other >> Nov 03, 2017 11:50 AM Berneta Levins wrote: Reason for CRM:   Pt's daughter, Juliann Pulse, calling in.  States that pt is having some memory issues that Juliann Pulse believes needs to be addressed - but states it is not wise to speak about this in front of the pt as she is a former critical care nurse. Juliann Pulse reports that pt is having more frequent short term memory loss, seems to be tired a lot - wanting to lay in her bed, complaining of being cold.   Juliann Pulse reports that pt has always wanted to hide things from the children - and she has been visiting with family and family reports she may be depressed. Juliann Pulse can be reached at [phone]. Juliann Pulse requests that pt not be notified of this information.

## 2017-11-03 NOTE — Telephone Encounter (Signed)
Sounds like could be consistent with depression with pseudodementia or even dementia  Please consider OV

## 2017-11-03 NOTE — Telephone Encounter (Signed)
Spoke with the daughter at length.Daughter is listed on the DPR The patient is not wanting to get out of bed or do normal activities of daily living. Complains of being cold a lot. Is not receptive to family interventions or expressions of their concern. Daughter is concerned the patient may not prep as instructed for the colonoscopy. States the patient has not fully recovered from the previous colonoscopy. She has reached out to the PCP and is hoping to speak with them today. Understands she will likely need to have the patient seen. This will present an issue also because she is certain the patient will not reactive well to this suggestion.

## 2017-11-03 NOTE — Telephone Encounter (Signed)
Tried to reach daughter.  No VM.  Will try again later.

## 2017-11-04 NOTE — Telephone Encounter (Signed)
I have spoken with patients daughter in regard.  States patient has an appointment coming up on the 25th.  Daughter would like Dr. Jenny Reichmann to address these issues during that OV without mentioning she was the one who brought the issues up.

## 2017-11-08 ENCOUNTER — Encounter (HOSPITAL_COMMUNITY): Payer: Self-pay | Admitting: *Deleted

## 2017-11-09 ENCOUNTER — Telehealth: Payer: Self-pay | Admitting: Internal Medicine

## 2017-11-09 ENCOUNTER — Encounter (HOSPITAL_COMMUNITY): Payer: Self-pay | Admitting: Emergency Medicine

## 2017-11-09 ENCOUNTER — Ambulatory Visit (HOSPITAL_COMMUNITY): Payer: PPO

## 2017-11-09 ENCOUNTER — Inpatient Hospital Stay (HOSPITAL_COMMUNITY)
Admission: AD | Admit: 2017-11-09 | Discharge: 2017-11-12 | DRG: 189 | Disposition: A | Discharge status: Home / Self Care | Payer: PPO | Source: Ambulatory Visit | Attending: Internal Medicine | Admitting: Internal Medicine

## 2017-11-09 ENCOUNTER — Other Ambulatory Visit: Payer: Self-pay

## 2017-11-09 ENCOUNTER — Ambulatory Visit (HOSPITAL_COMMUNITY): Payer: PPO | Admitting: Anesthesiology

## 2017-11-09 ENCOUNTER — Encounter (HOSPITAL_COMMUNITY)
Admission: AD | Disposition: A | Discharge status: Home / Self Care | Payer: Self-pay | Source: Ambulatory Visit | Attending: Internal Medicine

## 2017-11-09 DIAGNOSIS — J95821 Acute postprocedural respiratory failure: Principal | ICD-10-CM | POA: Diagnosis present

## 2017-11-09 DIAGNOSIS — I739 Peripheral vascular disease, unspecified: Secondary | ICD-10-CM | POA: Diagnosis present

## 2017-11-09 DIAGNOSIS — Z8551 Personal history of malignant neoplasm of bladder: Secondary | ICD-10-CM

## 2017-11-09 DIAGNOSIS — D638 Anemia in other chronic diseases classified elsewhere: Secondary | ICD-10-CM | POA: Diagnosis not present

## 2017-11-09 DIAGNOSIS — R9431 Abnormal electrocardiogram [ECG] [EKG]: Secondary | ICD-10-CM | POA: Diagnosis not present

## 2017-11-09 DIAGNOSIS — I5043 Acute on chronic combined systolic (congestive) and diastolic (congestive) heart failure: Secondary | ICD-10-CM | POA: Diagnosis present

## 2017-11-09 DIAGNOSIS — Z860100 Personal history of colon polyps, unspecified: Secondary | ICD-10-CM

## 2017-11-09 DIAGNOSIS — R778 Other specified abnormalities of plasma proteins: Secondary | ICD-10-CM

## 2017-11-09 DIAGNOSIS — J690 Pneumonitis due to inhalation of food and vomit: Secondary | ICD-10-CM | POA: Diagnosis present

## 2017-11-09 DIAGNOSIS — I10 Essential (primary) hypertension: Secondary | ICD-10-CM | POA: Diagnosis not present

## 2017-11-09 DIAGNOSIS — Y838 Other surgical procedures as the cause of abnormal reaction of the patient, or of later complication, without mention of misadventure at the time of the procedure: Secondary | ICD-10-CM | POA: Diagnosis present

## 2017-11-09 DIAGNOSIS — J441 Chronic obstructive pulmonary disease with (acute) exacerbation: Secondary | ICD-10-CM | POA: Diagnosis not present

## 2017-11-09 DIAGNOSIS — Z7984 Long term (current) use of oral hypoglycemic drugs: Secondary | ICD-10-CM

## 2017-11-09 DIAGNOSIS — J9600 Acute respiratory failure, unspecified whether with hypoxia or hypercapnia: Secondary | ICD-10-CM | POA: Diagnosis present

## 2017-11-09 DIAGNOSIS — I5021 Acute systolic (congestive) heart failure: Secondary | ICD-10-CM | POA: Diagnosis not present

## 2017-11-09 DIAGNOSIS — E876 Hypokalemia: Secondary | ICD-10-CM | POA: Diagnosis not present

## 2017-11-09 DIAGNOSIS — I160 Hypertensive urgency: Secondary | ICD-10-CM | POA: Diagnosis present

## 2017-11-09 DIAGNOSIS — R7989 Other specified abnormal findings of blood chemistry: Secondary | ICD-10-CM

## 2017-11-09 DIAGNOSIS — T501X5A Adverse effect of loop [high-ceiling] diuretics, initial encounter: Secondary | ICD-10-CM | POA: Diagnosis not present

## 2017-11-09 DIAGNOSIS — J438 Other emphysema: Secondary | ICD-10-CM | POA: Diagnosis not present

## 2017-11-09 DIAGNOSIS — Y92530 Ambulatory surgery center as the place of occurrence of the external cause: Secondary | ICD-10-CM | POA: Diagnosis present

## 2017-11-09 DIAGNOSIS — Z85118 Personal history of other malignant neoplasm of bronchus and lung: Secondary | ICD-10-CM

## 2017-11-09 DIAGNOSIS — E43 Unspecified severe protein-calorie malnutrition: Secondary | ICD-10-CM

## 2017-11-09 DIAGNOSIS — Z923 Personal history of irradiation: Secondary | ICD-10-CM

## 2017-11-09 DIAGNOSIS — J9602 Acute respiratory failure with hypercapnia: Secondary | ICD-10-CM | POA: Diagnosis not present

## 2017-11-09 DIAGNOSIS — Z7982 Long term (current) use of aspirin: Secondary | ICD-10-CM

## 2017-11-09 DIAGNOSIS — E1151 Type 2 diabetes mellitus with diabetic peripheral angiopathy without gangrene: Secondary | ICD-10-CM | POA: Diagnosis present

## 2017-11-09 DIAGNOSIS — R627 Adult failure to thrive: Secondary | ICD-10-CM | POA: Diagnosis not present

## 2017-11-09 DIAGNOSIS — Z8049 Family history of malignant neoplasm of other genital organs: Secondary | ICD-10-CM

## 2017-11-09 DIAGNOSIS — F0390 Unspecified dementia without behavioral disturbance: Secondary | ICD-10-CM | POA: Diagnosis present

## 2017-11-09 DIAGNOSIS — J9601 Acute respiratory failure with hypoxia: Secondary | ICD-10-CM | POA: Diagnosis not present

## 2017-11-09 DIAGNOSIS — K648 Other hemorrhoids: Secondary | ICD-10-CM | POA: Diagnosis present

## 2017-11-09 DIAGNOSIS — R0902 Hypoxemia: Secondary | ICD-10-CM | POA: Diagnosis present

## 2017-11-09 DIAGNOSIS — Z801 Family history of malignant neoplasm of trachea, bronchus and lung: Secondary | ICD-10-CM | POA: Diagnosis not present

## 2017-11-09 DIAGNOSIS — Z951 Presence of aortocoronary bypass graft: Secondary | ICD-10-CM

## 2017-11-09 DIAGNOSIS — E785 Hyperlipidemia, unspecified: Secondary | ICD-10-CM | POA: Diagnosis present

## 2017-11-09 DIAGNOSIS — J449 Chronic obstructive pulmonary disease, unspecified: Secondary | ICD-10-CM | POA: Diagnosis present

## 2017-11-09 DIAGNOSIS — L8990 Pressure ulcer of unspecified site, unspecified stage: Secondary | ICD-10-CM

## 2017-11-09 DIAGNOSIS — F329 Major depressive disorder, single episode, unspecified: Secondary | ICD-10-CM | POA: Diagnosis present

## 2017-11-09 DIAGNOSIS — D125 Benign neoplasm of sigmoid colon: Secondary | ICD-10-CM | POA: Diagnosis not present

## 2017-11-09 DIAGNOSIS — R748 Abnormal levels of other serum enzymes: Secondary | ICD-10-CM | POA: Diagnosis not present

## 2017-11-09 DIAGNOSIS — I110 Hypertensive heart disease with heart failure: Secondary | ICD-10-CM | POA: Diagnosis present

## 2017-11-09 DIAGNOSIS — D128 Benign neoplasm of rectum: Secondary | ICD-10-CM | POA: Diagnosis present

## 2017-11-09 DIAGNOSIS — D509 Iron deficiency anemia, unspecified: Secondary | ICD-10-CM | POA: Diagnosis not present

## 2017-11-09 DIAGNOSIS — K5730 Diverticulosis of large intestine without perforation or abscess without bleeding: Secondary | ICD-10-CM | POA: Diagnosis not present

## 2017-11-09 DIAGNOSIS — Z8249 Family history of ischemic heart disease and other diseases of the circulatory system: Secondary | ICD-10-CM

## 2017-11-09 DIAGNOSIS — Z681 Body mass index (BMI) 19 or less, adult: Secondary | ICD-10-CM | POA: Diagnosis not present

## 2017-11-09 DIAGNOSIS — Z539 Procedure and treatment not carried out, unspecified reason: Secondary | ICD-10-CM | POA: Diagnosis not present

## 2017-11-09 DIAGNOSIS — I2510 Atherosclerotic heart disease of native coronary artery without angina pectoris: Secondary | ICD-10-CM | POA: Diagnosis present

## 2017-11-09 DIAGNOSIS — Z87891 Personal history of nicotine dependence: Secondary | ICD-10-CM

## 2017-11-09 DIAGNOSIS — I350 Nonrheumatic aortic (valve) stenosis: Secondary | ICD-10-CM | POA: Diagnosis present

## 2017-11-09 DIAGNOSIS — Z800 Family history of malignant neoplasm of digestive organs: Secondary | ICD-10-CM

## 2017-11-09 DIAGNOSIS — R0602 Shortness of breath: Secondary | ICD-10-CM

## 2017-11-09 DIAGNOSIS — K621 Rectal polyp: Secondary | ICD-10-CM | POA: Diagnosis not present

## 2017-11-09 DIAGNOSIS — D120 Benign neoplasm of cecum: Secondary | ICD-10-CM | POA: Diagnosis not present

## 2017-11-09 DIAGNOSIS — D127 Benign neoplasm of rectosigmoid junction: Secondary | ICD-10-CM | POA: Diagnosis not present

## 2017-11-09 DIAGNOSIS — D123 Benign neoplasm of transverse colon: Secondary | ICD-10-CM | POA: Diagnosis present

## 2017-11-09 DIAGNOSIS — J90 Pleural effusion, not elsewhere classified: Secondary | ICD-10-CM | POA: Diagnosis not present

## 2017-11-09 HISTORY — PX: SUBMUCOSAL INJECTION: SHX5543

## 2017-11-09 HISTORY — PX: COLONOSCOPY WITH PROPOFOL: SHX5780

## 2017-11-09 HISTORY — PX: POLYPECTOMY: SHX5525

## 2017-11-09 LAB — BLOOD GAS, ARTERIAL
Acid-Base Excess: 2 mmol/L (ref 0.0–2.0)
Bicarbonate: 26.6 mmol/L (ref 20.0–28.0)
Drawn by: 331471
FIO2: 100
O2 SAT: 82.6 %
PATIENT TEMPERATURE: 98.6
PO2 ART: 52.8 mmHg — AB (ref 83.0–108.0)
pCO2 arterial: 44 mmHg (ref 32.0–48.0)
pH, Arterial: 7.398 (ref 7.350–7.450)

## 2017-11-09 LAB — LACTIC ACID, PLASMA
LACTIC ACID, VENOUS: 1.3 mmol/L (ref 0.5–1.9)
Lactic Acid, Venous: 2.9 mmol/L (ref 0.5–1.9)

## 2017-11-09 LAB — COMPREHENSIVE METABOLIC PANEL
ALBUMIN: 2.4 g/dL — AB (ref 3.5–5.0)
ALT: 19 U/L (ref 0–44)
ANION GAP: 13 (ref 5–15)
AST: 39 U/L (ref 15–41)
Alkaline Phosphatase: 77 U/L (ref 38–126)
BUN: 13 mg/dL (ref 8–23)
CHLORIDE: 99 mmol/L (ref 98–111)
CO2: 26 mmol/L (ref 22–32)
Calcium: 8.7 mg/dL — ABNORMAL LOW (ref 8.9–10.3)
Creatinine, Ser: 0.75 mg/dL (ref 0.44–1.00)
GFR calc Af Amer: >60 mL/min (ref >60)
GFR calc non Af Amer: >60 mL/min (ref >60)
GLUCOSE: 227 mg/dL — AB (ref 70–99)
POTASSIUM: 3.3 mmol/L — AB (ref 3.5–5.1)
SODIUM: 138 mmol/L (ref 135–145)
Total Bilirubin: 0.4 mg/dL (ref 0.3–1.2)
Total Protein: 7.6 g/dL (ref 6.5–8.1)

## 2017-11-09 LAB — CBC
HCT: 32.9 % — ABNORMAL LOW (ref 36.0–46.0)
Hemoglobin: 10.1 g/dL — ABNORMAL LOW (ref 12.0–15.0)
MCH: 25.9 pg — ABNORMAL LOW (ref 26.0–34.0)
MCHC: 30.7 g/dL (ref 30.0–36.0)
MCV: 84.4 fL (ref 78.0–100.0)
PLATELETS: 497 10*3/uL — AB (ref 150–400)
RBC: 3.9 MIL/uL (ref 3.87–5.11)
RDW: 15.8 % — AB (ref 11.5–15.5)
WBC: 20.2 10*3/uL — AB (ref 4.0–10.5)

## 2017-11-09 LAB — TROPONIN I
Troponin I: 0.03 ng/mL (ref <0.03)
Troponin I: 0.58 ng/mL (ref <0.03)

## 2017-11-09 LAB — MRSA PCR SCREENING: MRSA BY PCR: NEGATIVE

## 2017-11-09 LAB — GLUCOSE, CAPILLARY
Glucose-Capillary: 112 mg/dL — ABNORMAL HIGH (ref 70–99)
Glucose-Capillary: 135 mg/dL — ABNORMAL HIGH (ref 70–99)
Glucose-Capillary: 85 mg/dL (ref 70–99)

## 2017-11-09 LAB — BRAIN NATRIURETIC PEPTIDE: B Natriuretic Peptide: 2912.1 pg/mL — ABNORMAL HIGH (ref 0.0–100.0)

## 2017-11-09 LAB — PROCALCITONIN: Procalcitonin: <0.1 ng/mL

## 2017-11-09 SURGERY — COLONOSCOPY WITH PROPOFOL
Anesthesia: Monitor Anesthesia Care

## 2017-11-09 MED ORDER — LIDOCAINE 2% (20 MG/ML) 5 ML SYRINGE
INTRAMUSCULAR | Status: DC | PRN
  Administered 2017-11-09: 100 mg via INTRAVENOUS

## 2017-11-09 MED ORDER — ORAL CARE MOUTH RINSE
15.0000 mL | Freq: Two times a day (BID) | OROMUCOSAL | Status: DC
  Administered 2017-11-11 (×2): 15 mL via OROMUCOSAL

## 2017-11-09 MED ORDER — ASPIRIN EC 81 MG PO TBEC: 81.0000 mg | DELAYED_RELEASE_TABLET | Freq: Every day | ORAL | Status: DC

## 2017-11-09 MED ORDER — CHLORHEXIDINE GLUCONATE 0.12 % MT SOLN
15.0000 mL | Freq: Two times a day (BID) | OROMUCOSAL | Status: DC
  Administered 2017-11-10 – 2017-11-12 (×4): 15 mL via OROMUCOSAL
  Filled 2017-11-09 (×5): qty 15

## 2017-11-09 MED ORDER — ALBUTEROL SULFATE (2.5 MG/3ML) 0.083% IN NEBU
INHALATION_SOLUTION | RESPIRATORY_TRACT | Status: AC
  Filled 2017-11-09: qty 3

## 2017-11-09 MED ORDER — ASPIRIN 300 MG RE SUPP
150.0000 mg | Freq: Every day | RECTAL | Status: DC
  Administered 2017-11-09: 150 mg via RECTAL
  Filled 2017-11-09: qty 1

## 2017-11-09 MED ORDER — SODIUM CHLORIDE 0.9 % IV SOLN
3.0000 g | Freq: Three times a day (TID) | INTRAVENOUS | Status: DC
  Administered 2017-11-09 – 2017-11-10 (×2): 3 g via INTRAVENOUS
  Filled 2017-11-09 (×3): qty 3

## 2017-11-09 MED ORDER — SODIUM CHLORIDE 0.9 % IV BOLUS: 500.0000 mL | Freq: Once | INTRAVENOUS | Status: DC

## 2017-11-09 MED ORDER — PANTOPRAZOLE SODIUM 40 MG PO TBEC
40.0000 mg | DELAYED_RELEASE_TABLET | Freq: Every day | ORAL | Status: DC
  Administered 2017-11-09 – 2017-11-12 (×4): 40 mg via ORAL
  Filled 2017-11-09 (×4): qty 1

## 2017-11-09 MED ORDER — ISOSORBIDE MONONITRATE ER 30 MG PO TB24
30.0000 mg | ORAL_TABLET | Freq: Every day | ORAL | Status: DC
  Administered 2017-11-09 – 2017-11-12 (×4): 30 mg via ORAL
  Filled 2017-11-09 (×4): qty 1

## 2017-11-09 MED ORDER — FUROSEMIDE 10 MG/ML IJ SOLN
40.0000 mg | Freq: Two times a day (BID) | INTRAMUSCULAR | Status: DC
  Administered 2017-11-09 – 2017-11-10 (×2): 40 mg via INTRAVENOUS
  Filled 2017-11-09 (×2): qty 4

## 2017-11-09 MED ORDER — PRAVASTATIN SODIUM 40 MG PO TABS
40.0000 mg | ORAL_TABLET | Freq: Every evening | ORAL | Status: DC
  Administered 2017-11-10 – 2017-11-11 (×2): 40 mg via ORAL
  Filled 2017-11-09: qty 1
  Filled 2017-11-09: qty 2
  Filled 2017-11-09 (×3): qty 1
  Filled 2017-11-09: qty 2

## 2017-11-09 MED ORDER — HYDRALAZINE HCL 25 MG PO TABS
25.0000 mg | ORAL_TABLET | Freq: Three times a day (TID) | ORAL | Status: DC
  Administered 2017-11-10 – 2017-11-12 (×7): 25 mg via ORAL
  Filled 2017-11-09 (×8): qty 1

## 2017-11-09 MED ORDER — HYDRALAZINE HCL 20 MG/ML IJ SOLN
10.0000 mg | INTRAMUSCULAR | Status: DC | PRN
  Administered 2017-11-09 (×2): 20 mg via INTRAVENOUS
  Filled 2017-11-09 (×2): qty 1

## 2017-11-09 MED ORDER — IPRATROPIUM-ALBUTEROL 0.5-2.5 (3) MG/3ML IN SOLN
3.0000 mL | RESPIRATORY_TRACT | Status: DC
  Administered 2017-11-09 – 2017-11-11 (×11): 3 mL via RESPIRATORY_TRACT
  Filled 2017-11-09 (×11): qty 3

## 2017-11-09 MED ORDER — FUROSEMIDE 10 MG/ML IJ SOLN: 40.0000 mg | Freq: Two times a day (BID) | INTRAMUSCULAR | Status: DC

## 2017-11-09 MED ORDER — PROPOFOL 500 MG/50ML IV EMUL
INTRAVENOUS | Status: DC | PRN
  Administered 2017-11-09: 125 ug/kg/min via INTRAVENOUS

## 2017-11-09 MED ORDER — PROPOFOL 10 MG/ML IV BOLUS
INTRAVENOUS | Status: DC | PRN
  Administered 2017-11-09: 20 mg via INTRAVENOUS

## 2017-11-09 MED ORDER — INSULIN ASPART 100 UNIT/ML ~~LOC~~ SOLN
0.0000 [IU] | SUBCUTANEOUS | Status: DC
  Administered 2017-11-09: 1 [IU] via SUBCUTANEOUS

## 2017-11-09 MED ORDER — METHYLPREDNISOLONE SODIUM SUCC 40 MG IJ SOLR
40.0000 mg | Freq: Two times a day (BID) | INTRAMUSCULAR | Status: DC
  Administered 2017-11-09 – 2017-11-11 (×4): 40 mg via INTRAVENOUS
  Filled 2017-11-09 (×4): qty 1

## 2017-11-09 MED ORDER — HYDRALAZINE HCL 20 MG/ML IJ SOLN
10.0000 mg | INTRAMUSCULAR | Status: DC | PRN
  Administered 2017-11-11: 10 mg via INTRAVENOUS
  Filled 2017-11-09: qty 1

## 2017-11-09 MED ORDER — ADULT MULTIVITAMIN W/MINERALS CH
1.0000 | ORAL_TABLET | Freq: Every day | ORAL | Status: DC
  Administered 2017-11-10 – 2017-11-12 (×3): 1 via ORAL
  Filled 2017-11-09 (×3): qty 1

## 2017-11-09 MED ORDER — SODIUM CHLORIDE 0.9 % IV BOLUS
250.0000 mL | Freq: Once | INTRAVENOUS | Status: AC
  Administered 2017-11-09: 250 mL via INTRAVENOUS

## 2017-11-09 MED ORDER — PROPOFOL 10 MG/ML IV BOLUS
INTRAVENOUS | Status: AC
  Filled 2017-11-09: qty 40

## 2017-11-09 MED ORDER — EPHEDRINE SULFATE 50 MG/ML IJ SOLN
INTRAMUSCULAR | Status: DC | PRN
  Administered 2017-11-09: 10 mg via INTRAVENOUS

## 2017-11-09 MED ORDER — SODIUM CHLORIDE 0.9 % IV SOLN: INTRAVENOUS | Status: DC

## 2017-11-09 MED ORDER — ALBUTEROL SULFATE (2.5 MG/3ML) 0.083% IN NEBU
2.5000 mg | INHALATION_SOLUTION | Freq: Once | RESPIRATORY_TRACT | Status: AC
  Administered 2017-11-09: 2.5 mg via RESPIRATORY_TRACT

## 2017-11-09 MED ORDER — CARVEDILOL 6.25 MG PO TABS
18.7500 mg | ORAL_TABLET | Freq: Two times a day (BID) | ORAL | Status: DC
  Administered 2017-11-09 – 2017-11-12 (×6): 18.75 mg via ORAL
  Filled 2017-11-09 (×6): qty 1

## 2017-11-09 MED ORDER — LACTATED RINGERS IV SOLN
INTRAVENOUS | Status: DC
  Administered 2017-11-09: 10:00:00 via INTRAVENOUS

## 2017-11-09 MED ORDER — SODIUM CHLORIDE 0.9 % IV SOLN
INTRAVENOUS | Status: DC | PRN
  Administered 2017-11-09: 1000 mL via INTRAVENOUS

## 2017-11-09 MED ORDER — SODIUM CHLORIDE 0.9% FLUSH
3.0000 mL | Freq: Two times a day (BID) | INTRAVENOUS | Status: DC
  Administered 2017-11-09 – 2017-11-11 (×4): 3 mL via INTRAVENOUS

## 2017-11-09 SURGICAL SUPPLY — 22 items

## 2017-11-09 NOTE — H&P (Signed)
[facility] History and Physical   Primary Care Physician:  Biagio Borg, MD   Reason for Procedure:  Multiple colon polyps/ Polypectomy  Plan:    Colonoscopy with polypectomy     HPI: Tonya Valdez is a 77 y.o. female here for colonoscopy with polypectomy for multiple colon polyps. The risks and benefits as well as alternatives of endoscopic procedure(s) have been discussed and reviewed. All questions answered. The patient agrees to proceed.    Past Medical History:  Diagnosis Date  . Bilateral carotid artery disease ([place]) 09/26/2012   carotid US (8/14):  R 80-99; L 60-79 => s/p R CEA 10/2012 (Dr. Donnetta Hutching)  . Bladder cancer ([place]) 04/26/2013  . Bladder tumor   . CAD (coronary artery disease)    a. LHC (10/07/12):  Ostial LM 60%, distal LM 40-50%, oLAD 40-50%, mLAD 70-80%, pCFX subtotally occluded, mCFX 80-90%, oRCA occluded, ostial L subclavian 40%, EF 25-30%, abdominal aorta diffusely diseased with severe ulcerated plaquing (right iliac ostial 95%; left renal long 60-70%, right okay). CO 2.1, CI 1.5 => s/p CABG (L-LAD, S-OM, S-PDA) 10/2012 with Dr. Cyndia Bent   . Cancer ([place])    bladder  . Cataract    left eye  . Chronic systolic CHF (congestive heart failure) ([place]) 08/04/2012   Echo (08/17/12): EF 30%, diffuse HK, mild MR, mild BAE, PASP.;  Echo (01/2013): EF 40-45%, inferior posterior HK, Gr 2 DD, mild MR, mild LAE  . COPD (chronic obstructive pulmonary disease) ([place])   . 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 H/O hiatal hernia    s/p  77 yrs old  . Hyperkalemia    ACEI d/c'd 11/2012  . Hyperlipidemia   . Hypertension    controlled with medications  . Ischemic cardiomyopathy   . Lung cancer ([place])   . Lung nodule    chest CT 09/2012 => needs repeat by 03/2013  . PAD (peripheral artery disease) ([place])   . 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 Radiation 4/26,4/28,5/3,5/5,5/10   Left lung 5 fractions at 10 Gy  . Wears glasses     Past Surgical History:  Procedure Laterality Date  . ABDOMINAL HYSTERECTOMY  1981 (APPROX)   BILATERAL SALPINGO-OOPHORECTOMY WITH EXCEPTION A SMALL PIECE OF OVARY REMAINS  . APPENDECTOMY    . CARDIAC CATHETERIZATION    . CHOLECYSTECTOMY    . COLONOSCOPY    . CORONARY ARTERY BYPASS GRAFT N/A 11/14/2012   Procedure: CORONARY ARTERY BYPASS GRAFTING (CABG) times three using left internal mammary artery and left saphenous vein;  Surgeon: Gaye Pollack, MD; L-LAD, S-OM, S-PDA   . ENDARTERECTOMY Right 11/14/2012   Procedure: RIGHT ENDARTERECTOMY CAROTID with patch angioplasty.;  Surgeon: Rosetta Posner, MD;  Location: [facility];  Service: Vascular;  Laterality: Right;  . ENDARTERECTOMY Left 11/08/2015   Procedure: ENDARTERECTOMY CAROTID WITH PATCH ANGIOPLASTY;  Surgeon: Rosetta Posner, MD;  Location: [facility];  Service: Vascular;  Laterality: Left;  . HAMMER TOE SURGERY Left   . HIATAL HERNIA REPAIR  1964  (AGE 72)   CHOLECYSTECTOMY AND APPENDECTOMY  . INTRAOPERATIVE TRANSESOPHAGEAL ECHOCARDIOGRAM N/A 11/14/2012   Procedure: INTRAOPERATIVE TRANSESOPHAGEAL ECHOCARDIOGRAM;  Surgeon: Gaye Pollack, MD;  Location[facility];  Service: Open Heart Surgery;  Laterality: N/A;  . LEFT ELBOW SURGERY    . LUMBAR DISC SURGERY  [place]  . TONSILLECTOMY    . TRANSURETHRAL RESECTION OF BLADDER TUMOR N/A 06/24/2012   Procedure: TRANSURETHRAL RESECTION OF BLADDER TUMOR  WITH GYRUS AND INSTILLATION OF MITOMYCIN C  (TURBT);  Surgeon: Claybon Jabs, MD;  Location: [facility];  Service: Urology;  Laterality: N/A;  . WRIST SURGERY Left     Prior to Admission medications   Medication Sig Start Date End Date Taking? Authorizing Provider  aspirin EC 81 MG tablet Take 81 mg by mouth daily.   Yes [provider]  carvedilol (COREG) 12.5 MG tablet Take 1 & 1/2 tablets by mouth twice a day with  a meal Patient taking differently: Take 18.75 mg by mouth 2 (two) times daily with a meal.  07/15/17  Yes Lendon Colonel, NP  ferrous sulfate (SLOW IRON) 160 (50 Fe) MG TBCR SR tablet Take 1 tablet (160 mg total) by mouth daily. 07/15/17  Yes Biagio Borg, MD  furosemide (LASIX) 20 MG tablet Take 1 tablet (20 mg total) by mouth daily. NEED OV. 07/15/17  Yes Lendon Colonel, NP  hydrALAZINE (APRESOLINE) 25 MG tablet Take 1 tablet (25 mg total) by mouth 3 (three) times daily. NEED OV. 07/15/17  Yes Lendon Colonel, NP  Hypromellose (ARTIFICIAL TEARS OP) Place 1 drop into both eyes 4 (four) times daily as needed (for dry eyes).   Yes [provider]  isosorbide mononitrate (IMDUR) 30 MG 24 hr tablet Take 1 tablet (30 mg total) by mouth daily. 07/15/17  Yes Lendon Colonel, NP  metFORMIN (GLUCOPHAGE) 500 MG tablet Take 1 tablet by mouth twice a day with a meal Patient taking differently: Take 500 mg by mouth 2 (two) times daily with a meal.  07/15/17  Yes Biagio Borg, MD  Multiple Vitamin (MULTIVITAMIN) tablet Take 1 tablet by mouth daily.   Yes [provider]  Omega-3 Fatty Acids (FISH OIL) 1200 MG CAPS Take 1,200 mg by mouth daily.    Yes [provider]  pravastatin (PRAVACHOL) 40 MG tablet Take 1 tablet (40 mg total) by mouth every evening. NEED OV. 07/15/17  Yes Lendon Colonel, NP  bismuth-metronidazole-tetracycline [facility]) [phone] MG capsule Take 3 capsules by mouth 4 (four) times daily -  before meals and at bedtime. Patient not taking: Reported on 11/08/2017 10/11/17   Mauri Pole, MD  Blood Glucose Monitoring Suppl (ONE TOUCH ULTRA 2) w/Device KIT Use to check blood sugars twice a day Dx E11.9 Patient not taking: Reported on 11/08/2017 10/05/17   Biagio Borg, MD  glucose blood (ONE TOUCH ULTRA TEST) test strip 1 each by Other route 2 (two) times daily. Use to check blood sugar two times a day. Dx E11.9 Patient not taking: Reported on  11/08/2017 10/05/17   Biagio Borg, MD  Lancets [facility] ULTRASOFT) lancets 1 each by Other route 4 (four) times daily. Use to help check blood sugars four times a day Dx E11.9 Patient not taking: Reported on 11/08/2017 07/03/15   Biagio Borg, MD  Lancets [facility] ULTRASOFT) lancets Use to help check blood sugars twice a day Patient not taking: Reported on 11/08/2017 10/05/17   Biagio Borg, MD  omeprazole (PRILOSEC) 40 MG capsule Take 1 capsule (40 mg total) by mouth 2 (two) times daily. Patient not taking: Reported on 11/08/2017 10/11/17   Mauri Pole, MD  Vitamin D, Ergocalciferol, (DRISDOL) 50000 units CAPS capsule Take 1 capsule (50,000 Units total) by  mouth every 7 (seven) days. Patient not taking: Reported on 11/08/2017 07/15/17   Biagio Borg, MD    No current facility-administered medications for this encounter.     Allergies as of 10/11/2017  . (No Known Allergies)    Family History  Problem Relation Age of Onset  . Heart disease Mother        MI at age 88  . Cervical cancer Mother   . Cancer Mother   . Diabetes Mother   . Hypertension Mother   . Lung cancer Father 12       primary site lung CA then colon and bone  . Colon cancer Father   . Cancer Father   . Hypertension Father   . Cancer Other        colon cancer  . Hypertension Other   . Kidney disease Other   . Diabetes Other     Social History   Socioeconomic History  . Marital status: Widowed    Spouse name: Not on file  . Number of children: 2  . Years of education: 63  . Highest education level: Not on file  Occupational History  . Occupation: Therapist, sports, Multimedia programmer: [employer]  . Financial resource strain: Not on file  . Food insecurity:    Worry: Not on file    Inability: Not on file  . Transportation needs:    Medical: Not on file    Non-medical: Not on file  Tobacco Use  . Smoking status: Former Smoker    Packs/day: 1.00    Years: 50.00    Pack years: 50.00     Types: Cigarettes    Last attempt to quit: 04/23/2013    Years since quitting: 4.5  . Smokeless tobacco: Never Used  . Tobacco comment: today pt says she smokes occassionally  Substance and Sexual Activity  . Alcohol use: No    Alcohol/week: 0.0 standard drinks    Comment: occasional wine not often  . Drug use: No  . Sexual activity: Not Currently  Lifestyle  . Physical activity:    Days per week: Not on file    Minutes per session: Not on file  . Stress: Not on file  Relationships  . Social connections:    Talks on phone: Not on file    Gets together: Not on file    Attends religious service: Not on file    Active member of club or organization: Not on file    Attends meetings of clubs or organizations: Not on file    Relationship status: Not on file  . Intimate partner violence:    Fear of current or ex partner: Not on file    Emotionally abused: Not on file    Physically abused: Not on file    Forced sexual activity: Not on file  Other Topics Concern  . Not on file  Social History Narrative  . Not on file    Review of Systems:  All other review of systems negative except as mentioned in the HPI.  Physical Exam: Vital signs in last 24 hours:     General:   Alert,  Well-developed, well-nourished, pleasant and cooperative in NAD Lungs:  Clear throughout to auscultation.   Heart:  Regular rate and rhythm; no murmurs, clicks, rubs,  or gallops. Abdomen:  Soft, nontender and nondistended. Normal bowel sounds.   Neuro/Psych:  Alert and cooperative. Normal mood and affect. A and O x 3  Damaris Hippo , MD [phone]

## 2017-11-09 NOTE — Progress Notes (Addendum)
Pt having decreased O2 sats down to high 70s/low 80s. She is awake and alert and complains of no dyspnea and does not appear distressed. Bilateral breath sounds are course and ronchorous. Given albuterol neb with mild improvement. With face mask sats are high 80s/low 90s. Discussed with Dr. Silverio Decamp, patient, and patient's daughter, and all agreed that patient should be admitted overnight for close monitoring until sats recover. According to daughter, patient has been experiencing increased dyspnea and fatigue at home. She does have an extensive history of lung disease including COPD and lung cancer s/p radiation. CXR shows findings consistent with COPD, small bilateral pleural effusions and increased interstitial markings diffusely.

## 2017-11-09 NOTE — Anesthesia Postprocedure Evaluation (Signed)
Anesthesia Post Note  Patient: [name]  Procedure(s) Performed: COLONOSCOPY WITH PROPOFOL (N/A ) POLYPECTOMY SUBMUCOSAL INJECTION     Patient location during evaluation: Endoscopy Anesthesia Type: MAC Level of consciousness: awake and alert Pain management: pain level controlled Vital Signs Assessment: post-procedure vital signs reviewed and stable Respiratory status: spontaneous breathing, nonlabored ventilation, patient connected to face mask oxygen and respiratory function unstable Cardiovascular status: blood pressure returned to baseline and stable Postop Assessment: no apparent nausea or vomiting Anesthetic complications: yes Anesthetic complication details: respiratory eventComments: Pt initially in recovery having decreased O2 sats down to high 70s/low 80s. She is awake and alert and complains of no dyspnea and does not appear distressed. Bilateral breath sounds are course and ronchorous. Given albuterol neb with mild improvement. With face mask at 8L sats are high 80s/low 90s. Discussed with Dr. Silverio Decamp, patient, and patient's daughter, and all agreed that patient should be admitted overnight for closer monitoring until sats recover. According to daughter, patient has been experiencing increased dyspnea and fatigue at home. She does have an extensive history of lung disease including COPD and lung cancer s/p partial pneumonectomy. CXR shows findings consistent with COPD, small bilateral pleural effusions and increased interstitial markings diffusely.     Last Vitals:  Vitals:   11/09/17 1330 11/09/17 1340  BP: (!) 183/47 (!) 198/67  Pulse: 68 80  Resp: 18 20  Temp:    SpO2: (!) 88% (!) 80%    Last Pain:  Vitals:   11/09/17 1228  TempSrc:   PainSc: 0-No pain                 Lidia Collum

## 2017-11-09 NOTE — Progress Notes (Signed)
eLink Physician-Brief Progress Note Patient Name: Tonya Valdez DOB: 24-Jan-1941 MRN: [id number]   Date of Service  11/09/2017  HPI/Events of Note  Troponin = 0.03 --> 0.58. Already on ASA. Demand ischemia?  eICU Interventions  Will order: 1. 12 Lead EKG now.  1. Continue to trend troponin.     Intervention Category Intermediate Interventions: Diagnostic test evaluation  Lysle Dingwall 11/09/2017, 8:17 PM

## 2017-11-09 NOTE — Progress Notes (Signed)
Events noted-during 250 cc bolus bo now 90 sys  mentating fair and is more coherent but talking is difficult as on bipap weaned from 18/8-->16/8 FIo2 down from 75-->60   P complete bolus D/c order for prn iv hydralazine 10-40 Nurse aware to discuss with MD/Box if further needs Encouraged patient to share more info with daughter  Verneita Griffes, MD [facility] 6:36 PM

## 2017-11-09 NOTE — Progress Notes (Signed)
On initial assessment after transfer to SD unit from Endoscopy pt. BP was 225/112. Per MD order 40 mg Lasix was given. In addition, patient received scheduled home meds (18.75 mg of Coreg , 30 mg of Imdur). Pt. Continued to remain hypertensive with systolic BP in the 859'C. Patient had a prn order of 10-40mg  of Hydralazine.. RN gave 20 mg of Hydralazine. BP stayed in the 160's. So RN gave another 20 mg of Hydralazine after 1 hr.  Now, pt. BP dropped to 76'F systolic. Informed Dr . Verlon Au. Per order pt. placed in reverse trendelenburg position & given 250 ml bolus of NS. All other VS stable. Pt. On 60 % FiO2 BiPAP with SpO2 98 %. A& O. Will continue to monitor.

## 2017-11-09 NOTE — H&P (Signed)
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 Boykin Reaper 01/01/2012 showed 2 large pedunculated polyps status post removal tubular adenoma Recent diagnosis of depression and pseudodementia or dementia  Admitted for elective colonoscopy-at that time of colonoscopy multiple polyps found 1 hour spent trying to remove the same-patient decompensated on wake-up-needed CPAP with 15 L of oxygen-she currently is in the PACU/endoscopy suite getting oxygen via facemask and refused to use the CPAP initially As she got progressively more weak she gives me some history tells me does not use any inhalers quit smoking 2 months ago with Her daughter is at the bedside and gives me history of weight loss in addition to progressive memory deficits 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 Bilateral carotid artery disease ([place]) 09/26/2012   carotid US (8/14):  R 80-99; L 60-79 => s/p R CEA 10/2012 (Dr. Donnetta Hutching)  . Bladder cancer ([place]) 04/26/2013  . Bladder tumor   . CAD (coronary artery disease)    a. LHC (10/07/12):  Ostial LM 60%, distal LM 40-50%, oLAD 40-50%, mLAD 70-80%, pCFX subtotally occluded, mCFX 80-90%,  oRCA occluded, ostial L subclavian 40%, EF 25-30%, abdominal aorta diffusely diseased with severe ulcerated plaquing (right iliac ostial 95%; left renal long 60-70%, right okay). CO 2.1, CI 1.5 => s/p CABG (L-LAD, S-OM, S-PDA) 10/2012 with Dr. Cyndia Bent   . Cancer ([place])    bladder  . Cataract    left eye  . Chronic systolic CHF (congestive heart failure) ([place]) 08/04/2012   Echo (08/17/12): EF 30%, diffuse HK, mild MR, mild BAE, PASP.;  Echo (01/2013): EF 40-45%, inferior posterior HK, Gr 2 DD, mild MR, mild LAE  . COPD (chronic obstructive pulmonary disease) ([place])   . Diabetes mellitus ([place]) PCP DR Cathlean Cower   Type II, PER PT DOES NOT TAKES METFORMIN PRESCRIBED, SHE WATCHES DIET AND EXERCISES  . H/O hiatal hernia    s/p  77 yrs old  . Hyperkalemia    ACEI d/c'd 11/2012  . Hyperlipidemia   . Hypertension    controlled with medications  . Ischemic cardiomyopathy   . Lung cancer ([place])   . Lung nodule    chest CT 09/2012 => needs repeat by 03/2013  . PAD (peripheral artery disease) ([place])   . 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 Radiation 4/26,4/28,5/3,5/5,5/10   Left lung 5 fractions at 10 Gy  . Wears glasses     Past Surgical History:  Procedure Laterality Date  . ABDOMINAL HYSTERECTOMY  1981 (APPROX)   BILATERAL SALPINGO-OOPHORECTOMY WITH EXCEPTION A SMALL PIECE OF OVARY REMAINS  . APPENDECTOMY    . CARDIAC  CATHETERIZATION    . CHOLECYSTECTOMY    . COLONOSCOPY    . CORONARY ARTERY BYPASS GRAFT N/A 11/14/2012   Procedure: CORONARY ARTERY BYPASS GRAFTING (CABG) times three using left internal mammary artery and left saphenous vein;  Surgeon: Gaye Pollack, MD; L-LAD, S-OM, S-PDA   . ENDARTERECTOMY Right 11/14/2012   Procedure: RIGHT ENDARTERECTOMY CAROTID with patch angioplasty.;  Surgeon: Rosetta Posner, MD;  Location: [facility];  Service: Vascular;  Laterality: Right;  . ENDARTERECTOMY Left 11/08/2015   Procedure: ENDARTERECTOMY CAROTID WITH  PATCH ANGIOPLASTY;  Surgeon: Rosetta Posner, MD;  Location: [facility];  Service: Vascular;  Laterality: Left;  . HAMMER TOE SURGERY Left   . HIATAL HERNIA REPAIR  1964  (AGE 64)   CHOLECYSTECTOMY AND APPENDECTOMY  . INTRAOPERATIVE TRANSESOPHAGEAL ECHOCARDIOGRAM N/A 11/14/2012   Procedure: INTRAOPERATIVE TRANSESOPHAGEAL ECHOCARDIOGRAM;  Surgeon: Gaye Pollack, MD;  Location: [facility] OR;  Service: Open Heart Surgery;  Laterality: N/A;  . LEFT ELBOW SURGERY    . [place] SURGERY  1993  . TONSILLECTOMY  1959  . TONSILLECTOMY    . TRANSURETHRAL RESECTION OF BLADDER TUMOR N/A 06/24/2012   Procedure: TRANSURETHRAL RESECTION OF BLADDER TUMOR  WITH GYRUS AND INSTILLATION OF MITOMYCIN C  (TURBT);  Surgeon: Claybon Jabs, MD;  Location: [facility];  Service: Urology;  Laterality: N/A;  . WRIST SURGERY Left      reports that she quit smoking about 4 years ago. Her smoking use included cigarettes. She has a 50.00 pack-year smoking history. She has never used smokeless tobacco. She reports that she does not drink alcohol or use drugs. Mobility: Independent  No Known Allergies  Family History  Problem Relation Age of Onset  . Heart disease Mother        MI at age 71  . Cervical cancer Mother   . Cancer Mother   . Diabetes Mother   . Hypertension Mother   . Lung cancer Father 50       primary site lung CA then colon and bone  . Colon cancer Father   . Cancer Father   . Hypertension Father   . Cancer Other        colon cancer  . Hypertension Other   . Kidney disease Other   . Diabetes Other      Prior to Admission medications   Medication Sig Start Date End Date Taking? Authorizing Provider  aspirin EC 81 MG tablet Take 81 mg by mouth daily.   Yes [provider]  carvedilol (COREG) 12.5 MG tablet Take 1 & 1/2 tablets by mouth twice a day with a meal Patient taking differently: Take 18.75 mg by mouth 2 (two) times daily with a meal.  07/15/17  Yes Lendon Colonel, NP    ferrous sulfate (SLOW IRON) 160 (50 Fe) MG TBCR SR tablet Take 1 tablet (160 mg total) by mouth daily. 07/15/17  Yes Biagio Borg, MD  furosemide (LASIX) 20 MG tablet Take 1 tablet (20 mg total) by mouth daily. NEED OV. 07/15/17  Yes Lendon Colonel, NP  hydrALAZINE (APRESOLINE) 25 MG tablet Take 1 tablet (25 mg total) by mouth 3 (three) times daily. NEED OV. 07/15/17  Yes Lendon Colonel, NP  Hypromellose (ARTIFICIAL TEARS OP) Place 1 drop into both eyes 4 (four) times daily as needed (for dry eyes).   Yes [provider]  isosorbide mononitrate (IMDUR) 30 MG 24 hr tablet Take 1 tablet (30 mg total) by mouth  daily. 07/15/17  Yes Lendon Colonel, NP  metFORMIN (GLUCOPHAGE) 500 MG tablet Take 1 tablet by mouth twice a day with a meal Patient taking differently: Take 500 mg by mouth 2 (two) times daily with a meal.  07/15/17  Yes Biagio Borg, MD  Multiple Vitamin (MULTIVITAMIN) tablet Take 1 tablet by mouth daily.   Yes [provider]  Omega-3 Fatty Acids (FISH OIL) 1200 MG CAPS Take 1,200 mg by mouth daily.    Yes [provider]  pravastatin (PRAVACHOL) 40 MG tablet Take 1 tablet (40 mg total) by mouth every evening. NEED OV. 07/15/17  Yes Lendon Colonel, NP  bismuth-metronidazole-tetracycline [facility]) [phone] MG capsule Take 3 capsules by mouth 4 (four) times daily -  before meals and at bedtime. Patient not taking: Reported on 11/08/2017 10/11/17   Mauri Pole, MD  Blood Glucose Monitoring Suppl (ONE TOUCH ULTRA 2) w/Device KIT Use to check blood sugars twice a day Dx E11.9 Patient not taking: Reported on 11/08/2017 10/05/17   Biagio Borg, MD  glucose blood (ONE TOUCH ULTRA TEST) test strip 1 each by Other route 2 (two) times daily. Use to check blood sugar two times a day. Dx E11.9 Patient not taking: Reported on 11/08/2017 10/05/17   Biagio Borg, MD  Lancets [facility] ULTRASOFT) lancets 1 each by Other route 4 (four) times daily. Use to help  check blood sugars four times a day Dx E11.9 Patient not taking: Reported on 11/08/2017 07/03/15   Biagio Borg, MD  Lancets [facility] ULTRASOFT) lancets Use to help check blood sugars twice a day Patient not taking: Reported on 11/08/2017 10/05/17   Biagio Borg, MD  omeprazole (PRILOSEC) 40 MG capsule Take 1 capsule (40 mg total) by mouth 2 (two) times daily. Patient not taking: Reported on 11/08/2017 10/11/17   Mauri Pole, MD  Vitamin D, Ergocalciferol, (DRISDOL) 50000 units CAPS capsule Take 1 capsule (50,000 Units total) by mouth every 7 (seven) days. Patient not taking: Reported on 11/08/2017 07/15/17   Biagio Borg, MD    Physical Exam:  Vitals:   11/09/17 1330 11/09/17 1340  BP: (!) 183/47 (!) 198/67  Pulse: 68 80  Resp: 18 20  Temp:    SpO2: (!) 88% (!) 80%     Frail cachectic Caucasian female no distress arcus senilis neck soft supple no lymphadenopathy  S1-S2 tachycardic  Bilateral rhonchi rales    Abdomen soft nontender nondistended can raise both legs off bed  Grip strength 5/5  Neurologically slightly confused  I have personally reviewed following labs and imaging studies  Labs:   ABG shows FiO2 100 pH 7.39 PCO2 44, PaO2 52.8  No WBC, no C met  Imaging studies:   pending  Medical tests:   EKG independently reviewed: pending    Test discussed with performing physician:  y    Decision to obtain old records:   y   Review and summation of old records:   y   Active Problems:   * No active hospital problems. *   Assessment/Plan Acute hypoxic respiratory failure-probably secondary to multiple components x-ray is not specific and may have components of aspiration and fluid I will treat both-get PCT and lactic acid to differentiate Giving Lasix and Unasyn DuoNeb x1 Blood gas shows decompensation from baseline with a PaO2 of 50-needs BiPAP-critical care consulted  Multiple polyps-repeat labs a.m.-diet as per GI once comes off  BiPAP  History of diastolic heart failure-?-Unclear if patient  took her Lasix this morning she takes Lasix 20 daily we will give her a one-time dose of 60 and continue every 12-repeat a.m. labs-get BNP  DM TY 2-monitor sugars every 4 if on BiPAP if can be taken off BiPAP then placed 4 times daily AC at bedtime  Possible pseudodementia versus dementia-outpatient follow-up Dr. Jenny Reichmann  Adult failure to thrive in a former smoker with lung nodules-she is also had lung cancer and bladder cancer-may need outpatient goals of care  Severity of Illness: The appropriate patient status for this patient is INPATIENT. Inpatient status is judged to be reasonable and necessary in order to provide the required intensity of service to ensure the patient's safety. The patient's presenting symptoms, physical exam findings, and initial radiographic and laboratory data in the context of their chronic comorbidities is felt to place them at high risk for further clinical deterioration. Furthermore, it is not anticipated that the patient will be medically stable for discharge from the hospital within 2 midnights of admission. The following factors support the patient status of inpatient.   " The patient's presenting symptoms include acute hypoxemic respiratory failure. " The worrisome physical exam findings include acute shortness of breath. " The initial radiographic and laboratory data are worrisome because of pneumonia versus heart failure. " The chronic co-morbidities include prior lung cancer and other cancers and former smoker in a patient with mild dementia.   * I certify that at the point of admission it is my clinical judgment that the patient will require inpatient hospital care spanning beyond 2 midnights from the point of admission due to high intensity of service, high risk for further deterioration and high frequency of surveillance required.*  Lovenox, full code, inpatient, consulted critical care Dr. Valeta Harms  who will see the patient in consult  Time spent: 62 minutes  Linnea Todisco, MD  [facility] Direct contact: [phone] --[address] [place]  --[email or web address]; password [email or web address]  7PM-7AM contact night coverage as above  11/09/2017, 2:07 PM

## 2017-11-09 NOTE — Progress Notes (Signed)
Pt placed on CPAP per MD order. Pt on CPAP with 15 LPM bleed in due to low sats.

## 2017-11-09 NOTE — Anesthesia Procedure Notes (Signed)
Procedure Name: MAC Date/Time: 11/09/2017 10:17 AM Performed by: Dione Booze, CRNA Pre-anesthesia Checklist: Patient identified, Emergency Drugs available, Suction available and Patient being monitored Patient Re-evaluated:Patient Re-evaluated prior to induction Oxygen Delivery Method: Simple face mask Placement Confirmation: positive ETCO2

## 2017-11-09 NOTE — Progress Notes (Signed)
Pharmacy Antibiotic Note  Tonya Valdez is a 77 y.o. female admitted on 11/09/2017 with aspiration PNA.  Pharmacy has been consulted for Unasyn dosing.  Plan: Unasyn 3 gm IV q8h F/u renal fxn, WBC, temp, culture data  Height: 5\' 2"  (157.5 cm) Weight: 91 lb 14.9 oz (41.7 kg) IBW/kg (Calculated) : 50.1  Temp (24hrs), Avg:97.9 F (36.6 C), Min:97.9 F (36.6 C), Max:97.9 F (36.6 C)  No results for input(s): WBC, CREATININE, LATICACIDVEN, VANCOTROUGH, VANCOPEAK, VANCORANDOM, GENTTROUGH, GENTPEAK, GENTRANDOM, TOBRATROUGH, TOBRAPEAK, TOBRARND, AMIKACINPEAK, AMIKACINTROU, AMIKACIN in the last 168 hours.  CrCl cannot be calculated (Patient's most recent lab result is older than the maximum 21 days allowed.).    No Known Allergies  Antimicrobials this admission: 9/17 Unasyn>>  Dose adjustments this admission:  Microbiology results:  Thank you for allowing pharmacy to be a part of this patient's care.  Eudelia Bunch, Pharm.D [phone] 11/09/2017 2:57 PM

## 2017-11-09 NOTE — Anesthesia Preprocedure Evaluation (Signed)
Anesthesia Evaluation  Patient identified by MRN, date of birth, ID band Patient awake    Reviewed: Allergy & Precautions, NPO status , Patient's Chart, lab work & pertinent test results  History of Anesthesia Complications Negative for: history of anesthetic complications  Airway Mallampati: II  TM Distance: >3 FB Neck ROM: Full    Dental  (+) Lower Dentures, Upper Dentures   Pulmonary COPD, former smoker,    Pulmonary exam normal        Cardiovascular hypertension, + CAD, + Cardiac Stents, + CABG, + Peripheral Vascular Disease and +CHF  Normal cardiovascular exam  Hx reduced EF but most recently recovered to 55% (June 2019)   Neuro/Psych Carotid artery stenosis s/p CEA negative psych ROS   GI/Hepatic Neg liver ROS, hiatal hernia,   Endo/Other  diabetes  Renal/GU negative Renal ROS     Musculoskeletal negative musculoskeletal ROS (+)   Abdominal   Peds  Hematology negative hematology ROS (+)   Anesthesia Other Findings   Reproductive/Obstetrics negative OB ROS                             Anesthesia Physical Anesthesia Plan  ASA: III  Anesthesia Plan: MAC   Post-op Pain Management:    Induction:   PONV Risk Score and Plan: 2 and Propofol infusion and Treatment may vary due to age or medical condition  Airway Management Planned: Natural Airway and Nasal Cannula  Additional Equipment:   Intra-op Plan:   Post-operative Plan:   Informed Consent: I have reviewed the patients History and Physical, chart, labs and discussed the procedure including the risks, benefits and alternatives for the proposed anesthesia with the patient or authorized representative who has indicated his/her understanding and acceptance.     Plan Discussed with:   Anesthesia Plan Comments:         Anesthesia Quick Evaluation

## 2017-11-09 NOTE — Telephone Encounter (Signed)
Copied from [place] [id number]. Topic: Quick Communication - See Telephone Encounter >> Nov 09, 2017  4:24 PM Berneta Levins wrote: CRM for notification. See Telephone encounter for: 11/09/17.  Pt's daughter - Tye [place] - took her in for colonoscopy today and pt has been admitted to the hospital - pt is currently in the step down ICU at [facility].  Pt's daughter states another physician came in and examined her and that she had been experiencing dementia lately, listless, etc.   Pt's daughter would like to know if Dr. Jenny Reichmann can see pt at the hospital.  Pt states that Dr. Juliann Mule did the colonoscopy and was the one that recommended that pt be admitted and kept overnight.  Pt's daughter states another doctor came in and asked pt if she wanted to put a DNR in place.  Pt's daughter states that she is not sure that pt understands what a DNR was at that time, etc. Pt's daughter states that the doctor on call states that "they shouldn't get their hopes up".  Pt's daughter is also sole caregiver of her husband who has Parkinsons.  Pt's daughter, Tye [place], is just trying to figure out what is going on. Tye Maryland can be reached at [phone].

## 2017-11-09 NOTE — Transfer of Care (Signed)
Immediate Anesthesia Transfer of Care Note  Patient: Tonya Valdez  Procedure(s) Performed: COLONOSCOPY WITH PROPOFOL (N/A ) POLYPECTOMY SUBMUCOSAL INJECTION  Patient Location: PACU and Endoscopy Unit  Anesthesia Type:MAC  Level of Consciousness: awake, drowsy and patient cooperative  Airway & Oxygen Therapy: Patient Spontanous Breathing and Patient connected to face mask oxygen  Post-op Assessment: Report given to RN and Post -op Vital signs reviewed and stable  Post vital signs: Reviewed and stable  Last Vitals:  Vitals Value Taken Time  BP 163/75 11/09/2017 12:30 PM  Temp    Pulse 77 11/09/2017 12:31 PM  Resp 19 11/09/2017 12:31 PM  SpO2 79 % 11/09/2017 12:31 PM  Vitals shown include unvalidated device data.  Last Pain:  Vitals:   11/09/17 0955  TempSrc: Oral  PainSc: 0-No pain         Complications: No apparent anesthesia complications

## 2017-11-09 NOTE — Progress Notes (Signed)
CRITICAL VALUE ALERT  Critical Value:  LA-2.9,  Troponin0.03  Date & Time Notied:  9311  11/09/17   Provider Notified: Yes  Orders Received/Actions taken:

## 2017-11-09 NOTE — Consult Note (Signed)
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 Colonoscopy findings included multiple polyps requiring prolonged sedation (propofol utilized for case).   Approximately 1 hour spent trying to remove polyps. She was noted to by hypertensive, hypoxic requiring 100% FiO2 post procedure.  There were concerns for possible aspiration.  CXR post procedure showed bilateral small pleural effusions and atelectasis in the RUL. She was returned to ICU on BiPAP.  Hypertensive to 546'T systolic.   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 Patient reports she quit smoking in July 2019.  She reports ~ 30 lb weight loss in the last few months & decreased appetite.  Denies night sweats.  She reports she is independent of all ADL's but does require some help with her house work.  Continues to drive.  Has daily sputum production but no increase in quantity or color change.   PCCM consulted for evaluation of hypoxic respiratory failure.    Significant Hospital Events   9/17  Admit   Consults: date of consult/date signed off & final recs:  PCCM 9/17   Procedures (surgical and bedside):  9/17  Colonoscopy >> multiple polyps   Significant Diagnostic Tests:    Micro Data:   Antimicrobials:     Subjective:  RN reports pt hypertensive, on bipap 100% FiO2. Afebrile.    Objective   Blood pressure (!) 198/67, pulse 80, temperature 97.9 F (36.6 C), temperature source Oral, resp. rate 20, height 5' 2"  (1.575 m), weight 41.7 kg, SpO2 (!) 80 %.        Intake/Output Summary (Last 24 hours) at 11/09/2017 1454 Last data filed at 11/09/2017 1221 Gross per 24 hour  Intake 500  ml  Output -  Net 500 ml   Filed Weights   11/09/17 0955  Weight: 41.7 kg    Examination: General: frail elderly female lying in bed on BiPAP  HENT: BiPAP in place, MM pink/dry Lungs: even/non-labored, lungs bilaterally with coarse rhonchi, wheezing, bibasilar crackles  Cardiovascular: s1s2 rrr, tachy, no m/r/g Abdomen: soft/non-tender, bsx4 active  Extremities: warm/dry, no edema  Neuro: Awake, alert, appropriate, MAE   Resolved Hospital Problem list     Assessment & Plan:   Acute Hypoxemic Respiratory Failure - suspect multifactorial in the setting of underlying pulmonary disease, lung cancer/COPD, diastolic dysfunction, profound hypertension & possible aspiration event.   P: BiPAP support PRN for increased work of breathing  Wean O2 for sats 88-94% Pulmonary hygiene- moblize, IS Solu-medrol 40 mg IV Q12, wean as tolerated Move lasix up, now dose Duoneb Q4 Consider CTA if remains hypoxic out of proportion to lung disease with above interventions   Rule Out Aspiration  P: Unasyn empirically for aspiration    Hypertensive Urgency - rule out crisis  Hx Diastolic Dysfunction P: Assess CMP now  Resume home medications  Monitor BP in ICU  Add PRN hydralazine for MAP > 170   Disposition / Summary of Today's Plan 11/09/17   SDU, BiPAP support.  Correct BP.  Add pulmonary regimen.      Diet: NPOx meds  Pain/Anxiety/Delirium protocol (if indicated): n/a VAP protocol (if indicated) n/a  DVT prophylaxis: SCD's  GI prophylaxis: n/a Hyperglycemia  protocol: n/a Mobility: as tolerated  Code Status: Full Code  Family Communication: No family at bedside.  Patient updated on plan of care.   Labs   CBC: No results for input(s): WBC, NEUTROABS, HGB, HCT, MCV, PLT in the last 168 hours.   Basic Metabolic Panel: No results for input(s): NA, K, CL, CO2, GLUCOSE, BUN, CREATININE, CALCIUM, MG, PHOS in the last 168 hours. GFR: CrCl cannot be calculated (Patient's most  recent lab result is older than the maximum 21 days allowed.). No results for input(s): PROCALCITON, WBC, LATICACIDVEN in the last 168 hours.   Liver Function Tests: No results for input(s): AST, ALT, ALKPHOS, BILITOT, PROT, ALBUMIN in the last 168 hours. No results for input(s): LIPASE, AMYLASE in the last 168 hours. No results for input(s): AMMONIA in the last 168 hours.   ABG    Component Value Date/Time   PHART 7.398 11/09/2017 1347   PCO2ART 44.0 11/09/2017 1347   PO2ART 52.8 (L) 11/09/2017 1347   HCO3 26.6 11/09/2017 1347   TCO2 23 11/15/2012 1705   ACIDBASEDEF 4.0 (H) 11/14/2012 1956   O2SAT 82.6 11/09/2017 1347    Coagulation Profile: No results for input(s): INR, PROTIME in the last 168 hours.   Cardiac Enzymes: No results for input(s): CKTOTAL, CKMB, CKMBINDEX, TROPONINI in the last 168 hours.   HbA1C: Hgb A1c MFr Bld  Date/Time Value Ref Range Status  07/15/2017 10:07 AM 7.0 (H) 4.6 - 6.5 % Final    Comment:    Glycemic Control Guidelines for People with Diabetes:Non Diabetic:  <6%Goal of Therapy: <7%Additional Action Suggested:  >8%   05/06/2016 02:12 PM 6.8 (H) 4.6 - 6.5 % Final    Comment:    Glycemic Control Guidelines for People with Diabetes:Non Diabetic:  <6%Goal of Therapy: <7%Additional Action Suggested:  >8%    CBG: Recent Labs  Lab 11/09/17 1004  GLUCAP 112*     Review of Systems: POSITIVES IN BOLD  Gen: Denies fever, chills, weight change, fatigue, night sweats HEENT: Denies blurred vision, double vision, hearing loss, tinnitus, sinus congestion, rhinorrhea, sore throat, neck stiffness, dysphagia PULM: Denies shortness of breath, cough, sputum production, hemoptysis, wheezing CV: Denies chest pain, edema, orthopnea, paroxysmal nocturnal dyspnea, palpitations GI: Denies abdominal pain, nausea, vomiting, diarrhea, hematochezia, melena, constipation, change in bowel habits GU: Denies dysuria, hematuria, polyuria, oliguria, urethral  discharge Endocrine: Denies hot or cold intolerance, polyuria, polyphagia or appetite change Derm: Denies rash, dry skin, scaling or peeling skin change Heme: Denies easy bruising, bleeding, bleeding gums Neuro: Denies headache, numbness, weakness, slurred speech, loss of memory or consciousness   Past medical history  She,  has a past medical history of Bilateral carotid artery disease ([place]) (09/26/2012), Bladder cancer ([place]) (04/26/2013), Bladder tumor, CAD (coronary artery disease), Cancer ([place]), Cataract, Chronic systolic CHF (congestive heart failure) ([place]) (08/04/2012), COPD (chronic obstructive pulmonary disease) ([place]), Diabetes mellitus ([place]) (PCP DR Cathlean Cower), H/O hiatal hernia, Hyperkalemia, Hyperlipidemia, Hypertension, Ischemic cardiomyopathy, Lung cancer ([place]), Lung nodule, PAD (peripheral artery disease) ([place]), Positive TB test, Radiation (4/26,4/28,5/3,5/5,5/10), and Wears glasses.   Surgical History    Past Surgical History:  Procedure Laterality Date  . ABDOMINAL HYSTERECTOMY  1981 (APPROX)   BILATERAL SALPINGO-OOPHORECTOMY WITH EXCEPTION A SMALL PIECE OF OVARY REMAINS  . APPENDECTOMY    . CARDIAC CATHETERIZATION    . CHOLECYSTECTOMY    . COLONOSCOPY    . CORONARY ARTERY BYPASS GRAFT N/A 11/14/2012   Procedure: CORONARY ARTERY BYPASS GRAFTING (CABG) times three using left internal mammary  artery and left saphenous vein;  Surgeon: Gaye Pollack, MD; L-LAD, S-OM, S-PDA   . ENDARTERECTOMY Right 11/14/2012   Procedure: RIGHT ENDARTERECTOMY CAROTID with patch angioplasty.;  Surgeon: Rosetta Posner, MD;  Location: [facility];  Service: Vascular;  Laterality: Right;  . ENDARTERECTOMY Left 11/08/2015   Procedure: ENDARTERECTOMY CAROTID WITH PATCH ANGIOPLASTY;  Surgeon: Rosetta Posner, MD;  Location: [facility];  Service: Vascular;  Laterality: Left;  . HAMMER TOE SURGERY Left   . HIATAL HERNIA REPAIR  1964  (AGE 23)   CHOLECYSTECTOMY AND APPENDECTOMY  . INTRAOPERATIVE TRANSESOPHAGEAL  ECHOCARDIOGRAM N/A 11/14/2012   Procedure: INTRAOPERATIVE TRANSESOPHAGEAL ECHOCARDIOGRAM;  Surgeon: Gaye Pollack, MD;  Location: [facility] OR;  Service: Open Heart Surgery;  Laterality: N/A;  . LEFT ELBOW SURGERY    . [place] SURGERY  1993  . TONSILLECTOMY  1959  . TONSILLECTOMY    . TRANSURETHRAL RESECTION OF BLADDER TUMOR N/A 06/24/2012   Procedure: TRANSURETHRAL RESECTION OF BLADDER TUMOR  WITH GYRUS AND INSTILLATION OF MITOMYCIN C  (TURBT);  Surgeon: Claybon Jabs, MD;  Location: [facility];  Service: Urology;  Laterality: N/A;  . WRIST SURGERY Left      Social History   Social History   Socioeconomic History  . Marital status: Widowed    Spouse name: Not on file  . Number of children: 2  . Years of education: 12  . Highest education level: Not on file  Occupational History  . Occupation: Therapist, sports, Multimedia programmer: [employer]  . Financial resource strain: Not on file  . Food insecurity:    Worry: Not on file    Inability: Not on file  . Transportation needs:    Medical: Not on file    Non-medical: Not on file  Tobacco Use  . Smoking status: Former Smoker    Packs/day: 1.00    Years: 50.00    Pack years: 50.00    Types: Cigarettes    Last attempt to quit: 04/23/2013    Years since quitting: 4.5  . Smokeless tobacco: Never Used  . Tobacco comment: today pt says she smokes occassionally  Substance and Sexual Activity  . Alcohol use: No    Alcohol/week: 0.0 standard drinks    Comment: occasional wine not often  . Drug use: No  . Sexual activity: Not Currently  Lifestyle  . Physical activity:    Days per week: Not on file    Minutes per session: Not on file  . Stress: Not on file  Relationships  . Social connections:    Talks on phone: Not on file    Gets together: Not on file    Attends religious service: Not on file    Active member of club or organization: Not on file    Attends meetings of clubs or organizations: Not on file     Relationship status: Not on file  . Intimate partner violence:    Fear of current or ex partner: Not on file    Emotionally abused: Not on file    Physically abused: Not on file    Forced sexual activity: Not on file  Other Topics Concern  . Not on file  Social History Narrative  . Not on file  ,  reports that she quit smoking about 4 years ago. Her smoking use included cigarettes. She has a 50.00 pack-year smoking history. She has never used smokeless tobacco. She reports that she does not  drink alcohol or use drugs.   Family history   Her family history includes Cancer in her father, mother, and other; Cervical cancer in her mother; Colon cancer in her father; Diabetes in her mother and other; Heart disease in her mother; Hypertension in her father, mother, and other; Kidney disease in her other; Lung cancer (age of onset: 83) in her father.   Allergies No Known Allergies  Home meds  Prior to Admission medications   Medication Sig Start Date End Date Taking? Authorizing Provider  aspirin EC 81 MG tablet Take 81 mg by mouth daily.   Yes [provider]  carvedilol (COREG) 12.5 MG tablet Take 1 & 1/2 tablets by mouth twice a day with a meal Patient taking differently: Take 18.75 mg by mouth 2 (two) times daily with a meal.  07/15/17  Yes Lendon Colonel, NP  ferrous sulfate (SLOW IRON) 160 (50 Fe) MG TBCR SR tablet Take 1 tablet (160 mg total) by mouth daily. 07/15/17  Yes Biagio Borg, MD  furosemide (LASIX) 20 MG tablet Take 1 tablet (20 mg total) by mouth daily. NEED OV. 07/15/17  Yes Lendon Colonel, NP  hydrALAZINE (APRESOLINE) 25 MG tablet Take 1 tablet (25 mg total) by mouth 3 (three) times daily. NEED OV. 07/15/17  Yes Lendon Colonel, NP  Hypromellose (ARTIFICIAL TEARS OP) Place 1 drop into both eyes 4 (four) times daily as needed (for dry eyes).   Yes [provider]  isosorbide mononitrate (IMDUR) 30 MG 24 hr tablet Take 1 tablet (30 mg total) by mouth  daily. 07/15/17  Yes Lendon Colonel, NP  metFORMIN (GLUCOPHAGE) 500 MG tablet Take 1 tablet by mouth twice a day with a meal Patient taking differently: Take 500 mg by mouth 2 (two) times daily with a meal.  07/15/17  Yes Biagio Borg, MD  Multiple Vitamin (MULTIVITAMIN) tablet Take 1 tablet by mouth daily.   Yes [provider]  Omega-3 Fatty Acids (FISH OIL) 1200 MG CAPS Take 1,200 mg by mouth daily.    Yes [provider]  pravastatin (PRAVACHOL) 40 MG tablet Take 1 tablet (40 mg total) by mouth every evening. NEED OV. 07/15/17  Yes Lendon Colonel, NP  bismuth-metronidazole-tetracycline [facility]) [phone] MG capsule Take 3 capsules by mouth 4 (four) times daily -  before meals and at bedtime. Patient not taking: Reported on 11/08/2017 10/11/17   Mauri Pole, MD  Blood Glucose Monitoring Suppl (ONE TOUCH ULTRA 2) w/Device KIT Use to check blood sugars twice a day Dx E11.9 Patient not taking: Reported on 11/08/2017 10/05/17   Biagio Borg, MD  glucose blood (ONE TOUCH ULTRA TEST) test strip 1 each by Other route 2 (two) times daily. Use to check blood sugar two times a day. Dx E11.9 Patient not taking: Reported on 11/08/2017 10/05/17   Biagio Borg, MD  Lancets [facility] ULTRASOFT) lancets 1 each by Other route 4 (four) times daily. Use to help check blood sugars four times a day Dx E11.9 Patient not taking: Reported on 11/08/2017 07/03/15   Biagio Borg, MD  Lancets [facility] ULTRASOFT) lancets Use to help check blood sugars twice a day Patient not taking: Reported on 11/08/2017 10/05/17   Biagio Borg, MD  omeprazole (PRILOSEC) 40 MG capsule Take 1 capsule (40 mg total) by mouth 2 (two) times daily. Patient not taking: Reported on 11/08/2017 10/11/17   Mauri Pole, MD  Vitamin D, Ergocalciferol, (DRISDOL) 50000 units CAPS  capsule Take 1 capsule (50,000 Units total) by mouth every 7 (seven) days. Patient not taking: Reported on 11/08/2017 07/15/17   Biagio Borg, MD     Noe Gens, NP-C [facility] Pgr: [phone] or if no answer [phone] 11/09/2017, 2:54 PM

## 2017-11-09 NOTE — Telephone Encounter (Signed)
Very sorry, but it is not my place to intervene, as this can cause more problems than it solved;  Please ask Tonya Valdez to direct questions to the attending MD, and maybe ask for palliative care consult (if not already done), thanks

## 2017-11-09 NOTE — Progress Notes (Signed)
CRITICAL VALUE ALERT  Critical Value: Troponin 0.58  Date & Time Notified:  11/09/2017 @2002   Provider Notified: Yes  Orders Received/Actions taken: STAT EKG, 150MG  aspirin suppository to be given.

## 2017-11-09 NOTE — Op Note (Signed)
[facility] Patient Name: Tonya Valdez Procedure Date: 11/09/2017 MRN: [id number] Attending MD: Mauri Pole , MD Date of Birth: 11/09/1940 CSN: [id number] Age: 77 Admit Type: Outpatient Procedure:                Colonoscopy Indications:              Therapeutic procedure for colon polyps Providers:                Mauri Pole, MD, Carolynn Comment RN, RN,                            Charolette Child, Technician, Dione Booze, CRNA Referring MD:              Medicines:                Monitored Anesthesia Care Complications:            No immediate complications. Estimated Blood Loss:     Estimated blood loss was minimal. Procedure:                Pre-Anesthesia Assessment:                           - Prior to the procedure, a History and Physical                            was performed, and patient medications and                            allergies were reviewed. The patient's tolerance of                            previous anesthesia was also reviewed. The risks                            and benefits of the procedure and the sedation                            options and risks were discussed with the patient.                            All questions were answered, and informed consent                            was obtained. Prior Anticoagulants: The patient has                            taken no previous anticoagulant or antiplatelet                            agents. ASA Grade Assessment: III - A patient with                            severe systemic disease. After reviewing the risks  and benefits, the patient was deemed in                            satisfactory condition to undergo the procedure.                           After obtaining informed consent, the colonoscope                            was passed under direct vision. Throughout the                            procedure, the patient's blood pressure,  pulse, and                            oxygen saturations were monitored continuously. The                            [id number]-H190DL ([id number] Olympus peds colonoscope was                            introduced through the anus and advanced to the the                            terminal ileum, with identification of the                            appendiceal orifice and IC valve. The colonoscopy                            was somewhat difficult due to restricted mobility                            of the colon, the patient's body habitus and the                            patient's oxygen desaturation. Successful                            completion of the procedure was aided by performing                            chin lift and administering oxygen by anaesthesia                            team. The patient tolerated the procedure well. Scope In: 10:22:24 AM Scope Out: 12:19:48 PM Scope Withdrawal Time: 1 hour 48 minutes 0 seconds  Total Procedure Duration: 1 hour 57 minutes 24 seconds  Findings:      The perianal and digital rectal examinations were normal.      Three carpet-like polyps were found in the splenic flexure, hepatic       flexure and cecum. The polyps were 15 to 25 mm in size. Preparations       were made for mucosal  resection. Eleview was injected to raise the       lesion. Piecemeal mucosal resection using a snare was performed.       Resection and retrieval were complete.      13 sessile polyps were found in the rectum, transverse colon and hepatic       flexure. The polyps were 12 to 18 mm in size. These polyps were removed       with a hot snare. Resection and retrieval were complete.      Three sessile polyps were found in the sigmoid colon, transverse colon       and hepatic flexure. The polyps were 5 to 7 mm in size. These polyps       were removed with a cold snare. Resection and retrieval were complete.      A 2 mm polyp was found in the transverse colon. The polyp  was sessile.       The polyp was removed with a cold biopsy forceps. Resection and       retrieval were complete.      Scattered small-mouthed diverticula were found in the sigmoid colon and       descending colon.      Non-bleeding internal hemorrhoids were found during retroflexion. The       hemorrhoids were small.      A 25 mm polyp was found in the splenic flexure. The polyp was       multi-lobulated. Preparations were made for mucosal resection. Eleview       was injected to raise the lesion. Piecemeal mucosal resection using a       snare was performed. Resection was incomplete as procedure was aborted       due to oxygen desaturation. The resected tissue was retrieved. Impression:               - Three 15 to 25 mm polyps at the splenic flexure,                            at the hepatic flexure and in the cecum, removed                            with mucosal resection. Resected and retrieved.                           - 13 12 to 18 mm polyps in the rectum, in the                            transverse colon and at the hepatic flexure,                            removed with a hot snare. Resected and retrieved.                           - Three 5 to 7 mm polyps in the sigmoid colon, in                            the transverse colon and at the hepatic flexure,  removed with a cold snare. Resected and retrieved.                           - One 2 mm polyp in the transverse colon, removed                            with a cold biopsy forceps. Resected and retrieved.                           - Diverticulosis in the sigmoid colon and in the                            descending colon.                           - Non-bleeding internal hemorrhoids.                           - One 25 mm polyp at the splenic flexure, removed                            with mucosal resection. Incomplete resection.                            Resected tissue retrieved.                            - Mucosal resection was performed. Resection and                            retrieval were complete.                           - Mucosal resection was performed. Resection was                            incomplete. The resected tissue was retrieved. Moderate Sedation:      N/A Recommendation:           - Patient has a contact number available for                            emergencies. The signs and symptoms of potential                            delayed complications were discussed with the                            patient. Return to normal activities tomorrow.                            Written discharge instructions were provided to the                            patient.                           -  Resume previous diet.                           - Continue present medications.                           - Await pathology results.                           - Repeat colonoscopy date to be determined after                            pending pathology results are reviewed for                            surveillance after piecemeal polypectomy. Procedure Code(s):        --- Professional ---                           [phone], 22,59, Colonoscopy, flexible; with                            endoscopic mucosal resection                           [phone], Colonoscopy, flexible; with removal of                            tumor(s), polyp(s), or other lesion(s) by snare                            technique                           [id number], 34, Colonoscopy, flexible; with biopsy,                            single or multiple Diagnosis Code(s):        --- Professional ---                           D12.0, Benign neoplasm of cecum                           K62.1, Rectal polyp                           D12.5, Benign neoplasm of sigmoid colon                           D12.3, Benign neoplasm of transverse colon (hepatic                            flexure or splenic flexure)                            K64.8, Other hemorrhoids  K63.5, Polyp of colon                           K57.30, Diverticulosis of large intestine without                            perforation or abscess without bleeding CPT copyright 2017 [employer]. All rights reserved. The codes documented in this report are preliminary and upon coder review may  be revised to meet current compliance requirements. Mauri Pole, MD 11/09/2017 12:37:32 PM This report has been signed electronically. Number of Addenda: 0

## 2017-11-10 ENCOUNTER — Inpatient Hospital Stay (HOSPITAL_COMMUNITY): Payer: PPO

## 2017-11-10 DIAGNOSIS — I739 Peripheral vascular disease, unspecified: Secondary | ICD-10-CM

## 2017-11-10 DIAGNOSIS — R748 Abnormal levels of other serum enzymes: Secondary | ICD-10-CM

## 2017-11-10 LAB — GLUCOSE, CAPILLARY
GLUCOSE-CAPILLARY: 171 mg/dL — AB (ref 70–99)
GLUCOSE-CAPILLARY: 330 mg/dL — AB (ref 70–99)
Glucose-Capillary: 103 mg/dL — ABNORMAL HIGH (ref 70–99)
Glucose-Capillary: 107 mg/dL — ABNORMAL HIGH (ref 70–99)
Glucose-Capillary: 220 mg/dL — ABNORMAL HIGH (ref 70–99)
Glucose-Capillary: 277 mg/dL — ABNORMAL HIGH (ref 70–99)
Glucose-Capillary: 98 mg/dL (ref 70–99)

## 2017-11-10 LAB — COMPREHENSIVE METABOLIC PANEL
ALK PHOS: 49 U/L (ref 38–126)
ALT: 13 U/L (ref 0–44)
AST: 24 U/L (ref 15–41)
Albumin: 1.8 g/dL — ABNORMAL LOW (ref 3.5–5.0)
Anion gap: 12 (ref 5–15)
BILIRUBIN TOTAL: 0.7 mg/dL (ref 0.3–1.2)
BUN: 15 mg/dL (ref 8–23)
CHLORIDE: 102 mmol/L (ref 98–111)
CO2: 26 mmol/L (ref 22–32)
CREATININE: 1.21 mg/dL — AB (ref 0.44–1.00)
Calcium: 8 mg/dL — ABNORMAL LOW (ref 8.9–10.3)
GFR calc Af Amer: 48 mL/min — ABNORMAL LOW (ref >60)
GFR, EST NON AFRICAN AMERICAN: 42 mL/min — AB (ref >60)
Glucose, Bld: 113 mg/dL — ABNORMAL HIGH (ref 70–99)
Potassium: 2.8 mmol/L — ABNORMAL LOW (ref 3.5–5.1)
Sodium: 140 mmol/L (ref 135–145)
Total Protein: 5.6 g/dL — ABNORMAL LOW (ref 6.5–8.1)

## 2017-11-10 LAB — BASIC METABOLIC PANEL
Anion gap: 11 (ref 5–15)
BUN: 21 mg/dL (ref 8–23)
CHLORIDE: 103 mmol/L (ref 98–111)
CO2: 26 mmol/L (ref 22–32)
Calcium: 8 mg/dL — ABNORMAL LOW (ref 8.9–10.3)
Creatinine, Ser: 1.29 mg/dL — ABNORMAL HIGH (ref 0.44–1.00)
GFR calc non Af Amer: 39 mL/min — ABNORMAL LOW (ref >60)
GFR, EST AFRICAN AMERICAN: 45 mL/min — AB (ref >60)
Glucose, Bld: 235 mg/dL — ABNORMAL HIGH (ref 70–99)
POTASSIUM: 4.1 mmol/L (ref 3.5–5.1)
Sodium: 140 mmol/L (ref 135–145)

## 2017-11-10 LAB — CBC
HCT: 23.9 % — ABNORMAL LOW (ref 36.0–46.0)
Hemoglobin: 7.5 g/dL — ABNORMAL LOW (ref 12.0–15.0)
MCH: 26 pg (ref 26.0–34.0)
MCHC: 31.4 g/dL (ref 30.0–36.0)
MCV: 82.7 fL (ref 78.0–100.0)
PLATELETS: 374 10*3/uL (ref 150–400)
RBC: 2.89 MIL/uL — ABNORMAL LOW (ref 3.87–5.11)
RDW: 16 % — AB (ref 11.5–15.5)
WBC: 21.7 10*3/uL — AB (ref 4.0–10.5)

## 2017-11-10 LAB — TROPONIN I
TROPONIN I: 1.03 ng/mL — AB (ref <0.03)
TROPONIN I: 0.49 ng/mL — AB (ref <0.03)
Troponin I: 0.31 ng/mL (ref <0.03)

## 2017-11-10 LAB — MAGNESIUM: MAGNESIUM: 1.1 mg/dL — AB (ref 1.7–2.4)

## 2017-11-10 LAB — PROCALCITONIN: Procalcitonin: 4.97 ng/mL

## 2017-11-10 MED ORDER — SODIUM CHLORIDE 0.9 % IV SOLN
3.0000 g | Freq: Two times a day (BID) | INTRAVENOUS | Status: DC
  Administered 2017-11-10 – 2017-11-12 (×4): 3 g via INTRAVENOUS
  Filled 2017-11-10 (×5): qty 3

## 2017-11-10 MED ORDER — POTASSIUM CHLORIDE 10 MEQ/100ML IV SOLN
10.0000 meq | INTRAVENOUS | Status: AC
  Administered 2017-11-10 (×6): 10 meq via INTRAVENOUS
  Filled 2017-11-10 (×6): qty 100

## 2017-11-10 MED ORDER — ASPIRIN EC 81 MG PO TBEC: 81.0000 mg | DELAYED_RELEASE_TABLET | Freq: Every day | ORAL | Status: DC

## 2017-11-10 MED ORDER — ASPIRIN EC 81 MG PO TBEC
81.0000 mg | DELAYED_RELEASE_TABLET | Freq: Every day | ORAL | Status: DC
  Administered 2017-11-11 – 2017-11-12 (×2): 81 mg via ORAL
  Filled 2017-11-10 (×3): qty 1

## 2017-11-10 MED ORDER — POTASSIUM CHLORIDE CRYS ER 20 MEQ PO TBCR
40.0000 meq | EXTENDED_RELEASE_TABLET | ORAL | Status: AC
  Administered 2017-11-10 (×2): 40 meq via ORAL
  Filled 2017-11-10 (×2): qty 2

## 2017-11-10 MED ORDER — INSULIN ASPART 100 UNIT/ML ~~LOC~~ SOLN
0.0000 [IU] | Freq: Three times a day (TID) | SUBCUTANEOUS | Status: DC
  Administered 2017-11-10: 2 [IU] via SUBCUTANEOUS
  Administered 2017-11-10: 3 [IU] via SUBCUTANEOUS
  Administered 2017-11-11: 2 [IU] via SUBCUTANEOUS
  Administered 2017-11-11: 5 [IU] via SUBCUTANEOUS
  Administered 2017-11-11: 7 [IU] via SUBCUTANEOUS
  Administered 2017-11-12 (×2): 2 [IU] via SUBCUTANEOUS

## 2017-11-10 NOTE — Progress Notes (Signed)
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 Colonoscopy findings included multiple polyps requiring prolonged sedation (propofol utilized for case).   Approximately 1 hour spent trying to remove polyps. She was noted to by hypertensive, hypoxic requiring 100% FiO2 post procedure.  There were concerns for possible aspiration.  CXR post procedure showed bilateral small pleural effusions and atelectasis in the RUL. She was returned to ICU on BiPAP.  Hypertensive to 627'O systolic.   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 Patient reports she quit smoking in July 2019.  She reports ~ 30 lb weight loss in the last few months & decreased appetite.  Denies night sweats.  She reports she is independent of all ADL's but does require some help with her house work.  Continues to drive.  Has daily sputum production but no increase in quantity or color change.   PCCM consulted for evaluation of hypoxic respiratory failure.   Significant Hospital Events   9/17  Admit  9/18  Off BIPAP, tolerating well   Consults: date of consult/date signed off & final recs:  PCCM 9/17   Procedures (surgical and bedside):  9/17  Colonoscopy >> multiple polyps   Significant Diagnostic Tests:   Micro Data:  Antimicrobials:    Subjective:  Doing well this morning.  Patient states that she is hungry this morning.  She was transitioned off of BiPAP onto nasal cannula.  O2 sats stable overnight.  Of note there was increasing serum troponin level.  Continue to observe.  Denies chest pain.  Objective   Blood pressure (!) 114/33, pulse (!) 58, temperature 98.1 F (36.7 C),  temperature source Axillary, resp. rate 18, height 5\' 2"  (1.575 m), weight 43.9 kg, SpO2 98 %.    FiO2 (%):  [30 %-100 %] 30 %   Intake/Output Summary (Last 24 hours) at 11/10/2017 0905 Last data filed at 11/10/2017 0600 Gross per 24 hour  Intake 1080.9 ml  Output 0 ml  Net 1080.9 ml   Filed Weights   11/09/17 0955 11/09/17 1501  Weight: 41.7 kg 43.9 kg    Examination: General appearance: 77 y.o., female, NAD, conversant  Eyes: anicteric sclerae, moist conjunctivae; tracking appropriate HENT: NCAT; oropharynx, MMM, Neck: Trachea midline; supple jugular venous pulsation right anterior lower neck Lungs: Diminished bases bilaterally, no crackles, wheeze CV: Bradycardic, regular, S1-S2, distant heart tones, no overt Abdomen: Soft, non-tender; non-distended, BS present  Extremities: No peripheral edema or radial and DP pulses present bilaterally  Skin: Normal temperature, turgor and texture; no rash Psych: Appropriate affect, appropriate mood Neuro: Alert and oriented to person and place, no focal deficit    Resolved Hospital Problem list     Assessment & Plan:   Acute Hypoxemic Respiratory Failure  Postop hypoxemia, possible exacerbation of underlying COPD Elevated proBNP, probable mild pulmonary vascular congestion Improved off BiPAP Possible aspiration event P: Remains off BiPAP at this time, wean FiO2 to maintain sats above 88% PT OT evaluation, up out of chair out of bed as much as tolerated with assistance Decrease steroids to 40 mg prednisone to complete 5 days total Diuresis per primary team Continue scheduled bronchodilators De-escalate antimicrobials to Augmentin to  complete 5 days total  Hypertensive Urgency Hx Diastolic Dysfunction Elevated serum troponin Suspect demand ischemia secondary to hypertension and respiratory failure. P: Restart home BP meds PRN's for systolic blood pressure greater than 1 80-101 100 Trend troponins until peak No need for heparin  at this time  Hypokalemia P: Replete, goal K greater than 4, magnesium greater than 2  Leukocytosis  - will monitor  Anemia - no overt sign of bleeding  - should monitor H&H    Pulmonary will sign off at this time.  Please call with any questions.  Disposition / Summary of Today's Plan 11/10/17    Off BIPAP Pulmonary will sign     Diet: would advance  Pain/Anxiety/Delirium protocol (if indicated): n/a VAP protocol (if indicated) n/a  DVT prophylaxis: SCD's  GI prophylaxis: n/a Hyperglycemia protocol: n/a Mobility: as tolerated  Code Status: Full Code  Family Communication: spoke with patient this morning   Labs   CBC: Recent Labs  Lab 11/09/17 1538 11/10/17 0251  WBC 20.2* 21.7*  HGB 10.1* 7.5*  HCT 32.9* 23.9*  MCV 84.4 82.7  PLT 497* 374     Basic Metabolic Panel: Recent Labs  Lab 11/09/17 1538 11/10/17 0251  NA 138 140  K 3.3* 2.8*  CL 99 102  CO2 26 26  GLUCOSE 227* 113*  BUN 13 15  CREATININE 0.75 1.21*  CALCIUM 8.7* 8.0*   GFR: Estimated Creatinine Clearance: 26.6 mL/min (A) (by C-G formula based on SCr of 1.21 mg/dL (H)). Recent Labs  Lab 11/09/17 1538 11/09/17 1539 11/09/17 1838 11/10/17 0251  PROCALCITON <0.10  --   --  4.97  WBC 20.2*  --   --  21.7*  LATICACIDVEN  --  2.9* 1.3  --      Liver Function Tests: Recent Labs  Lab 11/09/17 1538 11/10/17 0251  AST 39 24  ALT 19 13  ALKPHOS 77 49  BILITOT 0.4 0.7  PROT 7.6 5.6*  ALBUMIN 2.4* 1.8*   No results for input(s): LIPASE, AMYLASE in the last 168 hours. No results for input(s): AMMONIA in the last 168 hours.   ABG    Component Value Date/Time   PHART 7.398 11/09/2017 1347   PCO2ART 44.0 11/09/2017 1347   PO2ART 52.8 (L) 11/09/2017 1347   HCO3 26.6 11/09/2017 1347   TCO2 23 11/15/2012 1705   ACIDBASEDEF 4.0 (H) 11/14/2012 1956   O2SAT 82.6 11/09/2017 1347    Coagulation Profile: No results for input(s): INR, PROTIME in the last 168 hours.   Cardiac  Enzymes: Recent Labs  Lab 11/09/17 1538 11/09/17 1913 11/10/17 0251  TROPONINI 0.03* 0.58* 1.03*     HbA1C: Hgb A1c MFr Bld  Date/Time Value Ref Range Status  07/15/2017 10:07 AM 7.0 (H) 4.6 - 6.5 % Final    Comment:    Glycemic Control Guidelines for People with Diabetes:Non Diabetic:  <6%Goal of Therapy: <7%Additional Action Suggested:  >8%   05/06/2016 02:12 PM 6.8 (H) 4.6 - 6.5 % Final    Comment:    Glycemic Control Guidelines for People with Diabetes:Non Diabetic:  <6%Goal of Therapy: <7%Additional Action Suggested:  >8%    CBG: Recent Labs  Lab 11/09/17 1753 11/09/17 2020 11/10/17 0011 11/10/17 0510 11/10/17 0759  GLUCAP 135* 85 107* 103* 98    Garner Nash, DO [facility] 11/10/2017 9:05 AM  Personal pager: [phone] If unanswered, please page CCM On-call: [phone]

## 2017-11-10 NOTE — Progress Notes (Signed)
PT Cancellation Note  Patient Details Name: Tonya Valdez MRN: [id number] DOB: 1940-03-08   Cancelled Treatment:    Reason Eval/Treat Not Completed: Other (comment) Checked at  900, patient had been recently taken off BiPAP and going  For test per RN. Will check back later.  Claretha Cooper 11/10/2017, 10:41 AM [place] Pager [phone] Office [phone]

## 2017-11-10 NOTE — Progress Notes (Signed)
Pharmacy Antibiotic Note  Tonya Valdez is a 77 y.o. female admitted on 11/09/2017 for elective colonoscopy with suspected aspiration PNA post procedure.  Pharmacy has been consulted for Unasyn dosing.  Today, 11/10/2017: Transitioned from Bipap to [place] O2 Afebrile WBC 21.7 (steroids) SCr increased 0.75 > 1.21  Plan:  Unasyn 3 gm IV q12h  Follow up renal fxn, culture results, and clinical course.  Follow up plans for PO Augmentin per PCCM notes.   Height: 5\' 2"  (157.5 cm) Weight: 96 lb 12.5 oz (43.9 kg) IBW/kg (Calculated) : 50.1  Temp (24hrs), Avg:98.4 F (36.9 C), Min:97.9 F (36.6 C), Max:99.5 F (37.5 C)  Recent Labs  Lab 11/09/17 1538 11/09/17 1539 11/09/17 1838 11/10/17 0251  WBC 20.2*  --   --  21.7*  CREATININE 0.75  --   --  1.21*  LATICACIDVEN  --  2.9* 1.3  --     Estimated Creatinine Clearance: 26.6 mL/min (A) (by C-G formula based on SCr of 1.21 mg/dL (H)).    No Known Allergies  Antimicrobials this admission: 9/17 Unasyn>>  Dose adjustments this admission:  Microbiology results:  Thank you for allowing pharmacy to be a part of this patient's care.  Gretta Arab PharmD, BCPS Pager [phone] 11/10/2017 10:39 AM

## 2017-11-10 NOTE — Progress Notes (Signed)
PROGRESS NOTE    Tonya Valdez   BLT:[id number]  DOB: 07/16/1940  DOA: 11/09/2017 PCP: Biagio Borg, MD   Brief Narrative:  Tonya Valdez with a history of coronary artery disease status post cardiac cath and CABG, diastolic heart failure, COPD, PAD, bladder cancer, lung cancer, moderate aortic valve stenosis, diabetes mellitus type 2, hyperlipidemia, depression.  Patient underwent a colonoscopy and subsequent to this was noted to be quite hypoxic in the recovery room and therefore was referred for admission.   Subjective: Taken off of BiPAP this morning.  She states that she feels like she may have aspirated however feels that she is breathing much better this morning.  No significant cough.  Would like to start eating and drinking. ROS: no complaints of nausea, vomiting, constipation diarrhea or dysuria. No other complaints.   Assessment & Plan:   Principal Problem:   Acute respiratory failure -Status post colonoscopy-possibly aspiration setting of an of acute COPD exacerbation - CXR today : Small bilateral pleural effusions greatest on the right. Possible atelectasis in the right upper lobe. Interstitial marking increased diffusely may reflect noncardiac pulmonary edema or interstitial pneumonia. - suspect above infiltrates are due to aspiration pneumonitis -Has been weaned off of BiPAP this morning-we will continue to monitor in stepdown unit for today - Agree with Unasyn and IV steroids which I will continue as well  -  Active Problems: Hypokalemia - will continue to replace aggressively orally and via IV  Elevated Troponin - Troponin continue to rise- will continue to follow - no chest pain    Peripheral vascular disease   - cont Aspirin    Essential hypertension - Coreg, Hydralazine, Imdur   Anemia  - acute- Hb drop from 10.1 to 7.5- ? Dilutional- will recheck and also check and anemia panel - colonoscopy did not reveal any acute bleeding  Mild rise in Cr - due  to acute issues and due to Lasix- will hold Lasix for now  DVT prophylaxis: SCDs Code Status: Full code Family Communication:  Disposition Plan: follow in SDU today Consultants:   PCCM Procedures:   Colonoscopy on 9/17 Antimicrobials:  Anti-infectives (From admission, onward)   Start     Dose/Rate Route Frequency Ordered Stop   11/10/17 1200  Ampicillin-Sulbactam (UNASYN) 3 g in sodium chloride 0.9 % 100 mL IVPB     3 g 200 mL/hr over 30 Minutes Intravenous Every 12 hours 11/10/17 0800     11/09/17 1600  Ampicillin-Sulbactam (UNASYN) 3 g in sodium chloride 0.9 % 100 mL IVPB  Status:  Discontinued     3 g 200 mL/hr over 30 Minutes Intravenous Every 8 hours 11/09/17 1457 11/10/17 0800       Objective: Vitals:   11/10/17 0600 11/10/17 0700 11/10/17 0800 11/10/17 0928  BP: (!) 114/33 (!) 133/36 (!) 132/50   Pulse: (!) 58 (!) 58 65   Resp: 18 19 19    Temp:   98.1 F (36.7 C)   TempSrc:   Axillary   SpO2: 98% 98% 98% 98%  Weight:      Height:        Intake/Output Summary (Last 24 hours) at 11/10/2017 1123 Last data filed at 11/10/2017 1112 Gross per 24 hour  Intake 887.03 ml  Output 0 ml  Net 887.03 ml   Filed Weights   11/09/17 0955 11/09/17 1501  Weight: 41.7 kg 43.9 kg    Examination: General exam: Appears comfortable  HEENT: PERRLA, oral mucosa moist, no sclera icterus  or thrush Respiratory system: mild rhonchi bilaterally- Respiratory effort normal. Cardiovascular system: S1 & S2 heard, RRR.   Gastrointestinal system: Abdomen soft, non-tender, nondistended. Normal bowel sound. No organomegaly Central nervous system: Alert and oriented. No focal neurological deficits. Extremities: No cyanosis, clubbing or edema Skin: No rashes or ulcers Psychiatry:  Mood & affect appropriate.     Data Reviewed: I have personally reviewed following labs and imaging studies  CBC: Recent Labs  Lab 11/09/17 1538 11/10/17 0251  WBC 20.2* 21.7*  HGB 10.1* 7.5*  HCT 32.9*  23.9*  MCV 84.4 82.7  PLT 497* 528   Basic Metabolic Panel: Recent Labs  Lab 11/09/17 1538 11/10/17 0251  NA 138 140  K 3.3* 2.8*  CL 99 102  CO2 26 26  GLUCOSE 227* 113*  BUN 13 15  CREATININE 0.75 1.21*  CALCIUM 8.7* 8.0*   GFR: Estimated Creatinine Clearance: 26.6 mL/min (A) (by C-G formula based on SCr of 1.21 mg/dL (H)). Liver Function Tests: Recent Labs  Lab 11/09/17 1538 11/10/17 0251  AST 39 24  ALT 19 13  ALKPHOS 77 49  BILITOT 0.4 0.7  PROT 7.6 5.6*  ALBUMIN 2.4* 1.8*   No results for input(s): LIPASE, AMYLASE in the last 168 hours. No results for input(s): AMMONIA in the last 168 hours. Coagulation Profile: No results for input(s): INR, PROTIME in the last 168 hours. Cardiac Enzymes: Recent Labs  Lab 11/09/17 1538 11/09/17 1913 11/10/17 0251  TROPONINI 0.03* 0.58* 1.03*   BNP (last 3 results) No results for input(s): PROBNP in the last 8760 hours. HbA1C: No results for input(s): HGBA1C in the last 72 hours. CBG: Recent Labs  Lab 11/09/17 1753 11/09/17 2020 11/10/17 0011 11/10/17 0510 11/10/17 0759  GLUCAP 135* 85 107* 103* 98   Lipid Profile: No results for input(s): CHOL, HDL, LDLCALC, TRIG, CHOLHDL, LDLDIRECT in the last 72 hours. Thyroid Function Tests: No results for input(s): TSH, T4TOTAL, FREET4, T3FREE, THYROIDAB in the last 72 hours. Anemia Panel: No results for input(s): VITAMINB12, FOLATE, FERRITIN, TIBC, IRON, RETICCTPCT in the last 72 hours. Urine analysis:    Component Value Date/Time   COLORURINE YELLOW 07/15/2017 1007   APPEARANCEUR CLEAR 07/15/2017 1007   LABSPEC 1.015 07/15/2017 1007   PHURINE 7.5 07/15/2017 1007   GLUCOSEU NEGATIVE 07/15/2017 1007   HGBUR NEGATIVE 07/15/2017 1007   BILIRUBINUR NEGATIVE 07/15/2017 1007   BILIRUBINUR negative 04/26/2012 [place] 07/15/2017 1007   PROTEINUR 30 (A) 11/06/2015 0847   UROBILINOGEN 0.2 07/15/2017 1007   NITRITE NEGATIVE 07/15/2017 1007   LEUKOCYTESUR  NEGATIVE 07/15/2017 1007   Sepsis Labs: @LABRCNTIP (procalcitonin:4,lacticidven:4) ) Recent Results (from the past 240 hour(s))  MRSA PCR Screening     Status: None   Collection Time: 11/09/17  3:03 PM  Result Value Ref Range Status   MRSA by PCR NEGATIVE NEGATIVE Final    Comment:        The GeneXpert MRSA Assay (FDA approved for NASAL specimens only), is one component of a comprehensive MRSA colonization surveillance program. It is not intended to diagnose MRSA infection nor to guide or monitor treatment for MRSA infections. Performed at [facility], [place] [address] [place], [place] [address]          Radiology Studies: Dg Chest Port 1 View  Result Date: 11/09/2017 CLINICAL DATA:  Hypoxia EXAM: PORTABLE CHEST 1 VIEW COMPARISON:  CT scan of the chest of June 18, 2016 FINDINGS: The lungs are well-expanded. The interstitial markings are  increased bilaterally. There is a small right and trace left pleural effusion. There is hazy increased density in the left upper lobe. The heart is normal in size. The pulmonary vascularity is not clearly engorged. The patient has undergone previous median sternotomy. There is calcification in the wall of the aortic arch. IMPRESSION: COPD. Small bilateral pleural effusions greatest on the right. Possible atelectasis in the right upper lobe. Interstitial marking increased diffusely may reflect noncardiac pulmonary edema or interstitial pneumonia. When the patient can tolerate the procedure, a PA and lateral chest x-ray would be useful. Thoracic aortic atherosclerosis. Electronically Signed   By: David  Martinique M.D.   On: 11/09/2017 13:13      Scheduled Meds: . aspirin  150 mg Rectal Daily  . carvedilol  18.75 mg Oral BID WC  . chlorhexidine  15 mL Mouth Rinse BID  . hydrALAZINE  25 mg Oral TID  . insulin aspart  0-9 Units Subcutaneous Q4H  . ipratropium-albuterol  3 mL Nebulization Q4H  . isosorbide mononitrate  30 mg Oral  Daily  . mouth rinse  15 mL Mouth Rinse q12n4p  . methylPREDNISolone (SOLU-MEDROL) injection  40 mg Intravenous Q12H  . multivitamin with minerals  1 tablet Oral Daily  . pantoprazole  40 mg Oral Daily  . potassium chloride  40 mEq Oral Q4H  . pravastatin  40 mg Oral QPM  . sodium chloride flush  3 mL Intravenous Q12H   Continuous Infusions: . sodium chloride 20 mL/hr at 11/10/17 1112  . ampicillin-sulbactam (UNASYN) IV    . lactated ringers Stopped (11/09/17 1430)  . potassium chloride 100 mL/hr at 11/10/17 1112     LOS: 1 day    Time spent in minutes: 35    Debbe Odea, MD [facility] Pager: [email or web address] Password [facility] 11/10/2017, 11:23 AM

## 2017-11-10 NOTE — Telephone Encounter (Signed)
Tonya Valdez has been informed.

## 2017-11-10 NOTE — Evaluation (Signed)
Physical Therapy Evaluation Patient Details Name: Tonya Valdez MRN: [id number] DOB: [birth year] Today's Date: 11/10/2017   History of Present Illness  Tonya Valdez with a history of coronary artery disease status post cardiac cath and CABG, diastolic heart failure, COPD, PAD, bladder cancer, lung cancer, moderate aortic valve stenosis, diabetes mellitus type 2, hyperlipidemia, depression.  Patient underwent a colonoscopy and subsequent to this was noted to be quite hypoxic in the recovery room . Weaned from BiPAP 11/10/17.  Clinical Impression  The patient mobilized for 20 minutes using bathroom on RA, lowest saturation 87%. sats returned to > 90% after resting.Encouraged pursed lip breaths. Patient should progress to Dc home with family Pt admitted with above diagnosis. Pt currently with functional limitations due to the deficits listed below (see PT Problem List).  Pt will benefit from skilled PT to increase their independence and safety with mobility to allow discharge to the venue listed below.       Follow Up Recommendations No PT follow up    Equipment Recommendations  None recommended by PT    Recommendations for Other Services OT consult     Precautions / Restrictions Precautions Precaution Comments: monitor O@ sats- ? walking test      Mobility  Bed Mobility Overal bed mobility: Independent                Transfers Overall transfer level: Needs assistance   Transfers: Sit to/from Stand Sit to Stand: Supervision            Ambulation/Gait Ambulation/Gait assistance: Supervision;Min guard Gait Distance (Feet): 30 Feet Assistive device: None Gait Pattern/deviations: Step-through pattern     General Gait Details: slow but steady  Science writer    Modified Rankin (Stroke Patients Only)       Balance                                             Pertinent Vitals/Pain Pain Assessment: No/denies  pain    Home Living Family/patient expects to be discharged to:: Private residence Living Arrangements: Children Available Help at Discharge: Family Type of Home: House Home Access: Level entry       Home Equipment: None Additional Comments: to stay with  daughter    Prior Function Level of Independence: Independent               Hand Dominance        Extremity/Trunk Assessment   Upper Extremity Assessment Upper Extremity Assessment: Generalized weakness    Lower Extremity Assessment Lower Extremity Assessment: Generalized weakness    Cervical / Trunk Assessment Cervical / Trunk Assessment: Kyphotic  Communication   Communication: No difficulties  Cognition Arousal/Alertness: Awake/alert Behavior During Therapy: WFL for tasks assessed/performed Overall Cognitive Status: Within Functional Limits for tasks assessed                                        General Comments      Exercises     Assessment/Plan    PT Assessment Patient needs continued PT services  PT Problem List         PT Treatment Interventions Gait training;Functional mobility training;Therapeutic activities;Patient/family education    PT Goals (Current goals  can be found in the Care Plan section)  Acute Rehab PT Goals Patient Stated Goal: go home PT Goal Formulation: With patient Time For Goal Achievement: 11/24/17 Potential to Achieve Goals: Good    Frequency Min 3X/week   Barriers to discharge        Co-evaluation               AM-PAC PT "6 Clicks" Daily Activity  Outcome Measure Difficulty turning over in bed (including adjusting bedclothes, sheets and blankets)?: None Difficulty moving from lying on back to sitting on the side of the bed? : None Difficulty sitting down on and standing up from a chair with arms (e.g., wheelchair, bedside commode, etc,.)?: A Little Help needed moving to and from a bed to chair (including a wheelchair)?: A Lot Help  needed walking in hospital room?: A Lot Help needed climbing 3-5 steps with a railing? : Total 6 Click Score: 16    End of Session   Activity Tolerance: Patient tolerated treatment well Patient left: in bed;with call bell/phone within reach;with bed alarm set Nurse Communication: Mobility status PT Visit Diagnosis: Unsteadiness on feet (R26.81)    Time: 6553-7482 PT Time Calculation (min) (ACUTE ONLY): 35 min   Charges:   PT Evaluation $PT Eval Low Complexity: 1 Low PT Treatments $Gait Training: 8-22 mins        Tresa Endo PT Acute Rehabilitation Services Pager [phone] Office [phone]   Claretha Cooper 11/10/2017, 4:56 PM

## 2017-11-10 NOTE — Progress Notes (Signed)
Patient taken off bipap at 0750 and placed on o2 at 2l .O2 saturation 98

## 2017-11-10 NOTE — Plan of Care (Signed)
  Problem: Nutrition: Goal: Adequate nutrition will be maintained Outcome: Progressing   Problem: Elimination: Goal: Will not experience complications related to bowel motility Outcome: Progressing   Problem: Pain Managment: Goal: General experience of comfort will improve Outcome: Progressing   

## 2017-11-10 NOTE — Progress Notes (Signed)
[facility] Progress Note Patient Name: Tonya Valdez DOB: 1940-08-13 MRN: [id number]   Date of Service  11/10/2017  HPI/Events of Note  K+ = 2.8 and Creatinine = 1.21.  eICU Interventions  Will replace K+.     Intervention Category Major Interventions: Electrolyte abnormality - evaluation and management  Sommer,Steven Eugene 11/10/2017, 5:55 AM

## 2017-11-11 DIAGNOSIS — I10 Essential (primary) hypertension: Secondary | ICD-10-CM

## 2017-11-11 DIAGNOSIS — E43 Unspecified severe protein-calorie malnutrition: Secondary | ICD-10-CM

## 2017-11-11 LAB — BASIC METABOLIC PANEL
Anion gap: 11 (ref 5–15)
BUN: 30 mg/dL — AB (ref 8–23)
CHLORIDE: 103 mmol/L (ref 98–111)
CO2: 22 mmol/L (ref 22–32)
CREATININE: 1.47 mg/dL — AB (ref 0.44–1.00)
Calcium: 7.7 mg/dL — ABNORMAL LOW (ref 8.9–10.3)
GFR calc Af Amer: 38 mL/min — ABNORMAL LOW (ref >60)
GFR calc non Af Amer: 33 mL/min — ABNORMAL LOW (ref >60)
GLUCOSE: 417 mg/dL — AB (ref 70–99)
POTASSIUM: 5.1 mmol/L (ref 3.5–5.1)
SODIUM: 136 mmol/L (ref 135–145)

## 2017-11-11 LAB — CBC
HCT: 24.1 % — ABNORMAL LOW (ref 36.0–46.0)
HEMATOCRIT: 25.8 % — AB (ref 36.0–46.0)
HEMOGLOBIN: 8.1 g/dL — AB (ref 12.0–15.0)
Hemoglobin: 7.6 g/dL — ABNORMAL LOW (ref 12.0–15.0)
MCH: 25.9 pg — AB (ref 26.0–34.0)
MCH: 25.7 pg — ABNORMAL LOW (ref 26.0–34.0)
MCHC: 31.5 g/dL (ref 30.0–36.0)
MCHC: 31.4 g/dL (ref 30.0–36.0)
MCV: 82.3 fL (ref 78.0–100.0)
MCV: 81.9 fL (ref 78.0–100.0)
PLATELETS: 384 10*3/uL (ref 150–400)
Platelets: 424 10*3/uL — ABNORMAL HIGH (ref 150–400)
RBC: 2.93 MIL/uL — ABNORMAL LOW (ref 3.87–5.11)
RBC: 3.15 MIL/uL — ABNORMAL LOW (ref 3.87–5.11)
RDW: 16 % — AB (ref 11.5–15.5)
RDW: 16.2 % — AB (ref 11.5–15.5)
WBC: 17.6 10*3/uL — ABNORMAL HIGH (ref 4.0–10.5)
WBC: 18 10*3/uL — AB (ref 4.0–10.5)

## 2017-11-11 LAB — PROCALCITONIN: Procalcitonin: 5.14 ng/mL

## 2017-11-11 LAB — FOLATE: Folate: 15.2 ng/mL (ref >5.9)

## 2017-11-11 LAB — GLUCOSE, CAPILLARY
GLUCOSE-CAPILLARY: 318 mg/dL — AB (ref 70–99)
GLUCOSE-CAPILLARY: 286 mg/dL — AB (ref 70–99)
GLUCOSE-CAPILLARY: 188 mg/dL — AB (ref 70–99)
Glucose-Capillary: 249 mg/dL — ABNORMAL HIGH (ref 70–99)

## 2017-11-11 LAB — IRON AND TIBC
IRON: 17 ug/dL — AB (ref 28–170)
Saturation Ratios: 9 % — ABNORMAL LOW (ref 10.4–31.8)
TIBC: 195 ug/dL — ABNORMAL LOW (ref 250–450)
UIBC: 178 ug/dL

## 2017-11-11 LAB — TROPONIN I: Troponin I: 0.26 ng/mL (ref <0.03)

## 2017-11-11 LAB — PREPARE RBC (CROSSMATCH)

## 2017-11-11 LAB — RETICULOCYTES
RBC.: 3.08 MIL/uL — AB (ref 3.87–5.11)
RETIC COUNT ABSOLUTE: 37 10*3/uL (ref 19.0–186.0)
Retic Ct Pct: 1.2 % (ref 0.4–3.1)

## 2017-11-11 LAB — FERRITIN: Ferritin: 411 ng/mL — ABNORMAL HIGH (ref 11–307)

## 2017-11-11 LAB — ABO/RH: ABO/RH(D): O NEG

## 2017-11-11 LAB — VITAMIN B12: VITAMIN B 12: 535 pg/mL (ref 180–914)

## 2017-11-11 MED ORDER — ENSURE ENLIVE PO LIQD: 237.0000 mL | ORAL | Status: DC

## 2017-11-11 MED ORDER — FUROSEMIDE 10 MG/ML IJ SOLN
40.0000 mg | Freq: Once | INTRAMUSCULAR | Status: AC
  Administered 2017-11-11: 40 mg via INTRAVENOUS
  Filled 2017-11-11: qty 4

## 2017-11-11 MED ORDER — IPRATROPIUM-ALBUTEROL 0.5-2.5 (3) MG/3ML IN SOLN
3.0000 mL | Freq: Three times a day (TID) | RESPIRATORY_TRACT | Status: DC
  Administered 2017-11-11 – 2017-11-12 (×3): 3 mL via RESPIRATORY_TRACT
  Filled 2017-11-11 (×3): qty 3

## 2017-11-11 MED ORDER — METHYLPREDNISOLONE SODIUM SUCC 40 MG IJ SOLR: 40.0000 mg | INTRAMUSCULAR | Status: DC

## 2017-11-11 MED ORDER — SODIUM CHLORIDE 0.9% IV SOLUTION: Freq: Once | INTRAVENOUS | Status: DC

## 2017-11-11 NOTE — Progress Notes (Signed)
Inpatient Diabetes Program Recommendations  AACE/ADA: New Consensus Statement on Inpatient Glycemic Control (2015)  Target Ranges:  Prepandial:   less than 140 mg/dL      Peak postprandial:   less than 180 mg/dL (1-2 hours)      Critically ill patients:  140 - 180 mg/dL   Results for Tonya Valdez, Tonya Valdez (MRN [id number]) as of 11/11/2017 10:09  Ref. Range 11/10/2017 07:59 11/10/2017 11:21 11/10/2017 15:55 11/10/2017 19:41 11/10/2017 23:16  Glucose-Capillary Latest Ref Range: 70 - 99 mg/dL 98   171 (H)  2 units NOVOLOG  220 (H)  3 units NOVOLOG  277 (H) 330 (H)   Results for Tonya Valdez, Tonya Valdez (MRN [id number]) as of 11/11/2017 10:09  Ref. Range 11/11/2017 07:16  Glucose-Capillary Latest Ref Range: 70 - 99 mg/dL 318 (H)  7 units NOVOLOG     Admit with: Postoperative acute hypoxic respiratory failure after Colonoscopy  History: DM, CHF  Home DM Meds: Metformin 500 mg BID  Current Orders: Novolog Sensitive Correction Scale/ SSI (0-9 units) TID AC     Patient getting Solumedrol 40 mg BID.  CBGs elevated likely due to steroids.     MD- If you plan to continue Solumedrol today, please consider the following in-hospital insulin adjustments:  1. Start Lantus 5 units Daily (0.1 units/kg dosing based on weight of 47 kg)  2. Start Novolog Meal Coverage: Novolog 3 units TID with meals   (Please add the following Hold Parameters: Hold if pt eats <50% of meal, Hold if pt NPO)     --Will follow patient during hospitalization--  Wyn Quaker RN, MSN, CDE Diabetes Coordinator Inpatient Glycemic Control Team Team Pager: [phone] (8a-5p)

## 2017-11-11 NOTE — Progress Notes (Signed)
PROGRESS NOTE    Tonya Valdez   AXK:[id number]  DOB: August 14, 1940  DOA: 11/09/2017 PCP: Biagio Borg, MD   Brief Narrative:  Tonya Valdez with a history of coronary artery disease status post cardiac cath and CABG, diastolic heart failure, COPD, PAD, bladder cancer, lung cancer, moderate aortic valve stenosis, diabetes mellitus type 2, hyperlipidemia, depression.  Patient underwent a colonoscopy and subsequent to this was noted to be quite hypoxic in the recovery room and therefore was referred for admission.   Subjective: Feels like she is breathing better at rest but gets short of breath and weak when ambulating.  ROS: no complaints of nausea, vomiting, constipation diarrhea or dysuria. No other complaints.   Assessment & Plan:   Principal Problem:   Acute respiratory failure - pro calcitonin as high as high 5.14  -Status post colonoscopy-possibly aspiration setting of an of acute COPD exacerbation - CXR today : Small bilateral pleural effusions greatest on the right. Possible atelectasis in the right upper lobe. Interstitial marking increased diffusely may reflect noncardiac pulmonary edema or interstitial pneumonia. - suspect above infiltrates are due to aspiration pneumonitis -Has been weaned off of BiPAP this morning-we will continue to monitor in stepdown unit for today - weight is up today and will need to give dose of IV Lasix  - continue Unasyn - begin to wean IV steroids - transfer out of SDU  -  Active Problems: Hypokalemia - replaced  Elevated Troponin with h/o CABG - Troponin trend has been flat - peak was 1.03- no chest pain    Peripheral vascular disease   - cont Aspirin    Essential hypertension - Coreg, Hydralazine, Imdur   Anemia  - acute- Hb drop from 10.1 to 7.5- ? Dilutional- 8.1 today but fluid up by about 5 kg - anemia panel showing AOCD- Ferretin is 411 Iron saturation is low- TIBC is low - colonoscopy did not reveal any acute bleeding      DVT prophylaxis: SCDs Code Status: Full code Family Communication:  Disposition Plan: transfer to Telemetry Consultants:   PCCM Procedures:   Colonoscopy on 9/17 Antimicrobials:  Anti-infectives (From admission, onward)   Start     Dose/Rate Route Frequency Ordered Stop   11/10/17 1200  Ampicillin-Sulbactam (UNASYN) 3 g in sodium chloride 0.9 % 100 mL IVPB     3 g 200 mL/hr over 30 Minutes Intravenous Every 12 hours 11/10/17 0800     11/09/17 1600  Ampicillin-Sulbactam (UNASYN) 3 g in sodium chloride 0.9 % 100 mL IVPB  Status:  Discontinued     3 g 200 mL/hr over 30 Minutes Intravenous Every 8 hours 11/09/17 1457 11/10/17 0800       Objective: Vitals:   11/11/17 0345 11/11/17 0400 11/11/17 0711 11/11/17 0723  BP:  (!) 151/66    Pulse:  72    Resp:  (!) 31    Temp: (!) 97.5 F (36.4 C)  97.9 F (36.6 C) 98.3 F (36.8 C)  TempSrc: Oral  Oral Oral  SpO2:  96%    Weight:      Height:        Intake/Output Summary (Last 24 hours) at 11/11/2017 0840 Last data filed at 11/11/2017 0400 Gross per 24 hour  Intake 974.75 ml  Output 200 ml  Net 774.75 ml   Filed Weights   11/09/17 0955 11/09/17 1501  Weight: 41.7 kg 43.9 kg    Examination: General exam: Appears comfortable  HEENT: PERRLA, oral mucosa moist, no  sclera icterus or thrush Respiratory system: Clear to auscultation- Respiratory effort normal. Cardiovascular system: S1 & S2 heard, RRR.   Gastrointestinal system: Abdomen soft, non-tender, nondistended. Normal bowel sound. No organomegaly Central nervous system: Alert and oriented. No focal neurological deficits. Extremities: No cyanosis, clubbing or edema Skin: No rashes or ulcers Psychiatry:  Mood & affect appropriate.     Data Reviewed: I have personally reviewed following labs and imaging studies  CBC: Recent Labs  Lab 11/09/17 1538 11/10/17 0251 11/11/17 0345  WBC 20.2* 21.7* 17.6*  HGB 10.1* 7.5* 7.6*  HCT 32.9* 23.9* 24.1*  MCV 84.4 82.7  82.3  PLT 497* 374 212   Basic Metabolic Panel: Recent Labs  Lab 11/09/17 1538 11/10/17 0251 11/10/17 1336 11/11/17 0345  NA 138 140 140 136  K 3.3* 2.8* 4.1 5.1  CL 99 102 103 103  CO2 26 26 26 22   GLUCOSE 227* 113* 235* 417*  BUN 13 15 21  30*  CREATININE 0.75 1.21* 1.29* 1.47*  CALCIUM 8.7* 8.0* 8.0* 7.7*  MG  --  1.1*  --   --    GFR: Estimated Creatinine Clearance: 21.9 mL/min (A) (by C-G formula based on SCr of 1.47 mg/dL (H)). Liver Function Tests: Recent Labs  Lab 11/09/17 1538 11/10/17 0251  AST 39 24  ALT 19 13  ALKPHOS 77 49  BILITOT 0.4 0.7  PROT 7.6 5.6*  ALBUMIN 2.4* 1.8*   No results for input(s): LIPASE, AMYLASE in the last 168 hours. No results for input(s): AMMONIA in the last 168 hours. Coagulation Profile: No results for input(s): INR, PROTIME in the last 168 hours. Cardiac Enzymes: Recent Labs  Lab 11/09/17 1913 11/10/17 0251 11/10/17 1200 11/10/17 1715 11/10/17 2323  TROPONINI 0.58* 1.03* 0.49* 0.31* 0.26*   BNP (last 3 results) No results for input(s): PROBNP in the last 8760 hours. HbA1C: No results for input(s): HGBA1C in the last 72 hours. CBG: Recent Labs  Lab 11/10/17 1121 11/10/17 1555 11/10/17 1941 11/10/17 2316 11/11/17 0716  GLUCAP 171* 220* 277* 330* 318*   Lipid Profile: No results for input(s): CHOL, HDL, LDLCALC, TRIG, CHOLHDL, LDLDIRECT in the last 72 hours. Thyroid Function Tests: No results for input(s): TSH, T4TOTAL, FREET4, T3FREE, THYROIDAB in the last 72 hours. Anemia Panel: No results for input(s): VITAMINB12, FOLATE, FERRITIN, TIBC, IRON, RETICCTPCT in the last 72 hours. Urine analysis:    Component Value Date/Time   COLORURINE YELLOW 07/15/2017 1007   APPEARANCEUR CLEAR 07/15/2017 1007   LABSPEC 1.015 07/15/2017 1007   PHURINE 7.5 07/15/2017 1007   GLUCOSEU NEGATIVE 07/15/2017 1007   HGBUR NEGATIVE 07/15/2017 1007   BILIRUBINUR NEGATIVE 07/15/2017 1007   BILIRUBINUR negative 04/26/2012 [place] 07/15/2017 1007   PROTEINUR 30 (A) 11/06/2015 0847   UROBILINOGEN 0.2 07/15/2017 1007   NITRITE NEGATIVE 07/15/2017 1007   LEUKOCYTESUR NEGATIVE 07/15/2017 1007   Sepsis Labs: @LABRCNTIP (procalcitonin:4,lacticidven:4) ) Recent Results (from the past 240 hour(s))  MRSA PCR Screening     Status: None   Collection Time: 11/09/17  3:03 PM  Result Value Ref Range Status   MRSA by PCR NEGATIVE NEGATIVE Final    Comment:        The GeneXpert MRSA Assay (FDA approved for NASAL specimens only), is one component of a comprehensive MRSA colonization surveillance program. It is not intended to diagnose MRSA infection nor to guide or monitor treatment for MRSA infections. Performed at [facility], [place] [address] [place], [place] [address]  Radiology Studies: Dg Chest 2 View  Result Date: 11/10/2017 CLINICAL DATA:  Hypoxia. History of bladder cancer and congestive heart failure. EXAM: CHEST - 2 VIEW COMPARISON:  Portable chest 11/09/2017.  Chest CT 06/18/2016. FINDINGS: The heart size and mediastinal contours appear stable status post median sternotomy and CABG. There is aortic atherosclerosis. There are small right-greater-than-left pleural effusions with diffusely increased interstitial prominence, likely edema superimposed on emphysema. No consolidation or dominant pulmonary nodule identified. There is a lower thoracic compression deformity which appears new compared with the prior CT. IMPRESSION: Diffusely increased interstitial prominence and bilateral pleural effusions, suspicious for congestive heart failure superimposed on emphysema. Aortic atherosclerosis post CABG. Electronically Signed   By: Richardean Sale M.D.   On: 11/10/2017 14:24   Dg Chest Port 1 View  Result Date: 11/09/2017 CLINICAL DATA:  Hypoxia EXAM: PORTABLE CHEST 1 VIEW COMPARISON:  CT scan of the chest of June 18, 2016 FINDINGS: The lungs are well-expanded. The  interstitial markings are increased bilaterally. There is a small right and trace left pleural effusion. There is hazy increased density in the left upper lobe. The heart is normal in size. The pulmonary vascularity is not clearly engorged. The patient has undergone previous median sternotomy. There is calcification in the wall of the aortic arch. IMPRESSION: COPD. Small bilateral pleural effusions greatest on the right. Possible atelectasis in the right upper lobe. Interstitial marking increased diffusely may reflect noncardiac pulmonary edema or interstitial pneumonia. When the patient can tolerate the procedure, a PA and lateral chest x-ray would be useful. Thoracic aortic atherosclerosis. Electronically Signed   By: David  Martinique M.D.   On: 11/09/2017 13:13      Scheduled Meds: . aspirin EC  81 mg Oral Daily  . carvedilol  18.75 mg Oral BID WC  . chlorhexidine  15 mL Mouth Rinse BID  . hydrALAZINE  25 mg Oral TID  . insulin aspart  0-9 Units Subcutaneous TID WC  . ipratropium-albuterol  3 mL Nebulization Q4H  . isosorbide mononitrate  30 mg Oral Daily  . mouth rinse  15 mL Mouth Rinse q12n4p  . methylPREDNISolone (SOLU-MEDROL) injection  40 mg Intravenous Q12H  . multivitamin with minerals  1 tablet Oral Daily  . pantoprazole  40 mg Oral Daily  . pravastatin  40 mg Oral QPM  . sodium chloride flush  3 mL Intravenous Q12H   Continuous Infusions: . sodium chloride Stopped (11/11/17 0005)  . ampicillin-sulbactam (UNASYN) IV Stopped (11/11/17 0035)  . lactated ringers Stopped (11/09/17 1430)     LOS: 2 days    Time spent in minutes: 35    Debbe Odea, MD [facility] Pager: [email or web address] Password [email or web address] 11/11/2017, 8:40 AM

## 2017-11-11 NOTE — Progress Notes (Signed)
Initial Nutrition Assessment  DOCUMENTATION CODES:   Severe malnutrition in context of chronic illness, Underweight  INTERVENTION:  - Will order Ensure Enlive once/day, this supplement provides 350 kcal and 20 grams of protein. - Continue to encourage PO intakes.    NUTRITION DIAGNOSIS:   Severe Malnutrition related to chronic illness(COPD) as evidenced by moderate fat depletion, moderate muscle depletion, severe fat depletion, severe muscle depletion.  GOAL:   Patient will meet greater than or equal to 90% of their needs  MONITOR:   PO intake, Supplement acceptance, Weight trends, Labs  REASON FOR ASSESSMENT:   Other (Comment)(underweight BMI)  ASSESSMENT:   77 year-old female with hx of CAD s/p cardiac cath and CABG, diastolic heart failure, COPD, PAD, bladder cancer, lung cancer, moderate aortic valve stenosis, DM type 2, hyperlipidemia, and depression. Patient underwent a colonoscopy and subsequent became hypoxic in PACU and therefore admitted to the hospital on BiPAP. Patient was taken off of BiPAP 9/18 AM.   Per chart review, patient consumed 50% of breakfast 9/18 (165 kcal, 9 grams of protein) and 90% of breakfast this AM (395 kcal, 14 grams of protein). Patient denies SOB/worsening SOB with PO intakes, denies difficulty chewing or swallowing, denies abdominal pain or nausea. She states that appetite is at baseline. At baseline, she is unable to eat much at one time and typically eats smaller meals more frequently throughout the day. She likes the food here but states that portions are too big/bigger than she can tolerate.   NFPE outlined below. Per chart review, weight was stable 06/2016-09/2017. During this admission weight has been trending up; question if this is related to IVF versus bed scale. Will continue to monitor closely. Noted CBGs; will order Ensure as benefit outweighs high carb content for this patient.     Medications reviewed; 40 mg IV Lasix x1 doe today,  sliding scale Novolog, 40 mg Solu-medrol BID, daily multivitamin with minerals, 10 mEq IV KCl x6 runs yesterday, 40 mEq K-Dur x2 doses yesterday.  Labs reviewed; CBG: 318 mg/dL today, BUN: 30 mg/dL, creatinine: 1.47 mg/dL, Ca: 7.7 mg/dL. IVF; LR @ 20 mL/hr.       NUTRITION - FOCUSED PHYSICAL EXAM:    Most Recent Value  Orbital Region  Mild depletion  Upper Arm Region  Moderate depletion  Thoracic and Lumbar Region  Unable to assess  Buccal Region  Severe depletion  Temple Region  Moderate depletion  Clavicle Bone Region  Severe depletion  Clavicle and Acromion Bone Region  Severe depletion  Scapular Bone Region  Moderate depletion  Dorsal Hand  Moderate depletion  Patellar Region  Moderate depletion  Anterior Thigh Region  Severe depletion  Posterior Calf Region  Severe depletion  Edema (RD Assessment)  None  Hair  Reviewed  Eyes  Reviewed  Mouth  Reviewed  Skin  Reviewed  Nails  Reviewed       Diet Order:   Diet Order            Diet Carb Modified Fluid consistency: Thin; Room service appropriate? Yes  Diet effective now              EDUCATION NEEDS:   No education needs have been identified at this time  Skin:  Skin Assessment: Reviewed RN Assessment  Last BM:  9/19  Height:   Ht Readings from Last 1 Encounters:  11/09/17 5\' 2"  (1.575 m)    Weight:   Wt Readings from Last 1 Encounters:  11/11/17 47.4 kg  Ideal Body Weight:  50 kg  BMI:  Body mass index is 19.11 kg/m.  Estimated Nutritional Needs:   Kcal:  1425-1615 (30-34 kcal/kg)  Protein:  60-70 grams  Fluid:  >/= 1.5 L/day     Jarome Matin, MS, RD, LDN, [facility] Inpatient Clinical Dietitian Pager # [phone] After hours/weekend pager # [phone]

## 2017-11-11 NOTE — Progress Notes (Signed)
Pt transferred to room 1424. Telemetry initiated, vital signs stable. Agree with previous RN assessment with the exception of the skin assessment. Stage 2 wound found on patient's sacrum, foam applied. Arti Trang, Bing Neighbors, RN

## 2017-11-11 NOTE — Progress Notes (Signed)
Physical Therapy Discharge Patient Details Name: Tonya Valdez MRN: [id number] DOB: 12-30-1940 Today's Date: 11/11/2017 Time:  -     Patient discharged from PT services secondary to goals met and no further PT needs identified and pt reports being back to baseline and declines need for PT at this time. Pt states she ambulated already today and SpO2 95% on room air.  Please see latest therapy progress note for current level of functioning and progress toward goals.    Progress and discharge plan discussed with patient and/or caregiver: Patient/Caregiver agrees with plan  GP   Patient politely declines need for PT.  Agreeable to ambulate with nursing staff for remainder of stay and hopeful for d/c home tomorrow.  Tonya Valdez,Tonya Valdez 11/11/2017, 3:20 PM  Tonya Valdez, PT, DPT Acute Rehabilitation Services Office: [phone] Pager: [phone]

## 2017-11-11 NOTE — Progress Notes (Signed)
Walked in hall with help.On roomair o2 saturation was 95.

## 2017-11-12 DIAGNOSIS — D123 Benign neoplasm of transverse colon: Secondary | ICD-10-CM

## 2017-11-12 DIAGNOSIS — R0602 Shortness of breath: Secondary | ICD-10-CM

## 2017-11-12 DIAGNOSIS — R9431 Abnormal electrocardiogram [ECG] [EKG]: Secondary | ICD-10-CM

## 2017-11-12 DIAGNOSIS — J438 Other emphysema: Secondary | ICD-10-CM

## 2017-11-12 DIAGNOSIS — K621 Rectal polyp: Secondary | ICD-10-CM

## 2017-11-12 DIAGNOSIS — E876 Hypokalemia: Secondary | ICD-10-CM

## 2017-11-12 DIAGNOSIS — D120 Benign neoplasm of cecum: Secondary | ICD-10-CM

## 2017-11-12 DIAGNOSIS — R778 Other specified abnormalities of plasma proteins: Secondary | ICD-10-CM

## 2017-11-12 DIAGNOSIS — R7989 Other specified abnormal findings of blood chemistry: Secondary | ICD-10-CM

## 2017-11-12 DIAGNOSIS — D125 Benign neoplasm of sigmoid colon: Secondary | ICD-10-CM

## 2017-11-12 DIAGNOSIS — L8990 Pressure ulcer of unspecified site, unspecified stage: Secondary | ICD-10-CM

## 2017-11-12 LAB — CBC
HCT: 25 % — ABNORMAL LOW (ref 36.0–46.0)
Hemoglobin: 7.9 g/dL — ABNORMAL LOW (ref 12.0–15.0)
MCH: 25.9 pg — AB (ref 26.0–34.0)
MCHC: 31.6 g/dL (ref 30.0–36.0)
MCV: 82 fL (ref 78.0–100.0)
PLATELETS: 427 10*3/uL — AB (ref 150–400)
RBC: 3.05 MIL/uL — ABNORMAL LOW (ref 3.87–5.11)
RDW: 16 % — ABNORMAL HIGH (ref 11.5–15.5)
WBC: 20.8 10*3/uL — ABNORMAL HIGH (ref 4.0–10.5)

## 2017-11-12 LAB — BASIC METABOLIC PANEL
Anion gap: 12 (ref 5–15)
BUN: 32 mg/dL — AB (ref 8–23)
CALCIUM: 8.2 mg/dL — AB (ref 8.9–10.3)
CO2: 23 mmol/L (ref 22–32)
CREATININE: 1.32 mg/dL — AB (ref 0.44–1.00)
Chloride: 105 mmol/L (ref 98–111)
GFR calc Af Amer: 44 mL/min — ABNORMAL LOW (ref >60)
GFR, EST NON AFRICAN AMERICAN: 38 mL/min — AB (ref >60)
GLUCOSE: 242 mg/dL — AB (ref 70–99)
Potassium: 4.1 mmol/L (ref 3.5–5.1)
Sodium: 140 mmol/L (ref 135–145)

## 2017-11-12 LAB — GLUCOSE, CAPILLARY
GLUCOSE-CAPILLARY: 193 mg/dL — AB (ref 70–99)
Glucose-Capillary: 196 mg/dL — ABNORMAL HIGH (ref 70–99)

## 2017-11-12 MED ORDER — LORATADINE 10 MG PO TABS: 10.0000 mg | ORAL_TABLET | Freq: Every day | ORAL | 0 refills | Status: AC

## 2017-11-12 MED ORDER — FLUTICASONE PROPIONATE 50 MCG/ACT NA SUSP: 1.0000 | Freq: Every day | NASAL | 2 refills | Status: AC

## 2017-11-12 MED ORDER — AMOXICILLIN-POT CLAVULANATE 875-125 MG PO TABS: 1.0000 | ORAL_TABLET | Freq: Two times a day (BID) | ORAL | 0 refills | Status: AC

## 2017-11-12 MED ORDER — FUROSEMIDE 10 MG/ML IJ SOLN
40.0000 mg | Freq: Once | INTRAMUSCULAR | Status: AC
  Administered 2017-11-12: 40 mg via INTRAVENOUS
  Filled 2017-11-12: qty 4

## 2017-11-12 NOTE — Discharge Summary (Signed)
Physician Discharge Summary  Tonya Valdez VQX:[id number] DOB: April 10, 1940 DOA: 11/09/2017  PCP: Biagio Borg, MD  Admit date: 11/09/2017 Discharge date: 11/12/2017  Admitted From: home Disposition:  home   Recommendations for Outpatient Follow-up:  1. F/u on chest pain and elevated Troponin    Discharge Condition:  stable   CODE STATUS:  Full code   Consultants:   PCCM Procedures:   Colonoscopy on 9/17   Discharge Diagnoses:  Principal Problem:   Acute respiratory failure ([place]) Active Problems:   Elevated troponin   COPD (chronic obstructive pulmonary disease) (HCC)   Peripheral vascular disease (HCC)   Essential hypertension   Protein-calorie malnutrition, severe   Pressure injury of skin   Prolonged QT interval   Hypokalemia    Brief Summary: Tonya Valdez with a history of coronary artery disease status post cardiac cath and CABG, diastolic heart failure, COPD, PAD, bladder cancer, lung cancer, moderate aortic valve stenosis, diabetes mellitus type 2, hyperlipidemia, depression.   The patient underwent a colonoscopy and subsequent to this was noted to be quite hypoxic in the recovery room. She was, therefore, referred for admission. She required a BiPAP and was admitted to the SDU.  Hospital Course:  Principal Problem:   Acute respiratory failure - aspiration pneumonia- acute on chronic diastolic (grade2) CHF - pro calcitonin 5.14  -Status post colonoscopy- she most likely has an aspiration pneumonitis - CXR  : Small bilateral pleural effusions greatest on the right. Possible atelectasis in the right upper lobe. Interstitial marking increased diffusely may reflect noncardiac pulmonary edema or interstitial pneumonia. -  weaned off of BiPAP by day 2 -  -  Unasyn transitioned to Augmentin today- d/c IV steroids as he has had no wheezing -  - Diuresed with IV Lasix as well - now ambulating well down the hall without hypoxia or dyspnea- stable to continue antibiotic  course as outpt   Active Problems: Hypokalemia - K as low as 2.8- replaced  Elevated Troponin with h/o CABG - Troponin trend has been flat - peak was 1.03- no chest pain- she has a f/u appt with Dr Stanford Breed coming up soon- I have notified him of the Troponin today- he does not recommend further inpatient work up-  he can do further work up as outpt once her respiratory status reaches baseline  Prolonged QT - QT 505- avoid further QT prolonging meds  Anemia  - acute- Hb drop from 10.1 to 7.5- likely Dilutional - anemia panel showing AOCD- Ferretin is 411 Iron saturation is low- TIBC is low - colonoscopy did not reveal any acute bleeding    Peripheral vascular disease   - cont Aspirin    Essential hypertension - Coreg, Hydralazine, Imdur    Discharge Exam: Vitals:   11/12/17 0413 11/12/17 0824  BP: (!) 154/95   Pulse: 72   Resp: 18   Temp: (!) 97.5 F (36.4 C)   SpO2: 93% 94%   Vitals:   11/11/17 1953 11/11/17 2051 11/12/17 0413 11/12/17 0824  BP:  122/67 (!) 154/95   Pulse:  64 72   Resp:  18 18   Temp:  97.9 F (36.6 C) (!) 97.5 F (36.4 C)   TempSrc:  Oral Oral   SpO2: 96% 95% 93% 94%  Weight:   44.8 kg   Height:        General: Pt is alert, awake, not in acute distress Cardiovascular: RRR, S1/S2 +, no rubs, no gallops Respiratory: CTA bilaterally, no wheezing, no  rhonchi Abdominal: Soft, NT, ND, bowel sounds + Extremities: no edema, no cyanosis   Discharge Instructions  Discharge Instructions    Diet - low sodium heart healthy   Complete by:  As directed    Increase activity slowly   Complete by:  As directed      Allergies as of 11/12/2017   No Known Allergies     Medication List    STOP taking these medications   bismuth-metronidazole-tetracycline 140-125-125 MG capsule Commonly known as:  PYLERA     TAKE these medications   amoxicillin-clavulanate 875-125 MG tablet Commonly known as:  AUGMENTIN Take 1 tablet by mouth every 12  (twelve) hours for 10 days.   ARTIFICIAL TEARS OP Place 1 drop into both eyes 4 (four) times daily as needed (for dry eyes).   aspirin EC 81 MG tablet Take 81 mg by mouth daily.   carvedilol 12.5 MG tablet Commonly known as:  COREG Take 1 & 1/2 tablets by mouth twice a day with a meal What changed:    how much to take  how to take this  when to take this  additional instructions   ferrous sulfate 160 (50 Fe) MG Tbcr SR tablet Commonly known as:  SLOW FE Take 1 tablet (160 mg total) by mouth daily.   Fish Oil 1200 MG Caps Take 1,200 mg by mouth daily.   fluticasone 50 MCG/ACT nasal spray Commonly known as:  FLONASE Place 1 spray into both nostrils daily.   furosemide 20 MG tablet Commonly known as:  LASIX Take 1 tablet (20 mg total) by mouth daily. NEED OV.   glucose blood test strip 1 each by Other route 2 (two) times daily. Use to check blood sugar two times a day. Dx E11.9   hydrALAZINE 25 MG tablet Commonly known as:  APRESOLINE Take 1 tablet (25 mg total) by mouth 3 (three) times daily. NEED OV.   isosorbide mononitrate 30 MG 24 hr tablet Commonly known as:  IMDUR Take 1 tablet (30 mg total) by mouth daily.   loratadine 10 MG tablet Commonly known as:  CLARITIN Take 1 tablet (10 mg total) by mouth daily.   metFORMIN 500 MG tablet Commonly known as:  GLUCOPHAGE Take 1 tablet by mouth twice a day with a meal What changed:    how much to take  how to take this  when to take this  additional instructions   multivitamin tablet Take 1 tablet by mouth daily.   omeprazole 40 MG capsule Commonly known as:  PRILOSEC Take 1 capsule (40 mg total) by mouth 2 (two) times daily.   ONE TOUCH ULTRA 2 w/Device Kit Use to check blood sugars twice a day Dx E11.9   onetouch ultrasoft lancets 1 each by Other route 4 (four) times daily. Use to help check blood sugars four times a day Dx E11.9   onetouch ultrasoft lancets Use to help check blood sugars twice  a day   pravastatin 40 MG tablet Commonly known as:  PRAVACHOL Take 1 tablet (40 mg total) by mouth every evening. NEED OV.   Vitamin D (Ergocalciferol) 50000 units Caps capsule Commonly known as:  DRISDOL Take 1 capsule (50,000 Units total) by mouth every 7 (seven) days.       No Known Allergies   Procedures/Studies:    Dg Chest 2 View  Result Date: 11/10/2017 CLINICAL DATA:  Hypoxia. History of bladder cancer and congestive heart failure. EXAM: CHEST - 2 VIEW COMPARISON:  Portable chest  11/09/2017.  Chest CT 06/18/2016. FINDINGS: The heart size and mediastinal contours appear stable status post median sternotomy and CABG. There is aortic atherosclerosis. There are small right-greater-than-left pleural effusions with diffusely increased interstitial prominence, likely edema superimposed on emphysema. No consolidation or dominant pulmonary nodule identified. There is a lower thoracic compression deformity which appears new compared with the prior CT. IMPRESSION: Diffusely increased interstitial prominence and bilateral pleural effusions, suspicious for congestive heart failure superimposed on emphysema. Aortic atherosclerosis post CABG. Electronically Signed   By: Richardean Sale M.D.   On: 11/10/2017 14:24   Dg Chest Port 1 View  Result Date: 11/09/2017 CLINICAL DATA:  Hypoxia EXAM: PORTABLE CHEST 1 VIEW COMPARISON:  CT scan of the chest of June 18, 2016 FINDINGS: The lungs are well-expanded. The interstitial markings are increased bilaterally. There is a small right and trace left pleural effusion. There is hazy increased density in the left upper lobe. The heart is normal in size. The pulmonary vascularity is not clearly engorged. The patient has undergone previous median sternotomy. There is calcification in the wall of the aortic arch. IMPRESSION: COPD. Small bilateral pleural effusions greatest on the right. Possible atelectasis in the right upper lobe. Interstitial marking increased  diffusely may reflect noncardiac pulmonary edema or interstitial pneumonia. When the patient can tolerate the procedure, a PA and lateral chest x-ray would be useful. Thoracic aortic atherosclerosis. Electronically Signed   By: David  Martinique M.D.   On: 11/09/2017 13:13     The results of significant diagnostics from this hospitalization (including imaging, microbiology, ancillary and laboratory) are listed below for reference.     Microbiology: Recent Results (from the past 240 hour(s))  MRSA PCR Screening     Status: None   Collection Time: 11/09/17  3:03 PM  Result Value Ref Range Status   MRSA by PCR NEGATIVE NEGATIVE Final    Comment:        The GeneXpert MRSA Assay (FDA approved for NASAL specimens only), is one component of a comprehensive MRSA colonization surveillance program. It is not intended to diagnose MRSA infection nor to guide or monitor treatment for MRSA infections. Performed at [facility], [place] [address] [place], [place] [address]      Labs: BNP (last 3 results) Recent Labs    11/09/17 1538  BNP 0,626.9*   Basic Metabolic Panel: Recent Labs  Lab 11/09/17 1538 11/10/17 0251 11/10/17 1336 11/11/17 0345 11/12/17 0420  NA 138 140 140 136 140  K 3.3* 2.8* 4.1 5.1 4.1  CL 99 102 103 103 105  CO2 26 26 26 22 23   GLUCOSE 227* 113* 235* 417* 242*  BUN 13 15 21  30* 32*  CREATININE 0.75 1.21* 1.29* 1.47* 1.32*  CALCIUM 8.7* 8.0* 8.0* 7.7* 8.2*  MG  --  1.1*  --   --   --    Liver Function Tests: Recent Labs  Lab 11/09/17 1538 11/10/17 0251  AST 39 24  ALT 19 13  ALKPHOS 77 49  BILITOT 0.4 0.7  PROT 7.6 5.6*  ALBUMIN 2.4* 1.8*   No results for input(s): LIPASE, AMYLASE in the last 168 hours. No results for input(s): AMMONIA in the last 168 hours. CBC: Recent Labs  Lab 11/09/17 1538 11/10/17 0251 11/11/17 0345 11/11/17 1031 11/12/17 0420  WBC 20.2* 21.7* 17.6* 18.0* 20.8*  HGB 10.1* 7.5* 7.6* 8.1* 7.9*  HCT 32.9*  23.9* 24.1* 25.8* 25.0*  MCV 84.4 82.7 82.3 81.9 82.0  PLT 497* 374 384 424*  427*   Cardiac Enzymes: Recent Labs  Lab 11/09/17 1913 11/10/17 0251 11/10/17 1200 11/10/17 1715 11/10/17 2323  TROPONINI 0.58* 1.03* 0.49* 0.31* 0.26*   BNP: Invalid input(s): POCBNP CBG: Recent Labs  Lab 11/11/17 1216 11/11/17 1619 11/11/17 2048 11/12/17 0737 11/12/17 1157  GLUCAP 286* 188* 249* 193* 196*   D-Dimer No results for input(s): DDIMER in the last 72 hours. Hgb A1c No results for input(s): HGBA1C in the last 72 hours. Lipid Profile No results for input(s): CHOL, HDL, LDLCALC, TRIG, CHOLHDL, LDLDIRECT in the last 72 hours. Thyroid function studies No results for input(s): TSH, T4TOTAL, T3FREE, THYROIDAB in the last 72 hours.  Invalid input(s): FREET3 Anemia work up Recent Labs    11/11/17 0826  VITAMINB12 535  FOLATE 15.2  FERRITIN 411*  TIBC 195*  IRON 17*  RETICCTPCT 1.2   Urinalysis    Component Value Date/Time   COLORURINE YELLOW 07/15/2017 [place] 07/15/2017 1007   LABSPEC 1.015 07/15/2017 1007   PHURINE 7.5 07/15/2017 1007   GLUCOSEU NEGATIVE 07/15/2017 1007   HGBUR NEGATIVE 07/15/2017 1007   BILIRUBINUR NEGATIVE 07/15/2017 1007   BILIRUBINUR negative 04/26/2012 1658   KETONESUR NEGATIVE 07/15/2017 1007   PROTEINUR 30 (A) 11/06/2015 0847   UROBILINOGEN 0.2 07/15/2017 1007   NITRITE NEGATIVE 07/15/2017 1007   LEUKOCYTESUR NEGATIVE 07/15/2017 1007   Sepsis Labs Invalid input(s): PROCALCITONIN,  WBC,  LACTICIDVEN Microbiology Recent Results (from the past 240 hour(s))  MRSA PCR Screening     Status: None   Collection Time: 11/09/17  3:03 PM  Result Value Ref Range Status   MRSA by PCR NEGATIVE NEGATIVE Final    Comment:        The GeneXpert MRSA Assay (FDA approved for NASAL specimens only), is one component of a comprehensive MRSA colonization surveillance program. It is not intended to diagnose MRSA infection nor to guide  or monitor treatment for MRSA infections. Performed at [facility], [place] [address], [place], [place] [address]      Time coordinating discharge in minutes: 27  SIGNED:   Debbe Odea, MD  [facility] 11/12/2017, 12:26 PM Pager   If 7PM-7AM, please contact night-coverage [email or web address] Password [email or web address]

## 2017-11-12 NOTE — Care Management Note (Signed)
Case Management Note  Patient Details  Name: Tonya Valdez MRN: [id number] Date of Birth: 1940-02-25  Subjective/Objective:  No CM needs.                  Action/Plan:dc home.   Expected Discharge Date:  11/12/17               Expected Discharge Plan:  Home/Self Care  In-House Referral:     Discharge planning Services  CM Consult  Post Acute Care Choice:    Choice offered to:     DME Arranged:    DME Agency:     HH Arranged:    HH Agency:     Status of Service:  Completed, signed off  If discussed at [employer] of Stay Meetings, dates discussed:    Additional Comments:  Dessa Phi, RN 11/12/2017, 11:12 AM

## 2017-11-13 DIAGNOSIS — I10 Essential (primary) hypertension: Secondary | ICD-10-CM | POA: Diagnosis not present

## 2017-11-13 DIAGNOSIS — Z85118 Personal history of other malignant neoplasm of bronchus and lung: Secondary | ICD-10-CM | POA: Diagnosis not present

## 2017-11-13 DIAGNOSIS — R062 Wheezing: Secondary | ICD-10-CM | POA: Diagnosis not present

## 2017-11-13 DIAGNOSIS — D649 Anemia, unspecified: Secondary | ICD-10-CM | POA: Diagnosis not present

## 2017-11-13 DIAGNOSIS — J90 Pleural effusion, not elsewhere classified: Secondary | ICD-10-CM | POA: Diagnosis not present

## 2017-11-13 DIAGNOSIS — R0602 Shortness of breath: Secondary | ICD-10-CM | POA: Diagnosis not present

## 2017-11-13 DIAGNOSIS — I5043 Acute on chronic combined systolic (congestive) and diastolic (congestive) heart failure: Secondary | ICD-10-CM | POA: Diagnosis not present

## 2017-11-13 DIAGNOSIS — I110 Hypertensive heart disease with heart failure: Secondary | ICD-10-CM | POA: Diagnosis not present

## 2017-11-13 DIAGNOSIS — Z8049 Family history of malignant neoplasm of other genital organs: Secondary | ICD-10-CM | POA: Diagnosis not present

## 2017-11-13 DIAGNOSIS — I959 Hypotension, unspecified: Secondary | ICD-10-CM | POA: Diagnosis not present

## 2017-11-13 DIAGNOSIS — R197 Diarrhea, unspecified: Secondary | ICD-10-CM | POA: Diagnosis not present

## 2017-11-13 DIAGNOSIS — Z801 Family history of malignant neoplasm of trachea, bronchus and lung: Secondary | ICD-10-CM | POA: Diagnosis not present

## 2017-11-13 DIAGNOSIS — E119 Type 2 diabetes mellitus without complications: Secondary | ICD-10-CM | POA: Diagnosis not present

## 2017-11-13 DIAGNOSIS — Z9221 Personal history of antineoplastic chemotherapy: Secondary | ICD-10-CM | POA: Diagnosis not present

## 2017-11-13 DIAGNOSIS — E876 Hypokalemia: Secondary | ICD-10-CM | POA: Diagnosis not present

## 2017-11-13 DIAGNOSIS — Z8541 Personal history of malignant neoplasm of cervix uteri: Secondary | ICD-10-CM | POA: Diagnosis not present

## 2017-11-13 DIAGNOSIS — R7989 Other specified abnormal findings of blood chemistry: Secondary | ICD-10-CM | POA: Diagnosis not present

## 2017-11-13 DIAGNOSIS — Z66 Do not resuscitate: Secondary | ICD-10-CM | POA: Diagnosis not present

## 2017-11-13 DIAGNOSIS — I083 Combined rheumatic disorders of mitral, aortic and tricuspid valves: Secondary | ICD-10-CM | POA: Diagnosis not present

## 2017-11-13 DIAGNOSIS — J690 Pneumonitis due to inhalation of food and vomit: Secondary | ICD-10-CM | POA: Diagnosis not present

## 2017-11-13 DIAGNOSIS — J441 Chronic obstructive pulmonary disease with (acute) exacerbation: Secondary | ICD-10-CM | POA: Diagnosis not present

## 2017-11-13 DIAGNOSIS — I272 Pulmonary hypertension, unspecified: Secondary | ICD-10-CM | POA: Diagnosis not present

## 2017-11-13 DIAGNOSIS — Z923 Personal history of irradiation: Secondary | ICD-10-CM | POA: Diagnosis not present

## 2017-11-13 DIAGNOSIS — I517 Cardiomegaly: Secondary | ICD-10-CM | POA: Diagnosis not present

## 2017-11-13 DIAGNOSIS — E8779 Other fluid overload: Secondary | ICD-10-CM | POA: Diagnosis not present

## 2017-11-13 DIAGNOSIS — Z951 Presence of aortocoronary bypass graft: Secondary | ICD-10-CM | POA: Diagnosis not present

## 2017-11-13 DIAGNOSIS — I2510 Atherosclerotic heart disease of native coronary artery without angina pectoris: Secondary | ICD-10-CM | POA: Diagnosis not present

## 2017-11-13 DIAGNOSIS — Z7984 Long term (current) use of oral hypoglycemic drugs: Secondary | ICD-10-CM | POA: Diagnosis not present

## 2017-11-13 DIAGNOSIS — R0902 Hypoxemia: Secondary | ICD-10-CM | POA: Diagnosis not present

## 2017-11-13 DIAGNOSIS — Z7982 Long term (current) use of aspirin: Secondary | ICD-10-CM | POA: Diagnosis not present

## 2017-11-13 DIAGNOSIS — R05 Cough: Secondary | ICD-10-CM | POA: Diagnosis not present

## 2017-11-13 DIAGNOSIS — Z8551 Personal history of malignant neoplasm of bladder: Secondary | ICD-10-CM | POA: Diagnosis not present

## 2017-11-13 DIAGNOSIS — Z87891 Personal history of nicotine dependence: Secondary | ICD-10-CM | POA: Diagnosis not present

## 2017-11-13 DIAGNOSIS — Z79899 Other long term (current) drug therapy: Secondary | ICD-10-CM | POA: Diagnosis not present

## 2017-11-13 DIAGNOSIS — R9431 Abnormal electrocardiogram [ECG] [EKG]: Secondary | ICD-10-CM | POA: Diagnosis not present

## 2017-11-13 DIAGNOSIS — I509 Heart failure, unspecified: Secondary | ICD-10-CM | POA: Diagnosis not present

## 2017-11-15 ENCOUNTER — Other Ambulatory Visit: Payer: Self-pay

## 2017-11-15 LAB — TYPE AND SCREEN
ABO/RH(D): O NEG
Antibody Screen: NEGATIVE
UNIT DIVISION: 0

## 2017-11-15 LAB — BPAM RBC
Blood Product Expiration Date: 201910162359
Unit Type and Rh: 9500

## 2017-11-15 NOTE — Patient Outreach (Signed)
[facility]) Care Management  11/15/2017  Tonya Valdez [birth year] [id number]  77 year old female outreached by [place] services for a 30 day post discharge medication review.  PMHx includes, but not limited to, coronary arterty disease, systolic heart failure, hypertension, carotid stenosis, non-small cell lung cancer, bladder cancer, COPD and diabetes.   Successful outreach attempt to [name].  HIPAA identifiers verified.  Patient requested that I call her tomorrow to complete her medication review.  Plan: Outreach attempt on 9/24.   Joetta Manners, PharmD Clinical Pharmacist [place] [phone]

## 2017-11-16 ENCOUNTER — Other Ambulatory Visit: Payer: Self-pay

## 2017-11-16 ENCOUNTER — Telehealth: Payer: Self-pay | Admitting: Gastroenterology

## 2017-11-16 ENCOUNTER — Ambulatory Visit: Payer: Self-pay

## 2017-11-16 DIAGNOSIS — E119 Type 2 diabetes mellitus without complications: Secondary | ICD-10-CM | POA: Diagnosis not present

## 2017-11-16 DIAGNOSIS — R58 Hemorrhage, not elsewhere classified: Secondary | ICD-10-CM | POA: Diagnosis not present

## 2017-11-16 DIAGNOSIS — R0902 Hypoxemia: Secondary | ICD-10-CM | POA: Diagnosis not present

## 2017-11-16 DIAGNOSIS — R918 Other nonspecific abnormal finding of lung field: Secondary | ICD-10-CM | POA: Diagnosis not present

## 2017-11-16 DIAGNOSIS — I509 Heart failure, unspecified: Secondary | ICD-10-CM | POA: Diagnosis not present

## 2017-11-16 DIAGNOSIS — J441 Chronic obstructive pulmonary disease with (acute) exacerbation: Secondary | ICD-10-CM | POA: Diagnosis not present

## 2017-11-16 DIAGNOSIS — C787 Secondary malignant neoplasm of liver and intrahepatic bile duct: Secondary | ICD-10-CM | POA: Diagnosis not present

## 2017-11-16 DIAGNOSIS — C679 Malignant neoplasm of bladder, unspecified: Secondary | ICD-10-CM | POA: Diagnosis not present

## 2017-11-16 DIAGNOSIS — J189 Pneumonia, unspecified organism: Secondary | ICD-10-CM | POA: Diagnosis not present

## 2017-11-16 DIAGNOSIS — I272 Pulmonary hypertension, unspecified: Secondary | ICD-10-CM | POA: Diagnosis not present

## 2017-11-16 DIAGNOSIS — Z86010 Personal history of colonic polyps: Secondary | ICD-10-CM | POA: Diagnosis not present

## 2017-11-16 DIAGNOSIS — J90 Pleural effusion, not elsewhere classified: Secondary | ICD-10-CM | POA: Diagnosis not present

## 2017-11-16 DIAGNOSIS — C7801 Secondary malignant neoplasm of right lung: Secondary | ICD-10-CM | POA: Diagnosis not present

## 2017-11-16 DIAGNOSIS — J439 Emphysema, unspecified: Secondary | ICD-10-CM | POA: Diagnosis not present

## 2017-11-16 DIAGNOSIS — K625 Hemorrhage of anus and rectum: Secondary | ICD-10-CM | POA: Diagnosis not present

## 2017-11-16 DIAGNOSIS — R748 Abnormal levels of other serum enzymes: Secondary | ICD-10-CM | POA: Diagnosis not present

## 2017-11-16 DIAGNOSIS — Z8541 Personal history of malignant neoplasm of cervix uteri: Secondary | ICD-10-CM | POA: Diagnosis not present

## 2017-11-16 DIAGNOSIS — C3490 Malignant neoplasm of unspecified part of unspecified bronchus or lung: Secondary | ICD-10-CM | POA: Diagnosis not present

## 2017-11-16 DIAGNOSIS — Z87891 Personal history of nicotine dependence: Secondary | ICD-10-CM | POA: Diagnosis not present

## 2017-11-16 DIAGNOSIS — Z515 Encounter for palliative care: Secondary | ICD-10-CM | POA: Diagnosis not present

## 2017-11-16 DIAGNOSIS — D500 Iron deficiency anemia secondary to blood loss (chronic): Secondary | ICD-10-CM | POA: Diagnosis not present

## 2017-11-16 DIAGNOSIS — L89892 Pressure ulcer of other site, stage 2: Secondary | ICD-10-CM | POA: Diagnosis not present

## 2017-11-16 DIAGNOSIS — Z8551 Personal history of malignant neoplasm of bladder: Secondary | ICD-10-CM | POA: Diagnosis not present

## 2017-11-16 DIAGNOSIS — K922 Gastrointestinal hemorrhage, unspecified: Secondary | ICD-10-CM | POA: Diagnosis not present

## 2017-11-16 DIAGNOSIS — Z85118 Personal history of other malignant neoplasm of bronchus and lung: Secondary | ICD-10-CM | POA: Diagnosis not present

## 2017-11-16 DIAGNOSIS — I2510 Atherosclerotic heart disease of native coronary artery without angina pectoris: Secondary | ICD-10-CM | POA: Diagnosis not present

## 2017-11-16 DIAGNOSIS — Z951 Presence of aortocoronary bypass graft: Secondary | ICD-10-CM | POA: Diagnosis not present

## 2017-11-16 DIAGNOSIS — D62 Acute posthemorrhagic anemia: Secondary | ICD-10-CM | POA: Diagnosis not present

## 2017-11-16 DIAGNOSIS — K921 Melena: Secondary | ICD-10-CM | POA: Diagnosis not present

## 2017-11-16 DIAGNOSIS — I110 Hypertensive heart disease with heart failure: Secondary | ICD-10-CM | POA: Diagnosis not present

## 2017-11-16 DIAGNOSIS — Z7982 Long term (current) use of aspirin: Secondary | ICD-10-CM | POA: Diagnosis not present

## 2017-11-16 DIAGNOSIS — J9601 Acute respiratory failure with hypoxia: Secondary | ICD-10-CM | POA: Diagnosis not present

## 2017-11-16 DIAGNOSIS — Z7984 Long term (current) use of oral hypoglycemic drugs: Secondary | ICD-10-CM | POA: Diagnosis not present

## 2017-11-16 DIAGNOSIS — Z66 Do not resuscitate: Secondary | ICD-10-CM | POA: Diagnosis not present

## 2017-11-16 DIAGNOSIS — I959 Hypotension, unspecified: Secondary | ICD-10-CM | POA: Diagnosis not present

## 2017-11-16 NOTE — Telephone Encounter (Signed)
Called daughter and discussed.  Patient was discharged home from [facility] on November 20, 2017 after she was admitted with hypoxemia post colonoscopy.  According to daughter, discharging physician recommended for needed to stay overnight as her oxygen was not going to be delivered onto the next day but patient insisted on getting discharged early.  She developed shortness of breath and also started having blood per rectum on September 21, she is currently admitted at [facility], [facility] and is in stable condition.  Juliann Pulse, daughter will call and update with any changes.  Patient is planning to relocate to [place] to stay with her son when she is discharged from the hospital as she has more support available there.

## 2017-11-16 NOTE — Patient Outreach (Signed)
[facility]) Care Management  11/16/2017  CHEROKEE BOCCIO February 22, 1941 [id number]  77 year old female outreached by [place] services for a 30 day post discharge medication review.  PMHx includes, but not limited to, coronary arterty disease, systolic heart failure, hypertension, carotid stenosis, non-small cell lung cancer, bladder cancer, COPD and diabetes.   Called Ms. Dabbs's listed number and spoke with her daughter, Clide Deutscher.  HIPAA identifiers verified.    Ms. Ronnald Ramp states that her mom is back in the hospital after leaving AMA yesterday.  She was home for about 7 hours and they had to call EMS when she started hemorrhaging at about 3 am.  Daughter reports that her mom is hospitalized in [place], [place] (where daughter lives.)  Plan: Make no further attempts to complete discharge medication review at this time.  Joetta Manners, PharmD Clinical Pharmacist [place] [phone]  .

## 2017-11-16 NOTE — Telephone Encounter (Signed)
Patients daughter called to cancel pts follow up for tomorrow 9.25.19 due to pt being in and out of the hosp ever since her procedure last Tuesday on 9.17.19. Patient daughter wanted to notify Dr.Nandigam that pt is currently in hosp for significant blood loss from rectum. FYI The daughter Tye [place] also states pt will probably be moving to [place] so she will unlikely reschedule.

## 2017-11-17 ENCOUNTER — Ambulatory Visit: Payer: PPO | Admitting: Gastroenterology

## 2017-11-17 ENCOUNTER — Telehealth: Payer: Self-pay | Admitting: *Deleted

## 2017-11-17 ENCOUNTER — Other Ambulatory Visit: Payer: Self-pay

## 2017-11-17 ENCOUNTER — Ambulatory Visit: Payer: PPO | Admitting: Internal Medicine

## 2017-11-17 DIAGNOSIS — Z860100 Personal history of colon polyps, unspecified: Secondary | ICD-10-CM

## 2017-11-17 NOTE — Patient Outreach (Signed)
[facility]) Care Management  11/17/2017  Tonya Valdez [facility] 1940/11/03 [id number]  Transition of care  Referral date: 11/17/17 Referral source: discharged from an inpatient admission from [facility] on 11/15/17. Insurance: health team advantage    Transition of care will be completed by primary care provider office who will refer to [facility] care management if needed.   PLAN: RNCM will close patient due to patient being enrolled in an external program.   Quinn Plowman RN,BSN,CCM [facility] Telephonic  [phone]

## 2017-11-17 NOTE — Telephone Encounter (Signed)
ROI faxed to [facility]; release [phone]

## 2017-11-18 ENCOUNTER — Inpatient Hospital Stay: Payer: PPO | Admitting: Internal Medicine

## 2017-11-22 ENCOUNTER — Encounter

## 2017-11-23 ENCOUNTER — Other Ambulatory Visit: Payer: Self-pay

## 2017-11-23 NOTE — Patient Outreach (Signed)
[facility]) Care Management  11/23/2017  Racquel Arkin [facility] 28-Feb-1940 [id number]  7year oldfemaleoutreached by [place] services for a 30 day post discharge medication review. PMHx includes, but not limited to, coronary arterty disease, systolic heart failure, hypertension, carotid stenosis, non-small cell lung cancer, bladder cancer, COPD and diabetes.  Unsuccessful outreach attempt #1. Left HIPAA compliant voice message requesting a return call.  Plan: Outreach attempt #2 in 3-4 business days.  Joetta Manners, PharmD Clinical Pharmacist [place] [phone]

## 2017-11-23 DEATH — deceased

## 2017-11-26 ENCOUNTER — Ambulatory Visit: Payer: PPO

## 2018-01-13 ENCOUNTER — Ambulatory Visit: Payer: PPO | Admitting: Internal Medicine

## 2018-11-26 IMAGING — DX DG CHEST 1V PORT
1 series · 1 of 1 positions shown · non-contrast
Comparison: CT scan of the chest June 18, 2016

CLINICAL DATA: Hypoxia

EXAM:
PORTABLE CHEST 1 VIEW

[chest ap]
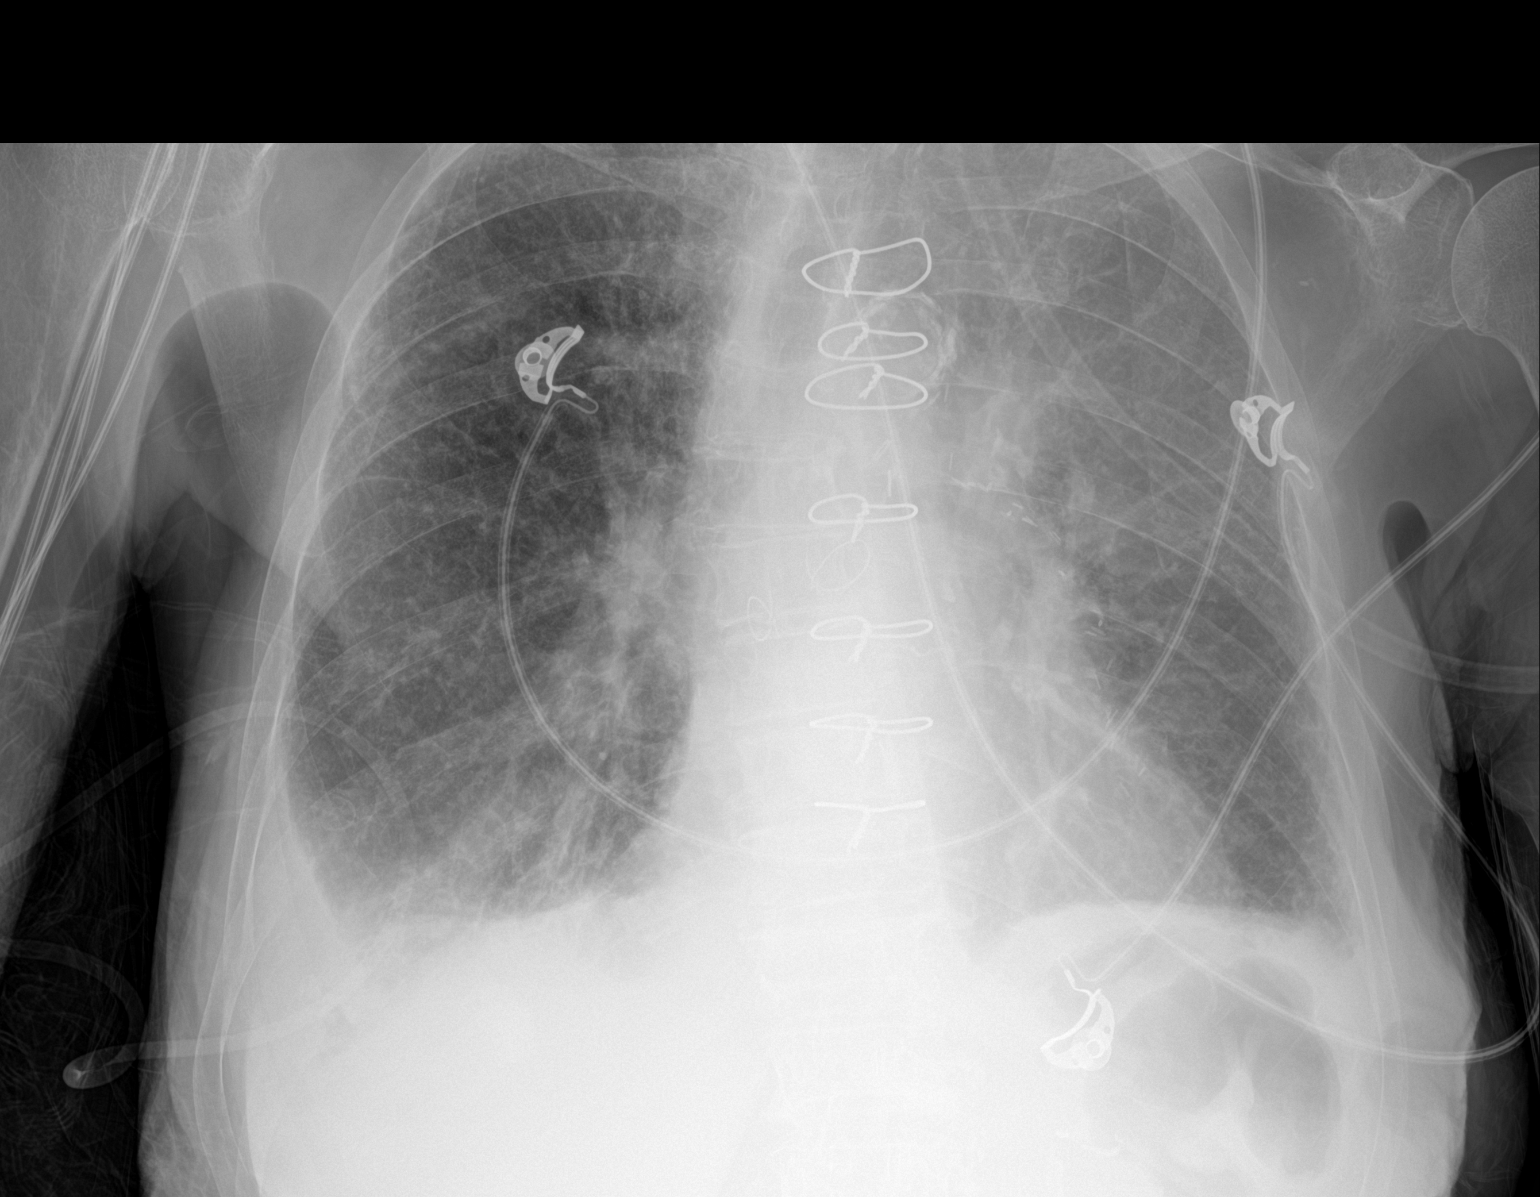

[1 of 1 positions shown; findings below may reference images not displayed]

FINDINGS: The lungs are well-expanded. The interstitial markings are increased
bilaterally. There is a small right and trace left pleural effusion.
There is hazy increased density in the left upper lobe. The heart is
normal in size. The pulmonary vascularity is not clearly engorged.
The patient has undergone previous median sternotomy. There is
calcification in the wall of the aortic arch.
IMPRESSION: COPD. Small bilateral pleural effusions greatest on the right.
Possible atelectasis in the right upper lobe. Interstitial marking
increased diffusely may reflect noncardiac pulmonary edema or
interstitial pneumonia. When the patient can tolerate the procedure,
a PA and lateral chest x-ray would be useful.

Thoracic aortic atherosclerosis.
